# Patient Record
Sex: Male | Born: 1945 | Race: White | Hispanic: No | Marital: Married | Smoking: Former smoker
Health system: Southern US, Community
[De-identification: ages and names within clinical notes are randomized; demographics above are authoritative.]

## PROBLEM LIST (undated history)

## (undated) DIAGNOSIS — E785 Hyperlipidemia, unspecified: Secondary | ICD-10-CM

## (undated) DIAGNOSIS — N200 Calculus of kidney: Secondary | ICD-10-CM

## (undated) DIAGNOSIS — Z87442 Personal history of urinary calculi: Secondary | ICD-10-CM

## (undated) DIAGNOSIS — F32A Depression, unspecified: Secondary | ICD-10-CM

## (undated) DIAGNOSIS — F419 Anxiety disorder, unspecified: Secondary | ICD-10-CM

## (undated) HISTORY — PX: HEMORRHOID SURGERY: SHX153

## (undated) HISTORY — PX: VARICOSE VEIN SURGERY: SHX832

---

## 2007-10-27 DIAGNOSIS — R7301 Impaired fasting glucose: Secondary | ICD-10-CM | POA: Insufficient documentation

## 2008-05-13 DIAGNOSIS — M5030 Other cervical disc degeneration, unspecified cervical region: Secondary | ICD-10-CM

## 2008-05-13 HISTORY — DX: Other cervical disc degeneration, unspecified cervical region: M50.30

## 2009-11-20 DIAGNOSIS — E782 Mixed hyperlipidemia: Secondary | ICD-10-CM | POA: Insufficient documentation

## 2009-11-20 DIAGNOSIS — E785 Hyperlipidemia, unspecified: Secondary | ICD-10-CM

## 2009-11-20 HISTORY — DX: Hyperlipidemia, unspecified: E78.5

## 2011-05-22 DIAGNOSIS — N200 Calculus of kidney: Secondary | ICD-10-CM

## 2011-05-22 HISTORY — DX: Calculus of kidney: N20.0

## 2012-06-09 DIAGNOSIS — I4510 Unspecified right bundle-branch block: Secondary | ICD-10-CM

## 2012-06-09 HISTORY — DX: Unspecified right bundle-branch block: I45.10

## 2016-02-01 ENCOUNTER — Encounter: Payer: Self-pay | Admitting: *Deleted

## 2016-02-01 ENCOUNTER — Emergency Department
Admission: EM | Admit: 2016-02-01 | Discharge: 2016-02-01 | Disposition: A | Discharge status: Home / Self Care | Payer: Medicare Other | Source: Home / Self Care | Attending: Family Medicine | Admitting: Family Medicine

## 2016-02-01 DIAGNOSIS — W5501XA Bitten by cat, initial encounter: Secondary | ICD-10-CM | POA: Diagnosis not present

## 2016-02-01 DIAGNOSIS — Z23 Encounter for immunization: Secondary | ICD-10-CM

## 2016-02-01 DIAGNOSIS — S0195XA Open bite of unspecified part of head, initial encounter: Secondary | ICD-10-CM | POA: Diagnosis not present

## 2016-02-01 DIAGNOSIS — S0185XA Open bite of other part of head, initial encounter: Secondary | ICD-10-CM

## 2016-02-01 HISTORY — DX: Calculus of kidney: N20.0

## 2016-02-01 HISTORY — DX: Hyperlipidemia, unspecified: E78.5

## 2016-02-01 MED ORDER — TETANUS-DIPHTH-ACELL PERTUSSIS 5-2.5-18.5 LF-MCG/0.5 IM SUSP
0.5000 mL | Freq: Once | INTRAMUSCULAR | Status: AC
  Administered 2016-02-01: 0.5 mL via INTRAMUSCULAR

## 2016-02-01 MED ORDER — AMOXICILLIN-POT CLAVULANATE 875-125 MG PO TABS: 1.0000 | ORAL_TABLET | Freq: Two times a day (BID) | ORAL | Status: DC

## 2016-02-01 NOTE — ED Notes (Addendum)
Pt reports his own cat bit his left cheek this morning while he was petting her. He cleaned with alcohol and applied neosporin. Denies pain. Rabies vaccine proof provided. TDaP 05/24/2009

## 2016-02-01 NOTE — Discharge Instructions (Signed)
Please keep wound clean with warm water and soap.  You may apply over the counter antibiotic ointment.  Be sure to take your antibiotics as prescribed to help prevent infection.  Please follow up with your primary care provider or return to urgent care if signs of infection including increased pain, redness, swelling, draining pus, or fever develops.  See below for further instructions on animal bites.    Animal Bite Animal bites can range from mild to serious. An animal bite can result in a scratch on the skin, a deep open cut, a puncture of the skin, a crush injury, or tearing away of the skin or a body part. A small bite from a house pet will usually not cause serious problems. However, some animal bites can become infected or injure a bone or other tissue.  Bites from certain animals can be more dangerous because of the risk of spreading rabies, which is a serious viral infection. This risk is higher with bites from stray animals or wild animals, such as raccoons, foxes, skunks, and bats. Dogs are responsible for most animal bites. Children are bitten more often than adults. SYMPTOMS  Common symptoms of an animal bite include:   Pain.   Bleeding.   Swelling.   Bruising.  DIAGNOSIS  This condition may be diagnosed based on a physical exam and medical history. Your health care provider will examine the wound and ask for details about the animal and how the bite happened. You may also have tests, such as:   Blood tests to check for infection or to determine if surgery is needed.  X-rays to check for damage to bones or joints.  Culture test. This uses a sample of fluid from the wound to check for infection. TREATMENT  Treatment varies depending on the location and type of animal bite and your medical history. Treatment may include:   Wound care. This often includes cleaning the wound, flushing the wound with saline solution, and applying a bandage (dressing). Sometimes, the wound is  left open to heal because of the high risk of infection. However, in some cases, the wound may be closed with stitches (sutures), staples, skin glue, or adhesive strips.   Antibiotic medicine.   Tetanus shot.   Rabies treatment if the animal could have rabies.  In some cases, bites that have become infected may require IV antibiotics and surgical treatment in the hospital.  [place]  Follow instructions from your health care provider about how to take care of your wound. Make sure you:  Wash your hands with soap and water before you change your dressing. If soap and water are not available, use hand sanitizer.  Change your dressing as told by your health care provider.  Leave sutures, skin glue, or adhesive strips in place. These skin closures may need to be in place for 2 weeks or longer. If adhesive strip edges start to loosen and curl up, you may trim the loose edges. Do not remove adhesive strips completely unless your health care provider tells you to do that.  Check your wound every day for signs of infection. Watch for:   Increasing redness, swelling, or pain.   Fluid, blood, or pus.  General Instructions  Take or apply over-the-counter and prescription medicines only as told by your health care provider.   If you were prescribed an antibiotic, take or apply it as told by your health care provider. Do not stop using the antibiotic even if your  condition improves.   Keep the injured area raised (elevated) above the level of your heart while you are sitting or lying down, if this is possible.   If directed, apply ice to the injured area.   Put ice in a plastic bag.   Place a towel between your skin and the bag.   Leave the ice on for 20 minutes, 2-3 times per day.   Keep all follow-up visits as told by your health care provider. This is important.  SEEK MEDICAL CARE IF:  You have increasing redness, swelling, or pain at  the site of your wound.   You have a general feeling of sickness (malaise).   You feel nauseous or you vomit.   You have pain that does not get better.  SEEK IMMEDIATE MEDICAL CARE IF:  You have a red streak extending away from your wound.   You have fluid, blood, or pus coming from your wound.   You have a fever or chills.   You have trouble moving your injured area.   You have numbness or tingling extending beyond the wound.   This information is not intended to replace advice given to you by your health care provider. Make sure you discuss any questions you have with your health care provider.   Document Released: 07/09/2011 Document Revised: 07/12/2015 Document Reviewed: 03/08/2015 Elsevier Interactive Patient Education [employer].

## 2016-02-01 NOTE — ED Provider Notes (Signed)
CSN: [id number]     Arrival date & time 02/01/16  [id number] History   First MD Initiated Contact with Patient 02/01/16 [phone]     Chief Complaint  Patient presents with  . Animal Bite   (Consider location/radiation/quality/duration/timing/severity/associated sxs/prior Treatment) HPI The pt is a 70yo male presenting to [facility] with c/o cat bite to the Left side of his face that occurred this morning just PTA.  Pt states he was petting his cat when it bit him.  Cat is UTD on rabies vaccine.  Pt's last tetanus was in 2010.  Pt cleaned the wound with peroxide and applied antibiotic ointment PTA. Bleeding controlled PTA. Reports minimal pain. No other injuries. He is not on blood thinners.   Past Medical History  Diagnosis Date  . Hyperlipidemia   . Kidney stone    Past Surgical History  Procedure Laterality Date  . Hemorrhoid surgery     History reviewed. No pertinent family history. Social History  Substance Use Topics  . Smoking status: Former Research scientist (life sciences)  . Smokeless tobacco: Never Used  . Alcohol Use: No    Review of Systems  Musculoskeletal: Negative for myalgias and arthralgias.  Skin: Positive for wound. Negative for color change.  Neurological: Negative for weakness and numbness.    Allergies  Review of patient's allergies indicates no known allergies.  Home Medications   Prior to Admission medications   Medication Sig Start Date End Date Taking? Authorizing Provider  FLUoxetine (PROZAC) 10 MG tablet Take 10 mg by mouth daily.   Yes Historical Provider, MD  simvastatin (ZOCOR) 40 MG tablet Take 40 mg by mouth daily.   Yes Historical Provider, MD  Tamsulosin HCl (FLOMAX PO) Take by mouth. PRN KIDNEY STONES   Yes Historical Provider, MD  amoxicillin-clavulanate (AUGMENTIN) 875-125 MG tablet Take 1 tablet by mouth 2 (two) times daily. For 5 days 02/01/16   Noland Fordyce, PA-C   Meds Ordered and Administered this Visit   Medications  Tdap (BOOSTRIX) injection 0.5 mL (0.5 mLs  Intramuscular Given 02/01/16 0904)    BP 121/73 mmHg  Pulse 57  Temp(Src) 97.7 F (36.5 C) (Oral)  Ht 5\' 10"  (1.778 m)  Wt 215 lb (97.523 kg)  BMI 30.85 kg/m2  SpO2 96% No data found.   Physical Exam  Constitutional: He is oriented to person, place, and time. He appears well-developed and well-nourished.  HENT:  Head: Normocephalic. Head is with laceration.    2cm superficial laceration to Left side of face. Bleeding controlled. Antibiotic ointment overtop wound. No foreign bodies seen or palpated.   Eyes: EOM are normal.  Neck: Normal range of motion.  Cardiovascular: Normal rate.   Pulmonary/Chest: Effort normal.  Musculoskeletal: Normal range of motion.  Neurological: He is alert and oriented to person, place, and time.  Skin: Skin is warm and dry.  Psychiatric: He has a normal mood and affect. His behavior is normal.  Nursing note and vitals reviewed.   ED Course  Procedures (including critical care time)  Labs Review Labs Reviewed - No data to display  Imaging Review No results found.  Wound appears to be well cleaned and pt had already applied antibiotic ointment. No indication for wound closure as bite mark is superficial.   MDM   1. Cat bite of face, initial encounter     Cat bite to Left side of face. Tdap updated in UC.   Due to nature of laceration, will prescribe Augmentin for 5 days. Wound care instructions provided. F/u with  PCP or [facility] if signs of infection or wound not improving in 4-5 days. Patient verbalized understanding and agreement with treatment plan.     Noland Fordyce, PA-C 02/01/16 [phone]

## 2016-02-04 ENCOUNTER — Telehealth: Payer: Self-pay | Admitting: Emergency Medicine

## 2017-05-14 LAB — HM COLONOSCOPY

## 2018-03-02 ENCOUNTER — Emergency Department
Admission: EM | Admit: 2018-03-02 | Discharge: 2018-03-02 | Disposition: A | Discharge status: Home / Self Care | Payer: Medicare HMO | Source: Home / Self Care

## 2018-03-02 ENCOUNTER — Emergency Department (INDEPENDENT_AMBULATORY_CARE_PROVIDER_SITE_OTHER): Payer: Medicare HMO

## 2018-03-02 ENCOUNTER — Other Ambulatory Visit: Payer: Self-pay

## 2018-03-02 ENCOUNTER — Encounter: Payer: Self-pay | Admitting: *Deleted

## 2018-03-02 DIAGNOSIS — M25511 Pain in right shoulder: Secondary | ICD-10-CM | POA: Diagnosis not present

## 2018-03-02 DIAGNOSIS — S46001A Unspecified injury of muscle(s) and tendon(s) of the rotator cuff of right shoulder, initial encounter: Secondary | ICD-10-CM | POA: Diagnosis not present

## 2018-03-02 MED ORDER — HYDROCODONE-ACETAMINOPHEN 5-325 MG PO TABS: 1.0000 | ORAL_TABLET | ORAL | 0 refills | Status: DC | PRN

## 2018-03-02 NOTE — ED Provider Notes (Signed)
[facility]    CSN: [id number] Arrival date & time: 03/02/18  0737     History   Chief Complaint Chief Complaint  Patient presents with  . Arm Injury    HPI Joshua [name] is a 72 y.o. male.   The history is provided by the patient. No language interpreter was used.  Arm Injury  Location:  Shoulder and arm Shoulder location:  R shoulder Arm location:  R arm Injury: yes   Time since incident:  2 days Mechanism of injury comment:  Pt was pulling off his shirt and heard a pop and a tearing feeling Pain details:    Quality:  Aching   Radiates to:  Does not radiate   Severity:  Moderate   Onset quality:  Gradual   Duration:  2 days   Timing:  Constant   Progression:  Worsening Dislocation: no   Foreign body present:  No foreign bodies Prior injury to area:  No Relieved by:  Nothing Worsened by:  Nothing Ineffective treatments:  None tried Risk factors: no recent illness     Past Medical History:  Diagnosis Date  . Hyperlipidemia   . Kidney stone     There are no active problems to display for this patient.   Past Surgical History:  Procedure Laterality Date  . HEMORRHOID SURGERY         Home Medications    Prior to Admission medications   Medication Sig Start Date End Date Taking? Authorizing Provider  FLUoxetine (PROZAC) 10 MG tablet Take 10 mg by mouth daily.    [provider]  simvastatin (ZOCOR) 40 MG tablet Take 40 mg by mouth daily.    [provider]  Tamsulosin HCl (FLOMAX PO) Take by mouth. PRN KIDNEY STONES    [provider]    Family History History reviewed. No pertinent family history.  Social History Social History   Tobacco Use  . Smoking status: Former Research scientist (life sciences)  . Smokeless tobacco: Never Used  Substance Use Topics  . Alcohol use: No  . Drug use: No     Allergies   Patient has no known allergies.   Review of Systems Review of Systems  All other systems reviewed and are  negative.    Physical Exam Triage Vital Signs ED Triage Vitals  Enc Vitals Group     BP 03/02/18 0932 (!) 143/86     Pulse Rate 03/02/18 0932 (!) 57     Resp 03/02/18 0932 14     Temp --      Temp src --      SpO2 03/02/18 0932 97 %     Weight 03/02/18 0933 230 lb (104.3 kg)     Height --      Head Circumference --      Peak Flow --      Pain Score 03/02/18 0933 9     Pain Loc --      Pain Edu? --      Excl. in [place]? --    No data found.  Updated Vital Signs BP (!) 143/86 (BP Location: Right Arm)   Pulse (!) 57   Resp 14   Wt 230 lb (104.3 kg)   SpO2 97%   BMI 33.00 kg/m   Visual Acuity Right Eye Distance:   Left Eye Distance:   Bilateral Distance:    Right Eye Near:   Left Eye Near:    Bilateral Near:     Physical Exam  Constitutional: He appears well-developed and well-nourished.  HENT:  Head: Normocephalic.  Cardiovascular: Normal rate.  Pulmonary/Chest: Effort normal.  Musculoskeletal: He exhibits tenderness.  Pain with moving arm upward, pain with abduction,   Tender triceps area   Neurological: He is alert.  Skin: Skin is warm.  Psychiatric: He has a normal mood and affect.  Nursing note and vitals reviewed.    UC Treatments / Results  Labs (all labs ordered are listed, but only abnormal results are displayed) Labs Reviewed - No data to display  EKG None Radiology Dg Shoulder Right  Result Date: 03/02/2018 CLINICAL DATA:  Injury, pain.  Felt a pop. EXAM: RIGHT SHOULDER - 2+ VIEW COMPARISON:  Right humerus series performed today. 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 Small subacromial spur. Glenohumeral joint is maintained. No fracture, subluxation or dislocation. 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 No acute bony abnormality. Electronically Signed   By: Rolm Baptise M.D.   On: 03/02/2018 10:33   Dg Humerus Right  Result Date: 03/02/2018 CLINICAL DATA:  Right shoulder injury, pain.  Heard pop.  EXAM: RIGHT HUMERUS - 2+ VIEW COMPARISON:  Right shoulder series performed today. FINDINGS: There is no evidence of fracture or other focal bone lesions. Soft tissues are unremarkable. IMPRESSION: Negative. Electronically Signed   By: Rolm Baptise M.D.   On: 03/02/2018 10:34    Procedures Procedures (including critical care time)  Medications Ordered in UC Medications - No data to display   Initial Impression / Assessment and Plan / UC Course  I have reviewed the triage vital signs and the nursing notes.  Pertinent labs & imaging results that were available during my care of the patient were reviewed by me and considered in my medical decision making (see chart for details).     MDM  I suspect muscle tear.  Pt placed in a  Sling. Pt scheduled to see Sports medicine MD for evaltuation  ICe, and ibuprofen  Final Clinical Impressions(s) / UC Diagnoses   Final diagnoses:  Injury of right rotator cuff, initial encounter    ED Discharge Orders    None    Sling Ibuprofen   /avs   Controlled Substance Prescriptions [place] Controlled Substance Registry consulted? Not Applicable   Fransico Meadow, [place] 03/02/18 1054

## 2018-03-02 NOTE — ED Triage Notes (Signed)
Patient c/o pulling his shirt off over his head 2 days ago and felt/hears a pop. Pain ever since. Taken tylenol, IBF and used ice without much relief.

## 2018-03-03 ENCOUNTER — Ambulatory Visit (INDEPENDENT_AMBULATORY_CARE_PROVIDER_SITE_OTHER): Payer: Medicare HMO | Admitting: Family Medicine

## 2018-03-03 ENCOUNTER — Encounter: Payer: Self-pay | Admitting: Family Medicine

## 2018-03-03 VITALS — BP 133/74 | HR 67 | Ht 71.0 in | Wt 232.0 lb

## 2018-03-03 DIAGNOSIS — M7581 Other shoulder lesions, right shoulder: Secondary | ICD-10-CM | POA: Diagnosis not present

## 2018-03-03 DIAGNOSIS — Z860101 Personal history of adenomatous and serrated colon polyps: Secondary | ICD-10-CM

## 2018-03-03 DIAGNOSIS — Z86010 Personal history of colonic polyps: Secondary | ICD-10-CM

## 2018-03-03 HISTORY — DX: Personal history of adenomatous and serrated colon polyps: Z86.0101

## 2018-03-03 HISTORY — DX: Personal history of colonic polyps: Z86.010

## 2018-03-03 NOTE — Patient Instructions (Addendum)
Thank you for coming in today. Call or go to the ER if you develop a large red swollen joint with extreme pain or oozing puss.   Attend PT.  Recheck in 6 weeks.  Return sooner if needed.    Shoulder Impingement Syndrome Rehab Ask your health care provider which exercises are safe for you. Do exercises exactly as told by your health care provider and adjust them as directed. It is normal to feel mild stretching, pulling, tightness, or discomfort as you do these exercises, but you should stop right away if you feel sudden pain or your pain gets worse.Do not begin these exercises until told by your health care provider. Stretching and range of motion exercise This exercise warms up your muscles and joints and improves the movement and flexibility of your shoulder. This exercise also helps to relieve pain and stiffness. Exercise A: Passive horizontal adduction  1. Sit or stand and pull your left / right elbow across your chest, toward your other shoulder. Stop when you feel a gentle stretch in the back of your shoulder and upper arm. ? Keep your arm at shoulder height. ? Keep your arm as close to your body as you comfortably can. 2. Hold for __________ seconds. 3. Slowly return to the starting position. Repeat __________ times. Complete this exercise __________ times a day. Strengthening exercises These exercises build strength and endurance in your shoulder. Endurance is the ability to use your muscles for a long time, even after they get tired. Exercise B: External rotation, isometric 1. Stand or sit in a doorway, facing the door frame. 2. Bend your left / right elbow and place the back of your wrist against the door frame. Only your wrist should be touching the frame. Keep your upper arm at your side. 3. Gently press your wrist against the door frame, as if you are trying to push your arm away from your abdomen. ? Avoid shrugging your shoulder while you press your hand against the door  frame. Keep your shoulder blade tucked down toward the middle of your back. 4. Hold for __________ seconds. 5. Slowly release the tension, and relax your muscles completely before you do the exercise again. Repeat __________ times. Complete this exercise __________ times a day. Exercise C: Internal rotation, isometric  1. Stand or sit in a doorway, facing the door frame. 2. Bend your left / right elbow and place the inside of your wrist against the door frame. Only your wrist should be touching the frame. Keep your upper arm at your side. 3. Gently press your wrist against the door frame, as if you are trying to push your arm toward your abdomen. ? Avoid shrugging your shoulder while you press your hand against the door frame. Keep your shoulder blade tucked down toward the middle of your back. 4. Hold for __________ seconds. 5. Slowly release the tension, and relax your muscles completely before you do the exercise again. Repeat __________ times. Complete this exercise __________ times a day. Exercise D: Scapular protraction, supine  1. Lie on your back on a firm surface. Hold a __________ weight in your left / right hand. 2. Raise your left / right arm straight into the air so your hand is directly above your shoulder joint. 3. Push the weight into the air so your shoulder lifts off of the surface that you are lying on. Do not move your head, neck, or back. 4. Hold for __________ seconds. 5. Slowly return to the starting position.  Let your muscles relax completely before you repeat this exercise. Repeat __________ times. Complete this exercise __________ times a day. Exercise E: Scapular retraction  1. Sit in a stable chair without armrests, or stand. 2. Secure an exercise band to a stable object in front of you so the band is at shoulder height. 3. Hold one end of the exercise band in each hand. Your palms should face down. 4. Squeeze your shoulder blades together and move your elbows  slightly behind you. Do not shrug your shoulders while you do this. 5. Hold for __________ seconds. 6. Slowly return to the starting position. Repeat __________ times. Complete this exercise __________ times a day. Exercise F: Shoulder extension  1. Sit in a stable chair without armrests, or stand. 2. Secure an exercise band to a stable object in front of you where the band is above shoulder height. 3. Hold one end of the exercise band in each hand. 4. Straighten your elbows and lift your hands up to shoulder height. 5. Squeeze your shoulder blades together and pull your hands down to the sides of your thighs. Stop when your hands are straight down by your sides. Do not let your hands go behind your body. 6. Hold for __________ seconds. 7. Slowly return to the starting position. Repeat __________ times. Complete this exercise __________ times a day. This information is not intended to replace advice given to you by your health care provider. Make sure you discuss any questions you have with your health care provider. Document Released: 10/21/2005 Document Revised: 06/27/2016 Document Reviewed: 09/23/2015 Elsevier Interactive Patient Education  [employer].

## 2018-03-03 NOTE — Progress Notes (Signed)
Subjective:    I'm seeing this patient as a consultation for:  Alyse Low PA-C  CC: Right shoulder pain  HPI: Joshua [name] notes right shoulder pain.  The pain is present in the right upper arm for the last several days.  The pain occurred after he had to start a pull string gasoline engine multiple times over the weekend.  He noted some pain but denies any particular injury.  That evening when he went to take a shower and pull his shirt over his head he felt a pop in his right shoulder.  He notes pain in the right lateral upper arm.  The pain is worse with overhead motion and reaching back and at bedtime.  He denies significant pain weakness or numbness radiating to the right hand.  He was seen in urgent care yesterday where x-rays were largely unremarkable.  He was prescribed Norco which helped a little.  He receives his care for a primary care provider at the [place].  Past medical history, Surgical history, Family history not pertinant except as noted below, Social history, Allergies, and medications have been entered into the medical record, reviewed, and no changes needed.   Review of Systems: No headache, visual changes, nausea, vomiting, diarrhea, constipation, dizziness, abdominal pain, skin rash, fevers, chills, night sweats, weight loss, swollen lymph nodes, body aches, joint swelling, muscle aches, chest pain, shortness of breath, mood changes, visual or auditory hallucinations.   Objective:    Vitals:   03/03/18 0907  BP: 133/74  Pulse: 67   General: Well Developed, well nourished, and in no acute distress.  Neuro/Psych: Alert and oriented x3, extra-ocular muscles intact, able to move all 4 extremities, sensation grossly intact. Skin: Warm and dry, no rashes noted.  Respiratory: Not using accessory muscles, speaking in full sentences, trachea midline.  Cardiovascular: Pulses palpable, no extremity edema. Abdomen: Does not appear distended. MSK:  C-spine: Nontender to midline normal  neck motion. Right shoulder normal-appearing mildly tender at the [facility] joint. Range of motion is intact forward flexion and external rotation. Patient has limited active abduction beyond 100 degrees limited by pain.  Passive range of motion is full. Internal rotation limited to the lumbar spine compared to the thoracic spine contralateral left shoulder. Strength is diminished in abduction and external rotation 4/5 compared to 5/5 left.  Normal internal rotational strength. Positive Hawkins and Neer's test. Positive empty can test. Negative Yergason's and speeds test. Pulses capillary refill and sensation are intact bilateral upper extremities.  Left shoulder normal-appearing nontender full motion normal strength negative impingement testing.  Limited musculoskeletal ultrasound of the left shoulder AC joint mild effusion mild degeneration present. Biceps tendon intact and groove. Subscapularis tendon intact appearing Supraspinatus tendon intact without full-thickness tear present.  Mild increased bursa thickening present. Infraspinatus tendon intact Normal bony structures. Impression subacromial bursitis without full-thickness tear present.  .Procedure: Real-time Ultrasound Guided Injection of left shoulder  Device: GE Logiq E   Images permanently stored and available for review in the ultrasound unit. Verbal informed consent obtained.  Discussed risks and benefits of procedure. Warned about infection bleeding damage to structures skin hypopigmentation and fat atrophy among others. Patient expresses understanding and agreement Time-out conducted.   Noted no overlying erythema, induration, or other signs of local infection.   Skin prepped in a sterile fashion.   Local anesthesia: Topical Ethyl chloride.   With sterile technique and under real time ultrasound guidance:  40mg  kenalog and 59ml marcaine injected easily.   Completed without difficulty  Pain partially  resolved suggesting  accurate placement of the medication.   Advised to call if fevers/chills, erythema, induration, drainage, or persistent bleeding.   Images permanently stored and available for review in the ultrasound unit.  Impression: Technically successful ultrasound guided injection.      No results found for this or any previous visit (from the past 24 hour(s)). Dg Shoulder Right  Result Date: 03/02/2018 CLINICAL DATA:  Injury, pain.  Felt a pop. EXAM: RIGHT SHOULDER - 2+ VIEW COMPARISON:  Right humerus series performed today. 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 Small subacromial spur. Glenohumeral joint is maintained. No fracture, subluxation or dislocation. 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 No acute bony abnormality. Electronically Signed   By: Rolm Baptise M.D.   On: 03/02/2018 10:33   Dg Humerus Right  Result Date: 03/02/2018 CLINICAL DATA:  Right shoulder injury, pain.  Heard pop. EXAM: RIGHT HUMERUS - 2+ VIEW COMPARISON:  Right shoulder series performed today. FINDINGS: There is no evidence of fracture or other focal bone lesions. Soft tissues are unremarkable. IMPRESSION: Negative. Electronically Signed   By: Rolm Baptise M.D.   On: 03/02/2018 10:34    Impression and Recommendations:    Assessment and Plan: 72 y.o. male with right shoulder pain concerning for subacromial bursitis versus partial-thickness rotator cuff tear.  No obvious tear seen on today's limited ultrasound.  Patient had partial response to ultrasound-guided subacromial injection.  Plan for physical therapy and injection as noted above.  Recheck in 6 weeks.  If not better next step would be MRI for surgical planning..   Orders Placed This Encounter  Procedures  . Ambulatory referral to Physical Therapy    Referral Priority:   Routine    Referral Type:   Physical Medicine    Referral Reason:   Specialty Services Required    Requested Specialty:   Physical Therapy   No  orders of the defined types were placed in this encounter.   Discussed warning signs or symptoms. Please see discharge instructions. Patient expresses understanding.   CC:  Dr Nuala Alpha at [facility]

## 2018-03-09 ENCOUNTER — Ambulatory Visit: Payer: Medicare HMO | Admitting: Rehabilitative and Restorative Service Providers"

## 2018-03-09 DIAGNOSIS — M25511 Pain in right shoulder: Secondary | ICD-10-CM

## 2018-03-09 DIAGNOSIS — R293 Abnormal posture: Secondary | ICD-10-CM | POA: Diagnosis not present

## 2018-03-09 DIAGNOSIS — R29898 Other symptoms and signs involving the musculoskeletal system: Secondary | ICD-10-CM

## 2018-03-09 NOTE — Patient Instructions (Signed)
Axial Extension (Chin Tuck)    Pull chin in and lengthen back of neck. Hold __5__ seconds while counting out loud. Repeat __10__ times. Do __several__ sessions per day.  Shoulder Blade Squeeze   Can use swim noodle  Rotate shoulders back, then squeeze shoulder blades down and back. Hold 10 sec Repeat __10__ times. Do __several __ sessions per day.  Upper Back Strength: Lower Trapezius / Rotator Cuff " L's "     Arms in waitress pose, palms up. Press hands back and slide shoulder blades down. Hold for __5__ seconds. Repeat _10___ times. 1-2 times per day.    Scapular Retraction: Elbow Flexion (Standing)  "W's"     With elbows bent to 90, pinch shoulder blades together and rotate arms out, keeping elbows bent. Repeat __10__ times per set. Do __1-2__ sets per session. Do _several ___ sessions per day.  ELBOW: Biceps - Standing    Standing in doorway, place one hand on wall, elbow straight. Lean forward. Hold __20-30_ seconds. _3__ reps per set, _2-3__ sets per day  Scapula Adduction With Pectoralis Stretch: Low - Standing   Shoulders at 45 hands even with shoulders, keeping weight through legs, shift weight forward until you feel pull or stretch through the front of your chest. Hold _30__ seconds. Do _3__ times, _2-4__ times per day.   Scapula Adduction With Pectoralis Stretch: Mid-Range - Standing   Shoulders at 90 elbows even with shoulders, keeping weight through legs, shift weight forward until you feel pull or strength through the front of your chest. Hold __30_ seconds. Do _3__ times, __2-4_ times per day.   Scapula Adduction With Pectoralis Stretch: High - Standing   Shoulders at 120 hands up high on the doorway, keeping weight on feet, shift weight forward until you feel pull or stretch through the front of your chest. Hold _30__ seconds. Do _3__ times, _2-3__ times per day.  TENS UNIT: This is helpful for muscle pain and spasm.   Search and  Purchase a TENS 7000 2nd edition at www.tenspros.com. It should be less than $30.     TENS unit instructions: Do not shower or bathe with the unit on Turn the unit off before removing electrodes or batteries If the electrodes lose stickiness add a drop of water to the electrodes after they are disconnected from the unit and place on plastic sheet. If you continued to have difficulty, call the TENS unit company to purchase more electrodes. Do not apply lotion on the skin area prior to use. Make sure the skin is clean and dry as this will help prolong the life of the electrodes. After use, always check skin for unusual red areas, rash or other skin difficulties. If there are any skin problems, does not apply electrodes to the same area. Never remove the electrodes from the unit by pulling the wires. Do not use the TENS unit or electrodes other than as directed. Do not change electrode placement without consultating your therapist or physician. Keep 2 fingers with between each electrode.   Self massage using a 4 inch plastic ball   [facility] at [place], [place] [address]  [phone] (office) [phone] (fax)

## 2018-03-09 NOTE — Therapy (Signed)
[place], [place], [address] Phone: [phone]   Fax:  [phone]  Physical Therapy Evaluation  Patient Details  Name: Joshua [name] MRN: [id number] Date of Birth: [birth year] Referring Provider: Dr [name]    Encounter Date: 03/09/2018  PT End of Session - 03/09/18 1419    Visit Number  1    Number of Visits  12    Date for PT Re-Evaluation  04/20/18    PT Start Time  1104    PT Stop Time  1210    PT Time Calculation (min)  66 min    Activity Tolerance  Patient tolerated treatment well       Past Medical History:  Diagnosis Date  . Hyperlipidemia   . Kidney stone     Past Surgical History:  Procedure Laterality Date  . HEMORRHOID SURGERY      There were no vitals filed for this visit.   Subjective Assessment - 03/09/18 1113    Subjective  Patient reports that he was starting an engine for a pressure washer - pulling to crank the machine. He noticed pain in the Rt shoulder after he used the pressure washer then rested then he was pulling his shirt over his head when he felt a pop.He has had pain in the Rt shoudler since that time. He was seen by MD and received injection with minimal help. He continues to have pain in the Rt shoulder.     Diagnostic tests  xrays (-); [place] tendonitis     Patient Stated Goals  get rid of pain     Currently in Pain?  Yes    Pain Score  7     Pain Location  Shoulder    Pain Orientation  Right    Pain Descriptors / Indicators  Sharp;Nagging    Pain Radiating Towards  in mid deltoid area     Pain Onset  1 to 4 weeks ago    Pain Frequency  Constant    Aggravating Factors   driving; reaching; lifting arm; sitting in recliner prolonged periods of time     Pain Relieving Factors  nothing          [facility] PT Assessment - 03/09/18 0001      Assessment   Medical Diagnosis  Rt Rotator cuff tendonitis     Referring Provider  Dr [name]     Onset  Date/Surgical Date  03/01/18    Hand Dominance  Right    Next MD Visit  04/09/18    Prior Therapy  none      Precautions   Precautions  None      Balance Screen   Has the patient fallen in the past 6 months  No    Has the patient had a decrease in activity level because of a fear of falling?   No    Is the patient reluctant to leave their home because of a fear of falling?   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     Leisure  yard work; reading; TV; fixing things        Observation/Other Assessments   Focus on Therapeutic Outcomes (FOTO)   45% limitation       Sensation   Additional Comments  WNL's per  pt report       Posture/Postural Control   Posture Comments  head forward; shoudlers rounded and elevated head of the humerus anterior in orientation; scapula abducted and rotated along the thoracic spine       AROM   Right/Left Shoulder  -- paiin with all Rt shd motioints > w/ abd/IR/ER     Right Shoulder Extension  53 Degrees    Right Shoulder Flexion  140 Degrees    Right Shoulder ABduction  143 Degrees    Right Shoulder Internal Rotation  27 Degrees    Right Shoulder External Rotation  90 Degrees    Left Shoulder Extension  50 Degrees    Left Shoulder Flexion  139 Degrees    Left Shoulder ABduction  140 Degrees    Left Shoulder Internal Rotation  43 Degrees    Left Shoulder External Rotation  93 Degrees      Strength   Overall Strength Comments  5/5 Lt UE; Rt except shd flex, abd. ER 4+/5 with pain       Palpation   Spinal mobility  hypomobile lower cervical and thoracic spine with CPA mobs    Palpation comment  muscular tightness Rt > Lt pecs; upper trap; leveator; biceps; deltoid; leveator; teres                 Objective measurements completed on examination: See above findings.      [place] Adult PT Treatment/Exercise - 03/09/18  0001      Self-Care   Self-Care  -- education re sitting postures/modifications       Therapeutic Activites    Therapeutic Activities  -- myofacial ball release work standing       Neuro Re-ed    Neuro Re-ed Details   postural correction with focus on chest lift/scapular retraction      Shoulder Exercises: Standing   Other Standing Exercises  axial extension 10 sec x 5; scap squeeze 10 sec x 5; L's x10; W's x 10       Shoulder Exercises: Stretch   Other Shoulder Stretches  3 way doorway stretch 30 sec x 2 each position     Other Shoulder Stretches  biceps stretch at table 30 sec x 2 Rt       Moist Heat Therapy   Number Minutes Moist Heat  20 Minutes    Moist Heat Location  Shoulder Rt      Electrical Stimulation   Electrical Stimulation Location  Rt shoulder girdle to deltoid    Electrical Stimulation Action  IFC    Electrical Stimulation Parameters  to tolerance     Electrical Stimulation Goals  Pain;Tone             PT Education - 03/09/18 1154    Education provided  Yes    Education Details  HEP myofacail ball release work TENS     Person(s) Educated  Patient    Methods  Explanation;Demonstration;Tactile cues;Verbal cues;Handout    Comprehension  Verbalized understanding;Returned demonstration;Verbal cues required          PT Long Term Goals - 03/09/18 1424      PT LONG TERM GOAL #1   Title  Improve postue and alignment with patient to demonstrate good upright posture with posterior shoulder girdle engaged. 04/20/18    Time  6    Period  Weeks    Status  New      PT LONG TERM GOAL #2   Title  Full pain  free ROM Rt shoulder to enable patient to return to normal functional activities 04/20/18    Time  6    Period  Weeks    Status  New      PT LONG TERM GOAL #3   Title  Decrease frequency, intensity and duration of Rt shoudler pain by 50-75% allowing patient to return to normal functional activities 04/20/18    Time  6    Period  Weeks    Status  New       PT LONG TERM GOAL #4   Title  Independent in HEP 04/20/18    Time  6    Period  Weeks    Status  New      PT LONG TERM GOAL #5   Title  Improve FOTO to </= 30% limitation 04/20/18    Time  6    Period  Weeks    Status  New             Plan - 03/09/18 1419    Clinical Impression Statement  Patient presents with injury to Rt shoulder 03/01/18 when pulling a starter for a power washer. He has had continued pain since the afternoon of the injury with no response to meds; injection or rest. Patient has poor posture and alignment; limited shoulder ROM bilat; pain with resistive testing Rt shoulder; muscular tightness to palpation Rt shoulder girdle; pain at rest; limited functional activitie level. patient will benefit from PT to address problems identified.     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; sedentary posture with poor positions for rest     Clinical Decision Making  Low    Rehab Potential  Good    PT Frequency  2x / week    PT Duration  6 weeks    PT Treatment/Interventions  Patient/family education;ADLs/Self Care Home Management;Cryotherapy;Electrical Stimulation;Iontophoresis 4mg /ml Dexamethasone;Moist Heat;Ultrasound;Dry needling;Manual techniques;Neuromuscular re-education;Therapeutic activities;Therapeutic exercise    PT Next Visit Plan  review HEP; add manual release work through Rt shoulder girdle; snow angel; scapulae down and back; modalities as indicated     Consulted and Agree with Plan of Care  Patient       Patient will benefit from skilled therapeutic intervention in order to improve the following deficits and impairments:  Postural dysfunction, Improper body mechanics, Pain, Increased fascial restricitons, Increased muscle spasms, Decreased strength, Decreased range of motion, Decreased activity tolerance  Visit Diagnosis: Acute pain of right shoulder - Plan: PT plan of  care cert/re-cert  Other symptoms and signs involving the musculoskeletal system - Plan: PT plan of care cert/re-cert  Abnormal posture - Plan: PT plan of care cert/re-cert     Problem List Patient Active Problem List   Diagnosis Date Noted  . History of adenomatous polyp of colon 03/03/2018  . RBBB 06/09/2012  . Nephrolithiasis 05/22/2011  . Other and unspecified hyperlipidemia 11/20/2009  . DDD (degenerative disc disease), cervical 05/13/2008  . Impaired fasting glucose 10/27/2007    Celyn Nilda Simmer PT, MPH  03/09/2018, 2:29 PM  [facility] [place], [place], [address] Phone: [phone]   Fax:  [phone]  Name: Joshua [name] MRN: [id number] Date of Birth: Jul 15, 1946

## 2018-03-13 ENCOUNTER — Ambulatory Visit (INDEPENDENT_AMBULATORY_CARE_PROVIDER_SITE_OTHER): Payer: Medicare HMO | Admitting: Physical Therapy

## 2018-03-13 DIAGNOSIS — M25511 Pain in right shoulder: Secondary | ICD-10-CM

## 2018-03-13 DIAGNOSIS — R29898 Other symptoms and signs involving the musculoskeletal system: Secondary | ICD-10-CM

## 2018-03-13 DIAGNOSIS — R293 Abnormal posture: Secondary | ICD-10-CM | POA: Diagnosis not present

## 2018-03-13 NOTE — Patient Instructions (Addendum)
Thoracic: Stretch - Lean Back (Chair)    Get ON TARGET. Sit on a low firm-backed chair, hands behind head. Elbows leading motion, lean back, arching upper body. Avoid undue pressure or quick stretching. Hold __5_ seconds. Repeat _3__ times. Do _2__ sessions per day.  [place]: Single Arm    Arms near sides, palms up. Press one arm lightly into floor, slide arm out to side and up alongside head. Keep contact with floor throughout motion. At maximal position, lengthen arm. Hold __3_ seconds. Relax. Repeat _5__ times. Slide arm back to start. Repeat with other arm.  Trunk: Rotation    Lie on back on firm, flat surface with knees bent. Keep head and shoulders flat, slowly lower knees to floor. May also do with legs straight. Lift one at a time up and across to touch floor. Hold __10__ seconds. Repeat __3__ times, alternating sides. Do __1__ sessions per day. CAUTION: Movement should be gentle, steady and slow.  Chest / Bicep Stretch    Lace fingers behind back and squeeze shoulder blades together. Slowly raise and straighten arms. Hold __3-5__ seconds. Repeat __3__ times per set.   * Remember to use Lumbar support in cars when driving

## 2018-03-13 NOTE — Therapy (Signed)
[place], [place], [address] Phone: [phone]   Fax:  [phone]  Physical Therapy Treatment  Patient Details  Name: Joshua [name] MRN: [id number] Date of Birth: [birth year] Referring Provider: Dr. [name]    Encounter Date: 03/13/2018  PT End of Session - 03/13/18 1402    Visit Number  2    Number of Visits  12    Date for PT Re-Evaluation  04/20/18    PT Start Time  1402    PT Stop Time  1500    PT Time Calculation (min)  58 min    Activity Tolerance  Patient tolerated treatment well    Behavior During Therapy  [facility] for tasks assessed/performed       Past Medical History:  Diagnosis Date  . Hyperlipidemia   . Kidney stone     Past Surgical History:  Procedure Laterality Date  . HEMORRHOID SURGERY      There were no vitals filed for this visit.  Subjective Assessment - 03/13/18 1405    Subjective  Pt reports he is feeling much better than last visit. He has performing HEP 2x/day and has noticed a difference.   He does chin tucks throughout day. His pain is less often and not as intense. He has been more aware of posture with driving; he is trying not to recline seat in car anymore.     Patient Stated Goals  get rid of pain     Currently in Pain?  No/denies    Pain Score  0-No pain    Pain Orientation  Right         [facility] PT Assessment - 03/13/18 0001      Assessment   Medical Diagnosis  Rt Rotator cuff tendonitis     Referring Provider  Dr. [name]     Onset Date/Surgical Date  03/01/18    Hand Dominance  Right    Next MD Visit  04/09/18        [facility] Adult PT Treatment/Exercise - 03/13/18 0001      Self-Care   Self-Care  Other Self-Care Comments    Other Self-Care Comments   pt educated on safety, application and parameters of TENS.  Pt verbalized understanding.       Shoulder Exercises: Supine   Other Supine Exercises  prolonged snow angel and BUE dynamic snow  angel x 8 reps; RUE single snow angel with arm off edge of table x 3 reps    Other Supine Exercises  Lower trunk rotation with arms in T, to tolerance, x 5 reps each direction      Shoulder Exercises: Seated   Other Seated Exercises  thoracic ext over back of chair, with hands supporting head, x 5 sec x 3 reps      Shoulder Exercises: Standing   Other Standing Exercises  axial extension 10 sec x 5; scap squeeze 10 sec x 5; L's and W's with 3 sec hold x 10       Shoulder Exercises: Stretch   Other Shoulder Stretches  3 way doorway stretch 30 sec x 2 each position - trial of unilateral high position  minor cues for form    Other Shoulder Stretches  biceps stretch at door 30 sec x each arm; shoulder ext with hands laced x 5 sec x 3 reps      Moist Heat Therapy   Number Minutes Moist Heat  15 Minutes    Moist  Heat Location  Shoulder Rt      Electrical Stimulation   Electrical Stimulation Location  Rt shoulder girdle to deltoid    Electrical Stimulation Action  TENS    Electrical Stimulation Parameters  to tolerance     Electrical Stimulation Goals  Tone;Pain      Pt educated on supine to/from sit via log roll. Pt verbalized understanding and returned demo.        PT Education - 03/13/18 1453    Education provided  Yes    Education Details  HEP    Person(s) Educated  Patient    Methods  Explanation;Handout    Comprehension  Verbalized understanding          PT Long Term Goals - 03/13/18 1426      PT LONG TERM GOAL #1   Title  Improve posture and alignment with patient to demonstrate good upright posture with posterior shoulder girdle engaged. 04/20/18    Time  6    Period  Weeks    Status  On-going      PT LONG TERM GOAL #2   Title  Full pain free ROM Rt shoulder to enable patient to return to normal functional activities 04/20/18      PT LONG TERM GOAL #3   Title  Decrease frequency, intensity and duration of Rt shoudler pain by 50-75% allowing patient to return to  normal functional activities 04/20/18    Time  6    Period  Weeks    Status  On-going      PT LONG TERM GOAL #4   Title  Independent in HEP 04/20/18    Time  6    Period  Weeks    Status  On-going      PT LONG TERM GOAL #5   Title  Improve FOTO to </= 30% limitation 04/20/18    Time  6    Period  Weeks    Status  On-going            Plan - 03/13/18 1431    Clinical Impression Statement  Pt has had positive response to HEP and postural correction.  Pt reporting 0/10 pain today.  He tolerated all exercises well without production of pain; required minor cues for form.  Progressing well towards goals.     Rehab Potential  Good    PT Frequency  2x / week    PT Duration  6 weeks    PT Treatment/Interventions  Patient/family education;ADLs/Self Care Home Management;Cryotherapy;Electrical Stimulation;Iontophoresis 4mg /ml Dexamethasone;Moist Heat;Ultrasound;Dry needling;Manual techniques;Neuromuscular re-education;Therapeutic activities;Therapeutic exercise    PT Next Visit Plan  continue posture strengthening, back care, possible introduction to scapular strengthening with bands.     Consulted and Agree with Plan of Care  Patient       Patient will benefit from skilled therapeutic intervention in order to improve the following deficits and impairments:  Postural dysfunction, Improper body mechanics, Pain, Increased fascial restricitons, Increased muscle spasms, Decreased strength, Decreased range of motion, Decreased activity tolerance  Visit Diagnosis: Acute pain of right shoulder  Other symptoms and signs involving the musculoskeletal system  Abnormal posture     Problem List Patient Active Problem List   Diagnosis Date Noted  . History of adenomatous polyp of colon 03/03/2018  . RBBB 06/09/2012  . Nephrolithiasis 05/22/2011  . Other and unspecified hyperlipidemia 11/20/2009  . DDD (degenerative disc disease), cervical 05/13/2008  . Impaired fasting glucose 10/27/2007    Kerin Perna, PTA 03/13/18 3:03 PM  [place], [place], [address] Phone: [phone]   Fax:  [phone]  Name: Josephmichael Lisenbee MRN: [id number] Date of Birth: 10-30-1946

## 2018-03-16 ENCOUNTER — Encounter: Payer: Self-pay | Admitting: Physical Therapy

## 2018-03-16 ENCOUNTER — Ambulatory Visit (INDEPENDENT_AMBULATORY_CARE_PROVIDER_SITE_OTHER): Payer: Medicare HMO | Admitting: Physical Therapy

## 2018-03-16 DIAGNOSIS — M25511 Pain in right shoulder: Secondary | ICD-10-CM

## 2018-03-16 DIAGNOSIS — R29898 Other symptoms and signs involving the musculoskeletal system: Secondary | ICD-10-CM

## 2018-03-16 DIAGNOSIS — R293 Abnormal posture: Secondary | ICD-10-CM | POA: Diagnosis not present

## 2018-03-16 NOTE — Therapy (Signed)
[place], [place], [address] Phone: [phone]   Fax:  [phone]  Physical Therapy Treatment  Patient Details  Name: Joshua [name] MRN: [id number] Date of Birth: 15-Mar-1946 Referring Provider: Dr. [name]   Encounter Date: 03/16/2018  PT End of Session - 03/16/18 1023    Visit Number  3    Number of Visits  12    Date for PT Re-Evaluation  04/20/18    PT Start Time  1018    PT Stop Time  1108    PT Time Calculation (min)  50 min    Activity Tolerance  Patient limited by pain    Behavior During Therapy  [facility] for tasks assessed/performed       Past Medical History:  Diagnosis Date  . Hyperlipidemia   . Kidney stone     Past Surgical History:  Procedure Laterality Date  . HEMORRHOID SURGERY      There were no vitals filed for this visit.  Subjective Assessment - 03/16/18 1023    Subjective  Pt reports he has been using lumbar support in car when driving.  He has had some on-off catching in his Rt shoulder over last couple days.  Overall, things are improving.      Patient Stated Goals  get rid of pain     Currently in Pain?  Yes    Pain Score  5     Pain Location  Shoulder    Pain Orientation  Right;Lateral    Pain Descriptors / Indicators  Sharp    Aggravating Factors   reaching    Pain Relieving Factors  TENS         [facility] PT Assessment - 03/16/18 0001      Assessment   Medical Diagnosis  Rt Rotator cuff tendonitis     Referring Provider  Dr. [name]    Onset Date/Surgical Date  03/01/18    Hand Dominance  Right    Next MD Visit  04/09/18       [facility] Adult PT Treatment/Exercise - 03/16/18 0001      Shoulder Exercises: Supine   Flexion  Right;AROM;5 reps;Limitations    Flexion Limitations  painful arc/ stopped    Other Supine Exercises  prolonged RUE snow angel dynamic snow angel with RUE with forearm off edge of table x 10 reps;  scap retraction x 5 sec x 10 reps;     D2 flexion AROM with RUE x 5 reps, some pain at end ranges, stopped     Other Supine Exercises  Lower trunk rotation with arms in T, to tolerance, x 5 reps each direction      Shoulder Exercises: Sidelying   Other Sidelying Exercises  RUE: empty can, small range, no wt x 8 reps, repeated with 1#;  full can, small range, 1# x 10 reps reported clicking in Rt shoulder with full can      Shoulder Exercises: ROM/Strengthening   UBE (Upper Arm Bike)  L1: 45 sec each direction, standing      Moist Heat Therapy   Number Minutes Moist Heat  15 Minutes    Moist Heat Location  Shoulder Rt      Electrical Stimulation   Electrical Stimulation Location  Rt shoulder girdle to deltoid    Electrical Stimulation Action  TENS    Electrical Stimulation Parameters  to tolerance    Electrical Stimulation Goals  Tone;Pain      Manual Therapy  Manual Therapy  Myofascial release;Soft tissue mobilization    Soft tissue mobilization  STM to Rt pec maj and minor; along with periscapular muscles.  TPR to Rt subscap, teres major.     Myofascial Release  Rt pec release             PT Education - 03/16/18 1320    Education provided  Yes    Education Details  Change of bent elbows during doorway stretch to straight elbows (trial)     Person(s) Educated  Patient    Methods  Explanation    Comprehension  Verbalized understanding          PT Long Term Goals - 03/13/18 1426      PT LONG TERM GOAL #1   Title  Improve posture and alignment with patient to demonstrate good upright posture with posterior shoulder girdle engaged. 04/20/18    Time  6    Period  Weeks    Status  On-going      PT LONG TERM GOAL #2   Title  Full pain free ROM Rt shoulder to enable patient to return to normal functional activities 04/20/18      PT LONG TERM GOAL #3   Title  Decrease frequency, intensity and duration of Rt shoudler pain by 50-75% allowing patient to return to normal functional activities 04/20/18    Time  6     Period  Weeks    Status  On-going      PT LONG TERM GOAL #4   Title  Independent in HEP 04/20/18    Time  6    Period  Weeks    Status  On-going      PT LONG TERM GOAL #5   Title  Improve FOTO to </= 30% limitation 04/20/18    Time  6    Period  Weeks    Status  On-going            Plan - 03/16/18 1320    Clinical Impression Statement  Pt demonstrated painful arc with RUE; had difficulty tolerating doorway stretch due to increased "irritation" in Rt shoulder.  Pt noted decreased shoulder pain with flexion/scaption in supine with scap retraction.  Pt reported reduced pain at end of session with use of MHP and estim.      Rehab Potential  Good    PT Frequency  2x / week    PT Duration  6 weeks    PT Treatment/Interventions  Patient/family education;ADLs/Self Care Home Management;Cryotherapy;Electrical Stimulation;Iontophoresis 4mg /ml Dexamethasone;Moist Heat;Ultrasound;Dry needling;Manual techniques;Neuromuscular re-education;Therapeutic activities;Therapeutic exercise    PT Next Visit Plan  continue posture strengthening, back care, possible introduction to scapular strengthening with bands.     Consulted and Agree with Plan of Care  Patient       Patient will benefit from skilled therapeutic intervention in order to improve the following deficits and impairments:  Postural dysfunction, Improper body mechanics, Pain, Increased fascial restricitons, Increased muscle spasms, Decreased strength, Decreased range of motion, Decreased activity tolerance  Visit Diagnosis: Acute pain of right shoulder  Other symptoms and signs involving the musculoskeletal system  Abnormal posture     Problem List Patient Active Problem List   Diagnosis Date Noted  . History of adenomatous polyp of colon 03/03/2018  . RBBB 06/09/2012  . Nephrolithiasis 05/22/2011  . Other and unspecified hyperlipidemia 11/20/2009  . DDD (degenerative disc disease), cervical 05/13/2008  . Impaired fasting  glucose 10/27/2007   Kerin Perna, PTA 03/16/18 1:28 PM  [place], [place], [address] Phone: [phone]   Fax:  [phone]  Name: Trygg Mantz MRN: [id number] Date of Birth: 1945-12-12

## 2018-03-20 ENCOUNTER — Ambulatory Visit (INDEPENDENT_AMBULATORY_CARE_PROVIDER_SITE_OTHER): Payer: Medicare HMO | Admitting: Physical Therapy

## 2018-03-20 DIAGNOSIS — R293 Abnormal posture: Secondary | ICD-10-CM | POA: Diagnosis not present

## 2018-03-20 DIAGNOSIS — M25511 Pain in right shoulder: Secondary | ICD-10-CM | POA: Diagnosis not present

## 2018-03-20 DIAGNOSIS — R29898 Other symptoms and signs involving the musculoskeletal system: Secondary | ICD-10-CM | POA: Diagnosis not present

## 2018-03-20 NOTE — Patient Instructions (Signed)
Resisted External Rotation: in Neutral - Bilateral  PALMS UP!!!  - L's with a band.  Sit or stand, tubing in both hands, elbows at sides, bent to 90, forearms forward. Pinch shoulder blades together and rotate forearms out. Keep elbows at sides. Repeat __10__ times per set. Do __2-3__ sets per session. Do __3-4__ sessions per week.  Resistive Band Rowing   With resistive band anchored in door, grasp both ends. Keeping elbows bent, pull back, squeezing shoulder blades together. Hold _3-5___ seconds. Repeat _10___ times. Do __3 sets,  3 x./week_    Strengthening: Resisted Extension   Hold tubing with both hands, arms forward. Pull arms back, elbow straight. Repeat _10___ times per set. Do _3___ sets per session. Do 3 sessions per week.    [facility] [place], [place] [address]  [phone] (office) [phone] (fax)

## 2018-03-20 NOTE — Therapy (Signed)
[place], [place], [address] Phone: [phone]   Fax:  [phone]  Physical Therapy Treatment  Patient Details  Name: Joshua [name] MRN: [id number] Date of Birth: 01-Sep-1946 Referring Provider: Dr. [name]   Encounter Date: 03/20/2018  PT End of Session - 03/20/18 1029    Visit Number  4    Number of Visits  12    Date for PT Re-Evaluation  04/20/18    PT Start Time  1017    PT Stop Time  1112    PT Time Calculation (min)  55 min    Activity Tolerance  Patient tolerated treatment well;No increased pain    Behavior During Therapy  [facility] for tasks assessed/performed       Past Medical History:  Diagnosis Date  . Hyperlipidemia   . Kidney stone     Past Surgical History:  Procedure Laterality Date  . HEMORRHOID SURGERY      There were no vitals filed for this visit.  Subjective Assessment - 03/20/18 1024    Subjective  Pt reports he received his TENS unit in mail; he would like to know where to place the electrodes.  He weeding yesterday without difficulty.  He is pleased with progress of shoulder so far.     Patient Stated Goals  get rid of pain     Currently in Pain?  No/denies    Pain Score  0-No pain up to 2/10, occasionally.    Pain Location  Shoulder    Pain Orientation  Right;Lateral    Pain Descriptors / Indicators  Sharp sporadic        [facility] Adult PT Treatment/Exercise - 03/20/18 0001      Shoulder Exercises: Standing   External Rotation  Strengthening;Both;10 reps;Theraband 2 sets    Theraband Level (Shoulder External Rotation)  Level 1 (Yellow)    Extension  Right;Left;10 reps;Theraband    Theraband Level (Shoulder Extension)  Level 2 (Red)    Row  Strengthening;Both;10 reps;Theraband 3 sec pause in retraction, 2 sets    Theraband Level (Shoulder Row)  Level 2 (Red);Level 3 (Green)      Shoulder Exercises: ROM/Strengthening   UBE (Upper Arm Bike)  L2: 45 sec each  direction, standing      Shoulder Exercises: Stretch   Other Shoulder Stretches  low doorway position x 30 sec x 2; RUE mid and high level doorway stretch x 30 sec x 2 each (some pain upon lowering arm to neutral)    Other Shoulder Stretches  biceps stretch at door 30 sec x 2      Moist Heat Therapy   Number Minutes Moist Heat  15 Minutes    Moist Heat Location  Shoulder Rt      Electrical Stimulation   Electrical Stimulation Location  Rt shoulder girdle to deltoid    Electrical Stimulation Action  IFC    Electrical Stimulation Parameters  to tolerance    Electrical Stimulation Goals  Pain      Manual Therapy   Soft tissue mobilization  STM to Rt pec maj and minor; along with periscapular muscles.  TPR to Rt subscap, teres major.     Myofascial Release  Rt pec release             PT Education - 03/20/18 1103    Education provided  Yes    Education Details  HEP, issued red and yellow bands    Person(s) Educated  Patient  Methods  Explanation;Handout;Demonstration;Tactile cues;Verbal cues    Comprehension  Verbalized understanding;Returned demonstration          PT Long Term Goals - 03/13/18 1426      PT LONG TERM GOAL #1   Title  Improve posture and alignment with patient to demonstrate good upright posture with posterior shoulder girdle engaged. 04/20/18    Time  6    Period  Weeks    Status  On-going      PT LONG TERM GOAL #2   Title  Full pain free ROM Rt shoulder to enable patient to return to normal functional activities 04/20/18      PT LONG TERM GOAL #3   Title  Decrease frequency, intensity and duration of Rt shoudler pain by 50-75% allowing patient to return to normal functional activities 04/20/18    Time  6    Period  Weeks    Status  On-going      PT LONG TERM GOAL #4   Title  Independent in HEP 04/20/18    Time  6    Period  Weeks    Status  On-going      PT LONG TERM GOAL #5   Title  Improve FOTO to </= 30% limitation 04/20/18    Time  6     Period  Weeks    Status  On-going            Plan - 03/20/18 1326    Clinical Impression Statement  Pt reporting less pain in Rt shoulder today; less episodes of pain, and lower pain rating during day.  He had a couple episodes of painful arc in Rt arm when lowering arm from doorway stretch.  continued education regarding posture and it's role in shoulder dysfunction.  Pt tolerated new exercises well, without increase in pain.  Progressing well towards goals.     Rehab Potential  Good    PT Frequency  2x / week    PT Duration  6 weeks    PT Treatment/Interventions  Patient/family education;ADLs/Self Care Home Management;Cryotherapy;Electrical Stimulation;Iontophoresis 4mg /ml Dexamethasone;Moist Heat;Ultrasound;Dry needling;Manual techniques;Neuromuscular re-education;Therapeutic activities;Therapeutic exercise    PT Next Visit Plan  continue posture strengthening, assess response to new HEP exercises.     Consulted and Agree with Plan of Care  Patient       Patient will benefit from skilled therapeutic intervention in order to improve the following deficits and impairments:  Postural dysfunction, Improper body mechanics, Pain, Increased fascial restricitons, Increased muscle spasms, Decreased strength, Decreased range of motion, Decreased activity tolerance  Visit Diagnosis: Acute pain of right shoulder  Other symptoms and signs involving the musculoskeletal system  Abnormal posture     Problem List Patient Active Problem List   Diagnosis Date Noted  . History of adenomatous polyp of colon 03/03/2018  . RBBB 06/09/2012  . Nephrolithiasis 05/22/2011  . Other and unspecified hyperlipidemia 11/20/2009  . DDD (degenerative disc disease), cervical 05/13/2008  . Impaired fasting glucose 10/27/2007   Kerin Perna, PTA 03/20/18 1:29 PM  [place], [place], [address] Phone: [phone]    Fax:  [phone]  Name: Joshua [name] MRN: [id number] Date of Birth: April 20, 1946

## 2018-03-23 ENCOUNTER — Ambulatory Visit: Payer: Medicare HMO | Admitting: Physical Therapy

## 2018-03-23 DIAGNOSIS — M25511 Pain in right shoulder: Secondary | ICD-10-CM

## 2018-03-23 DIAGNOSIS — R293 Abnormal posture: Secondary | ICD-10-CM | POA: Diagnosis not present

## 2018-03-23 DIAGNOSIS — R29898 Other symptoms and signs involving the musculoskeletal system: Secondary | ICD-10-CM | POA: Diagnosis not present

## 2018-03-23 NOTE — Therapy (Signed)
[place], [place], [address] Phone: [phone]   Fax:  [phone]  Physical Therapy Treatment  Patient Details  Name: Joshua [name] MRN: [id number] Date of Birth: 04/27/1946 Referring Provider: Dr. [name]   Encounter Date: 03/23/2018  PT End of Session - 03/23/18 0807    Visit Number  5    Number of Visits  12    Date for PT Re-Evaluation  04/20/18    PT Start Time  0802    PT Stop Time  0856    PT Time Calculation (min)  54 min    Activity Tolerance  Patient tolerated treatment well;No increased pain    Behavior During Therapy  WFL for tasks assessed/performed       Past Medical History:  Diagnosis Date  . Hyperlipidemia   . Kidney stone     Past Surgical History:  Procedure Laterality Date  . HEMORRHOID SURGERY      There were no vitals filed for this visit.  Subjective Assessment - 03/23/18 0807    Subjective  Pt reports he has not had any pain in shoulder since last week.  He was able to cut grass, and move some plants.  He plans to pressure wash his deck this afternoon.      Patient Stated Goals  get rid of pain     Currently in Pain?  No/denies    Pain Score  0-No pain         [facility] PT Assessment - 03/23/18 0001      Assessment   Medical Diagnosis  Rt Rotator cuff tendonitis     Referring Provider  Dr. [name]    Onset Date/Surgical Date  03/01/18    Hand Dominance  Right    Next MD Visit  04/09/18      AROM   Right Shoulder Extension  58 Degrees    Right Shoulder Flexion  152 Degrees    Right Shoulder ABduction  157 Degrees    Right Shoulder Internal Rotation  -- thumb to T10    Right Shoulder External Rotation  87 Degrees supine, shoulder Abdct 90 deg    Left Shoulder Extension  55 Degrees    Left Shoulder Flexion  145 Degrees    Left Shoulder ABduction  150 Degrees    Left Shoulder Internal Rotation  -- thumb to T8    Left Shoulder External Rotation  75  Degrees supine, shoulder Abdct 90 deg       [facility] Adult PT Treatment/Exercise - 03/23/18 0001      Shoulder Exercises: Supine   Horizontal ABduction  Strengthening;10 reps;Theraband    Theraband Level (Shoulder Horizontal ABduction)  Level 2 (Red)    External Rotation  Strengthening;Both;10 reps;Theraband    Theraband Level (Shoulder External Rotation)  Level 2 (Red)    Flexion  Strengthening;Both;10 reps;Theraband    Theraband Level (Shoulder Flexion)  Level 2 (Red)    Other Supine Exercises  sash RUE x 15 with red band    Other Supine Exercises  lower trunk rotation with arms in T x 10 sec x 2 reps;  Lt/Rt fig 4 stretch x 15 sec each leg.  elbow press x 10 sec x 5 reps      Shoulder Exercises: Standing   Other Standing Exercises  upright row (standing on band, to simulate starting power washer) red band x 10 Lt/ Rt, green band x5 Lt/Rt  - VC and demo for improved  form.       Shoulder Exercises: ROM/Strengthening   UBE (Upper Arm Bike)  L2: 2 min forward, 1 min backward.  standing      Shoulder Exercises: Stretch   Other Shoulder Stretches  3 position doorway stretch x 30 sec bilat,  overhead stretch (full flexion bilat) x 15 sec x 3 reps     Other Shoulder Stretches  biceps stretch at door 30 sec x 2      Moist Heat Therapy   Number Minutes Moist Heat  15 Minutes    Moist Heat Location  Shoulder Rt      Electrical Stimulation   Electrical Stimulation Location  Rt shoulder girdle to deltoid    Electrical Stimulation Action  IFC    Electrical Stimulation Parameters   to tolerance    Electrical Stimulation Goals  Pain                  PT Long Term Goals - 03/23/18 0810      PT LONG TERM GOAL #1   Title  Improve posture and alignment with patient to demonstrate good upright posture with posterior shoulder girdle engaged. 04/20/18    Time  6    Period  Weeks    Status  On-going      PT LONG TERM GOAL #2   Title  Full pain free ROM Rt shoulder to enable patient to  return to normal functional activities 04/20/18    Time  6    Period  Weeks    Status  On-going      PT LONG TERM GOAL #3   Title  Decrease frequency, intensity and duration of Rt shoudler pain by 50-75% allowing patient to return to normal functional activities 04/20/18    Time  6    Period  Weeks    Status  On-going      PT LONG TERM GOAL #4   Title  Independent in HEP 04/20/18    Time  6    Period  Weeks    Status  On-going      PT LONG TERM GOAL #5   Title  Improve FOTO to </= 30% limitation 04/20/18    Time  6    Period  Weeks    Status  On-going            Plan - 03/23/18 8677    Clinical Impression Statement  Pt demonstrated improved Rt shoulder ROM; has met LTG #2.  He was able to tolerate exercises well with minimal increase in pain/discomfort.      Rehab Potential  Good    PT Frequency  2x / week    PT Duration  6 weeks    PT Treatment/Interventions  Patient/family education;ADLs/Self Care Home Management;Cryotherapy;Electrical Stimulation;Iontophoresis 55m/ml Dexamethasone;Moist Heat;Ultrasound;Dry needling;Manual techniques;Neuromuscular re-education;Therapeutic activities;Therapeutic exercise    PT Next Visit Plan  continue posture strengthening, Rt shoulder strengthening.      Consulted and Agree with Plan of Care  Patient       Patient will benefit from skilled therapeutic intervention in order to improve the following deficits and impairments:  Postural dysfunction, Improper body mechanics, Pain, Increased fascial restricitons, Increased muscle spasms, Decreased strength, Decreased range of motion, Decreased activity tolerance  Visit Diagnosis: Acute pain of right shoulder  Other symptoms and signs involving the musculoskeletal system  Abnormal posture     Problem List Patient Active Problem List   Diagnosis Date Noted  . History of adenomatous polyp of  colon 03/03/2018  . RBBB 06/09/2012  . Nephrolithiasis 05/22/2011  . Other and unspecified  hyperlipidemia 11/20/2009  . DDD (degenerative disc disease), cervical 05/13/2008  . Impaired fasting glucose 10/27/2007   Kerin Perna, PTA 03/23/18 2:16 PM  [place] [employer], [place], [address] Phone: [phone]   Fax:  [phone]  Name: Joshua [name] MRN: [id number] Date of Birth: November 24, 1945

## 2018-03-27 ENCOUNTER — Encounter: Payer: Self-pay | Admitting: Physical Therapy

## 2018-03-27 ENCOUNTER — Ambulatory Visit: Payer: Medicare HMO | Admitting: Physical Therapy

## 2018-03-27 DIAGNOSIS — R293 Abnormal posture: Secondary | ICD-10-CM

## 2018-03-27 DIAGNOSIS — R29898 Other symptoms and signs involving the musculoskeletal system: Secondary | ICD-10-CM

## 2018-03-27 DIAGNOSIS — M25511 Pain in right shoulder: Secondary | ICD-10-CM | POA: Diagnosis not present

## 2018-03-27 NOTE — Patient Instructions (Addendum)
When you return to the band exercise place a towel or noodle under elbow to turn deltoid off with arm rotation exercise.    Strengthening: Isometric Internal Rotation    Keeping right elbow pressed in at side, use other hand at wrist to apply light resistance to inward motion. Hold __5__ seconds. Repeat __10__ times per set. Do _1___ sets per session. Do _1___ sessions per day.   Strengthening: Isometric External Rotation    Keeping right elbow pressed in at side, use other hand at wrist to apply light resistance to outward motion. Hold __5__ seconds. Repeat __10__ times per set. Do __1__ sets per session. Do __1__ sessions per day. No pain,    Trigger Point Dry Needling  . What is Trigger Point Dry Needling (DN)? o DN is a physical therapy technique used to treat muscle pain and dysfunction. Specifically, DN helps deactivate muscle trigger points (muscle knots).  o A thin filiform needle is used to penetrate the skin and stimulate the underlying trigger point. The goal is for a local twitch response (LTR) to occur and for the trigger point to relax. No medication of any kind is injected during the procedure.   . What Does Trigger Point Dry Needling Feel Like?  o The procedure feels different for each individual patient. Some patients report that they do not actually feel the needle enter the skin and overall the process is not painful. Very mild bleeding may occur. However, many patients feel a deep cramping in the muscle in which the needle was inserted. This is the local twitch response.   Marland Kitchen How Will I feel after the treatment? o Soreness is normal, and the onset of soreness may not occur for a few hours. Typically this soreness does not last longer than two days.  o Bruising is uncommon, however; ice can be used to decrease any possible bruising.  o In rare cases feeling tired or nauseous after the treatment is normal. In addition, your symptoms may get worse before they get better,  this period will typically not last longer than 24 hours.   . What Can I do After My Treatment? o Increase your hydration by drinking more water for the next 24 hours. o You may place ice or heat on the areas treated that have become sore, however, do not use heat on inflamed or bruised areas. Heat often brings more relief post needling. o You can continue your regular activities, but vigorous activity is not recommended initially after the treatment for 24 hours. o DN is best combined with other physical therapy such as strengthening, stretching, and other therapies.

## 2018-03-27 NOTE — Therapy (Signed)
[place], [place], [address] Phone: [phone]   Fax:  [phone]  Physical Therapy Treatment  Patient Details  Name: Joshua [name] MRN: [id number] Date of Birth: 06-13-1946 Referring Provider: Dr. [name]   Encounter Date: 03/27/2018  PT End of Session - 03/27/18 0845    Visit Number  6    Number of Visits  12    Date for PT Re-Evaluation  04/20/18    PT Start Time  0845    PT Stop Time  0939    PT Time Calculation (min)  54 min    Activity Tolerance  Patient tolerated treatment well       Past Medical History:  Diagnosis Date  . Hyperlipidemia   . Kidney stone     Past Surgical History:  Procedure Laterality Date  . HEMORRHOID SURGERY      There were no vitals filed for this visit.  Subjective Assessment - 03/27/18 0846    Subjective  Pt reports he has had a set back this week.  He pulled to start a motor and was having trouble and felt a pop and the pain has returned in the Rt deltoid    Patient Stated Goals  get rid of pain     Currently in Pain?  Yes    Pain Score  5     Pain Location  Shoulder    Pain Orientation  Right    Pain Descriptors / Indicators  Sharp;Aching    Pain Type  Acute pain    Pain Onset  In the past 7 days    Pain Frequency  Constant    Aggravating Factors   has been more constant since the pop this past week, picking up items    Pain Relieving Factors  some relief with OTC meds.                        [facility] Adult PT Treatment/Exercise - 03/27/18 0001      Self-Care   Other Self-Care Comments   modiifed HEP since he has had flare up of pain, stopped all band ex for now, return to initial stetching and isometrics.       Shoulder Exercises: Pulleys   Flexion  2 minutes    Scaption  2 minutes      Shoulder Exercises: ROM/Strengthening   UBE (Upper Arm Bike)  -- held today  Performed 10x5sec isometrics Rt shoulder ER /IR with elbow  braced to side to shut off deltoid     Modalities   Modalities  Electrical Stimulation;Moist Heat;Cryotherapy      Moist Heat Therapy   Number Minutes Moist Heat  15 Minutes    Moist Heat Location  -- Rt deltoid      Cryotherapy   Number Minutes Cryotherapy  15 Minutes    Cryotherapy Location  Shoulder Rt top    Type of Cryotherapy  Ice pack      Electrical Stimulation   Electrical Stimulation Location  Rt shoulder and deltoid    Electrical Stimulation Action  modulation - TENs    Electrical Stimulation Parameters  to tolerance    Electrical Stimulation Goals  Pain;Tone      Manual Therapy   Manual Therapy  Soft tissue mobilization    Soft tissue mobilization  STM to Rt deltoid to release tightness       Trigger Point Dry Needling - 03/27/18 0920  Consent Given?  Yes    Education Handout Provided  Yes    Muscles Treated Upper Body  -- deltoid Rt good twitch respone           PT Education - 03/27/18 0903    Education provided  Yes    Education Details  HEP and DN and shoulder mechanics    Person(s) Educated  Patient    Methods  Explanation;Handout    Comprehension  Verbalized understanding          PT Long Term Goals - 03/23/18 0810      PT LONG TERM GOAL #1   Title  Improve posture and alignment with patient to demonstrate good upright posture with posterior shoulder girdle engaged. 04/20/18    Time  6    Period  Weeks    Status  On-going      PT LONG TERM GOAL #2   Title  Full pain free ROM Rt shoulder to enable patient to return to normal functional activities 04/20/18    Time  6    Period  Weeks    Status  On-going      PT LONG TERM GOAL #3   Title  Decrease frequency, intensity and duration of Rt shoudler pain by 50-75% allowing patient to return to normal functional activities 04/20/18    Time  6    Period  Weeks    Status  On-going      PT LONG TERM GOAL #4   Title  Independent in HEP 04/20/18    Time  6    Period  Weeks    Status  On-going       PT LONG TERM GOAL #5   Title  Improve FOTO to </= 30% limitation 04/20/18    Time  6    Period  Weeks    Status  On-going            Plan - 03/27/18 0926    Clinical Impression Statement  Pt came in today with flare up of the Rt shoulder.  His deltoid was very tight and pulling the humeral head up into the joint compressing tissues.  He had good releases with DN and manual work.  Recommend he hold back on some of his exercise and limit overhead motion for the weekend to allow his shoulder to settle back down.  If doing well he will add bands back in next week.     Rehab Potential  Good    PT Frequency  2x / week    PT Duration  6 weeks    PT Treatment/Interventions  Patient/family education;ADLs/Self Care Home Management;Cryotherapy;Electrical Stimulation;Iontophoresis 4mg /ml Dexamethasone;Moist Heat;Ultrasound;Dry needling;Manual techniques;Neuromuscular re-education;Therapeutic activities;Therapeutic exercise    PT Next Visit Plan  assess deltoid tightness, upper back and RTC strengthening    Consulted and Agree with Plan of Care  Patient       Patient will benefit from skilled therapeutic intervention in order to improve the following deficits and impairments:  Postural dysfunction, Improper body mechanics, Pain, Increased fascial restricitons, Increased muscle spasms, Decreased strength, Decreased range of motion, Decreased activity tolerance  Visit Diagnosis: Acute pain of right shoulder  Other symptoms and signs involving the musculoskeletal system  Abnormal posture     Problem List Patient Active Problem List   Diagnosis Date Noted  . History of adenomatous polyp of colon 03/03/2018  . RBBB 06/09/2012  . Nephrolithiasis 05/22/2011  . Other and unspecified hyperlipidemia 11/20/2009  . DDD (degenerative disc disease),  cervical 05/13/2008  . Impaired fasting glucose 10/27/2007    Boneta Lucks rPT  03/27/2018, 9:28 AM  [facility] [place], [place], [address] Phone: [phone]   Fax:  [phone]  Name: Joshua [name] MRN: [id number] Date of Birth: 1946/10/08

## 2018-03-31 ENCOUNTER — Ambulatory Visit: Payer: Medicare HMO | Admitting: Physical Therapy

## 2018-03-31 DIAGNOSIS — R293 Abnormal posture: Secondary | ICD-10-CM

## 2018-03-31 DIAGNOSIS — R29898 Other symptoms and signs involving the musculoskeletal system: Secondary | ICD-10-CM | POA: Diagnosis not present

## 2018-03-31 DIAGNOSIS — M25511 Pain in right shoulder: Secondary | ICD-10-CM

## 2018-03-31 NOTE — Therapy (Signed)
[place], [place], [address] Phone: [phone]   Fax:  [phone]  Physical Therapy Treatment  Patient Details  Name: Joshua [name] MRN: [id number] Date of Birth: Aug 25, 1946 Referring Provider: Dr [name]   Encounter Date: 03/31/2018  PT End of Session - 03/31/18 0845    Visit Number  7    Number of Visits  12    Date for PT Re-Evaluation  04/20/18    PT Start Time  0845    PT Stop Time  0940    PT Time Calculation (min)  55 min    Activity Tolerance  Patient tolerated treatment well       Past Medical History:  Diagnosis Date  . Hyperlipidemia   . Kidney stone     Past Surgical History:  Procedure Laterality Date  . HEMORRHOID SURGERY      There were no vitals filed for this visit.  Subjective Assessment - 03/31/18 0845    Subjective  Joshua [name] reports that the DN helped a lot, still feels like he has some tightness,  Had trouble cutting a water melon this weekend had to use the tens after.     Patient Stated Goals  get rid of pain     Currently in Pain?  Yes    Pain Score  2     Pain Location  Shoulder    Pain Orientation  Right deltoid.    Pain Descriptors / Indicators  Tightness    Pain Type  Acute pain    Pain Onset  1 to 4 weeks ago    Pain Frequency  Intermittent    Aggravating Factors   lifting and using the arm.     Pain Relieving Factors  OTC meds         [facility] PT Assessment - 03/31/18 0001      Assessment   Medical Diagnosis  Rt Rotator cuff tendonitis     Referring Provider  Dr [name]      AROM   Right/Left Shoulder  Right [facility] pain      Palpation   Palpation comment  trigger points in Rt deltoid - more proximal compared to last week.                    [facility] Adult PT Treatment/Exercise - 03/31/18 0001      Shoulder Exercises: Sidelying   External Rotation  Strengthening;Right;20 reps;Weights    External Rotation Weight (lbs)  3#    Other  Sidelying Exercises  2x10 empty can in small ROM 2#.       Shoulder Exercises: Pulleys   Flexion  2 minutes      Shoulder Exercises: Stretch   Other Shoulder Stretches  doorway stretches with strap to open chest and shoulders.     Other Shoulder Stretches  supine stretches over black bolster arms overhead and in T positions      Modalities   Modalities  Electrical Stimulation;Moist Heat;Cryotherapy      Moist Heat Therapy   Number Minutes Moist Heat  15 Minutes    Moist Heat Location  Shoulder      Electrical Stimulation   Electrical Stimulation Location  Rt shoulder and deltoid    Electrical Stimulation Action  TENs modulation     Electrical Stimulation Parameters  to tolerance    Electrical Stimulation Goals  Pain;Tone      Manual Therapy   Manual Therapy  Soft tissue mobilization  Soft tissue mobilization  STM to deltoid       Trigger Point Dry Needling - 03/31/18 0900    Consent Given?  Yes    Education Handout Provided  No    Muscles Treated Upper Body  -- deltoid Rt with stim - strong contraction                PT Long Term Goals - 03/31/18 0847      PT LONG TERM GOAL #1   Title  Improve posture and alignment with patient to demonstrate good upright posture with posterior shoulder girdle engaged. 04/20/18    Status  On-going      PT LONG TERM GOAL #2   Title  Full pain free ROM Rt shoulder to enable patient to return to normal functional activities 04/20/18    Status  Partially Met painfree ROM however not able to perforn functional activities      PT LONG TERM GOAL #3   Title  Decrease frequency, intensity and duration of Rt shoudler pain by 50-75% allowing patient to return to normal functional activities 04/20/18    Status  On-going      PT LONG TERM GOAL #4   Title  Independent in HEP 04/20/18    Status  On-going      PT LONG TERM GOAL #5   Title  Improve FOTO to </= 30% limitation 04/20/18    Status  On-going            Plan - 03/31/18  0913    Clinical Impression Statement  Joshua [name] reports good improvement from pain the day after treatment however the pain returned after cutting a watermelon. Did have some tightness in the deltoid and all through the front of the body.  RTC fatigued with strengthening. No goals met today however making progress to them.     Rehab Potential  Good    PT Frequency  2x / week    PT Duration  6 weeks    PT Treatment/Interventions  Patient/family education;ADLs/Self Care Home Management;Cryotherapy;Electrical Stimulation;Iontophoresis 105m/ml Dexamethasone;Moist Heat;Ultrasound;Dry needling;Manual techniques;Neuromuscular re-education;Therapeutic activities;Therapeutic exercise    PT Next Visit Plan  assess deltoid tightness, upper back and RTC strengthening       Patient will benefit from skilled therapeutic intervention in order to improve the following deficits and impairments:  Postural dysfunction, Improper body mechanics, Pain, Increased fascial restricitons, Increased muscle spasms, Decreased strength, Decreased range of motion, Decreased activity tolerance  Visit Diagnosis: Acute pain of right shoulder  Other symptoms and signs involving the musculoskeletal system  Abnormal posture     Problem List Patient Active Problem List   Diagnosis Date Noted  . History of adenomatous polyp of colon 03/03/2018  . RBBB 06/09/2012  . Nephrolithiasis 05/22/2011  . Other and unspecified hyperlipidemia 11/20/2009  . DDD (degenerative disc disease), cervical 05/13/2008  . Impaired fasting glucose 10/27/2007    SJeral PinchPT  03/31/2018, 9:28 AM  [facility] [address]Phone: [phone]  Fax:  [phone] Name: Joshua SalahuddinMRN: [id number] of Birth: 104-05-1946

## 2018-04-07 ENCOUNTER — Ambulatory Visit (INDEPENDENT_AMBULATORY_CARE_PROVIDER_SITE_OTHER): Payer: Medicare HMO | Admitting: Physical Therapy

## 2018-04-07 DIAGNOSIS — M25511 Pain in right shoulder: Secondary | ICD-10-CM | POA: Diagnosis not present

## 2018-04-07 DIAGNOSIS — R29898 Other symptoms and signs involving the musculoskeletal system: Secondary | ICD-10-CM | POA: Diagnosis not present

## 2018-04-07 DIAGNOSIS — R293 Abnormal posture: Secondary | ICD-10-CM | POA: Diagnosis not present

## 2018-04-07 NOTE — Therapy (Addendum)
[place], [place], [address] Phone: [phone]   Fax:  [phone]  Physical Therapy Treatment  Patient Details  Name: Joshua [name] MRN: [id number] Date of Birth: July 12, 1946 Referring Provider: Dr [name]   Encounter Date: 04/07/2018  PT End of Session - 04/07/18 0804    Visit Number  8    Number of Visits  12    Date for PT Re-Evaluation  04/20/18    PT Start Time  0804    PT Stop Time  0843    PT Time Calculation (min)  39 min       Past Medical History:  Diagnosis Date  . Hyperlipidemia   . Kidney stone     Past Surgical History:  Procedure Laterality Date  . HEMORRHOID SURGERY      There were no vitals filed for this visit.  Subjective Assessment - 04/07/18 0804    Subjective  Shoulder is doing pretty good, was able to drive a long distance without any pain    Currently in Pain?  No/denies         [facility] PT Assessment - 04/07/18 0001      Observation/Other Assessments   Focus on Therapeutic Outcomes (FOTO)   35% limited      AROM   Right Shoulder Flexion  165 Degrees    Right Shoulder ABduction  173 Degrees    Right Shoulder Internal Rotation  67 Degrees pain anterior shoulder , behind back ~ T9    Right Shoulder External Rotation  83 Degrees      Strength   Overall Strength Comments  -- Rt shoulder ER 5-/5, no pain                    [facility] Adult PT Treatment/Exercise - 04/07/18 0001      Self-Care   Other Self-Care Comments   pt to add in supine shoulder exercise with yellow band from previous session, if tolerates well will increase to red band      Shoulder Exercises: Seated   Other Seated Exercises  full can, 2#, 10 reps each, bilat , single and alternating      Shoulder Exercises: Sidelying   External Rotation  Strengthening;Right;Weights 3x10 reps    Other Sidelying Exercises  3x10 empty can in small ROM 2#.       Shoulder Exercises:  ROM/Strengthening   UBE (Upper Arm Bike)  L2x3' alt FWD/BWD stopped going FWD early due to beginning to feel some pain.       Shoulder Exercises: Stretch   Other Shoulder Stretches  doorway stretch 3 way ,30 sec each.     Other Shoulder Stretches  supine stretches over black bolster arms overhead and in T positions, then over yoga blocks      Modalities   Modalities  Ultrasound      Ultrasound   Ultrasound Location  Rt deltoid    Ultrasound Parameters  combo Us/stim 100%, 1.5w/cm2, 1.74mz    Ultrasound Goals  Pain;Other (Comment) tone                  PT Long Term Goals - 04/07/18 08032     PT LONG TERM GOAL #1   Title  Improve posture and alignment with patient to demonstrate good upright posture with posterior shoulder girdle engaged. 04/20/18    Status  On-going at rest still had rounded shoulders - able to correct with cues.  PT LONG TERM GOAL #2   Title  Full pain free ROM Rt shoulder to enable patient to return to normal functional activities 04/20/18    Status  Achieved      PT LONG TERM GOAL #3   Title  Decrease frequency, intensity and duration of Rt shoudler pain by 50-75% allowing patient to return to normal functional activities 04/20/18    Status  Partially Met 100% pain improvement  however has not returned to all his activities      PT LONG TERM GOAL #4   Title  Independent in HEP 04/20/18    Status  Achieved      PT LONG TERM GOAL #5   Title  Improve FOTO to </= 30% limitation 04/20/18    Status  On-going 36% limited            Plan - 04/07/18 0841    Clinical Impression Statement  Avyukth has made great progress with pain reduction.  He has not returned to all his previous activities yet, was encouraged to start this.  Partially met his goals and is pleased.  He requested to be placed on hold for two weeks    Rehab Potential  Good    PT Frequency  2x / week    PT Duration  6 weeks    PT Next Visit Plan  on hold until 6/19,if no call will  discharge     Consulted and Agree with Plan of Care  Patient       Patient will benefit from skilled therapeutic intervention in order to improve the following deficits and impairments:  Postural dysfunction, Improper body mechanics, Pain, Increased fascial restricitons, Increased muscle spasms, Decreased strength, Decreased range of motion, Decreased activity tolerance  Visit Diagnosis: Acute pain of right shoulder  Other symptoms and signs involving the musculoskeletal system  Abnormal posture     Problem List Patient Active Problem List   Diagnosis Date Noted  . History of adenomatous polyp of colon 03/03/2018  . RBBB 06/09/2012  . Nephrolithiasis 05/22/2011  . Other and unspecified hyperlipidemia 11/20/2009  . DDD (degenerative disc disease), cervical 05/13/2008  . Impaired fasting glucose 10/27/2007    Joshua [name] PT 04/07/2018, 9:49 AM  [facility] [place], [place], [address] Phone: [phone]   Fax:  [phone]  Name: [name] MRN: [id number] Date of Birth: 02/20/1946   PHYSICAL THERAPY DISCHARGE SUMMARY  Visits from Start of Care: 8  Current functional level related to goals / functional outcomes: Patient had been doing well at this last visit.  He requested to be placed on hold.  If he had concerns he would call.     Remaining deficits: Unknown currently if he had deficits   Education / Equipment: HEP Plan: Patient agrees to discharge.  Patient goals were partially met. Patient is being discharged due to being pleased with the current functional level. as of his last visit with [place].  ?????    Joshua [name], PT 04/23/18 11:12 AM

## 2018-04-14 ENCOUNTER — Ambulatory Visit: Payer: Medicare HMO | Admitting: Family Medicine

## 2018-07-07 ENCOUNTER — Other Ambulatory Visit: Payer: Self-pay | Admitting: Internal Medicine

## 2018-07-07 DIAGNOSIS — E041 Nontoxic single thyroid nodule: Secondary | ICD-10-CM

## 2018-07-10 ENCOUNTER — Ambulatory Visit (INDEPENDENT_AMBULATORY_CARE_PROVIDER_SITE_OTHER): Payer: Medicare HMO

## 2018-07-10 DIAGNOSIS — E041 Nontoxic single thyroid nodule: Secondary | ICD-10-CM | POA: Diagnosis not present

## 2018-10-05 ENCOUNTER — Ambulatory Visit (INDEPENDENT_AMBULATORY_CARE_PROVIDER_SITE_OTHER): Payer: Medicare HMO | Admitting: Family Medicine

## 2018-10-05 ENCOUNTER — Encounter: Payer: Self-pay | Admitting: Family Medicine

## 2018-10-05 VITALS — BP 145/87 | HR 57 | Ht 71.0 in | Wt 232.0 lb

## 2018-10-05 DIAGNOSIS — Z23 Encounter for immunization: Secondary | ICD-10-CM | POA: Diagnosis not present

## 2018-10-05 DIAGNOSIS — M7581 Other shoulder lesions, right shoulder: Secondary | ICD-10-CM | POA: Diagnosis not present

## 2018-10-05 MED ORDER — DICLOFENAC SODIUM 1 % TD GEL: 2.0000 g | Freq: Four times a day (QID) | TRANSDERMAL | 11 refills | Status: DC

## 2018-10-05 NOTE — Patient Instructions (Addendum)
Thank you for coming in today. We will order a MRI and get it done soon.  You should hear about MRI scheduling soon.  Let me know if you do not hear anything in 1 week.  Return a few days after the MRI.  We will discuss results then.    Rotator Cuff Injury Rotator cuff injury is any type of injury to the set of muscles and tendons that make up the stabilizing unit of your shoulder. This unit holds the ball of your upper arm bone (humerus) in the socket of your shoulder blade (scapula). What are the causes? Injuries to your rotator cuff most commonly come from sports or activities that cause your arm to be moved repeatedly over your head. Examples of this include throwing, weight lifting, swimming, or racquet sports. Long lasting (chronic) irritation of your rotator cuff can cause soreness and swelling (inflammation), bursitis, and eventual damage to your tendons, such as a tear (rupture). What are the signs or symptoms? Acute rotator cuff tear:  Sudden tearing sensation followed by severe pain shooting from your upper shoulder down your arm toward your elbow.  Decreased range of motion of your shoulder because of pain and muscle spasm.  Severe pain.  Inability to raise your arm out to the side because of pain and loss of muscle power (large tears).  Chronic rotator cuff tear:  Pain that usually is worse at night and may interfere with sleep.  Gradual weakness and decreased shoulder motion as the pain worsens.  Decreased range of motion.  Rotator cuff tendinitis:  Deep ache in your shoulder and the outside upper arm over your shoulder.  Pain that comes on gradually and becomes worse when lifting your arm to the side or turning it inward.  How is this diagnosed? Rotator cuff injury is diagnosed through a medical history, physical exam, and imaging exam. The medical history helps determine the type of rotator cuff injury. Your health care provider will look at your injured  shoulder, feel the injured area, and ask you to move your shoulder in different positions. X-ray exams typically are done to rule out other causes of shoulder pain, such as fractures. MRI is the exam of choice for the most severe shoulder injuries because the images show muscles and tendons. How is this treated? Chronic tear:  Medicine for pain, such as acetaminophen or ibuprofen.  Physical therapy and range-of-motion exercises may be helpful in maintaining shoulder function and strength.  Steroid injections into your shoulder joint.  Surgical repair of the rotator cuff if the injury does not heal with noninvasive treatment.  Acute tear:  Anti-inflammatory medicines such as ibuprofen and naproxen to help reduce pain and swelling.  A sling to help support your arm and rest your rotator cuff muscles. Long-term use of a sling is not advised. It may cause significant stiffening of the shoulder joint.  Surgery may be considered within a few weeks, especially in younger, active people, to return the shoulder to full function.  Indications for surgical treatment include the following: ? Age younger than 78 years. ? Rotator cuff tears that are complete. ? Physical therapy, rest, and anti-inflammatory medicines have been used for 6-8 weeks, with no improvement. ? Employment or sporting activity that requires constant shoulder use.  Tendinitis:  Anti-inflammatory medicines such as ibuprofen and naproxen to help reduce pain and swelling.  A sling to help support your arm and rest your rotator cuff muscles. Long-term use of a sling is not advised. It  may cause significant stiffening of the shoulder joint.  Severe tendinitis may require: ? Steroid injections into your shoulder joint. ? Physical therapy. ? Surgery.  Follow these instructions at home:  Apply ice to your injury: ? Put ice in a plastic bag. ? Place a towel between your skin and the bag. ? Leave the ice on for 20 minutes, 2-3  times a day.  If you have a shoulder immobilizer (sling and straps), wear it until told otherwise by your health care provider.  You may want to sleep on several pillows or in a recliner at night to lessen swelling and pain.  Only take over-the-counter or prescription medicines for pain, discomfort, or fever as directed by your health care provider.  Do simple hand squeezing exercises with a soft rubber ball to decrease hand swelling. Contact a health care provider if:  Your shoulder pain increases, or new pain or numbness develops in your arm, hand, or fingers.  Your hand or fingers are colder than your other hand. Get help right away if:  Your arm, hand, or fingers are numb or tingling.  Your arm, hand, or fingers are increasingly swollen and painful, or they turn white or blue. This information is not intended to replace advice given to you by your health care provider. Make sure you discuss any questions you have with your health care provider. Document Released: 10/18/2000 Document Revised: 03/28/2016 Document Reviewed: 06/02/2013 Elsevier Interactive Patient Education  2018 [employer].

## 2018-10-05 NOTE — Progress Notes (Addendum)
Joshua [name] is a 72 y.o. male who presents to [facility] today for follow-up on right shoulder pain. Joshua [name] first presented with right shoulder pain in April 2019 after he felt something in his shoulder pop while doing yard work. He has attended PT and finished in June. Since then, he has been doing his exercises at home, using a TENS unit, and applying lidocaine patches. He reports that his shoulder has not gotten any better and that it is hard to sleep at night. He also notes having severe shoulder pain during a recent head MRI at the [place]. He has had greater than 6 weeks of physician directed therapy with little improvement.   ROS:  As above  Exam:  BP (!) 145/87   Pulse (!) 57   Ht 5\' 11"  (1.803 m)   Wt 232 lb (105.2 kg)   BMI 32.36 kg/m  General: Well Developed, well nourished, and in no acute distress.  Neuro/Psych: Alert and oriented x3, extra-ocular muscles intact, able to move all 4 extremities, sensation grossly intact. Skin: Warm and dry, no rashes noted.  Respiratory: Not using accessory muscles, speaking in full sentences, trachea midline.  Cardiovascular: Pulses palpable, no extremity edema. Abdomen: Does not appear distended. MSK:  Right shoulder: No erythema on inspection. No tenderness to palpation. Limited ROM due to pain.  Limited abduction and internal rotation due to pain.  Positive empty can test.  Positive Hawkins test. Pulses and capillary refill normal.   Left shoulder: normal-appearing, nontender, with full range of motion. Strength intact. Pulses and capillary refill normal.     Lab and Radiology Results Right shoulder x-ray 04/29: EXAM: RIGHT SHOULDER - 2+ VIEW  COMPARISON:  Right humerus series performed today.  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 Small subacromial spur. Glenohumeral joint is maintained. No fracture, subluxation  or dislocation.  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 No acute bony abnormality.   Electronically Signed   By: Rolm Baptise M.D.   On: 03/02/2018 10:33   Right humerus x-ray 04/29: EXAM: RIGHT HUMERUS - 2+ VIEW  COMPARISON:  Right shoulder series performed today.  FINDINGS: There is no evidence of fracture or other focal bone lesions. Soft tissues are unremarkable.  IMPRESSION: Negative.   Electronically Signed   By: Rolm Baptise M.D.   On: 03/02/2018 10:34 I personally (independently) visualized and performed the interpretation of the images attached in this note.    Assessment and Plan: 72 y.o. male with  Right rotator cuff tendonitis: Joshua [name] has not improved after having PT and doing at home exercises and TENS unit. Prescribed Voltaren gel for pain. Will order MRI and plan for follow-up appointment a few days later to discuss results. Will consider injection at follow-up appointment after MRI.    MRI as patient would consider surgery and for surgical problem.  Patient is failed greater than 6 weeks of physician directed conservative management.  Additionally patient due for pneumococcal 13 vaccine.  Prevnar 13 vaccine given.  Will update PCP.  CC:  Gerome Sam, MD Phone: [phone]; Fax: [phone]      Orders Placed This Encounter  Procedures  . MR Shoulder Right Wo Contrast    Standing Status:   Future    Standing Expiration Date:   12/07/2019    Order Specific Question:   What is the patient's sedation requirement?    Answer:   No Sedation    Order  Specific Question:   Does the patient have a pacemaker or implanted devices?    Answer:   No    Order Specific Question:   Preferred imaging location?    Answer:   Product/process development scientist (table limit-350lbs)    Order Specific Question:   Radiology Contrast Protocol - do NOT remove file path    Answer:   [email or web address]  .  Pneumococcal conjugate vaccine 13-valent   Meds ordered this encounter  Medications  . diclofenac sodium (VOLTAREN) 1 % GEL    Sig: Apply 2 g topically 4 (four) times daily. To affected joint.    Dispense:  100 g    Refill:  11    Historical information moved to improve visibility of documentation.  Past Medical History:  Diagnosis Date  . Hyperlipidemia   . Kidney stone    Past Surgical History:  Procedure Laterality Date  . HEMORRHOID SURGERY     Social History   Tobacco Use  . Smoking status: Former Research scientist (life sciences)  . Smokeless tobacco: Never Used  Substance Use Topics  . Alcohol use: No   family history is not on file.  Medications: Current Outpatient Medications  Medication Sig Dispense Refill  . aspirin 81 MG chewable tablet Chew 81 mg by mouth daily.    Marland Kitchen FLUoxetine (PROZAC) 10 MG tablet Take 10 mg by mouth daily.    Marland Kitchen HYDROcodone-acetaminophen (NORCO/VICODIN) 5-325 MG tablet Take 1 tablet by mouth every 4 (four) hours as needed. 10 tablet 0  . simvastatin (ZOCOR) 40 MG tablet Take 40 mg by mouth daily.    . Tamsulosin HCl (FLOMAX PO) Take by mouth. PRN KIDNEY STONES    . diclofenac sodium (VOLTAREN) 1 % GEL Apply 2 g topically 4 (four) times daily. To affected joint. 100 g 11   No current facility-administered medications for this visit.    No Known Allergies    Discussed warning signs or symptoms. Please see discharge instructions. Patient expresses understanding.  I personally was present and performed or re-performed the history, physical exam and medical decision-making activities of this service and have verified that the service and findings are accurately documented in the student's note. ___________________________________________ [name] M.D., ABFM., CAQSM. Primary Care and Sports Medicine Adjunct Instructor of [employer]

## 2018-10-12 ENCOUNTER — Ambulatory Visit (INDEPENDENT_AMBULATORY_CARE_PROVIDER_SITE_OTHER): Payer: Medicare HMO

## 2018-10-12 DIAGNOSIS — M7581 Other shoulder lesions, right shoulder: Secondary | ICD-10-CM | POA: Diagnosis not present

## 2018-10-14 ENCOUNTER — Encounter: Payer: Self-pay | Admitting: Family Medicine

## 2018-10-14 ENCOUNTER — Ambulatory Visit (INDEPENDENT_AMBULATORY_CARE_PROVIDER_SITE_OTHER): Payer: No Typology Code available for payment source | Admitting: Family Medicine

## 2018-10-14 VITALS — BP 150/83 | HR 50 | Ht 71.0 in | Wt 236.0 lb

## 2018-10-14 DIAGNOSIS — M75111 Incomplete rotator cuff tear or rupture of right shoulder, not specified as traumatic: Secondary | ICD-10-CM

## 2018-10-14 HISTORY — DX: Incomplete rotator cuff tear or rupture of right shoulder, not specified as traumatic: M75.111

## 2018-10-14 NOTE — Patient Instructions (Signed)
Thank you for coming in today. You should hear from Dr Rich Fuchs office soon.  He is at [facility].  Let me know if you cannot get an appointment soon or you never hear anything.    Surgery for Rotator Cuff Tear, Care After Refer to this sheet in the next few weeks. These instructions provide you with information about caring for yourself after your procedure. Your health care provider may also give you more specific instructions. Your treatment has been planned according to current medical practices, but problems sometimes occur. Call your health care provider if you have any problems or questions after your procedure. What can I expect after the procedure? After the procedure, it is common to have:  Swelling.  Pain.  Stiffness.  Tenderness.  Follow these instructions at home: If you have a sling:  Wear the sling as told by your health care provider. Remove it only as told by your health care provider.  Loosen the sling if your fingers tingle, become numb, or turn cold and blue.  Do not let your sling get wet if it is not waterproof.  Keep the sling clean. Bathing  Do not take baths, swim, or use a hot tub until your health care provider approves. Ask your health care provider if you can take showers. You may only be allowed to take sponge baths for bathing.  Keep your bandage (dressing) dry until your health care provider says it can be removed. Incision care  Follow instructions from your health care provider about how to take care of your incision. Make sure you: ? Wash your hands with soap and water before you change your dressing. If soap and water are not available, use hand sanitizer. ? Change your dressing as told by your health care provider. ? Leave stitches (sutures), skin glue, or adhesive strips in place. These skin closures may need to stay in place for 2 weeks or longer. If adhesive strip edges start to loosen and curl up, you may trim the loose  edges. Do not remove adhesive strips completely unless your health care provider tells you to do that.  Check your incision area every day for signs of infection. Check for: ? More redness, swelling, or pain. ? More fluid or blood. ? Warmth. ? Pus or a bad smell. Managing pain, stiffness, and swelling   If directed, put ice on your shoulder area. ? Put ice in a plastic bag. ? Place a towel between your skin and the bag. ? Leave the ice on for 20 minutes, 2-3 times a day.  Move your fingers often to avoid stiffness and to lessen swelling.  Raise (elevate) your upper body on pillows when you lie down and when you sleep. ? Do not sleep on the front of your body (abdomen). ? Do not sleep on the side that your surgery was performed on. Driving  Do not drive for 24 hours if you received a medicine to help you relax (sedative) during your procedure.  Do not drive or operate heavy machinery while taking prescription pain medicine.  Ask your health care provider when it is safe for you to drive. Activity  Do not use your arm to support your body weight until your health care provider approves.  Do not lift or hold anything with your arm until your health care provider approves.  Return to your normal activities as told by your health care provider. Ask your health care provider what activities are safe for you.  Do exercises as told by your health care provider. General instructions   Do not use any tobacco products, such as cigarettes, chewing tobacco, or e-cigarettes. Tobacco can delay healing. If you need help quitting, ask your health care provider.  Take over-the-counter and prescription medicines only as told by your health care provider.  If you were prescribed an antibiotic medicine, take it as told by your health care provider. Do not stop taking the antibiotic even if you start to feel better.  Keep all follow-up visits as told by your health care provider. This is  important. Contact a health care provider if:  You have a fever.  You have more redness, swelling, or pain around your incision.  You have more fluid or blood coming from your incision.  Your incision feels warm to the touch.  You have pus or a bad smell coming from your incision.  You have pain that gets worse or does not get better with medicine. Get help right away if:  You have severe pain.  You lose feeling in your arm or hand.  Your hand or fingers turn very pale or blue. This information is not intended to replace advice given to you by your health care provider. Make sure you discuss any questions you have with your health care provider. Document Released: 10/21/2005 Document Revised: 06/26/2016 Document Reviewed: 11/04/2015 Elsevier Interactive Patient Education  [employer].

## 2018-10-14 NOTE — Progress Notes (Signed)
Joshua [name] is a 72 y.o. male who presents to [facility] today for follow-up right shoulder pain.  Joshua [name] has been seen several times this year for right shoulder pain.  He was seen earlier this year April on injury of his right shoulder and did very well with physical therapy.  He was somewhat lost to follow-up after completing physical therapy in June and reestablished were rechecked on December 2.  At that point he had continued pain and weakness in his right shoulder especially with abduction.  I was suspicious for rotator cuff tear and ordered MRI which unfortunately showed significant supraspinatus rotator cuff tear.  He had full-thickness tear but fortunately not the full breath of the tendon and he did not have severe retraction or muscle atrophy.  He is here today for follow-up.    ROS:  As above  Exam:  BP (!) 150/83   Pulse (!) 50   Ht 5\' 11"  (1.803 m)   Wt 236 lb (107 kg)   BMI 32.92 kg/m  General: Well Developed, well nourished, and in no acute distress.  Neuro/Psych: Alert and oriented x3, extra-ocular muscles intact, able to move all 4 extremities, sensation grossly intact. Skin: Warm and dry, no rashes noted.  Respiratory: Not using accessory muscles, speaking in full sentences, trachea midline.  Cardiovascular: Pulses palpable, no extremity edema. Abdomen: Does not appear distended. MSK: Right shoulder normal-appearing nontender pain with abduction.  Limited abduction range of motion.    Lab and Radiology Results No results found for this or any previous visit (from the past 72 hour(s)). Mr Shoulder Right Wo Contrast  Result Date: 10/12/2018 CLINICAL DATA:  72 year old with right shoulder pain for 5-6 months radiating into the arm. Limited range of motion. No acute injury or prior relevant surgery. EXAM: MRI OF THE RIGHT SHOULDER WITHOUT CONTRAST TECHNIQUE: Multiplanar, multisequence MR imaging of the shoulder  was performed. No intravenous contrast was administered. COMPARISON:  Radiographs 03/02/2018 FINDINGS: Rotator cuff: There is a full-thickness, nearly complete insertional tear of the supraspinatus tendon. There is approximately 2.7 cm of tendon retraction. The cuff defect measures 2.1 cm AP on sagittal image 15/7. There is mild infraspinatus and subscapularis tendinosis. The teres minor tendon appears normal. Muscles: No focal muscular atrophy. There is some edema tracking along the articular surface of the infraspinatus muscle. Biceps long head:  Intact and normally positioned. Acromioclavicular Joint: The acromion is type 2. There are mild-to-moderate acromioclavicular degenerative changes. A moderate amount of fluid is present in the subacromial-subdeltoid bursa. Glenohumeral Joint: Mild glenohumeral degenerative changes. Labrum:  No evidence of labral tear. Bones: No acute or significant extra-articular osseous findings. Other: No significant soft tissue findings. IMPRESSION: 1. Full-thickness, nearly complete insertional tear of the supraspinatus tendon with mild tendon retraction and fluid in the subacromial-subdeltoid bursa. No significant muscular atrophy. 2. Mild infraspinatus and subscapularis tendinosis without evidence of tear. 3. Mild-to-moderate acromioclavicular degenerative changes. Electronically Signed   By: Richardean Sale M.D.   On: 10/12/2018 13:56   I personally (independently) visualized and performed the interpretation of the images attached in this note.     Assessment and Plan: 72 y.o. male with right shoulder pain due to supraspinatus full-thickness tear.  I believe patient to be a good surgical candidate for rotator cuff repair.  He does not have severe glenohumeral DJD and does not have a severe retracted atrophied rotator cuff tendon.  Lengthy discussion today about options including watchful waiting continue trials of  conservative management and surgical options.  But the  patient and I believe he is a good candidate to evaluate for surgery.  Plan to refer to orthopedics.  Recheck as needed.  I spent 25 minutes with this patient, greater than 50% was face-to-face time counseling regarding MRI findings, diagnosis, treatment plan and options..     Orders Placed This Encounter  Procedures  . Ambulatory referral to Orthopedic Surgery    Referral Priority:   Routine    Referral Type:   Surgical    Referral Reason:   Specialty Services Required    Referred to Provider:   Hiram Gash, MD    Requested Specialty:   Orthopedic Surgery    Number of Visits Requested:   1   No orders of the defined types were placed in this encounter.   Historical information moved to improve visibility of documentation.  Past Medical History:  Diagnosis Date  . Hyperlipidemia   . Kidney stone    Past Surgical History:  Procedure Laterality Date  . HEMORRHOID SURGERY     Social History   Tobacco Use  . Smoking status: Former Research scientist (life sciences)  . Smokeless tobacco: Never Used  Substance Use Topics  . Alcohol use: No   family history is not on file.  Medications: Current Outpatient Medications  Medication Sig Dispense Refill  . aspirin 81 MG chewable tablet Chew 81 mg by mouth daily.    . diclofenac sodium (VOLTAREN) 1 % GEL Apply 2 g topically 4 (four) times daily. To affected joint. 100 g 11  . FLUoxetine (PROZAC) 10 MG tablet Take 10 mg by mouth daily.    Marland Kitchen HYDROcodone-acetaminophen (NORCO/VICODIN) 5-325 MG tablet Take 1 tablet by mouth every 4 (four) hours as needed. 10 tablet 0  . simvastatin (ZOCOR) 40 MG tablet Take 40 mg by mouth daily.    . Tamsulosin HCl (FLOMAX PO) Take by mouth. PRN KIDNEY STONES     No current facility-administered medications for this visit.    No Known Allergies    Discussed warning signs or symptoms. Please see discharge instructions. Patient expresses understanding.

## 2018-10-19 ENCOUNTER — Other Ambulatory Visit: Payer: No Typology Code available for payment source

## 2019-01-04 ENCOUNTER — Other Ambulatory Visit: Payer: Self-pay

## 2019-01-04 ENCOUNTER — Ambulatory Visit (INDEPENDENT_AMBULATORY_CARE_PROVIDER_SITE_OTHER): Payer: Medicare HMO | Admitting: Physical Therapy

## 2019-01-04 ENCOUNTER — Encounter: Payer: Self-pay | Admitting: Physical Therapy

## 2019-01-04 DIAGNOSIS — M25511 Pain in right shoulder: Secondary | ICD-10-CM | POA: Diagnosis not present

## 2019-01-04 DIAGNOSIS — R293 Abnormal posture: Secondary | ICD-10-CM | POA: Diagnosis not present

## 2019-01-04 DIAGNOSIS — M6281 Muscle weakness (generalized): Secondary | ICD-10-CM

## 2019-01-04 DIAGNOSIS — M25611 Stiffness of right shoulder, not elsewhere classified: Secondary | ICD-10-CM | POA: Diagnosis not present

## 2019-01-04 NOTE — Patient Instructions (Signed)
Access Code: [email or web address]  URL: [email or web address]  Date: 01/04/2019  Prepared by: Faustino Congress   Exercises  Supine Shoulder Flexion AAROM with Hands Clasped - 10 reps - 1 sets - 2x daily - 7x weekly  Supine Shoulder Flexion with Dowel - 10 reps - 1 sets - 3-5 sec hold - 2x daily - 7x weekly  Supine Shoulder External Rotation with Dowel - 10 reps - 1 sets - 1-2 sec hold - 2x daily - 7x weekly  Seated Shoulder Flexion Towel Slide at Table Top - 10 reps - 1 sets - 2x daily - 7x weekly  Standing Shoulder Internal Rotation AAROM with Dowel - 10 reps - 1 sets - 2x daily - 7x weekly  Shoulder Scaption AAROM with Dowel - 15 reps - 1 sets - 2x daily - 7x weekly  Standing Backward Shoulder Rolls - 10 reps - 1 sets - 2x daily - 7x weekly  Seated Scapular Retraction - 10 reps - 1 sets - 5 sec hold - 2x daily - 7x weekly

## 2019-01-04 NOTE — Therapy (Signed)
[facility] [place], [place], [address] Phone: [phone]   Fax:  [phone]  Physical Therapy Evaluation  Patient Details  Name: Joshua [name] MRN: [id number] Date of Birth: 19-Mar-1946 Referring Provider (PT): Ophelia Charter, MD   Encounter Date: 01/04/2019  PT End of Session - 01/04/19 1033    Visit Number  1    Number of Visits  20    Date for PT Re-Evaluation  03/01/19    Authorization Type  [employer]    PT Start Time  0800    PT Stop Time  0852    PT Time Calculation (min)  52 min    Activity Tolerance  Patient tolerated treatment well    Behavior During Therapy  [facility] for tasks assessed/performed       Past Medical History:  Diagnosis Date  . Hyperlipidemia   . Kidney stone     Past Surgical History:  Procedure Laterality Date  . HEMORRHOID SURGERY      There were no vitals filed for this visit.   Subjective Assessment - 01/04/19 0802    Subjective  Pt is a 73 y/o male who presents to OPPT s/p Rt RTC repair with extensive debridement, biceps tenodesis and SAD on 11/09/18.  Pt presents today for PT following surgery.    Limitations  Lifting    Patient Stated Goals  regain mobility, decrease pain    Currently in Pain?  Yes    Pain Score  2    up to 4/10   Pain Location  Shoulder    Pain Orientation  Right    Pain Descriptors / Indicators  Discomfort;Aching;Dull;Sharp    Pain Type  Surgical pain    Pain Onset  1 to 4 weeks ago    Pain Frequency  Constant    Aggravating Factors   overuse of arm    Pain Relieving Factors  rest, ice          [facility] PT Assessment - 01/04/19 0806      Assessment   Medical Diagnosis  Rt RTC repair     Referring Provider (PT)  Ophelia Charter, MD    Onset Date/Surgical Date  11/09/18    Hand Dominance  Right    Next MD Visit  April 2020    Prior Therapy  at this clinic prior to surgery      Precautions   Precautions  Shoulder    Type of Shoulder  Precautions  follow protocol      Restrictions   Weight Bearing Restrictions  No      Balance Screen   Has the patient fallen in the past 6 months  No    Has the patient had a decrease in activity level because of a fear of falling?   No    Is the patient reluctant to leave their home because of a fear of falling?   No      Home Film/video editor residence    Living Arrangements  Spouse/significant other    Additional Comments  mod I with ADLs at this time      Prior Function   Level of Independence  Independent    Vocation  Retired    Biomedical scientist  retired from [employer]  reading, walking,      Cognition   Overall Cognitive Status  Within Functional Limits for tasks assessed      Observation/Other  Assessments   Focus on Therapeutic Outcomes (FOTO)   47 (53% limited; predicted 29% limited)      Posture/Postural Control   Posture/Postural Control  Postural limitations    Postural Limitations  Rounded Shoulders;Forward head      ROM / Strength   AROM / PROM / Strength  AROM;PROM;Strength      AROM   AROM Assessment Site  Shoulder    Right/Left Shoulder  Right;Left    Right Shoulder Flexion  130 Degrees    Right Shoulder ABduction  90 Degrees    Right Shoulder Internal Rotation  78 Degrees    Right Shoulder External Rotation  35 Degrees    Left Shoulder Flexion  155 Degrees    Left Shoulder ABduction  180 Degrees    Left Shoulder Internal Rotation  90 Degrees    Left Shoulder External Rotation  77 Degrees      PROM   PROM Assessment Site  Shoulder    Right/Left Shoulder  Right    Right Shoulder Flexion  140 Degrees    Right Shoulder ABduction  108 Degrees    Right Shoulder Internal Rotation  86 Degrees    Right Shoulder External Rotation  46 Degrees      Strength   Overall Strength  Unable to assess;Due to precautions                Objective measurements completed on examination: See above findings.       [facility] Adult PT Treatment/Exercise - 01/04/19 0806      Exercises   Exercises  Shoulder      Shoulder Exercises: Supine   External Rotation  AAROM;Right;10 reps    Flexion  AAROM;10 reps;Right      Shoulder Exercises: Seated   Retraction  Both;10 reps    Flexion  AAROM;Right;5 reps   table slide   Other Seated Exercises  shoulder rolls backwards x 10 reps      Shoulder Exercises: Standing   Internal Rotation  AAROM;Right;5 reps    ABduction  Right;AAROM;10 reps    ABduction Limitations  scaption      Modalities   Modalities  Vasopneumatic      Vasopneumatic   Number Minutes Vasopneumatic   10 minutes    Vasopnuematic Location   Shoulder    Vasopneumatic Pressure  Low    Vasopneumatic Temperature   34             PT Education - 01/04/19 1033    Education Details  HEP    Person(s) Educated  Patient    Methods  Explanation;Demonstration;Handout    Comprehension  Verbalized understanding;Returned demonstration;Need further instruction       PT Short Term Goals - 01/04/19 1037      PT SHORT TERM GOAL #1   Title  independent with initial HEP    Status  New    Target Date  02/01/19      PT SHORT TERM GOAL #2   Title  improve AROM flexion, abduction and ER by at least 10 degrees for improved function     Status  New    Target Date  02/01/19      PT SHORT TERM GOAL #3   Title  report pain < 3/10 with ADLs for improved function    Status  New    Target Date  02/01/19        PT Long Term Goals - 01/04/19 1038      PT LONG  TERM GOAL #1   Title  independent with advanced HEP    Status  New    Target Date  03/01/19      PT LONG TERM GOAL #2   Title  FOTO score improved to </= 29% limited for improved function    Status  New    Target Date  03/01/19      PT LONG TERM GOAL #3   Title  Rt shoulder AROM WFLs for improved function    Status  New    Target Date  03/01/19      PT LONG TERM GOAL #4   Title  demonstrate at least 4/5 Rt shoulder  strength for improved ADLs     Status  New    Target Date  03/01/19      PT LONG TERM GOAL #5   Title  report pain </= 1/10 with activity for improved function    Status  New    Target Date  03/01/19             Plan - 01/04/19 1034    Clinical Impression Statement  Pt is a 73 y/o male who presents to OPPT s/p Rt RTC repair with SAD and biceps tenodesis on 11/09/18.  Pt is now 8 weeks post op and presents today with decreased ROM and strength as well as postural abnormalities and pain affecting functional mobility.  Pt will benefit from PT to address deficits listed.    Examination-Activity Limitations  Bathing;Hygiene/Grooming;Lift;Reach Overhead;Carry;Dressing    Examination-Participation Restrictions  Cleaning;Yard Work;Community Activity    Stability/Clinical Decision Making  Stable/Uncomplicated    Clinical Decision Making  Low    Rehab Potential  Excellent    PT Frequency  3x / week   3x/wk x 4 wks; then 2x/wk x 4 wks   PT Duration  8 weeks    PT Treatment/Interventions  ADLs/Self Care Home Management;Cryotherapy;Ultrasound;Moist Heat;Iontophoresis 4mg /ml Dexamethasone;Electrical Stimulation;Functional mobility training;Neuromuscular re-education;Therapeutic exercise;Therapeutic activities;Patient/family education;Manual techniques;Passive range of motion;Taping;Vasopneumatic Device    PT Next Visit Plan  review AAROM HEP, isometrics, pulleys, manual/modalities PRN, continue per protocol    PT Home Exercise Plan  Access Code: [email or web address]     Consulted and Agree with Plan of Care  Patient       Patient will benefit from skilled therapeutic intervention in order to improve the following deficits and impairments:  Postural dysfunction, Pain, Decreased range of motion, Decreased strength  Visit Diagnosis: Acute pain of right shoulder - Plan: PT plan of care cert/re-cert  Abnormal posture - Plan: PT plan of care cert/re-cert  Stiffness of right shoulder, not elsewhere classified  - Plan: PT plan of care cert/re-cert  Muscle weakness (generalized) - Plan: PT plan of care cert/re-cert     Problem List Patient Active Problem List   Diagnosis Date Noted  . Nontraumatic incomplete tear of right rotator cuff 10/14/2018  . History of adenomatous polyp of colon 03/03/2018  . RBBB 06/09/2012  . Nephrolithiasis 05/22/2011  . Other and unspecified hyperlipidemia 11/20/2009  . DDD (degenerative disc disease), cervical 05/13/2008  . Impaired fasting glucose 10/27/2007      [name], PT, DPT 01/04/19 10:41 AM     [facility] [place], [place], [address] Phone: [phone]   Fax:  [phone]  Name: Joshua [name] MRN: [id number] Date of Birth: 21-Oct-1946

## 2019-01-06 ENCOUNTER — Ambulatory Visit (INDEPENDENT_AMBULATORY_CARE_PROVIDER_SITE_OTHER): Payer: Medicare HMO | Admitting: Physical Therapy

## 2019-01-06 DIAGNOSIS — M6281 Muscle weakness (generalized): Secondary | ICD-10-CM

## 2019-01-06 DIAGNOSIS — M25611 Stiffness of right shoulder, not elsewhere classified: Secondary | ICD-10-CM

## 2019-01-06 DIAGNOSIS — R293 Abnormal posture: Secondary | ICD-10-CM | POA: Diagnosis not present

## 2019-01-06 DIAGNOSIS — M25511 Pain in right shoulder: Secondary | ICD-10-CM | POA: Diagnosis not present

## 2019-01-06 NOTE — Therapy (Signed)
[place], [place], [address] Phone: [phone]   Fax:  [phone]  Physical Therapy Treatment  Patient Details  Name: Joshua [name] [facility] MRN: [id number] Date of Birth: December 01, 1945 Referring Provider (PT): Ophelia Charter, MD   Encounter Date: 01/06/2019  PT End of Session - 01/06/19 1029    Visit Number  2    Number of Visits  20    Date for PT Re-Evaluation  03/01/19    Authorization Type  [employer]    PT Start Time  1019    PT Stop Time  1108    PT Time Calculation (min)  49 min    Activity Tolerance  Patient tolerated treatment well    Behavior During Therapy  [facility] for tasks assessed/performed       Past Medical History:  Diagnosis Date  . Hyperlipidemia   . Kidney stone     Past Surgical History:  Procedure Laterality Date  . HEMORRHOID SURGERY      There were no vitals filed for this visit.  Subjective Assessment - 01/06/19 1028    Subjective  Pt reported he had some pain (up to 3-4/10) after he completed HEP, so he used ice and his pain resolved.  "I could tell I had been doing some exercises."    Patient Stated Goals  regain mobility, decrease pain    Currently in Pain?  No/denies         [facility] PT Assessment - 01/06/19 0001      Assessment   Medical Diagnosis  Rt RTC repair     Referring Provider (PT)  Ophelia Charter, MD    Onset Date/Surgical Date  11/09/18    Hand Dominance  Right    Next MD Visit  April 2020    Prior Therapy  at this clinic prior to surgery       [facility] Adult PT Treatment/Exercise - 01/06/19 0001      Shoulder Exercises: Supine   Flexion  AAROM;10 reps;Right   cane     Shoulder Exercises: Standing   External Rotation  AAROM;Right;10 reps   cane   Internal Rotation  AAROM;Right;10 reps   cane behind back   ABduction  Right;AAROM;10 reps   cane   ABduction Limitations  scaption    Extension  AAROM;Both;10 reps   cane behind back    Retraction   AROM;Both;10 reps   shoulder blade squeeze   Other Standing Exercises  wall ladder x 3 reps ( 2 with flexion to #28, 1 in scaption to #28      Shoulder Exercises: Isometric Strengthening   Flexion  --   5" x 10    Extension  --   5" x 10   External Rotation  --   5" x 10   Internal Rotation  --   5" x 10     Modalities   Modalities  Vasopneumatic      Vasopneumatic   Number Minutes Vasopneumatic   10 minutes    Vasopnuematic Location   Shoulder    Vasopneumatic Pressure  Low    Vasopneumatic Temperature   34 deg              PT Education - 01/06/19 1100    Education Details  updated HEP     Person(s) Educated  Patient    Methods  Explanation;Demonstration;Verbal cues;Handout    Comprehension  Verbalized understanding;Returned demonstration       PT Short  Term Goals - 01/04/19 1037      PT SHORT TERM GOAL #1   Title  independent with initial HEP    Status  New    Target Date  02/01/19      PT SHORT TERM GOAL #2   Title  improve AROM flexion, abduction and ER by at least 10 degrees for improved function     Status  New    Target Date  02/01/19      PT SHORT TERM GOAL #3   Title  report pain < 3/10 with ADLs for improved function    Status  New    Target Date  02/01/19        PT Long Term Goals - 01/04/19 1038      PT LONG TERM GOAL #1   Title  independent with advanced HEP    Status  New    Target Date  03/01/19      PT LONG TERM GOAL #2   Title  FOTO score improved to </= 29% limited for improved function    Status  New    Target Date  03/01/19      PT LONG TERM GOAL #3   Title  Rt shoulder AROM WFLs for improved function    Status  New    Target Date  03/01/19      PT LONG TERM GOAL #4   Title  demonstrate at least 4/5 Rt shoulder strength for improved ADLs     Status  New    Target Date  03/01/19      PT LONG TERM GOAL #5   Title  report pain </= 1/10 with activity for improved function    Status  New    Target Date  03/01/19             Plan - 01/06/19 1112    Clinical Impression Statement  Pt tolerated all exercises within rehab protocol without any increase in symptoms.  Minor cues for form required.   Goals are ongoing at this time.     Rehab Potential  Excellent    PT Frequency  3x / week   see eval   PT Duration  8 weeks    PT Treatment/Interventions  ADLs/Self Care Home Management;Cryotherapy;Ultrasound;Moist Heat;Iontophoresis 4mg /ml Dexamethasone;Electrical Stimulation;Functional mobility training;Neuromuscular re-education;Therapeutic exercise;Therapeutic activities;Patient/family education;Manual techniques;Passive range of motion;Taping;Vasopneumatic Device    PT Next Visit Plan  review AAROM HEP, isometrics, pulleys, manual/modalities PRN, continue per protocol    PT Home Exercise Plan  Access Code: [email or web address]     Consulted and Agree with Plan of Care  Patient       Patient will benefit from skilled therapeutic intervention in order to improve the following deficits and impairments:  Postural dysfunction, Pain, Decreased range of motion, Decreased strength  Visit Diagnosis: Acute pain of right shoulder  Abnormal posture  Stiffness of right shoulder, not elsewhere classified  Muscle weakness (generalized)     Problem List Patient Active Problem List   Diagnosis Date Noted  . Nontraumatic incomplete tear of right rotator cuff 10/14/2018  . History of adenomatous polyp of colon 03/03/2018  . RBBB 06/09/2012  . Nephrolithiasis 05/22/2011  . Other and unspecified hyperlipidemia 11/20/2009  . DDD (degenerative disc disease), cervical 05/13/2008  . Impaired fasting glucose 10/27/2007   Kerin Perna, PTA 01/06/19 11:21 AM  [place], [place], [address] Phone: [phone]   Fax:  [phone]  Name: Morey Andonian  Kohn III MRN: [id number] Date of Birth: [birth year]

## 2019-01-08 ENCOUNTER — Ambulatory Visit (INDEPENDENT_AMBULATORY_CARE_PROVIDER_SITE_OTHER): Payer: Medicare HMO | Admitting: Physical Therapy

## 2019-01-08 ENCOUNTER — Encounter: Payer: Self-pay | Admitting: Physical Therapy

## 2019-01-08 DIAGNOSIS — M6281 Muscle weakness (generalized): Secondary | ICD-10-CM

## 2019-01-08 DIAGNOSIS — R293 Abnormal posture: Secondary | ICD-10-CM

## 2019-01-08 DIAGNOSIS — M25511 Pain in right shoulder: Secondary | ICD-10-CM

## 2019-01-08 DIAGNOSIS — M25611 Stiffness of right shoulder, not elsewhere classified: Secondary | ICD-10-CM

## 2019-01-08 DIAGNOSIS — R29898 Other symptoms and signs involving the musculoskeletal system: Secondary | ICD-10-CM

## 2019-01-08 NOTE — Therapy (Signed)
[place], [place], [address] Phone: [phone]   Fax:  [phone]  Physical Therapy Treatment  Patient Details  Name: Joshua [name] [facility] III MRN: [id number] Date of Birth: Jun 30, 1946 Referring Provider (PT): Ophelia Charter, MD   Encounter Date: 01/08/2019  PT End of Session - 01/08/19 1108    Visit Number  3    Number of Visits  20    Date for PT Re-Evaluation  03/01/19    Authorization Type  [employer]    PT Start Time  1025    PT Stop Time  1116    PT Time Calculation (min)  51 min    Activity Tolerance  Patient tolerated treatment well    Behavior During Therapy  [facility] for tasks assessed/performed       Past Medical History:  Diagnosis Date  . Hyperlipidemia   . Kidney stone     Past Surgical History:  Procedure Laterality Date  . HEMORRHOID SURGERY      There were no vitals filed for this visit.  Subjective Assessment - 01/08/19 1028    Subjective  shoulder is sore from doing more exercises.  overall feeling great though    Patient Stated Goals  regain mobility, decrease pain    Currently in Pain?  No/denies   "just soreness"                      [facility] Adult PT Treatment/Exercise - 01/08/19 1029      Shoulder Exercises: Standing   External Rotation  AAROM;Right;10 reps   cane   Internal Rotation  AAROM;Right;10 reps   cane behind back   Flexion  AAROM;10 reps   with cane   ABduction  Right;AAROM;10 reps   cane   ABduction Limitations  scaption    Extension  AAROM;Both;10 reps   cane behind back    Retraction  AROM;Both;10 reps    Theraband Level (Shoulder Retraction)  Level 2 (Red)    Retraction Limitations  5 sec hold      Shoulder Exercises: Pulleys   Flexion  3 minutes    Scaption  3 minutes      Shoulder Exercises: Isometric Strengthening   Flexion  --   5" x 10    Extension  --   5" x 10   External Rotation  --   5" x 10   Internal Rotation  --    5" x 10     Modalities   Modalities  Vasopneumatic      Vasopneumatic   Number Minutes Vasopneumatic   10 minutes    Vasopnuematic Location   Shoulder    Vasopneumatic Pressure  Low    Vasopneumatic Temperature   34 deg       Manual Therapy   Manual Therapy  Soft tissue mobilization;Passive ROM    Soft tissue mobilization  STM to pecs and upper trap on Rt    Passive ROM  Rt shoulder all motions to tolerance with end range holds               PT Short Term Goals - 01/04/19 1037      PT SHORT TERM GOAL #1   Title  independent with initial HEP    Status  New    Target Date  02/01/19      PT SHORT TERM GOAL #2   Title  improve AROM flexion, abduction and ER by at least 10  degrees for improved function     Status  New    Target Date  02/01/19      PT SHORT TERM GOAL #3   Title  report pain < 3/10 with ADLs for improved function    Status  New    Target Date  02/01/19        PT Long Term Goals - 01/04/19 1038      PT LONG TERM GOAL #1   Title  independent with advanced HEP    Status  New    Target Date  03/01/19      PT LONG TERM GOAL #2   Title  FOTO score improved to </= 29% limited for improved function    Status  New    Target Date  03/01/19      PT LONG TERM GOAL #3   Title  Rt shoulder AROM WFLs for improved function    Status  New    Target Date  03/01/19      PT LONG TERM GOAL #4   Title  demonstrate at least 4/5 Rt shoulder strength for improved ADLs     Status  New    Target Date  03/01/19      PT LONG TERM GOAL #5   Title  report pain </= 1/10 with activity for improved function    Status  New    Target Date  03/01/19            Plan - 01/08/19 1108    Clinical Impression Statement  Pt reports some soreness after adding isometrics to HEP, which is expected with rehab progress.  Progressing well and compliant with exercises at this time.    Rehab Potential  Excellent    PT Frequency  3x / week   see eval   PT Duration  8 weeks     PT Treatment/Interventions  ADLs/Self Care Home Management;Cryotherapy;Ultrasound;Moist Heat;Iontophoresis 4mg /ml Dexamethasone;Electrical Stimulation;Functional mobility training;Neuromuscular re-education;Therapeutic exercise;Therapeutic activities;Patient/family education;Manual techniques;Passive range of motion;Taping;Vasopneumatic Device    PT Next Visit Plan  review AAROM HEP, isometrics, pulleys, manual/modalities PRN, continue per protocol    PT Home Exercise Plan  Access Code: [email or web address]     Consulted and Agree with Plan of Care  Patient       Patient will benefit from skilled therapeutic intervention in order to improve the following deficits and impairments:  Postural dysfunction, Pain, Decreased range of motion, Decreased strength  Visit Diagnosis: Acute pain of right shoulder  Abnormal posture  Stiffness of right shoulder, not elsewhere classified  Muscle weakness (generalized)  Other symptoms and signs involving the musculoskeletal system     Problem List Patient Active Problem List   Diagnosis Date Noted  . Nontraumatic incomplete tear of right rotator cuff 10/14/2018  . History of adenomatous polyp of colon 03/03/2018  . RBBB 06/09/2012  . Nephrolithiasis 05/22/2011  . Other and unspecified hyperlipidemia 11/20/2009  . DDD (degenerative disc disease), cervical 05/13/2008  . Impaired fasting glucose 10/27/2007      [name], PT, DPT 01/08/19 11:10 AM    [facility] [place], [place], [address] Phone: [phone]   Fax:  [phone]  Name: Joshua [name] [facility] III MRN: [id number] Date of Birth: 02-01-1946

## 2019-01-11 ENCOUNTER — Encounter: Payer: Self-pay | Admitting: Physical Therapy

## 2019-01-11 ENCOUNTER — Ambulatory Visit (INDEPENDENT_AMBULATORY_CARE_PROVIDER_SITE_OTHER): Payer: Medicare HMO | Admitting: Physical Therapy

## 2019-01-11 DIAGNOSIS — R293 Abnormal posture: Secondary | ICD-10-CM

## 2019-01-11 DIAGNOSIS — M6281 Muscle weakness (generalized): Secondary | ICD-10-CM

## 2019-01-11 DIAGNOSIS — M25611 Stiffness of right shoulder, not elsewhere classified: Secondary | ICD-10-CM

## 2019-01-11 DIAGNOSIS — R29898 Other symptoms and signs involving the musculoskeletal system: Secondary | ICD-10-CM

## 2019-01-11 DIAGNOSIS — M25511 Pain in right shoulder: Secondary | ICD-10-CM

## 2019-01-11 NOTE — Therapy (Signed)
[place], [place], [address] Phone: [phone]   Fax:  [phone]  Physical Therapy Treatment  Patient Details  Name: Joshua [name] [facility] III MRN: [id number] Date of Birth: [birth year] Referring Provider (PT): Ophelia Charter, MD   Encounter Date: 01/11/2019  PT End of Session - 01/11/19 1035    Visit Number  4    Number of Visits  20    Date for PT Re-Evaluation  03/01/19    Authorization Type  [employer]    PT Start Time  0930    PT Stop Time  1020    PT Time Calculation (min)  50 min    Activity Tolerance  Patient tolerated treatment well    Behavior During Therapy  [facility] for tasks assessed/performed       Past Medical History:  Diagnosis Date  . Hyperlipidemia   . Kidney stone     Past Surgical History:  Procedure Laterality Date  . HEMORRHOID SURGERY      There were no vitals filed for this visit.  Subjective Assessment - 01/11/19 0931    Subjective  no pain, doing well.  overall occasional episodes of soreness.  tried to pick up cat with Rt arm and had some shoulder pain so stopped.    Patient Stated Goals  regain mobility, decrease pain    Currently in Pain?  No/denies         [facility] PT Assessment - 01/11/19 0933      Assessment   Medical Diagnosis  Rt RTC repair     Referring Provider (PT)  Ophelia Charter, MD    Onset Date/Surgical Date  11/09/18    Hand Dominance  Right    Next MD Visit  April 2020    Prior Therapy  at this clinic prior to surgery      AROM   Right Shoulder Flexion  145 Degrees    Right Shoulder ABduction  140 Degrees    Right Shoulder Internal Rotation  86 Degrees    Right Shoulder External Rotation  55 Degrees                   [facility] Adult PT Treatment/Exercise - 01/11/19 0933      Shoulder Exercises: Seated   Other Seated Exercises  scap depression into green physioball 10x5 sec; right      Shoulder Exercises: Prone   Retraction  Right;10 reps     Retraction Limitations  to neutral with 5 sec hold    Extension  Right;10 reps    Extension Limitations  to neutral with 5 sec hold    Horizontal ABduction 1  Right;10 reps    Horizontal ABduction 1 Limitations  to neutral with 5 sec hold      Shoulder Exercises: Standing   External Rotation  AAROM;Right;10 reps   cane   Internal Rotation  AAROM;Right;10 reps   cane behind back   Flexion  AAROM;10 reps   with cane   Flexion Limitations  with mirror to decrease shoulder shrug    ABduction  Right;AAROM;10 reps   cane   ABduction Limitations  scaption; with mirror for feedback    Extension  AAROM;Both;10 reps   cane behind back      Shoulder Exercises: Pulleys   Flexion  3 minutes      Shoulder Exercises: Therapy Ball   Flexion  Both;10 reps    Flexion Limitations  ball on wall; 5 sec hold  Vasopneumatic   Number Minutes Vasopneumatic   10 minutes    Vasopnuematic Location   Shoulder    Vasopneumatic Pressure  Low    Vasopneumatic Temperature   34 deg       Manual Therapy   Manual Therapy  Soft tissue mobilization;Passive ROM    Soft tissue mobilization  STM to pecs and upper trap on Rt    Passive ROM  Rt shoulder all motions to tolerance with end range holds               PT Short Term Goals - 01/11/19 1035      PT SHORT TERM GOAL #1   Title  independent with initial HEP    Status  On-going    Target Date  02/01/19      PT SHORT TERM GOAL #2   Title  improve AROM flexion, abduction and ER by at least 10 degrees for improved function     Status  Achieved    Target Date  02/01/19      PT SHORT TERM GOAL #3   Title  report pain < 3/10 with ADLs for improved function    Status  On-going    Target Date  02/01/19        PT Long Term Goals - 01/04/19 1038      PT LONG TERM GOAL #1   Title  independent with advanced HEP    Status  New    Target Date  03/01/19      PT LONG TERM GOAL #2   Title  FOTO score improved to </= 29% limited for improved  function    Status  New    Target Date  03/01/19      PT LONG TERM GOAL #3   Title  Rt shoulder AROM WFLs for improved function    Status  New    Target Date  03/01/19      PT LONG TERM GOAL #4   Title  demonstrate at least 4/5 Rt shoulder strength for improved ADLs     Status  New    Target Date  03/01/19      PT LONG TERM GOAL #5   Title  report pain </= 1/10 with activity for improved function    Status  New    Target Date  03/01/19            Plan - 01/11/19 1035    Clinical Impression Statement  Pt has met ST ROM goal in first week of PT.  Overall progressing very well with PT within limits of protocol.  Pain minimal at this point.  Progressing well with PT.    Rehab Potential  Excellent    PT Frequency  3x / week   see eval   PT Duration  8 weeks    PT Treatment/Interventions  ADLs/Self Care Home Management;Cryotherapy;Ultrasound;Moist Heat;Iontophoresis 67m/ml Dexamethasone;Electrical Stimulation;Functional mobility training;Neuromuscular re-education;Therapeutic exercise;Therapeutic activities;Patient/family education;Manual techniques;Passive range of motion;Taping;Vasopneumatic Device    PT Next Visit Plan  review AAROM HEP, isometrics, pulleys, manual/modalities PRN, continue per protocol    PT Home Exercise Plan  Access Code: [email or web address]     Consulted and Agree with Plan of Care  Patient       Patient will benefit from skilled therapeutic intervention in order to improve the following deficits and impairments:  Postural dysfunction, Pain, Decreased range of motion, Decreased strength  Visit Diagnosis: Acute pain of right shoulder  Abnormal posture  Stiffness of right shoulder,  not elsewhere classified  Muscle weakness (generalized)  Other symptoms and signs involving the musculoskeletal system     Problem List Patient Active Problem List   Diagnosis Date Noted  . Nontraumatic incomplete tear of right rotator cuff 10/14/2018  . History of adenomatous  polyp of colon 03/03/2018  . RBBB 06/09/2012  . Nephrolithiasis 05/22/2011  . Other and unspecified hyperlipidemia 11/20/2009  . DDD (degenerative disc disease), cervical 05/13/2008  . Impaired fasting glucose 10/27/2007      Joshua [name], PT, DPT 01/11/19 10:37 AM    [facility] [place], [place], [address] Phone: [phone]   Fax:  [phone]  Name: Tarus Briski [facility] III MRN: [id number] Date of Birth: [birth year]

## 2019-01-13 ENCOUNTER — Encounter: Payer: Self-pay | Admitting: Physical Therapy

## 2019-01-13 ENCOUNTER — Other Ambulatory Visit: Payer: Self-pay

## 2019-01-13 ENCOUNTER — Ambulatory Visit (INDEPENDENT_AMBULATORY_CARE_PROVIDER_SITE_OTHER): Payer: Medicare HMO | Admitting: Physical Therapy

## 2019-01-13 DIAGNOSIS — R293 Abnormal posture: Secondary | ICD-10-CM

## 2019-01-13 DIAGNOSIS — M6281 Muscle weakness (generalized): Secondary | ICD-10-CM | POA: Diagnosis not present

## 2019-01-13 DIAGNOSIS — R29898 Other symptoms and signs involving the musculoskeletal system: Secondary | ICD-10-CM

## 2019-01-13 DIAGNOSIS — M25511 Pain in right shoulder: Secondary | ICD-10-CM

## 2019-01-13 DIAGNOSIS — M25611 Stiffness of right shoulder, not elsewhere classified: Secondary | ICD-10-CM

## 2019-01-13 NOTE — Therapy (Signed)
Joshua [name], [place], [address] Phone: [phone]   Fax:  [phone]  Physical Therapy Treatment  Patient Details  Name: Joshua [name] [facility] III MRN: [id number] Date of Birth: [birth year] Referring Provider (PT): Ophelia Charter, MD   Encounter Date: 01/13/2019  PT End of Session - 01/13/19 1149    Visit Number  5    Number of Visits  20    Date for PT Re-Evaluation  03/01/19    Authorization Type  [employer]    PT Start Time  0928    PT Stop Time  1025    PT Time Calculation (min)  57 min    Activity Tolerance  Patient tolerated treatment well    Behavior During Therapy  [facility] for tasks assessed/performed       Past Medical History:  Diagnosis Date  . Hyperlipidemia   . Kidney stone     Past Surgical History:  Procedure Laterality Date  . HEMORRHOID SURGERY      There were no vitals filed for this visit.  Subjective Assessment - 01/13/19 0929    Subjective  doing well; shoulder is a little sore    Patient Stated Goals  regain mobility, decrease pain    Pain Score  2     Pain Location  Shoulder    Pain Orientation  Right    Pain Descriptors / Indicators  Discomfort;Sore;Aching    Pain Type  Surgical pain    Pain Onset  More than a month ago    Pain Frequency  Intermittent    Aggravating Factors   overuse of arm    Pain Relieving Factors  rest, ice         [facility] PT Assessment - 01/13/19 0930      Assessment   Medical Diagnosis  Rt RTC repair     Referring Provider (PT)  Ophelia Charter, MD    Onset Date/Surgical Date  11/09/18    Hand Dominance  Right    Next MD Visit  02/04/2019                   [facility] Adult PT Treatment/Exercise - 01/13/19 0930      Shoulder Exercises: Prone   Retraction  Right;10 reps    Retraction Limitations  to neutral with 5 sec hold    Extension  Right;10 reps    Extension Limitations  to neutral with 5 sec hold    Horizontal ABduction 1  Right;10  reps    Horizontal ABduction 1 Limitations  to neutral with 5 sec hold      Shoulder Exercises: Sidelying   External Rotation  Right;10 reps    ABduction  Right;15 reps    ABduction Limitations  to 90 deg      Shoulder Exercises: Standing   Flexion  AROM;Both;10 reps    ABduction  Right;AROM;10 reps    ABduction Limitations  scaption; with mirror for feedback    Other Standing Exercises  wall ladder flexion and scaption x10 each; no assist to lower      Shoulder Exercises: Pulleys   Flexion  3 minutes    Scaption  3 minutes      Vasopneumatic   Number Minutes Vasopneumatic   15 minutes    Vasopnuematic Location   Shoulder    Vasopneumatic Pressure  Low    Vasopneumatic Temperature   34 deg       Manual Therapy   Manual Therapy  Soft tissue mobilization;Passive ROM    Soft tissue mobilization  STM to pecs and upper trap on Rt    Passive ROM  Rt shoulder all motions to tolerance with end range holds               PT Short Term Goals - 01/11/19 1035      PT SHORT TERM GOAL #1   Title  independent with initial HEP    Status  On-going    Target Date  02/01/19      PT SHORT TERM GOAL #2   Title  improve AROM flexion, abduction and ER by at least 10 degrees for improved function     Status  Achieved    Target Date  02/01/19      PT SHORT TERM GOAL #3   Title  report pain < 3/10 with ADLs for improved function    Status  On-going    Target Date  02/01/19        PT Long Term Goals - 01/04/19 1038      PT LONG TERM GOAL #1   Title  independent with advanced HEP    Status  New    Target Date  03/01/19      PT LONG TERM GOAL #2   Title  FOTO score improved to </= 29% limited for improved function    Status  New    Target Date  03/01/19      PT LONG TERM GOAL #3   Title  Rt shoulder AROM WFLs for improved function    Status  New    Target Date  03/01/19      PT LONG TERM GOAL #4   Title  demonstrate at least 4/5 Rt shoulder strength for improved ADLs      Status  New    Target Date  03/01/19      PT LONG TERM GOAL #5   Title  report pain </= 1/10 with activity for improved function    Status  New    Target Date  03/01/19            Plan - 01/13/19 1151    Clinical Impression Statement  Pt progressing well with PT demonstrating improved function and AROM today.  Will continue to benefit from PT to maximize function.    Rehab Potential  Excellent    PT Frequency  3x / week   see eval   PT Duration  8 weeks    PT Treatment/Interventions  ADLs/Self Care Home Management;Cryotherapy;Ultrasound;Moist Heat;Iontophoresis 4mg /ml Dexamethasone;Electrical Stimulation;Functional mobility training;Neuromuscular re-education;Therapeutic exercise;Therapeutic activities;Patient/family education;Manual techniques;Passive range of motion;Taping;Vasopneumatic Device    PT Next Visit Plan  review AAROM HEP, isometrics, pulleys, manual/modalities PRN, continue per protocol    PT Home Exercise Plan  Access Code: [email or web address]     Consulted and Agree with Plan of Care  Patient       Patient will benefit from skilled therapeutic intervention in order to improve the following deficits and impairments:  Postural dysfunction, Pain, Decreased range of motion, Decreased strength  Visit Diagnosis: Acute pain of right shoulder  Abnormal posture  Stiffness of right shoulder, not elsewhere classified  Muscle weakness (generalized)  Other symptoms and signs involving the musculoskeletal system     Problem List Patient Active Problem List   Diagnosis Date Noted  . Nontraumatic incomplete tear of right rotator cuff 10/14/2018  . History of adenomatous polyp of colon 03/03/2018  . RBBB 06/09/2012  . Nephrolithiasis 05/22/2011  .  Other and unspecified hyperlipidemia 11/20/2009  . DDD (degenerative disc disease), cervical 05/13/2008  . Impaired fasting glucose 10/27/2007      [name], PT, DPT 01/13/19 11:53 AM     [facility] [place], [place], [address] Phone: [phone]   Fax:  [phone]  Name: Joshua [name] [facility] III MRN: [id number] Date of Birth: 1946-10-14

## 2019-01-15 ENCOUNTER — Other Ambulatory Visit: Payer: Self-pay

## 2019-01-15 ENCOUNTER — Encounter: Payer: Self-pay | Admitting: Physical Therapy

## 2019-01-15 ENCOUNTER — Ambulatory Visit (INDEPENDENT_AMBULATORY_CARE_PROVIDER_SITE_OTHER): Payer: Medicare HMO | Admitting: Physical Therapy

## 2019-01-15 DIAGNOSIS — M25511 Pain in right shoulder: Secondary | ICD-10-CM | POA: Diagnosis not present

## 2019-01-15 DIAGNOSIS — R293 Abnormal posture: Secondary | ICD-10-CM

## 2019-01-15 DIAGNOSIS — M25611 Stiffness of right shoulder, not elsewhere classified: Secondary | ICD-10-CM

## 2019-01-15 DIAGNOSIS — R29898 Other symptoms and signs involving the musculoskeletal system: Secondary | ICD-10-CM

## 2019-01-15 DIAGNOSIS — M6281 Muscle weakness (generalized): Secondary | ICD-10-CM | POA: Diagnosis not present

## 2019-01-15 NOTE — Therapy (Signed)
[place], [place], [address] Phone: [phone]   Fax:  [phone]  Physical Therapy Treatment  Patient Details  Name: Joshua [name] [facility] III MRN: [id number] Date of Birth: 1946/10/15 Referring Provider (PT): Ophelia Charter, MD   Encounter Date: 01/15/2019  PT End of Session - 01/15/19 0926    Visit Number  6    Number of Visits  20    Date for PT Re-Evaluation  03/01/19    Authorization Type  [employer]    PT Start Time  [phone]    PT Stop Time  0940    PT Time Calculation (min)  56 min    Activity Tolerance  Patient tolerated treatment well    Behavior During Therapy  [facility] for tasks assessed/performed       Past Medical History:  Diagnosis Date  . Hyperlipidemia   . Kidney stone     Past Surgical History:  Procedure Laterality Date  . HEMORRHOID SURGERY      There were no vitals filed for this visit.  Subjective Assessment - 01/15/19 0848    Subjective  had some pain that started yesterday afternoon; c/o soreness with sharp pains.    Patient Stated Goals  regain mobility, decrease pain    Currently in Pain?  Yes    Pain Score  4    no pain at rest   Pain Location  Shoulder    Pain Orientation  Right    Pain Descriptors / Indicators  Sharp;Aching;Discomfort    Pain Type  Surgical pain    Pain Onset  More than a month ago    Pain Frequency  Intermittent    Aggravating Factors   overuse, reaching    Pain Relieving Factors  rest, ice                       [facility] Adult PT Treatment/Exercise - 01/15/19 0850      Shoulder Exercises: Standing   External Rotation  AAROM;Right;10 reps    External Rotation Limitations  cane    Internal Rotation  AAROM;Right;10 reps    Internal Rotation Limitations  cane behind back    Flexion  Both;10 reps;AAROM    Flexion Limitations  cane    ABduction  AAROM;Right;10 reps    ABduction Limitations  cane; scaption    Extension  AAROM;Both;10  reps   cane behind back     Shoulder Exercises: Pulleys   Flexion  3 minutes    Scaption  3 minutes      Vasopneumatic   Number Minutes Vasopneumatic   15 minutes    Vasopnuematic Location   Shoulder    Vasopneumatic Pressure  Low    Vasopneumatic Temperature   34 deg       Manual Therapy   Manual Therapy  Soft tissue mobilization;Passive ROM    Soft tissue mobilization  STM to pecs and upper trap on Rt    Passive ROM  Rt shoulder all motions to tolerance with end range holds               PT Short Term Goals - 01/11/19 1035      PT SHORT TERM GOAL #1   Title  independent with initial HEP    Status  On-going    Target Date  02/01/19      PT SHORT TERM GOAL #2   Title  improve AROM flexion, abduction and ER by at  least 10 degrees for improved function     Status  Achieved    Target Date  02/01/19      PT SHORT TERM GOAL #3   Title  report pain < 3/10 with ADLs for improved function    Status  On-going    Target Date  02/01/19        PT Long Term Goals - 01/04/19 1038      PT LONG TERM GOAL #1   Title  independent with advanced HEP    Status  New    Target Date  03/01/19      PT LONG TERM GOAL #2   Title  FOTO score improved to </= 29% limited for improved function    Status  New    Target Date  03/01/19      PT LONG TERM GOAL #3   Title  Rt shoulder AROM WFLs for improved function    Status  New    Target Date  03/01/19      PT LONG TERM GOAL #4   Title  demonstrate at least 4/5 Rt shoulder strength for improved ADLs     Status  New    Target Date  03/01/19      PT LONG TERM GOAL #5   Title  report pain </= 1/10 with activity for improved function    Status  New    Target Date  03/01/19            Plan - 01/15/19 0926    Clinical Impression Statement  Pt reports mild increase in pain with increased AROM exercises (delayed response so unsure if pain related to PT or increased activity at home).  No increase in pain today with AAROM  exercises.  Progressing well with PT.    Rehab Potential  Excellent    PT Frequency  3x / week   see eval   PT Duration  8 weeks    PT Treatment/Interventions  ADLs/Self Care Home Management;Cryotherapy;Ultrasound;Moist Heat;Iontophoresis 4mg /ml Dexamethasone;Electrical Stimulation;Functional mobility training;Neuromuscular re-education;Therapeutic exercise;Therapeutic activities;Patient/family education;Manual techniques;Passive range of motion;Taping;Vasopneumatic Device    PT Next Visit Plan  review AAROM HEP, isometrics, pulleys, manual/modalities PRN, continue per protocol    PT Home Exercise Plan  Access Code: [email or web address]     Consulted and Agree with Plan of Care  Patient       Patient will benefit from skilled therapeutic intervention in order to improve the following deficits and impairments:  Postural dysfunction, Pain, Decreased range of motion, Decreased strength  Visit Diagnosis: Acute pain of right shoulder  Abnormal posture  Stiffness of right shoulder, not elsewhere classified  Muscle weakness (generalized)  Other symptoms and signs involving the musculoskeletal system     Problem List Patient Active Problem List   Diagnosis Date Noted  . Nontraumatic incomplete tear of right rotator cuff 10/14/2018  . History of adenomatous polyp of colon 03/03/2018  . RBBB 06/09/2012  . Nephrolithiasis 05/22/2011  . Other and unspecified hyperlipidemia 11/20/2009  . DDD (degenerative disc disease), cervical 05/13/2008  . Impaired fasting glucose 10/27/2007       [name], PT, DPT 01/15/19 9:29 AM     [facility] [place], [place], [address] Phone: [phone]   Fax:  [phone]  Name: Joshua [name] [facility] III MRN: [id number] Date of Birth: Sep 11, 1946

## 2019-01-18 ENCOUNTER — Ambulatory Visit (INDEPENDENT_AMBULATORY_CARE_PROVIDER_SITE_OTHER): Payer: Medicare HMO | Admitting: Physical Therapy

## 2019-01-18 ENCOUNTER — Other Ambulatory Visit: Payer: Self-pay

## 2019-01-18 ENCOUNTER — Encounter: Payer: Self-pay | Admitting: Physical Therapy

## 2019-01-18 DIAGNOSIS — R293 Abnormal posture: Secondary | ICD-10-CM

## 2019-01-18 DIAGNOSIS — M6281 Muscle weakness (generalized): Secondary | ICD-10-CM

## 2019-01-18 DIAGNOSIS — M25611 Stiffness of right shoulder, not elsewhere classified: Secondary | ICD-10-CM | POA: Diagnosis not present

## 2019-01-18 DIAGNOSIS — M25511 Pain in right shoulder: Secondary | ICD-10-CM

## 2019-01-18 NOTE — Patient Instructions (Signed)
Head Tilt    Bring Left ear as close as possible to same shoulder. Hold __15__ seconds while counting out loud. Return to center.  Repeat __3__ times. Do _2-3__ sessions per day.  Flexibility: Neck Stretch    Grasp left arm above wrist and pull down across body while gently tilting head same direction (nose towards Left armpit). Hold _15___ seconds. Relax. Repeat __2-3_ times per set. Do __2-3__ sessions per day.  [facility] [place], [place] [address]  [phone] (office) [phone] (fax)

## 2019-01-18 NOTE — Therapy (Signed)
[place], [place], [address] Phone: [phone]   Fax:  [phone]  Physical Therapy Treatment  Patient Details  Name: Joshua [name] MRN: [id number] Date of Birth: [birth year] Referring Provider (PT): Ophelia Charter, MD   Encounter Date: 01/18/2019  PT End of Session - 01/18/19 0931    Visit Number  7    Number of Visits  20    Date for PT Re-Evaluation  03/01/19    Authorization Type  [employer]    PT Start Time  [phone]    PT Stop Time  1022    PT Time Calculation (min)  51 min    Activity Tolerance  Patient tolerated treatment well    Behavior During Therapy  [facility] for tasks assessed/performed       Past Medical History:  Diagnosis Date  . Hyperlipidemia   . Kidney stone     Past Surgical History:  Procedure Laterality Date  . HEMORRHOID SURGERY      There were no vitals filed for this visit.  Subjective Assessment - 01/18/19 0940    Subjective  Pt reported his Rt hand has been going numb when he sitting completing puzzles; he can resolve with repositioning.  His shoulder is not sore during isometrics at home, but afterwards.      Patient Stated Goals  regain mobility, decrease pain    Currently in Pain?  Yes    Pain Score  3     Pain Location  Shoulder    Pain Orientation  Right    Pain Descriptors / Indicators  Aching    Aggravating Factors   reaching     Pain Relieving Factors  rest, ice          [facility] PT Assessment - 01/18/19 0001      Assessment   Medical Diagnosis  Rt RTC repair     Referring Provider (PT)  Ophelia Charter, MD    Onset Date/Surgical Date  11/09/18    Hand Dominance  Right    Next MD Visit  02/04/2019      [facility] Adult PT Treatment/Exercise - 01/18/19 0001      Shoulder Exercises: Seated   Other Seated Exercises  shoulder rolls x 10 reps       Shoulder Exercises: Standing   Internal Rotation  AAROM;Right;10 reps   cane behind back, 2 sets   Flexion   AROM;Right;10 reps   to 90 deg with cues for posture. eccentric lowering   ABduction  Right;AROM;10 reps   to 90 deg, scaption, eccentric lowering   Extension  AAROM;Both;10 reps   cane behind back     Shoulder Exercises: Pulleys   Flexion  --   5 seconds x 10 reps    Scaption  --   5 sec hold, 10 reps     Shoulder Exercises: Stretch   Other Shoulder Stretches  Rt tricep stretch with wall assist x 15 sec x 2 reps    Other Shoulder Stretches  elbow press in supine with hands behind head x 15 sec x 4 reps      Vasopneumatic   Number Minutes Vasopneumatic   10 minutes    Vasopnuematic Location   Shoulder    Vasopneumatic Pressure  Low    Vasopneumatic Temperature   34 deg       Manual Therapy   Manual Therapy  Myofascial release;Soft tissue mobilization;Passive ROM    Soft tissue mobilization  STM  to Rt pec, Rt upper thoracic paraspinals, and upper trap     Myofascial Release  to Rt pec and upper trap.     Passive ROM  Rt shoulder all motions to tolerance with end range holds      Neck Exercises: Stretches   Upper Trapezius Stretch  Right;Left;3 reps;20 seconds    Levator Stretch  Left;3 reps;20 seconds             PT Education - 01/18/19 1145    Education Details  HEP - added neck stretches.     Person(s) Educated  Patient    Methods  Explanation;Handout;Demonstration    Comprehension  Verbalized understanding;Returned demonstration       PT Short Term Goals - 01/11/19 1035      PT SHORT TERM GOAL #1   Title  independent with initial HEP    Status  On-going    Target Date  02/01/19      PT SHORT TERM GOAL #2   Title  improve AROM flexion, abduction and ER by at least 10 degrees for improved function     Status  Achieved    Target Date  02/01/19      PT SHORT TERM GOAL #3   Title  report pain < 3/10 with ADLs for improved function    Status  On-going    Target Date  02/01/19        PT Long Term Goals - 01/04/19 1038      PT LONG TERM GOAL #1    Title  independent with advanced HEP    Status  New    Target Date  03/01/19      PT LONG TERM GOAL #2   Title  FOTO score improved to </= 29% limited for improved function    Status  New    Target Date  03/01/19      PT LONG TERM GOAL #3   Title  Rt shoulder AROM WFLs for improved function    Status  New    Target Date  03/01/19      PT LONG TERM GOAL #4   Title  demonstrate at least 4/5 Rt shoulder strength for improved ADLs     Status  New    Target Date  03/01/19      PT LONG TERM GOAL #5   Title  report pain </= 1/10 with activity for improved function    Status  New    Target Date  03/01/19            Plan - 01/18/19 1126    Clinical Impression Statement  Pt performed all exercises in session (AAROM/ AROM) to tolerance and reported no delayed increase in Rt shoulder pain. Pt progressing well towards goals; working within rehab protocol.      Rehab Potential  Excellent    PT Frequency  3x / week   see eval   PT Duration  8 weeks    PT Treatment/Interventions  ADLs/Self Care Home Management;Cryotherapy;Ultrasound;Moist Heat;Iontophoresis 4mg /ml Dexamethasone;Electrical Stimulation;Functional mobility training;Neuromuscular re-education;Therapeutic exercise;Therapeutic activities;Patient/family education;Manual techniques;Passive range of motion;Taping;Vasopneumatic Device    PT Next Visit Plan  AAROM, isometrics, pulleys, manual/modalities PRN, continue per protocol (no resistance until after 12 wks)    PT Home Exercise Plan  Access Code: [email or web address]     Consulted and Agree with Plan of Care  Patient       Patient will benefit from skilled therapeutic intervention in order to improve the following deficits and  impairments:  Postural dysfunction, Pain, Decreased range of motion, Decreased strength  Visit Diagnosis: Acute pain of right shoulder  Abnormal posture  Stiffness of right shoulder, not elsewhere classified  Muscle weakness (generalized)     Problem  List Patient Active Problem List   Diagnosis Date Noted  . Nontraumatic incomplete tear of right rotator cuff 10/14/2018  . History of adenomatous polyp of colon 03/03/2018  . RBBB 06/09/2012  . Nephrolithiasis 05/22/2011  . Other and unspecified hyperlipidemia 11/20/2009  . DDD (degenerative disc disease), cervical 05/13/2008  . Impaired fasting glucose 10/27/2007   Kerin Perna, PTA 01/18/19 11:52 AM  [facility] [place], [place], [address] Phone: [phone]   Fax:  [phone]  Name: Joshua [name] MRN: [id number] Date of Birth: 02/15/1946

## 2019-01-20 ENCOUNTER — Ambulatory Visit (INDEPENDENT_AMBULATORY_CARE_PROVIDER_SITE_OTHER): Payer: Medicare HMO | Admitting: Physical Therapy

## 2019-01-20 ENCOUNTER — Encounter: Payer: Self-pay | Admitting: Physical Therapy

## 2019-01-20 ENCOUNTER — Other Ambulatory Visit: Payer: Self-pay

## 2019-01-20 DIAGNOSIS — M25511 Pain in right shoulder: Secondary | ICD-10-CM

## 2019-01-20 DIAGNOSIS — M6281 Muscle weakness (generalized): Secondary | ICD-10-CM | POA: Diagnosis not present

## 2019-01-20 DIAGNOSIS — R293 Abnormal posture: Secondary | ICD-10-CM

## 2019-01-20 DIAGNOSIS — M25611 Stiffness of right shoulder, not elsewhere classified: Secondary | ICD-10-CM

## 2019-01-20 NOTE — Therapy (Signed)
[place], [place], [address] Phone: [phone]   Fax:  [phone]  Physical Therapy Treatment  Patient Details  Name: Joshua [name] MRN: [id number] Date of Birth: [birth year] Referring Provider (PT): Ophelia Charter, MD   Encounter Date: 01/20/2019  PT End of Session - 01/20/19 1031    Visit Number  8    Number of Visits  20    Date for PT Re-Evaluation  03/01/19    Authorization Type  [employer]    PT Start Time  0930    PT Stop Time  1020    PT Time Calculation (min)  50 min    Activity Tolerance  Patient tolerated treatment well    Behavior During Therapy  [facility] for tasks assessed/performed       Past Medical History:  Diagnosis Date  . Hyperlipidemia   . Kidney stone     Past Surgical History:  Procedure Laterality Date  . HEMORRHOID SURGERY      There were no vitals filed for this visit.  Subjective Assessment - 01/20/19 1030    Subjective  Pt states he is doing well with HEP. Mild soreness in shoulder, but decreasing.     Currently in Pain?  Yes    Pain Score  2     Pain Location  Shoulder    Pain Orientation  Right    Pain Descriptors / Indicators  Aching    Pain Type  Surgical pain    Pain Onset  More than a month ago    Pain Frequency  Intermittent         [facility] PT Assessment - 01/20/19 0001      PROM   Right Shoulder Flexion  150 Degrees    Right Shoulder ABduction  155 Degrees    Right Shoulder Internal Rotation  65 Degrees    Right Shoulder External Rotation  65 Degrees                   [facility] Adult PT Treatment/Exercise - 01/20/19 0933      Shoulder Exercises: Supine   Protraction  10 reps    Protraction Limitations  SA punch with cane    External Rotation  AROM;20 reps    Flexion  AAROM;20 reps    Flexion Limitations  cane      Shoulder Exercises: Sidelying   External Rotation  15 reps      Shoulder Exercises: Standing   External Rotation   AAROM;Right;10 reps    Internal Rotation  AAROM;Right;10 reps   cane behind back, 2 sets   Flexion  AROM;Right;10 reps   to 90 deg with cues for posture. eccentric lowering   ABduction  Right;AROM;10 reps   to 90 deg, scaption, eccentric lowering   Extension  AAROM;Both;10 reps   cane behind back     Shoulder Exercises: Pulleys   Flexion  --   5 seconds x 10 reps    Scaption  --   5 sec hold, 10 reps     Shoulder Exercises: Stretch   Other Shoulder Stretches  elbow press in supine with hands behind head x 15 sec x 4 reps      Vasopneumatic   Number Minutes Vasopneumatic   10 minutes    Vasopnuematic Location   Shoulder    Vasopneumatic Pressure  Low    Vasopneumatic Temperature   34 deg       Manual Therapy  Manual Therapy  Myofascial release;Soft tissue mobilization;Passive ROM    Passive ROM  Rt shoulder all motions       Neck Exercises: Stretches   Upper Trapezius Stretch  --    Levator Stretch  --               PT Short Term Goals - 01/11/19 1035      PT SHORT TERM GOAL #1   Title  independent with initial HEP    Status  On-going    Target Date  02/01/19      PT SHORT TERM GOAL #2   Title  improve AROM flexion, abduction and ER by at least 10 degrees for improved function     Status  Achieved    Target Date  02/01/19      PT SHORT TERM GOAL #3   Title  report pain < 3/10 with ADLs for improved function    Status  On-going    Target Date  02/01/19        PT Long Term Goals - 01/04/19 1038      PT LONG TERM GOAL #1   Title  independent with advanced HEP    Status  New    Target Date  03/01/19      PT LONG TERM GOAL #2   Title  FOTO score improved to </= 29% limited for improved function    Status  New    Target Date  03/01/19      PT LONG TERM GOAL #3   Title  Rt shoulder AROM WFLs for improved function    Status  New    Target Date  03/01/19      PT LONG TERM GOAL #4   Title  demonstrate at least 4/5 Rt shoulder strength for  improved ADLs     Status  New    Target Date  03/01/19      PT LONG TERM GOAL #5   Title  report pain </= 1/10 with activity for improved function    Status  New    Target Date  03/01/19            Plan - 01/20/19 1033    Clinical Impression Statement  Pt with improving PROM, and improving ability for AAROM. Pt to benefit from continued progression of AROM, and progression to strengthening when able, per protocol will be another 2 weeks. Reviewed HEP. Pt with mild soreness at end ranges of flexion and ER today with ther ex.     Rehab Potential  Excellent    PT Frequency  3x / week   see eval   PT Duration  8 weeks    PT Treatment/Interventions  ADLs/Self Care Home Management;Cryotherapy;Ultrasound;Moist Heat;Iontophoresis 4mg /ml Dexamethasone;Electrical Stimulation;Functional mobility training;Neuromuscular re-education;Therapeutic exercise;Therapeutic activities;Patient/family education;Manual techniques;Passive range of motion;Taping;Vasopneumatic Device    PT Next Visit Plan  AAROM, isometrics, pulleys, manual/modalities PRN, continue per protocol (no resistance until after 12 wks)    PT Home Exercise Plan  Access Code: [email or web address]     Consulted and Agree with Plan of Care  Patient       Patient will benefit from skilled therapeutic intervention in order to improve the following deficits and impairments:  Postural dysfunction, Pain, Decreased range of motion, Decreased strength  Visit Diagnosis: Acute pain of right shoulder  Abnormal posture  Stiffness of right shoulder, not elsewhere classified  Muscle weakness (generalized)     Problem List Patient Active Problem List   Diagnosis Date Noted  .  Nontraumatic incomplete tear of right rotator cuff 10/14/2018  . History of adenomatous polyp of colon 03/03/2018  . RBBB 06/09/2012  . Nephrolithiasis 05/22/2011  . Other and unspecified hyperlipidemia 11/20/2009  . DDD (degenerative disc disease), cervical 05/13/2008  .  Impaired fasting glucose 10/27/2007    Lyndee Hensen, PT, DPT 10:34 AM  01/20/19    [facility] [place] [address] [place], [place], [address] Phone: [phone]   Fax:  [phone]  Name: Joshua [name] MRN: [id number] Date of Birth: 10/27/1946

## 2019-01-22 ENCOUNTER — Encounter: Payer: No Typology Code available for payment source | Admitting: Physical Therapy

## 2019-01-25 ENCOUNTER — Encounter: Payer: No Typology Code available for payment source | Admitting: Physical Therapy

## 2019-01-26 ENCOUNTER — Telehealth: Payer: Self-pay | Admitting: Physical Therapy

## 2019-01-26 NOTE — Telephone Encounter (Signed)
Called patient but there was no answer and non-descript voicemail message.   Requested patient return call to [facility] for more information (regarding  office closure and to reschedule).  Left number for [facility] re[facility].    Kerin Perna, PTA 01/26/19 10:50 AM

## 2019-01-26 NOTE — Telephone Encounter (Signed)
Pt returned call to follow up due to clinic closing.  Pt doing well and currently at this time unable to progress protocol further until after MD appt on 02/04/2019.  Advised to continue with HEP and will check in next week to follow up.  Most recent progress note sent to MD for appt follow up.  Pt interested in e-visits if available  [name], PT, DPT 01/26/19 12:26 PM

## 2019-01-27 ENCOUNTER — Encounter: Payer: No Typology Code available for payment source | Admitting: Physical Therapy

## 2019-01-29 ENCOUNTER — Encounter: Payer: No Typology Code available for payment source | Admitting: Physical Therapy

## 2019-02-09 ENCOUNTER — Encounter: Payer: Self-pay | Admitting: Physical Therapy

## 2019-02-09 ENCOUNTER — Other Ambulatory Visit: Payer: Self-pay

## 2019-02-09 ENCOUNTER — Ambulatory Visit (INDEPENDENT_AMBULATORY_CARE_PROVIDER_SITE_OTHER): Payer: Medicare HMO | Admitting: Physical Therapy

## 2019-02-09 DIAGNOSIS — R293 Abnormal posture: Secondary | ICD-10-CM | POA: Diagnosis not present

## 2019-02-09 DIAGNOSIS — M6281 Muscle weakness (generalized): Secondary | ICD-10-CM | POA: Diagnosis not present

## 2019-02-09 DIAGNOSIS — M25511 Pain in right shoulder: Secondary | ICD-10-CM

## 2019-02-09 DIAGNOSIS — M25611 Stiffness of right shoulder, not elsewhere classified: Secondary | ICD-10-CM

## 2019-02-09 DIAGNOSIS — R29898 Other symptoms and signs involving the musculoskeletal system: Secondary | ICD-10-CM

## 2019-02-09 NOTE — Therapy (Signed)
[place], [place], [address] Phone: [phone]   Fax:  [phone]  Physical Therapy Treatment  Patient Details  Name: Joshua [name] [facility] MRN: [id number] Date of Birth: 12/11/1945 Referring Provider (PT): Ophelia Charter, MD   Encounter Date: 02/09/2019  PT End of Session - 02/09/19 1206    Visit Number  9    Number of Visits  20    Date for PT Re-Evaluation  03/01/19    Authorization Type  [employer]    PT Start Time  1056    PT Stop Time  1140    PT Time Calculation (min)  44 min    Activity Tolerance  Patient tolerated treatment well    Behavior During Therapy  [facility] for tasks assessed/performed       Past Medical History:  Diagnosis Date  . Hyperlipidemia   . Kidney stone     Past Surgical History:  Procedure Laterality Date  . HEMORRHOID SURGERY      There were no vitals filed for this visit.  Subjective Assessment - 02/09/19 1059    Subjective  MD is pleased with progress and has released him to begin strengthening    Patient Stated Goals  regain mobility, decrease pain    Currently in Pain?  No/denies         [facility] PT Assessment - 02/09/19 1105      Assessment   Medical Diagnosis  Rt RTC repair     Referring Provider (PT)  Ophelia Charter, MD    Onset Date/Surgical Date  11/09/18    Hand Dominance  Right    Next MD Visit  PRN      AROM   Right Shoulder Flexion  140 Degrees   seated   Right Shoulder ABduction  165 Degrees   seated   Right Shoulder Internal Rotation  --   [facility]   Right Shoulder External Rotation  55 Degrees   seated                  [facility] Adult PT Treatment/Exercise - 02/09/19 1101      Shoulder Exercises: Standing   External Rotation  Strengthening;Right;20 reps;Theraband    Theraband Level (Shoulder External Rotation)  Level 3 (Green)    Internal Rotation  Both;20 reps;Weights   behind back   Internal Rotation Weight (lbs)  2   each hand    Internal Rotation Limitations  2x10 with red theraband    Flexion  Right;20 reps   2#: 10 reps; 4#: 10 reps   ABduction  Both;20 reps;Weights   2#   Shoulder ABduction Weight (lbs)  2    Row  Both;20 reps;Theraband    Theraband Level (Shoulder Row)  Level 3 (Green)    Row Limitations  5 sec hold      Shoulder Exercises: Pulleys   Flexion  3 minutes    Scaption  3 minutes      Shoulder Exercises: ROM/Strengthening   Wall Pushups  20 reps   from counter height   Rhythmic Stabilization, Supine  5#; A-Z x 2 reps             PT Education - 02/09/19 1206    Education Details  HEP    Person(s) Educated  Patient    Methods  Explanation;Demonstration;Handout    Comprehension  Verbalized understanding;Returned demonstration       PT Short Term Goals - 02/09/19 1239  PT SHORT TERM GOAL #1   Title  independent with initial HEP    Status  Achieved    Target Date  02/01/19      PT SHORT TERM GOAL #2   Title  improve AROM flexion, abduction and ER by at least 10 degrees for improved function     Status  Achieved    Target Date  02/01/19      PT SHORT TERM GOAL #3   Title  report pain < 3/10 with ADLs for improved function    Status  Achieved    Target Date  02/01/19        PT Long Term Goals - 01/04/19 1038      PT LONG TERM GOAL #1   Title  independent with advanced HEP    Status  New    Target Date  03/01/19      PT LONG TERM GOAL #2   Title  FOTO score improved to </= 29% limited for improved function    Status  New    Target Date  03/01/19      PT LONG TERM GOAL #3   Title  Rt shoulder AROM WFLs for improved function    Status  New    Target Date  03/01/19      PT LONG TERM GOAL #4   Title  demonstrate at least 4/5 Rt shoulder strength for improved ADLs     Status  New    Target Date  03/01/19      PT LONG TERM GOAL #5   Title  report pain </= 1/10 with activity for improved function    Status  New    Target Date  03/01/19             Plan - 02/09/19 1240    Clinical Impression Statement  Pt has met all STGs and is now able to progress into strengthening phase of protocol.  Pt provided with extensive HEP as clinic operating on limited hours due to COVID-19; and offered telehealth visit or in person visit.  Pt requested to follow up in 1-2 weeks and would be interested in additional visits if needed.  Pt overall progressing well with PT.    Rehab Potential  Excellent    PT Frequency  3x / week   see eval   PT Duration  8 weeks    PT Treatment/Interventions  ADLs/Self Care Home Management;Cryotherapy;Ultrasound;Moist Heat;Iontophoresis 34m/ml Dexamethasone;Electrical Stimulation;Functional mobility training;Neuromuscular re-education;Therapeutic exercise;Therapeutic activities;Patient/family education;Manual techniques;Passive range of motion;Taping;Vasopneumatic Device    PT Next Visit Plan  continue strengthening, review and update HEP PRN    PT Home Exercise Plan  Access Code: [email or web address]     Consulted and Agree with Plan of Care  Patient       Patient will benefit from skilled therapeutic intervention in order to improve the following deficits and impairments:  Postural dysfunction, Pain, Decreased range of motion, Decreased strength  Visit Diagnosis: Acute pain of right shoulder  Abnormal posture  Stiffness of right shoulder, not elsewhere classified  Muscle weakness (generalized)  Other symptoms and signs involving the musculoskeletal system     Problem List Patient Active Problem List   Diagnosis Date Noted  . Nontraumatic incomplete tear of right rotator cuff 10/14/2018  . History of adenomatous polyp of colon 03/03/2018  . RBBB 06/09/2012  . Nephrolithiasis 05/22/2011  . Other and unspecified hyperlipidemia 11/20/2009  . DDD (degenerative disc disease), cervical 05/13/2008  . Impaired fasting glucose 10/27/2007  [name], PT, DPT 02/09/19 12:43 PM    [facility] [place], [place], [address] Phone: [phone]   Fax:  [phone]  Name: Joshua [name] [facility] MRN: [id number] Date of Birth: 03-29-1946

## 2019-02-09 NOTE — Patient Instructions (Signed)
Access Code: [email or web address]  URL: [email or web address]  Date: 02/09/2019  Prepared by: Faustino Congress   Program Notes     Exercises  Single Arm Shoulder Flexion with Dumbbell - 10 reps - 2 sets - 1x daily - 7x weekly  Standing Single Arm Shoulder Abduction with Dumbbell - Thumb Up - 10 reps - 2 sets - 1x daily - 7x weekly  Standing Bilateral Shoulder Internal Rotation AAROM with Dowel - 10 reps - 2 sets - 1x daily - 7x weekly  Scapular Retraction with Resistance - 10 reps - 2 sets - 5 sec hold - 1x daily - 7x weekly  Shoulder External Rotation with Anchored Resistance - 10 reps - 2 sets - 1x daily - 7x weekly  Shoulder Internal Rotation with Resistance - 10 reps - 2 sets - 1x daily - 7x weekly  Push-Up on Counter - 10 reps - 2 sets - 1x daily - 7x weekly  Supine Shoulder Alphabet - 1-2 reps - 1 sets - 1x daily - 7x weekly

## 2019-03-10 ENCOUNTER — Ambulatory Visit (INDEPENDENT_AMBULATORY_CARE_PROVIDER_SITE_OTHER): Payer: Medicare HMO | Admitting: Physical Therapy

## 2019-03-10 ENCOUNTER — Other Ambulatory Visit: Payer: Self-pay

## 2019-03-10 ENCOUNTER — Encounter: Payer: Self-pay | Admitting: Physical Therapy

## 2019-03-10 DIAGNOSIS — R293 Abnormal posture: Secondary | ICD-10-CM | POA: Diagnosis not present

## 2019-03-10 DIAGNOSIS — M25511 Pain in right shoulder: Secondary | ICD-10-CM | POA: Diagnosis not present

## 2019-03-10 DIAGNOSIS — M25611 Stiffness of right shoulder, not elsewhere classified: Secondary | ICD-10-CM

## 2019-03-10 DIAGNOSIS — M6281 Muscle weakness (generalized): Secondary | ICD-10-CM

## 2019-03-10 NOTE — Patient Instructions (Signed)
Access Code: [email or web address]  URL: [email or web address]  Date: 03/10/2019  Prepared by: Kerin Perna   Program Notes     Exercises  Single Arm Shoulder Flexion with Dumbbell - 10 reps - 2 sets - 1x daily - 7x weekly  Standing Single Arm Shoulder Abduction with Dumbbell - Thumb Up - 10 reps - 2 sets - 1x daily - 7x weekly  Standing Bilateral Shoulder Internal Rotation AAROM with Dowel - 10 reps - 2 sets - 1x daily - 7x weekly  Standing Row with Anchored Resistance - 10 reps - 2 sets - 5 seconds hold - 1x daily - 7x weekly  Shoulder External Rotation with Anchored Resistance - 10 reps - 2 sets - 1x daily - 7x weekly  Shoulder Internal Rotation with Resistance - 10 reps - 2 sets - 1x daily - 7x weekly  Push-Up on Counter - 10 reps - 2 sets - 1x daily - 7x weekly  Standing Wall Ball Circles in Scaption with Mini Swiss Ball - 2 sets - 1 set ABC's - 1x daily - 7x weekly  Standing Wall [employer] with [employer] - 3 sets - 1 set ABCs - 1x daily - 7x weekly  Doorway Pec Stretch at 90 Degrees Abduction - 2 reps - 15-30 seconds hold - 2x daily - 7x weekly  Single Arm Doorway Pec Stretch at 120 Degrees Abduction - 2 reps - 15-30 seconds hold - 1x daily - 7x weekly  Standing Shoulder Extension ROM with Dowel - 10 reps - 1 sets - 1x daily - 7x weekly

## 2019-03-10 NOTE — Addendum Note (Signed)
Addended by: [name] on: 03/10/2019 08:21 PM   Modules accepted: Orders

## 2019-03-10 NOTE — Therapy (Addendum)
[place], [place], [address] Phone: [phone]   Fax:  [phone]  Physical Therapy Treatment/Recertification/Progress Note  Patient Details  Name: Joshua [name] MRN: [id number] Date of Birth: 09-16-1946 Referring Provider (PT): Ophelia Charter, MD   Encounter Date: 03/10/2019   Progress Note Reporting Period 01/04/2019 to 03/10/2019  See note below for Objective Data and Assessment of Progress/Goals.       PT End of Session - 03/10/19 0807    Visit Number  10    Number of Visits  20    Date for PT Re-Evaluation  04/21/19    Authorization Type  [employer]    PT Start Time  0802    PT Stop Time  5465    PT Time Calculation (min)  55 min    Activity Tolerance  Patient tolerated treatment well;No increased pain    Behavior During Therapy  WFL for tasks assessed/performed       Past Medical History:  Diagnosis Date  . Hyperlipidemia   . Kidney stone     Past Surgical History:  Procedure Laterality Date  . HEMORRHOID SURGERY      There were no vitals filed for this visit.  Subjective Assessment - 03/10/19 0807    Subjective  Pt complains of occasional pain (3/10) in Rt shoulder at night, usually the nights following the exercises. He states he completes HEP 5 days a wk.  He was told by MD last visit, no chopping wood or heavy hammering, at least for 1 yr.     Patient Stated Goals  regain mobility, decrease pain    Currently in Pain?  No/denies    Pain Score  0-No pain         [facility] PT Assessment - 03/10/19 0001      Assessment   Medical Diagnosis  Rt RTC repair     Referring Provider (PT)  Ophelia Charter, MD    Onset Date/Surgical Date  11/09/18    Hand Dominance  Right    Next MD Visit  PRN      Observation/Other Assessments   Focus on Therapeutic Outcomes (FOTO)   75 (25% limitation)      Strength   Strength Assessment Site  Shoulder    Right/Left Shoulder  Right    Right  Shoulder Flexion  4+/5    Right Shoulder Extension  5/5    Right Shoulder ABduction  3+/5    Right Shoulder Internal Rotation  4+/5    Right Shoulder External Rotation  4/5    Right Shoulder Horizontal ABduction  4+/5    Right Shoulder Horizontal ADduction  4+/5      [facility] Adult PT Treatment/Exercise - 03/10/19 0001      Shoulder Exercises: Standing   External Rotation  Strengthening;Right;10 reps    Theraband Level (Shoulder External Rotation)  Level 3 (Green)    External Rotation Limitations  2nd exercise with arm at side, elbow 90 deg, side stepping  x 5 reps     Internal Rotation  Strengthening;Right;10 reps    Theraband Level (Shoulder Internal Rotation)  Level 3 (Green)    Internal Rotation Limitations  AAROM with cane behind back, mirror and VC for feedback on posture and form    Flexion  Right;5 reps;10 reps;Strengthening   5 reps with 4#, 10 reps with 3#   Flexion Limitations  tactile cues for posture and arm position   5 reps with 4#, 10 reps  with 3#   ABduction  Right;5 reps;10 reps;Strengthening    Row  Strengthening;Right;10 reps;Theraband    Theraband Level (Shoulder Row)  Level 3 (Green)      Shoulder Exercises: Therapy Ball   Flexion  Right ABC's x 1   ABduction  Right (scaption) ABC's x 1     Shoulder Exercises: ROM/Strengthening   UBE (Upper Arm Bike)  L2: 1 min forward, 1 min backward, x 2     Wall Pushups  --   reviewed verbally     Shoulder Exercises: Stretch   Other Shoulder Stretches  bicep stretch (bilat) with hands on door and straight elbows x 15 sec x 3 reps; mid and high level doorway stretch x 20 sec x 2 reps (high level unilateral, each arm)              PT Education - 03/10/19 1018    Education Details  updated HEP     Person(s) Educated  Patient    Methods  Explanation;Handout;Verbal cues;Demonstration    Comprehension  Verbalized understanding;Verbal cues required       PT Short Term Goals - 02/09/19 1239      PT SHORT TERM GOAL  #1   Title  independent with initial HEP    Status  Achieved    Target Date  02/01/19      PT SHORT TERM GOAL #2   Title  improve AROM flexion, abduction and ER by at least 10 degrees for improved function     Status  Achieved    Target Date  02/01/19      PT SHORT TERM GOAL #3   Title  report pain < 3/10 with ADLs for improved function    Status  Achieved    Target Date  02/01/19        PT Long Term Goals - 03/10/19 1010      PT LONG TERM GOAL #1   Title  independent with advanced HEP    Time  6    Period  Weeks    Status  On-going    Target Date  04/21/19      PT LONG TERM GOAL #2   Title  FOTO score improved to </= 29% limited for improved function    Time  6    Period  Weeks    Status  Achieved      PT LONG TERM GOAL #3   Title  Rt shoulder AROM WFLs for improved function    Time  6    Period  Weeks    Status  On-going      PT LONG TERM GOAL #4   Title  demonstrate at least 4/5 Rt shoulder strength for improved ADLs     Baseline  5/6: will continue with goal to obtain goal in all muscle groups    Time  6    Period  Weeks    Status  Partially Met    Target Date  04/21/19      PT LONG TERM GOAL #5   Title  report pain </= 1/10 with activity for improved function    Time  6    Period  Weeks    Status  On-going    Target Date  04/21/19            Plan - 03/10/19 1010    Clinical Impression Statement  Pt tolerated all exercises well with mild temporary increase in discomfort in Rt shoulder.  He required  minor cues for form and posture throughout.  Strength in Rt shoulder has improved; continued weakness in Rt shoulder abduction.  His FOTO score has improved to 25% limitation.  Pt has partially met his goals and will benefit from continued PT intervention to maximize functional mobility.   Addendum: Overall pt progressing well through strengthening phase of protocol.  Will continue PT to address strength and function to decrease risk of reinjury.  SFM     PT Frequency  1x / week   plan to see biweekly with option for 1x/wk depending on progress   PT Duration  6 weeks    PT Treatment/Interventions  ADLs/Self Care Home Management;Cryotherapy;Ultrasound;Moist Heat;Iontophoresis 77m/ml Dexamethasone;Electrical Stimulation;Functional mobility training;Neuromuscular re-education;Therapeutic exercise;Therapeutic activities;Patient/family education;Manual techniques;Passive range of motion;Taping;Vasopneumatic Device    PT Next Visit Plan  spoke to supervising PT; will request additional visits to progress HEP.     PT Home Exercise Plan  Access Code: [email or web address]     Consulted and Agree with Plan of Care  Patient       Patient will benefit from skilled therapeutic intervention in order to improve the following deficits and impairments:  Postural dysfunction, Pain, Decreased range of motion, Decreased strength  Visit Diagnosis: Acute pain of right shoulder - Plan: PT plan of care cert/re-cert  Abnormal posture - Plan: PT plan of care cert/re-cert  Stiffness of right shoulder, not elsewhere classified - Plan: PT plan of care cert/re-cert  Muscle weakness (generalized) - Plan: PT plan of care cert/re-cert     Problem List Patient Active Problem List   Diagnosis Date Noted  . Nontraumatic incomplete tear of right rotator cuff 10/14/2018  . History of adenomatous polyp of colon 03/03/2018  . RBBB 06/09/2012  . Nephrolithiasis 05/22/2011  . Other and unspecified hyperlipidemia 11/20/2009  . DDD (degenerative disc disease), cervical 05/13/2008  . Impaired fasting glucose 10/27/2007   JKerin Perna PTA 03/10/19 8:20 PM   SLaureen Abrahams PT, DPT 03/10/19 8:20 PM    [place] [address]Phone: [phone]  Fax:  [phone] Name: Joshua [name] MRN: [id number] of Birth: 1July 22, 1947

## 2019-03-24 ENCOUNTER — Ambulatory Visit (INDEPENDENT_AMBULATORY_CARE_PROVIDER_SITE_OTHER): Payer: Medicare HMO | Admitting: Physical Therapy

## 2019-03-24 ENCOUNTER — Encounter: Payer: Self-pay | Admitting: Physical Therapy

## 2019-03-24 ENCOUNTER — Other Ambulatory Visit: Payer: Self-pay

## 2019-03-24 DIAGNOSIS — M6281 Muscle weakness (generalized): Secondary | ICD-10-CM

## 2019-03-24 DIAGNOSIS — M25611 Stiffness of right shoulder, not elsewhere classified: Secondary | ICD-10-CM

## 2019-03-24 DIAGNOSIS — M25511 Pain in right shoulder: Secondary | ICD-10-CM

## 2019-03-24 DIAGNOSIS — R293 Abnormal posture: Secondary | ICD-10-CM

## 2019-03-24 NOTE — Patient Instructions (Signed)
[place]: Single Arm    Arms near sides, palms up.  slide arm out to side and up alongside head. . At maximal position, lengthen arm. Hold __10_ seconds. Relax. Repeat __5-10_ times. Slide arm back to start. Repeat with other arm. You should feel this in your pec.   External Rotation (Eccentric), Active-Assist - Supine (Cane)    Lie on back, affected arm out from side, elbow at 90, forearm forward. Use cane to assist in lifting forearm of affected arm to neutral. Hold 10 seconds. Repeat 5 x (Passive).     Triceps Stretch (Wall / Doorframe)    Press upper arm against wall or doorframe. Keep abdominals engaged and step back to deepen stretch. Do not compensate with lower back. Hold for __a few__ breaths. Repeat __2__ times each arm.  Posterior Capsule Sleeper Stretch, Side-Lying    Lie on side, pillow under head, neck in neutral, underside arm in 90-90 of shoulder and elbow flexion with scapula fixed to table. Use other hand to press back of underside arm forward and downward. Keep elbow angle. Hold __10-15_ seconds.  Repeat _3-5__ times per session.   Closed Chain: Shoulder Extensor Stretch - Counter Level    Hands forward on counter, step backward. Stepping backward causes shoulder flexion and stretches shoulder extensors. Hold _10-15__ seconds. Step backward __3_ times, each foot, _2__ times per day.

## 2019-03-24 NOTE — Therapy (Signed)
[place], [place], [address] Phone: [phone]   Fax:  [phone]  Physical Therapy Treatment  Patient Details  Name: Joshua [name] [facility] MRN: [id number] Date of Birth: [birth year] Referring Provider (PT): Ophelia Charter, MD   Encounter Date: 03/24/2019  PT End of Session - 03/24/19 0912    Visit Number  11    Number of Visits  20    Date for PT Re-Evaluation  04/21/19    Authorization Type  [employer]    PT Start Time  0902    PT Stop Time  0950    PT Time Calculation (min)  48 min       Past Medical History:  Diagnosis Date  . Hyperlipidemia   . Kidney stone     Past Surgical History:  Procedure Laterality Date  . HEMORRHOID SURGERY      There were no vitals filed for this visit.  Subjective Assessment - 03/24/19 0913    Subjective  Pt reports his biggest complaint is that he is unable to sleep on his Rt arm, and when he does it wakes him with pain up to 4/10.  He has completed his HEP almost daily.      Patient Stated Goals  regain mobility, decrease pain    Currently in Pain?  No/denies    Pain Score  0-No pain    Pain Location  Shoulder    Pain Orientation  Right         [facility] PT Assessment - 03/24/19 0001      Assessment   Medical Diagnosis  Rt RTC repair     Referring Provider (PT)  Ophelia Charter, MD    Onset Date/Surgical Date  11/09/18    Hand Dominance  Right    Next MD Visit  PRN      AROM   Right Shoulder Flexion  150 Degrees    Right Shoulder External Rotation  72 Degrees      [facility] Adult PT Treatment/Exercise - 03/24/19 0001      Self-Care   Self-Care  Other Self-Care Comments    Other Self-Care Comments   reviewed self massage with ball to Rt shoulder musculature; pt verbalized understanding      Shoulder Exercises: Supine   External Rotation  AAROM;Right;10 reps   cane    Flexion  AAROM;Both;5 reps   10 sec hold; within tolerance, cane   ABduction   AAROM;Both;5 reps   cane   ABduction Limitations  difficulty tolerating this exercise in supine      Shoulder Exercises: ROM/Strengthening   UBE (Upper Arm Bike)  L2: 1 min forward, 1 min backward, x 2       Shoulder Exercises: Stretch       Other Shoulder Stretches  doorway stretch - low, middle, and high position (difficulty tolerating) x 20 sec x 2 reps each.  bilat shoulder ext stretch with hands on door frame and elbows straight x 20 sec x 3 reps. lat stretch with hands on railing x 15 sec x 3 reps; Rt tricep stretch     Other Shoulder Stretches  sleeper stretch into IR on RUE x 5-10 sec x 5 reps;  supine snow angel with RUE, range to tolerance, held for 10 sec x 5 reps; sustained horz abdct with RUE off of table to 90 deg abdct x 1 min, 30 sec 2nd rep      Modalities   Modalities  --  pt declined; will use TENS at home.               PT Short Term Goals - 02/09/19 1239      PT SHORT TERM GOAL #1   Title  independent with initial HEP    Status  Achieved    Target Date  02/01/19      PT SHORT TERM GOAL #2   Title  improve AROM flexion, abduction and ER by at least 10 degrees for improved function     Status  Achieved    Target Date  02/01/19      PT SHORT TERM GOAL #3   Title  report pain < 3/10 with ADLs for improved function    Status  Achieved    Target Date  02/01/19        PT Long Term Goals - 03/10/19 1010      PT LONG TERM GOAL #1   Title  independent with advanced HEP    Time  6    Period  Weeks    Status  On-going    Target Date  04/21/19      PT LONG TERM GOAL #2   Title  FOTO score improved to </= 29% limited for improved function    Time  6    Period  Weeks    Status  Achieved      PT LONG TERM GOAL #3   Title  Rt shoulder AROM WFLs for improved function    Time  6    Period  Weeks    Status  On-going      PT LONG TERM GOAL #4   Title  demonstrate at least 4/5 Rt shoulder strength for improved ADLs     Baseline  5/6: will continue  with goal to obtain goal in all muscle groups    Time  6    Period  Weeks    Status  Partially Met    Target Date  04/21/19      PT LONG TERM GOAL #5   Title  report pain </= 1/10 with activity for improved function    Time  6    Period  Weeks    Status  On-going    Target Date  04/21/19            Plan - 03/24/19 0954    Clinical Impression Statement  Pt demonstrated improved Rt shoulder AROM, however it is still limited. Modified stretches to address tight musculature.  Pt tolerated most stretches well, with the exception of high doorway stretch and supine scaption with cane. Pt is progressing towards goals.     Rehab Potential  Excellent    PT Frequency  1x / week    PT Duration  6 weeks    PT Treatment/Interventions  ADLs/Self Care Home Management;Cryotherapy;Ultrasound;Moist Heat;Iontophoresis 66m/ml Dexamethasone;Electrical Stimulation;Functional mobility training;Neuromuscular re-education;Therapeutic exercise;Therapeutic activities;Patient/family education;Manual techniques;Passive range of motion;Taping;Vasopneumatic Device    PT Next Visit Plan  continue progressive Rt shoulder ROM and strengthening.     PT Home Exercise Plan  Access Code: [email or web address].     Consulted and Agree with Plan of Care  Patient       Patient will benefit from skilled therapeutic intervention in order to improve the following deficits and impairments:  Postural dysfunction, Pain, Decreased range of motion, Decreased strength  Visit Diagnosis: Acute pain of right shoulder  Stiffness of right shoulder, not elsewhere classified  Abnormal posture  Muscle  weakness (generalized)     Problem List Patient Active Problem List   Diagnosis Date Noted  . Nontraumatic incomplete tear of right rotator cuff 10/14/2018  . History of adenomatous polyp of colon 03/03/2018  . RBBB 06/09/2012  . Nephrolithiasis 05/22/2011  . Other and unspecified hyperlipidemia 11/20/2009  . DDD  (degenerative disc disease), cervical 05/13/2008  . Impaired fasting glucose 10/27/2007   Kerin Perna, PTA 03/24/19 12:14 PM  [place], [place], [address] Phone: [phone]   Fax:  [phone]  Name: Jerrit Horen [facility] MRN: [id number] Date of Birth: 06-15-1946

## 2019-03-31 ENCOUNTER — Encounter: Payer: Self-pay | Admitting: Physical Therapy

## 2019-03-31 ENCOUNTER — Ambulatory Visit (INDEPENDENT_AMBULATORY_CARE_PROVIDER_SITE_OTHER): Payer: Medicare HMO | Admitting: Physical Therapy

## 2019-03-31 ENCOUNTER — Other Ambulatory Visit: Payer: Self-pay

## 2019-03-31 DIAGNOSIS — M25511 Pain in right shoulder: Secondary | ICD-10-CM | POA: Diagnosis not present

## 2019-03-31 DIAGNOSIS — M25611 Stiffness of right shoulder, not elsewhere classified: Secondary | ICD-10-CM | POA: Diagnosis not present

## 2019-03-31 NOTE — Therapy (Signed)
[place], [place], [address] Phone: [phone]   Fax:  [phone]  Physical Therapy Treatment  Patient Details  Name: Joshua [name] [facility] MRN: [id number] Date of Birth: 05/09/1946 Referring Provider (PT): Ophelia Charter, MD   Encounter Date: 03/31/2019  PT End of Session - 03/31/19 1420    Visit Number  12    Number of Visits  20    Date for PT Re-Evaluation  04/21/19    Authorization Type  [employer]    PT Start Time  1330    PT Stop Time  1420    PT Time Calculation (min)  50 min    Activity Tolerance  Patient tolerated treatment well;No increased pain    Behavior During Therapy  WFL for tasks assessed/performed       Past Medical History:  Diagnosis Date  . Hyperlipidemia   . Kidney stone     Past Surgical History:  Procedure Laterality Date  . HEMORRHOID SURGERY      There were no vitals filed for this visit.  Subjective Assessment - 03/31/19 1338    Subjective  Shadeed reports he has stopped doing as much strengthening exercises and has focused on stretches.  He noticed he was able to sleep through the night without waking with pain in Rt shoulder for first time 2 nights ago.     Patient Stated Goals  regain mobility, decrease pain    Currently in Pain?  No/denies    Pain Score  0-No pain         [facility] PT Assessment - 03/31/19 0001      Assessment   Medical Diagnosis  Rt RTC repair     Referring Provider (PT)  Ophelia Charter, MD    Onset Date/Surgical Date  11/09/18    Hand Dominance  Right    Next MD Visit  PRN      AROM   Right Shoulder External Rotation  80 Degrees   65 deg scaption, elbow supported on towel. supine AAROM     Strength   Right/Left Shoulder  Right    Right Shoulder Flexion  4+/5   with discomfort   Right Shoulder Extension  5/5    Right Shoulder ABduction  4/5   with discomfort   Right Shoulder Internal Rotation  --   5-/5   Right Shoulder External  Rotation  4+/5       [facility] Adult PT Treatment/Exercise - 03/31/19 0001      Shoulder Exercises: Supine   External Rotation  AAROM;Right;15 reps   cane, 10 sec holds, cues for form    External Rotation Limitations  initially with towel under arm, then without. improved ROM noted during exercise (initially 55 deg, then improved to 80 deg)      Shoulder Exercises: Standing   External Rotation  Both;10 reps    Theraband Level (Shoulder External Rotation)  Level 3 (Green)    External Rotation Limitations  2nd exercise with arm at side, elbow 90 deg, side stepping  x 5 reps       Shoulder Exercises: ROM/Strengthening   UBE (Upper Arm Bike)  L3: 1.5 min forward, 1.5 min backward    Wall Pushups  10 reps   hands on back of truckbed.      Shoulder Exercises: Stretch   Internal Rotation Stretch  4 reps   10-20 seconds, strap assist.    Other Shoulder Stretches  midlevel bilateral doorway stretch x  15 sec x 2 reps.  bilat shoulder ext stretch with hands on door frame x 15 sec x 3 reps. Rt tricep stretch x 15 sec.  bilat shoulder flex with hands at top of door frame, stepping into door x 10 seconds.      Other Shoulder Stretches  sleeper stretch into IR on RUE x 5-10 sec x 5 reps;  supine snow angel with RUE, range to tolerance, held for 20 sec x 3 reps, then 5 slow snow angels (range to tolerance)       Manual Therapy   Soft tissue mobilization  IASTM to Rt pec, deltoid, and teres min/major, infraspinatus - to decrease fascial restrictions and improve ROM. (pt sitting during treatment)               PT Short Term Goals - 02/09/19 1239      PT SHORT TERM GOAL #1   Title  independent with initial HEP    Status  Achieved    Target Date  02/01/19      PT SHORT TERM GOAL #2   Title  improve AROM flexion, abduction and ER by at least 10 degrees for improved function     Status  Achieved    Target Date  02/01/19      PT SHORT TERM GOAL #3   Title  report pain < 3/10 with ADLs for  improved function    Status  Achieved    Target Date  02/01/19        PT Long Term Goals - 03/10/19 1010      PT LONG TERM GOAL #1   Title  independent with advanced HEP    Time  6    Period  Weeks    Status  On-going    Target Date  04/21/19      PT LONG TERM GOAL #2   Title  FOTO score improved to </= 29% limited for improved function    Time  6    Period  Weeks    Status  Achieved      PT LONG TERM GOAL #3   Title  Rt shoulder AROM WFLs for improved function    Time  6    Period  Weeks    Status  On-going      PT LONG TERM GOAL #4   Title  demonstrate at least 4/5 Rt shoulder strength for improved ADLs     Baseline  5/6: will continue with goal to obtain goal in all muscle groups    Time  6    Period  Weeks    Status  Partially Met    Target Date  04/21/19      PT LONG TERM GOAL #5   Title  report pain </= 1/10 with activity for improved function    Time  6    Period  Weeks    Status  On-going    Target Date  04/21/19            Plan - 03/31/19 1647    Clinical Impression Statement  Pt's strength and ROM in Rt shoulder are gradually improving.  Pt reported decrease in occasions of waking in night due to pain in Rt shoulder.  Pt tolerated most exercises well, without increase in Rt shoulder symptoms.  Pt symptom free at end of session.  Progressing well towards remaining goals.     PT Treatment/Interventions  ADLs/Self Care Home Management;Cryotherapy;Ultrasound;Moist Heat;Iontophoresis 57m/ml Dexamethasone;Electrical Stimulation;Functional mobility training;Neuromuscular  re-education;Therapeutic exercise;Therapeutic activities;Patient/family education;Manual techniques;Passive range of motion;Taping;Vasopneumatic Device    PT Next Visit Plan  continue progressive Rt shoulder ROM and strengthening.     PT Home Exercise Plan  [email or web address]    Consulted and Agree with Plan of Care  Patient       Patient will benefit from skilled therapeutic intervention in order  to improve the following deficits and impairments:     Visit Diagnosis: Acute pain of right shoulder  Stiffness of right shoulder, not elsewhere classified     Problem List Patient Active Problem List   Diagnosis Date Noted  . Nontraumatic incomplete tear of right rotator cuff 10/14/2018  . History of adenomatous polyp of colon 03/03/2018  . RBBB 06/09/2012  . Nephrolithiasis 05/22/2011  . Other and unspecified hyperlipidemia 11/20/2009  . DDD (degenerative disc disease), cervical 05/13/2008  . Impaired fasting glucose 10/27/2007   Kerin Perna, PTA 03/31/19 4:57 PM  [place], [place], [address] Phone: [phone]   Fax:  [phone]  Name: Kenderick Kobler [facility] III MRN: [id number] Date of Birth: 06-Jul-1946

## 2019-04-07 ENCOUNTER — Ambulatory Visit (INDEPENDENT_AMBULATORY_CARE_PROVIDER_SITE_OTHER): Payer: Medicare HMO | Admitting: Physical Therapy

## 2019-04-07 ENCOUNTER — Other Ambulatory Visit: Payer: Self-pay

## 2019-04-07 DIAGNOSIS — M25511 Pain in right shoulder: Secondary | ICD-10-CM | POA: Diagnosis not present

## 2019-04-07 NOTE — Therapy (Signed)
[place], [place], [address] Phone: [phone]   Fax:  [phone]  Physical Therapy Treatment  Patient Details  Name: Joshua [name] [facility] MRN: [id number] Date of Birth: [birth year] Referring Provider (PT): Ophelia Charter, MD   Encounter Date: 04/07/2019  PT End of Session - 04/07/19 0908    Visit Number  13    Number of Visits  20    Date for PT Re-Evaluation  04/21/19    Authorization Type  [employer]    PT Start Time  0903    PT Stop Time  0953    PT Time Calculation (min)  50 min       Past Medical History:  Diagnosis Date  . Hyperlipidemia   . Kidney stone     Past Surgical History:  Procedure Laterality Date  . HEMORRHOID SURGERY      There were no vitals filed for this visit.  Subjective Assessment - 04/07/19 0908    Subjective  Bhavin reports he has been doing more stretches throughout day and this has helped loosen his shoulder up.  He has slept fully though night 2x while laying on Rt shoulder, without any pain.     Patient Stated Goals  regain mobility, decrease pain    Currently in Pain?  No/denies    Pain Score  0-No pain         [facility] PT Assessment - 04/07/19 0001      Assessment   Medical Diagnosis  Rt RTC repair     Referring Provider (PT)  Ophelia Charter, MD    Onset Date/Surgical Date  11/09/18    Hand Dominance  Right    Next MD Visit  PRN      AROM   Right Shoulder Flexion  150 Degrees    Right Shoulder ABduction  150 Degrees    Right Shoulder Internal Rotation  --   thumb to ~T9   Right Shoulder External Rotation  80 Degrees   65 deg scaption, elbow supported on towel. supine AAROM     Strength   Right/Left Shoulder  Right    Right Shoulder Flexion  --   5-/5   Right Shoulder Extension  5/5    Right Shoulder ABduction  4/5   with discomfort   Right Shoulder Internal Rotation  5/5    Right Shoulder External Rotation  4+/5       [facility] Adult PT  Treatment/Exercise - 04/07/19 0001      Shoulder Exercises: Supine   External Rotation  AAROM;Right;15 reps   cane, 10 sec holds, cues for form    Flexion  AAROM;Both;5 reps   1 rep with cane     Shoulder Exercises: Standing   External Rotation  --   verbally reviewed   External Rotation Limitations  Rt arm at side, elbow 90 deg, side stepping  x 10 reps with green band      Flexion  --   verbally reviewed, with 1 performed.    ABduction  --   verbally reviewed parameters and form, with 1 performed.    Row  Strengthening;10 reps;Theraband    Theraband Level (Shoulder Row)  Level 3 (Green)   3 sec hold in retraction   Other Standing Exercises  verbally reviewed lat stretch with 1 short rep performed to ck form.       Shoulder Exercises: ROM/Strengthening   UBE (Upper Arm Bike)  L3: 1.5 min forward, 1.5 min  backward    Wall Pushups  --   verbally reviewed      Shoulder Exercises: Stretch   Internal Rotation Stretch  4 reps   10-20 seconds, strap assist.    Other Shoulder Stretches  doorway stretch in low, mid, high, overhead position with bilat shoulders, x 15 sec each, low position repeated.  improved tolerance    Other Shoulder Stretches  sleeper stretch into IR on RUE x 5-10 sec x 5 reps;  supine snow angel with RUE, range to tolerance, held for 20 sec x 3 reps, then 5 slow snow angels (range to tolerance)       Manual Therapy   Soft tissue mobilization  IASTM to Rt pec, deltoid, and teres min/major, infraspinatus - to decrease fascial restrictions and improve ROM. (pt sitting during treatment)        PT Short Term Goals - 02/09/19 1239      PT SHORT TERM GOAL #1   Title  independent with initial HEP    Status  Achieved    Target Date  02/01/19      PT SHORT TERM GOAL #2   Title  improve AROM flexion, abduction and ER by at least 10 degrees for improved function     Status  Achieved    Target Date  02/01/19      PT SHORT TERM GOAL #3   Title  report pain < 3/10  with ADLs for improved function    Status  Achieved    Target Date  02/01/19        PT Long Term Goals - 04/07/19 1043      PT LONG TERM GOAL #1   Title  independent with advanced HEP    Time  6    Period  Weeks    Status  On-going      PT LONG TERM GOAL #2   Title  FOTO score improved to </= 29% limited for improved function    Time  6    Period  Weeks    Status  Achieved      PT LONG TERM GOAL #3   Title  Rt shoulder AROM WFLs for improved function    Time  6    Period  Weeks    Status  Partially Met      PT LONG TERM GOAL #4   Title  demonstrate at least 4/5 Rt shoulder strength for improved ADLs     Time  6    Status  Achieved      PT LONG TERM GOAL #5   Title  report pain </= 1/10 with activity for improved function    Time  6    Period  Weeks    Status  Partially Met            Plan - 04/07/19 1033    Clinical Impression Statement  Good improvement in reported sleep and functional mobility since adding stretches back into HEP.  Pt's ROM and strength in Rt shoulder gradually improving.  Pt is near meeting remaining goals.     Rehab Potential  Excellent    PT Frequency  1x / week    PT Duration  6 weeks    PT Treatment/Interventions  ADLs/Self Care Home Management;Cryotherapy;Ultrasound;Moist Heat;Iontophoresis 48m/ml Dexamethasone;Electrical Stimulation;Functional mobility training;Neuromuscular re-education;Therapeutic exercise;Therapeutic activities;Patient/family education;Manual techniques;Passive range of motion;Taping;Vasopneumatic Device    PT Next Visit Plan  assess goals and readiness to d/c. FOTO.     PT Home Exercise  Plan  [id number]       Patient will benefit from skilled therapeutic intervention in order to improve the following deficits and impairments:  Postural dysfunction, Pain, Decreased range of motion, Decreased strength  Visit Diagnosis: Acute pain of right shoulder     Problem List Patient Active Problem List   Diagnosis Date  Noted  . Nontraumatic incomplete tear of right rotator cuff 10/14/2018  . History of adenomatous polyp of colon 03/03/2018  . RBBB 06/09/2012  . Nephrolithiasis 05/22/2011  . Other and unspecified hyperlipidemia 11/20/2009  . DDD (degenerative disc disease), cervical 05/13/2008  . Impaired fasting glucose 10/27/2007   Kerin Perna, PTA 04/07/19 10:44 AM  [facility] [place], [place], [address] Phone: [phone]   Fax:  [phone]  Name: Lem Peary [facility] MRN: [id number] Date of Birth: October 25, 1946

## 2019-04-07 NOTE — Patient Instructions (Signed)
Resisted External Rotation: in Neutral - Bilateral  PALMS UP!!! Sit or stand, tubing in both hands, elbows at sides, bent to 90, forearms forward. Pinch shoulder blades together and rotate forearms out. Keep elbows at sides. Repeat __10__ times per set. Do __2-3__ sets per session. Do __3-4__ sessions per week.  Resistive Band Rowing   With resistive band anchored in door, grasp both ends. Keeping elbows bent, pull back, squeezing shoulder blades together. Hold the shoulder blade squeeze_3-5___ seconds. Repeat _10-30___ times. Do _3x / wk.

## 2019-04-21 ENCOUNTER — Other Ambulatory Visit: Payer: Self-pay

## 2019-04-21 ENCOUNTER — Encounter: Payer: Self-pay | Admitting: Physical Therapy

## 2019-04-21 ENCOUNTER — Ambulatory Visit (INDEPENDENT_AMBULATORY_CARE_PROVIDER_SITE_OTHER): Payer: Medicare HMO | Admitting: Physical Therapy

## 2019-04-21 DIAGNOSIS — M25511 Pain in right shoulder: Secondary | ICD-10-CM

## 2019-04-21 DIAGNOSIS — M25611 Stiffness of right shoulder, not elsewhere classified: Secondary | ICD-10-CM

## 2019-04-21 DIAGNOSIS — R293 Abnormal posture: Secondary | ICD-10-CM

## 2019-04-21 DIAGNOSIS — M6281 Muscle weakness (generalized): Secondary | ICD-10-CM | POA: Diagnosis not present

## 2019-04-21 NOTE — Patient Instructions (Signed)
Access Code: [email or web address]  URL: [email or web address]  Date: 04/21/2019  Prepared by: Kerin Perna   Exercises  Wall Push Up - 10 reps - 1-2 sets - 5 hold - 1x daily - 3x weekly  Standing Single Arm Elbow Flexion with Resistance - 10 reps - 1-2 sets - 5 hold - 1x daily - 3x weekly

## 2019-04-21 NOTE — Therapy (Addendum)
[place], [place], [address] Phone: [phone]   Fax:  [phone]  Physical Therapy Treatment/Discharge  Patient Details  Name: Joshua [name] [facility] MRN: [id number] Date of Birth: [birth year] Referring Provider (PT): Ophelia Charter, MD   Encounter Date: 04/21/2019  PT End of Session - 04/21/19 0903    Visit Number  14    Number of Visits  20    Date for PT Re-Evaluation  04/21/19    Authorization Type  [employer]    PT Start Time  0903    PT Stop Time  0945    PT Time Calculation (min)  42 min    Activity Tolerance  Patient tolerated treatment well;No increased pain    Behavior During Therapy  WFL for tasks assessed/performed       Past Medical History:  Diagnosis Date  . Hyperlipidemia   . Kidney stone     Past Surgical History:  Procedure Laterality Date  . HEMORRHOID SURGERY      There were no vitals filed for this visit.  Subjective Assessment - 04/21/19 0904    Subjective  Pt reports he is doing very well.  He has been able to sleep through night, even on his right shoulder.  The only stretch he continues to have difficulty with.    Patient Stated Goals  regain mobility, decrease pain    Currently in Pain?  No/denies    Pain Score  0-No pain         [facility] PT Assessment - 04/21/19 0001      Assessment   Medical Diagnosis  Rt RTC repair     Referring Provider (PT)  Ophelia Charter, MD    Onset Date/Surgical Date  11/09/18    Hand Dominance  Right    Next MD Visit  PRN      AROM   Right Shoulder Flexion  155 Degrees    Right Shoulder ABduction  155 Degrees    Right Shoulder Internal Rotation  --   thumb to ~T9   Right Shoulder External Rotation  84 Degrees   standing, shoulder abd 90 deg           [facility] Adult PT Treatment/Exercise - 04/21/19 0001      Elbow Exercises   Elbow Flexion  Strengthening;Right;Left;10 reps   blue band, 5 sec hold in flexion; seated     Shoulder  Exercises: ROM/Strengthening   UBE (Upper Arm Bike)  L3: 1.5 min forward, 1.5 min backward    Wall Pushups  10 reps   with isometric hold at various angles     Shoulder Exercises: Stretch   Internal Rotation Stretch  4 reps   10-20 seconds, strap assist.    Other Shoulder Stretches  doorway stretch in low, mid, high, overhead position with bilat shoulders, x 15 sec each, low position repeated    Other Shoulder Stretches  tricep door stretch x 15 sec x 2 reps each; bicep stretch x 4 reps of 15-30 sec; lat stretch x 2 reps of 20 seconds      Manual Therapy   Soft tissue mobilization  IASTM to Rt pec, deltoid, and teres min/major, infraspinatus - to decrease fascial restrictions and improve ROM. (pt sitting during treatment)             PT Education - 04/21/19 0951    Education Details  HEP, issued blue band for elbow flexion    Person(s) Educated  Patient  Methods  Explanation;Demonstration;Verbal cues;Handout    Comprehension  Verbalized understanding;Returned demonstration       PT Short Term Goals - 02/09/19 1239      PT SHORT TERM GOAL #1   Title  independent with initial HEP    Status  Achieved    Target Date  02/01/19      PT SHORT TERM GOAL #2   Title  improve AROM flexion, abduction and ER by at least 10 degrees for improved function     Status  Achieved    Target Date  02/01/19      PT SHORT TERM GOAL #3   Title  report pain < 3/10 with ADLs for improved function    Status  Achieved    Target Date  02/01/19        PT Long Term Goals - 04/21/19 0933      PT LONG TERM GOAL #1   Title  independent with advanced HEP    Time  6    Period  Weeks    Status  On-going      PT LONG TERM GOAL #2   Title  FOTO score improved to </= 29% limited for improved function    Time  6    Period  Weeks    Status  Achieved      PT LONG TERM GOAL #3   Title  Rt shoulder AROM WFLs for improved function    Time  6    Period  Weeks    Status  Achieved      PT LONG  TERM GOAL #4   Title  demonstrate at least 4/5 Rt shoulder strength for improved ADLs     Time  6    Status  Achieved      PT LONG TERM GOAL #5   Title  report pain </= 1/10 with activity for improved function    Time  6    Period  Weeks    Status  Achieved            Plan - 04/21/19 0933    Clinical Impression Statement  Pt has done very well with exisiting HEP; now pain free.  Rt shoulder ROM improved by 4-5 deg. Pt requests to hold therapy while he works on his HEP. He has met all goals except #1.    Rehab Potential  Excellent    PT Frequency  1x / week    PT Duration  6 weeks    PT Treatment/Interventions  ADLs/Self Care Home Management;Cryotherapy;Ultrasound;Moist Heat;Iontophoresis 39m/ml Dexamethasone;Electrical Stimulation;Functional mobility training;Neuromuscular re-education;Therapeutic exercise;Therapeutic activities;Patient/family education;Manual techniques;Passive range of motion;Taping;Vasopneumatic Device    PT Next Visit Plan  hold therapy per pt request.    PT Home Exercise Plan  [id number]      Patient will benefit from skilled therapeutic intervention in order to improve the following deficits and impairments:  Postural dysfunction, Pain, Decreased range of motion, Decreased strength  Visit Diagnosis: 1. Acute pain of right shoulder   2. Stiffness of right shoulder, not elsewhere classified   3. Abnormal posture   4. Muscle weakness (generalized)        Problem List Patient Active Problem List   Diagnosis Date Noted  . Nontraumatic incomplete tear of right rotator cuff 10/14/2018  . History of adenomatous polyp of colon 03/03/2018  . RBBB 06/09/2012  . Nephrolithiasis 05/22/2011  . Other and unspecified hyperlipidemia 11/20/2009  . DDD (degenerative disc disease), cervical 05/13/2008  . Impaired fasting  glucose 10/27/2007   Kerin Perna, PTA 04/21/19 9:52 AM  [facility] [place], [place], [address] Phone: [phone]   Fax:  [phone]  Name: Miliano Cotten [facility] III MRN: [id number] Date of Birth: 09/17/1946     PHYSICAL THERAPY DISCHARGE SUMMARY  Visits from Start of Care: 14  Current functional level related to goals / functional outcomes: See above   Remaining deficits: n/a   Education / Equipment: HEP  Plan: Patient agrees to discharge.  Patient goals were met. Patient is being discharged due to meeting the stated rehab goals.  ?????     [name], PT, DPT 06/01/19 1:49 PM  [facility] [place], [place] [address]  [phone] (office) [phone] (fax)

## 2019-08-09 ENCOUNTER — Other Ambulatory Visit: Payer: Self-pay

## 2019-08-09 ENCOUNTER — Ambulatory Visit: Payer: Medicare HMO | Admitting: Pulmonary Disease

## 2019-08-09 ENCOUNTER — Encounter: Payer: Self-pay | Admitting: Pulmonary Disease

## 2019-08-09 VITALS — BP 128/80 | HR 62 | Temp 97.6°F | Ht 71.0 in | Wt 248.0 lb

## 2019-08-09 DIAGNOSIS — R0602 Shortness of breath: Secondary | ICD-10-CM

## 2019-08-09 DIAGNOSIS — R0600 Dyspnea, unspecified: Secondary | ICD-10-CM

## 2019-08-09 DIAGNOSIS — Z720 Tobacco use: Secondary | ICD-10-CM

## 2019-08-09 DIAGNOSIS — R0609 Other forms of dyspnea: Secondary | ICD-10-CM

## 2019-08-09 MED ORDER — ANORO ELLIPTA 62.5-25 MCG/INH IN AEPB: 1.0000 | INHALATION_SPRAY | Freq: Every day | RESPIRATORY_TRACT | 0 refills | Status: DC

## 2019-08-09 NOTE — Progress Notes (Signed)
Synopsis: Referred in October 2020, self-referral for shortness of breath by Gerome Sam*  Subjective:   PATIENT ID: Joshua [name] GENDER: male DOB: [birth year], MRN: [id number]  Chief Complaint  Patient presents with   Consult    Consult for SOB, quit smoking 10 years ago. He reports he is SOB all the time. Currently he is using albuterol prn.     73 year old gentleman past medical history of hyperlipidemia, nephrolithiasis patient is a longtime smoker, quit smoking at age 52 yo, smoked 45 years, 2 ppd, 90+ pack year history. c/o SOB and DOE. This has been going on several months. It has slowly becoming worse. He tries to walk at a local park. He has been using his inhaler prior to walking or doing yardwork. He gets winded easily. About 20 years ago he had a situation where he was doing work in the yard and had an episode of hemoptysis. He was taken to hospital in [place]. He was in the hospital for about 10 days. And they never found would why he had hemoptysis. Possibly dx with copd in the past. He had office spirometry and was told he may have copd. No FMHx of lung cancer. Grandfather, heavy smoker and had throat cancer.    Past Medical History:  Diagnosis Date   Hyperlipidemia    Kidney stone      Family History  Problem Relation Age of Onset   Varicose Veins Mother    Stroke Father      Past Surgical History:  Procedure Laterality Date   HEMORRHOID SURGERY      Social History   Socioeconomic History   Marital status: Married    Spouse name: Not on file   Number of children: Not on file   Years of education: Not on file   Highest education level: Not on file  Occupational History   Not on file  Social Needs   Financial resource strain: Not on file   Food insecurity    Worry: Not on file    Inability: Not on file   Transportation needs    Medical: Not on file    Non-medical: Not on file  Tobacco Use   Smoking status: Former  Smoker    Quit date: 2010    Years since quitting: 10.7   Smokeless tobacco: Never Used  Substance and Sexual Activity   Alcohol use: No   Drug use: No   Sexual activity: Not on file  Lifestyle   Physical activity    Days per week: Not on file    Minutes per session: Not on file   Stress: Not on file  Relationships   Social connections    Talks on phone: Not on file    Gets together: Not on file    Attends religious service: Not on file    Active member of club or organization: Not on file    Attends meetings of clubs or organizations: Not on file    Relationship status: Not on file   Intimate partner violence    Fear of current or ex partner: Not on file    Emotionally abused: Not on file    Physically abused: Not on file    Forced sexual activity: Not on file  Other Topics Concern   Not on file  Social History Narrative   Not on file     No Known Allergies   Outpatient Medications Prior to Visit  Medication Sig Dispense Refill   aspirin  81 MG chewable tablet Chew 81 mg by mouth daily.     divalproex (DEPAKOTE) 250 MG DR tablet Take 250 mg by mouth daily.     divalproex (DEPAKOTE) 500 MG DR tablet Take 500 mg by mouth 3 (three) times daily.     doxycycline (VIBRAMYCIN) 100 MG capsule Take 100 mg by mouth as needed. For skin infections     FLUoxetine (PROZAC) 10 MG tablet Take 10 mg by mouth daily.     hydrOXYzine (ATARAX/VISTARIL) 10 MG tablet Take 10 mg by mouth daily as needed.     simvastatin (ZOCOR) 40 MG tablet Take 40 mg by mouth daily.     Tamsulosin HCl (FLOMAX PO) Take by mouth. PRN KIDNEY STONES     diclofenac sodium (VOLTAREN) 1 % GEL Apply 2 g topically 4 (four) times daily. To affected joint. (Patient not taking: Reported on 08/09/2019) 100 g 11   HYDROcodone-acetaminophen (NORCO/VICODIN) 5-325 MG tablet Take 1 tablet by mouth every 4 (four) hours as needed. (Patient not taking: Reported on 01/04/2019) 10 tablet 0   No  facility-administered medications prior to visit.     Review of Systems  Constitutional: Negative for chills, fever, malaise/fatigue and weight loss.  HENT: Negative for hearing loss, sore throat and tinnitus.   Eyes: Negative for blurred vision and double vision.  Respiratory: Positive for sputum production and shortness of breath. Negative for cough, hemoptysis, wheezing and stridor.   Cardiovascular: Negative for chest pain, palpitations, orthopnea, leg swelling and PND.  Gastrointestinal: Negative for abdominal pain, constipation, diarrhea, heartburn, nausea and vomiting.  Genitourinary: Negative for dysuria, hematuria and urgency.  Musculoskeletal: Negative for joint pain and myalgias.  Skin: Negative for itching and rash.  Neurological: Negative for dizziness, tingling, weakness and headaches.  Endo/Heme/Allergies: Negative for environmental allergies. Does not bruise/bleed easily.  Psychiatric/Behavioral: Negative for depression. The patient is not nervous/anxious and does not have insomnia.   All other systems reviewed and are negative.    Objective:  Physical Exam Vitals signs reviewed.  Constitutional:      General: He is not in acute distress.    Appearance: He is well-developed. He is obese.  HENT:     Head: Normocephalic and atraumatic.  Eyes:     General: No scleral icterus.    Conjunctiva/sclera: Conjunctivae normal.     Pupils: Pupils are equal, round, and reactive to light.  Neck:     Musculoskeletal: Neck supple.     Vascular: No JVD.     Trachea: No tracheal deviation.  Cardiovascular:     Rate and Rhythm: Normal rate and regular rhythm.     Heart sounds: Normal heart sounds. No murmur.  Pulmonary:     Effort: Pulmonary effort is normal. No tachypnea, accessory muscle usage or respiratory distress.     Breath sounds: Normal breath sounds. No stridor. No wheezing, rhonchi or rales.  Abdominal:     General: Bowel sounds are normal. There is no distension.       Palpations: Abdomen is soft.     Tenderness: There is no abdominal tenderness.  Musculoskeletal:        General: No tenderness.  Lymphadenopathy:     Cervical: No cervical adenopathy.  Skin:    General: Skin is warm and dry.     Capillary Refill: Capillary refill takes less than 2 seconds.     Findings: No rash.  Neurological:     Mental Status: He is alert and oriented to person, place, and time.  Psychiatric:        Behavior: Behavior normal.      Vitals:   08/09/19 1349  BP: 128/80  Pulse: 62  Temp: 97.6 F (36.4 C)  TempSrc: Temporal  SpO2: 96%  Weight: 248 lb (112.5 kg)  Height: 5\' 11"  (1.803 m)   96% on RA BMI Readings from Last 3 Encounters:  08/09/19 34.59 kg/m  10/14/18 32.92 kg/m  10/05/18 32.36 kg/m   Wt Readings from Last 3 Encounters:  08/09/19 248 lb (112.5 kg)  10/14/18 236 lb (107 kg)  10/05/18 232 lb (105.2 kg)     CBC No results found for: WBC, RBC, HGB, HCT, PLT, MCV, MCH, MCHC, RDW, LYMPHSABS, MONOABS, EOSABS, BASOSABS   Chest Imaging: No previous chest imaging  Pulmonary Functions Testing Results: No flowsheet data found.  FeNO: None  Pathology: None   Echocardiogram: None   Heart Catheterization: None     Assessment & Plan:     ICD-10-CM   1. SOB (shortness of breath)  R06.02 Pulmonary Function Test    Ambulatory Referral for Lung Cancer Scre  2. DOE (dyspnea on exertion)  R06.00 Pulmonary Function Test    Ambulatory Referral for Lung Cancer Scre  3. Tobacco use  Z72.0 Ambulatory Referral for Lung Cancer Scre    Discussion:  73 year old gentleman longstanding history of smoking, 90-pack-year history, 45 years 2 packs/day.  Patient presents with dyspnea on exertion and shortness of breath.  He gets winded with minimal exercise.  Cleaning up in the yard walking around the house feels dyspneic with exertion.  He feels like his albuterol short acting inhaler barely makes any difference.  Plan: Patient could very well  have obstructive lung disease due to his smoking history. We should start his dyspnea evaluation with full pulmonary function test. This can be completed within the next 3 to 4 weeks. Patient to return to clinic after PFTs have been complete. We will also refer to lung cancer screening program due to his significant pack-year history and age. We will also start him on Anoro Ellipta samples. He can continue use of his albuterol for the time being as needed for shortness of breath and wheezing.  Patient to return to clinic after PFTs are complete.  Greater than 50% of this patient's 45-minute of visit was spent face-to-face discussing the above recommendations and treatment plan.   Current Outpatient Medications:    aspirin 81 MG chewable tablet, Chew 81 mg by mouth daily., Disp: , Rfl:    divalproex (DEPAKOTE) 250 MG DR tablet, Take 250 mg by mouth daily., Disp: , Rfl:    divalproex (DEPAKOTE) 500 MG DR tablet, Take 500 mg by mouth 3 (three) times daily., Disp: , Rfl:    doxycycline (VIBRAMYCIN) 100 MG capsule, Take 100 mg by mouth as needed. For skin infections, Disp: , Rfl:    FLUoxetine (PROZAC) 10 MG tablet, Take 10 mg by mouth daily., Disp: , Rfl:    hydrOXYzine (ATARAX/VISTARIL) 10 MG tablet, Take 10 mg by mouth daily as needed., Disp: , Rfl:    simvastatin (ZOCOR) 40 MG tablet, Take 40 mg by mouth daily., Disp: , Rfl:    Tamsulosin HCl (FLOMAX PO), Take by mouth. PRN KIDNEY STONES, Disp: , Rfl:    umeclidinium-vilanterol (ANORO ELLIPTA) 62.5-25 MCG/INH AEPB, Inhale 1 puff into the lungs daily., Disp: 2 each, Rfl: 0   Garner Nash, DO  Pulmonary Critical Care 08/09/2019 2:22 PM

## 2019-08-09 NOTE — Patient Instructions (Addendum)
Thank you for visiting Dr. Valeta Harms at [facility]. Today we recommend the following: Orders Placed This Encounter  Procedures  . Ambulatory Referral for Lung Cancer Scre  . Pulmonary Function Test   Start Anoro samples, use one puff daily  Continue albuterol as needed   Return in about 4 weeks (around 09/06/2019) for with APP or Dr. Valeta Harms. to review PFTs.     Please do your part to reduce the spread of COVID-19.

## 2019-08-19 ENCOUNTER — Other Ambulatory Visit: Payer: Self-pay | Admitting: *Deleted

## 2019-08-19 DIAGNOSIS — Z87891 Personal history of nicotine dependence: Secondary | ICD-10-CM

## 2019-08-19 DIAGNOSIS — Z122 Encounter for screening for malignant neoplasm of respiratory organs: Secondary | ICD-10-CM

## 2019-08-23 NOTE — Telephone Encounter (Signed)
Dr. Valeta Harms please advise on patient email: ----------- Completed 14 days using Anoro each morning as directed Haven't seen any improvement in breathing situation  Where do we go from here? Thanks  [employer]

## 2019-08-23 NOTE — Telephone Encounter (Signed)
Dr. Valeta Harms I see where you want PFTs, next available in our office is not until the 3rd week of December at this time. Do you want [place] to try to schedule patient for the hospital for PFTs?

## 2019-08-24 MED ORDER — BREZTRI AEROSPHERE 160-9-4.8 MCG/ACT IN AERO: 2.0000 | INHALATION_SPRAY | Freq: Two times a day (BID) | RESPIRATORY_TRACT | 0 refills | Status: DC

## 2019-08-24 NOTE — Telephone Encounter (Signed)
Called and spoke to patient. Patient stated he would come pick up the samples that Dr. Valeta Harms wants him to try. Patient shared that he will be in [place] so setting samples for a month up front for pick up.  Patient has PFT and OV with NP scheduled for 09/21/2019.  Nothing further needed at this time.

## 2019-09-20 ENCOUNTER — Other Ambulatory Visit (HOSPITAL_COMMUNITY)
Admission: RE | Admit: 2019-09-20 | Discharge: 2019-09-20 | Disposition: A | Discharge status: Home / Self Care | Payer: Medicare HMO | Source: Ambulatory Visit | Attending: Pulmonary Disease | Admitting: Pulmonary Disease

## 2019-09-20 DIAGNOSIS — Z01812 Encounter for preprocedural laboratory examination: Secondary | ICD-10-CM | POA: Diagnosis present

## 2019-09-20 DIAGNOSIS — Z20828 Contact with and (suspected) exposure to other viral communicable diseases: Secondary | ICD-10-CM | POA: Insufficient documentation

## 2019-09-20 LAB — SARS CORONAVIRUS 2 (TAT 6-24 HRS): SARS Coronavirus 2: NEGATIVE

## 2019-09-20 NOTE — Progress Notes (Signed)
SARS-CoV-2 test is negative.  This is good news.  Proceed forward with work-up as planned.  Brian Mack, FNP 

## 2019-09-21 ENCOUNTER — Ambulatory Visit: Payer: No Typology Code available for payment source | Admitting: Pulmonary Disease

## 2019-09-22 ENCOUNTER — Ambulatory Visit (INDEPENDENT_AMBULATORY_CARE_PROVIDER_SITE_OTHER)
Admission: RE | Admit: 2019-09-22 | Discharge: 2019-09-22 | Disposition: A | Discharge status: Home / Self Care | Payer: Medicare HMO | Source: Ambulatory Visit | Attending: Acute Care | Admitting: Acute Care

## 2019-09-22 ENCOUNTER — Ambulatory Visit (INDEPENDENT_AMBULATORY_CARE_PROVIDER_SITE_OTHER): Payer: Medicare HMO | Admitting: Acute Care

## 2019-09-22 ENCOUNTER — Other Ambulatory Visit: Payer: Self-pay

## 2019-09-22 ENCOUNTER — Telehealth: Payer: Self-pay | Admitting: *Deleted

## 2019-09-22 ENCOUNTER — Encounter: Payer: Self-pay | Admitting: Acute Care

## 2019-09-22 VITALS — BP 124/68 | HR 88 | Temp 97.7°F | Ht 71.0 in | Wt 251.0 lb

## 2019-09-22 DIAGNOSIS — Z87891 Personal history of nicotine dependence: Secondary | ICD-10-CM | POA: Diagnosis not present

## 2019-09-22 DIAGNOSIS — Z122 Encounter for screening for malignant neoplasm of respiratory organs: Secondary | ICD-10-CM

## 2019-09-22 NOTE — Progress Notes (Signed)
@Patient  ID: Joshua [name], male    DOB: July 23, 1946, 73 y.o.   MRN: [id number]  Chief Complaint  Patient presents with   Follow-up    F/U after PFT. States his breathing has gotten worse. Increased SOB, especially with bending over and walking long distances. Denied any increased coughing.     Referring provider: Gerome Sam*  HPI:   73 year old male former smoker initially consulted with our practice on 08/09/2019 for dyspnea.  PMH: hyperlipidemia, nephrolithiasis Smoker/ Smoking History: Former smoker.  Quit 2010.  90-pack-year smoking history Maintenance:  Breztri  Pt of: Dr. Valeta Harms  09/23/2019  - Visit   73 year old male former smoker presenting to our office today to complete a pulmonary function test.  Patient presented to our office yesterday on 09/22/2019 to complete a shared decision-making visit.  Lung cancer screening CT completed on 09/22/2019 shows lung RADS 1 and does show centrilobular and paraseptal emphysema.  Patient completing pulmonary function test today.  Pulmonary function testing results listed below:  09/23/2019-FVC 3.74 (81% predicted), postbronchodilator ratio 80, postbronchodilator FEV1 2.88 (86% predicted), no bronchodilator response, DLCO 19.44 (73% predicted) >>> Flow volume loops may indicate vocal cord dysfunction or upper airway cough syndrome  Patient reporting that he has felt increased shortness of breath lately especially with physical activity.  While on vacation earlier this month he had to use his rescue inhaler multiple times.  Patient will receive a walk today to further evaluate.  Patient was started on breztri he did not feel that this inhaler helped him.  He occasionally has bouts of shortness of breath refill he cannot catch his breath.  Denies ever being diagnosed with A. fib, denies palpitations.  Denies history of OSA.  09/23/2019 - STOP BANG questionnaire  Snoring? Yes  Tiredness? Yes  Observed apneas? No   Elevated blood pressure? No  BMI greater than 35? No  Age greater than 51? Yes  Neck circumference greater than 40 cm? Male gender? Yes   Scoring:  One-point is signed for each.   0-2 equals low risk, 3-4 equals intermediate risk, those with 5 or greater high risk for having obstructive sleep apnea  Score: 4   Patient denies morning headaches.  He denies waking up fatigued.    Questionaires / Pulmonary Flowsheets:   MMRC: mMRC Dyspnea Scale mMRC Score  09/23/2019 3   Tests:   09/22/2019-lung cancer screening-centrilobular and paraseptal emphysema, calcified granulomata noted bilaterally, no suspicious nodules, lung RADS 1, repeat in 12 months  09/23/2019-FVC 3.74 (81% predicted), postbronchodilator ratio 80, postbronchodilator FEV1 2.88 (86% predicted), no bronchodilator response, DLCO 19.44 (73% predicted) >>> Flow volume loops may indicate vocal cord dysfunction or upper airway cough syndrome  09/23/2019-EKG-A. fib, right bundle branch block, rate 95  FENO:  No results found for: NITRICOXIDE  PFT: PFT Results Latest Ref Rng & Units 09/23/2019  FVC-Pre L 3.74  FVC-Predicted Pre % 81  FVC-Post L 3.58  FVC-Predicted Post % 78  Pre FEV1/FVC % % 76  Post FEV1/FCV % % 80  FEV1-Pre L 2.84  FEV1-Predicted Pre % 84  FEV1-Post L 2.88  DLCO UNC% % 73  DLCO COR %Predicted % 83  TLC L 5.96  TLC % Predicted % 82  RV % Predicted % 82    WALK:  SIX MIN WALK 09/23/2019  Supplimental Oxygen during Test? (L/min) No  Tech Comments: Patient was able to complete 2 laps walking at a steady pace. He did have some muscle aches at the end of  the walk but he stated that this was not new. No CP or SOB. No O2 needed during walk.    Imaging: Ct Chest Lung Ca Screen Low Dose W/o Cm  Result Date: 09/22/2019 CLINICAL DATA:  73 year old male with 15 pack-year history of smoking. Lung cancer screening. EXAM: CT CHEST WITHOUT CONTRAST LOW-DOSE FOR LUNG CANCER SCREENING TECHNIQUE:  Multidetector CT imaging of the chest was performed following the standard protocol without IV contrast. COMPARISON:  None. FINDINGS: Cardiovascular: The heart size is normal. No substantial pericardial effusion. Coronary artery calcification is evident. Atherosclerotic calcification is noted in the wall of the thoracic aorta. Mediastinum/Nodes: No mediastinal lymphadenopathy. No evidence for gross hilar lymphadenopathy although assessment is limited by the lack of intravenous contrast on today's study. Calcified nodal tissue seen right hilum. The esophagus has normal imaging features. There is no axillary lymphadenopathy. Lungs/Pleura: Biapical pleuroparenchymal scarring. Centrilobular and paraseptal emphysema evident. Calcified granulomata noted bilaterally. No suspicious pulmonary nodule or mass. No focal airspace consolidation. No pleural effusion. Subpleural reticulation in the lungs bilaterally suggests underlying fibrotic lung disease. Upper Abdomen: Unremarkable. Musculoskeletal: No worrisome lytic or sclerotic osseous abnormality. Mild anterior wedge compression deformity noted at T7. IMPRESSION: 1. Lung-RADS 1, negative. Continue annual screening with low-dose chest CT without contrast in 12 months. 2.  Emphysema. (ICD10-J43.9) 3.  Aortic Atherosclerois (ICD10-170.0) Electronically Signed   By: Misty Stanley M.D.   On: 09/22/2019 11:09    Lab Results:  CBC No results found for: WBC, RBC, HGB, HCT, PLT, MCV, MCH, MCHC, RDW, LYMPHSABS, MONOABS, EOSABS, BASOSABS  BMET No results found for: NA, K, CL, CO2, GLUCOSE, BUN, CREATININE, CALCIUM, GFRNONAA, GFRAA  BNP No results found for: BNP  ProBNP No results found for: PROBNP  Specialty Problems      Pulmonary Problems   Centrilobular emphysema ([place])    09/22/2019-lung cancer screening-centrilobular and paraseptal emphysema, calcified granulomata noted bilaterally, no suspicious nodules, lung RADS 1, repeat in 12 months  09/23/2019-FVC 3.74  (81% predicted), postbronchodilator ratio 80, postbronchodilator FEV1 2.88 (86% predicted), no bronchodilator response, DLCO 19.44 (73% predicted)      Shortness of breath      No Known Allergies  Immunization History  Administered Date(s) Administered   Influenza Split 08/18/2014   Influenza,trivalent, recombinat, inj, PF 08/16/2013   Influenza-Unspecified 08/28/2018   Pneumococcal Conjugate-13 10/05/2018   Pneumococcal Polysaccharide-23 08/16/2013   Tdap 05/24/2009, 02/01/2016   Patient reports he is already received his flu vaccine this season Due for Pneumovax 23 after 10/06/2019  Past Medical History:  Diagnosis Date   Hyperlipidemia    Kidney stone     Tobacco History: Social History   Tobacco Use  Smoking Status Former Smoker   Packs/day: 2.00   Years: 46.00   Pack years: 92.00   Quit date: 2010   Years since quitting: 10.8  Smokeless Tobacco Never Used   Counseling given: Yes   Continue to not smoke  Outpatient Encounter Medications as of 09/23/2019  Medication Sig   aspirin 81 MG chewable tablet Chew 81 mg by mouth daily.   divalproex (DEPAKOTE) 250 MG DR tablet Take 250 mg by mouth daily.   divalproex (DEPAKOTE) 500 MG DR tablet Take 500 mg by mouth 3 (three) times daily.   doxycycline (VIBRAMYCIN) 100 MG capsule Take 100 mg by mouth as needed. For skin infections   FLUoxetine (PROZAC) 10 MG tablet Take 10 mg by mouth daily.   hydrOXYzine (ATARAX/VISTARIL) 10 MG tablet Take 10 mg by mouth daily as needed.  simvastatin (ZOCOR) 40 MG tablet Take 40 mg by mouth daily.   Tamsulosin HCl (FLOMAX PO) Take by mouth. PRN KIDNEY STONES   [DISCONTINUED] Budeson-Glycopyrrol-Formoterol (BREZTRI AEROSPHERE) 160-9-4.8 MCG/ACT AERO Inhale 2 puffs into the lungs 2 (two) times daily.   [DISCONTINUED] umeclidinium-vilanterol (ANORO ELLIPTA) 62.5-25 MCG/INH AEPB Inhale 1 puff into the lungs daily.   Tiotropium Bromide-Olodaterol (STIOLTO RESPIMAT)  2.5-2.5 MCG/ACT AERS Inhale 2 puffs into the lungs daily.   No facility-administered encounter medications on file as of 09/23/2019.      Review of Systems  Review of Systems  Constitutional: Positive for fatigue. Negative for activity change, chills, fever and unexpected weight change.  HENT: Negative for postnasal drip, rhinorrhea, sinus pressure, sinus pain and sore throat.   Eyes: Negative.   Respiratory: Positive for shortness of breath and wheezing. Negative for cough.   Cardiovascular: Negative for chest pain, palpitations and leg swelling.  Gastrointestinal: Negative for constipation, diarrhea, nausea and vomiting.  Endocrine: Negative.   Genitourinary: Negative.   Musculoskeletal: Negative.   Skin: Negative.   Allergic/Immunologic: Negative for environmental allergies.  Neurological: Negative for dizziness and headaches.  Psychiatric/Behavioral: Positive for sleep disturbance. Negative for dysphoric mood. The patient is not nervous/anxious.   All other systems reviewed and are negative.    Physical Exam  BP 112/78 (BP Location: Left Arm, Patient Position: Sitting, Cuff Size: Large)    Pulse 92    Temp 97.8 F (36.6 C) (Temporal)    Ht 5\' 11"  (1.803 m)    Wt 249 lb (112.9 kg)    SpO2 95% Comment: On RA   BMI 34.73 kg/m   Wt Readings from Last 5 Encounters:  09/23/19 249 lb (112.9 kg)  09/22/19 251 lb (113.9 kg)  08/09/19 248 lb (112.5 kg)  10/14/18 236 lb (107 kg)  10/05/18 232 lb (105.2 kg)    BMI Readings from Last 5 Encounters:  09/23/19 34.73 kg/m  09/22/19 35.01 kg/m  08/09/19 34.59 kg/m  10/14/18 32.92 kg/m  10/05/18 32.36 kg/m     Physical Exam Vitals signs and nursing note reviewed.  Constitutional:      General: He is not in acute distress.    Appearance: Normal appearance. He is obese.  HENT:     Head: Normocephalic and atraumatic.     Right Ear: Hearing, tympanic membrane, ear canal and external ear normal. There is no impacted cerumen.       Left Ear: Hearing, tympanic membrane, ear canal and external ear normal. There is no impacted cerumen.     Ears:     Comments: Hearing aids bilaterally    Nose: Nose normal. No mucosal edema or rhinorrhea.     Right Turbinates: Not enlarged.     Left Turbinates: Not enlarged.     Mouth/Throat:     Mouth: Mucous membranes are dry.     Pharynx: Oropharynx is clear. No oropharyngeal exudate.     Comments: Mallampati 3 Eyes:     Pupils: Pupils are equal, round, and reactive to light.  Neck:     Musculoskeletal: Normal range of motion.  Cardiovascular:     Rate and Rhythm: Normal rate. Rhythm regularly irregular. Occasional extrasystoles are present.    Pulses: Normal pulses.          Radial pulses are 2+ on the left side.     Heart sounds: Normal heart sounds. No murmur.  Pulmonary:     Effort: Pulmonary effort is normal. No respiratory distress.     Breath  sounds: Normal breath sounds. No decreased breath sounds, wheezing or rales.  Abdominal:     General: Bowel sounds are normal. There is no distension.     Palpations: Abdomen is soft.     Tenderness: There is no abdominal tenderness.  Musculoskeletal:     Right lower leg: 1+ Edema present.     Left lower leg: 1+ Edema present.  Lymphadenopathy:     Cervical: No cervical adenopathy.  Skin:    General: Skin is warm and dry.     Capillary Refill: Capillary refill takes less than 2 seconds.     Findings: No erythema or rash.  Neurological:     General: No focal deficit present.     Mental Status: He is alert and oriented to person, place, and time.     Motor: No weakness.     Coordination: Coordination normal.     Gait: Gait is intact. Gait normal.  Psychiatric:        Mood and Affect: Mood normal.        Behavior: Behavior normal. Behavior is cooperative.        Thought Content: Thought content normal.        Judgment: Judgment normal.       Assessment & Plan:   Abnormal findings on diagnostic imaging of  lung Plan: Please follow-up with primary care regarding coronary artery disease and plaque that they are seen on CT imaging Can also discuss CT imaging with cardiology as you are being referred to them today  Atrial fibrillation ([place]) EKG today showing A. fib  Plan: We will refer you to cardiology Echocardiogram ordered Lab work today   RBBB Plan: Referral to cardiology  Centrilobular emphysema ([place]) Plan: Stop Breztri Trial of Stiolto Respimat Tolerated walk in office today with no oxygen desaturations Continue lung cancer screening program in November/2021   At risk for sleep apnea Stop bang score today is 4 Unable to measure patient's neck Patient reports snoring New onset A. fib in office today  Plan: We will order home sleep study  Former smoker Plan: Continue to not smoke Continue with lung cancer screening program in November/2021  Shortness of breath Shortness of breath is likely multifactorial.  Pulmonary function testing today is okay.  Slightly reduced DLCO.  This is likely due to emphysema seen on CT imaging from patient's 92-pack-year smoking history.  Patient does have new onset A. fib which is a controlled rate today.  Plan: Lab work today Echocardiogram today Referral to cardiology Trial of Stiolto Respimat, do not believe patient needs ICS    Return in about 6 weeks (around 11/04/2019), or if symptoms worsen or fail to improve.   Lauraine Rinne, NP 09/23/2019   This appointment was 52 minutes long with over 50% of the time in direct face-to-face patient care, assessment, plan of care, and follow-up.

## 2019-09-22 NOTE — Telephone Encounter (Signed)
Patient stated that he Covid 19 for procedure, no symptoms but he has not been given results.

## 2019-09-22 NOTE — Progress Notes (Signed)
Shared Decision Making Visit Lung Cancer Screening Program [phone])   Eligibility:  Age 73 y.o.  Pack Years Smoking History Calculation 92 pack year smoking history (# packs/per year x # years smoked)  Recent History of coughing up blood  no  Unexplained weight loss? no ( >Than 15 pounds within the last 6 months )  Prior History Lung / other cancer no (Diagnosis within the last 5 years already requiring surveillance chest CT Scans).  Smoking Status Former Smoker  Former Smokers: Years since quit: 10 years  Quit Date: 2010  Visit Components:  Discussion included one or more decision making aids. yes  Discussion included risk/benefits of screening. yes  Discussion included potential follow up diagnostic testing for abnormal scans. yes  Discussion included meaning and risk of over diagnosis. yes  Discussion included meaning and risk of False Positives. yes  Discussion included meaning of total radiation exposure. yes  Counseling Included:  Importance of adherence to annual lung cancer LDCT screening. yes  Impact of comorbidities on ability to participate in the program. yes  Ability and willingness to under diagnostic treatment. yes  Smoking Cessation Counseling:  Current Smokers:   Discussed importance of smoking cessation. yes  Information about tobacco cessation classes and interventions provided to patient. yes  Patient provided with "ticket" for LDCT Scan. yes  Symptomatic Patient. no  Counseling  Diagnosis Code: Tobacco Use Z72.0  Asymptomatic Patient yes  Counseling (Intermediate counseling: > three minutes counseling) [id number]  Former Smokers:   Discussed the importance of maintaining cigarette abstinence. yes  Diagnosis Code: Personal History of Nicotine Dependence. [id number]  Information about tobacco cessation classes and interventions provided to patient. Yes  Patient provided with "ticket" for LDCT Scan. yes  Written Order for Lung Cancer  Screening with LDCT placed in Epic. Yes (CT Chest Lung Cancer Screening Low Dose W/O CM) [id number] Z12.2-Screening of respiratory organs Z87.891-Personal history of nicotine dependence  BP 124/68 (BP Location: Right Arm)   Pulse 88   Temp 97.7 F (36.5 C)   Ht 5\' 11"  (1.803 m)   Wt 251 lb (113.9 kg)   SpO2 95%   BMI 35.01 kg/m    I spent 25 minutes of face to face time with Joshua [name] discussing the risks and benefits of lung cancer screening. We viewed a power point together that explained in detail the above noted topics. We took the time to pause the power point at intervals to allow for questions to be asked and answered to ensure understanding. We discussed that he had taken the single most powerful action possible to decrease his risk of developing lung cancer when he quit smoking. I counseled him to remain smoke free, and to contact me if he ever had the desire to smoke again so that I can provide resources and tools to help support the effort to remain smoke free. We discussed the time and location of the scan, and that either  Doroteo Glassman RN or I will call with the results within  24-48 hours of receiving them. He has my card and contact information in the event he needs to speak with me, in addition to a copy of the power point we reviewed as a resource. He verbalized understanding of all of the above and had no further questions upon leaving the office.     I explained to the patient that there has been a high incidence of coronary artery disease noted on these exams. I explained that this is a non-gated exam  therefore degree or severity cannot be determined. This patient is not currently on statin therapy. I have asked the patient to follow-up with their PCP regarding any incidental finding of coronary artery disease and management with diet or medication as they feel is clinically indicated. The patient verbalized understanding of the above and had no further questions.     Joshua Spatz, NP 09/22/2019 11:43 AM

## 2019-09-22 NOTE — Patient Instructions (Signed)
Thank you for participating in the [facility]. It was our pleasure to meet you today. We will call you with the results of your scan within the next few days. Your scan will be assigned a Lung RADS category score by the physicians reading the scans.  This Lung RADS score determines follow up scanning.  See below for description of categories, and follow up screening recommendations. We will be in touch to schedule your follow up screening annually or based on recommendations of our providers. We will fax a copy of your scan results to your Primary Care Physician, or the physician who referred you to the program, to ensure they have the results. Please call the office if you have any questions or concerns regarding your scanning experience or results.  Our office number is [phone]. Please speak with Denise Phelps, RN. She is our Lung Cancer Screening RN. If she is unavailable when you call, please have the office staff send her a message. She will return your call at her earliest convenience. Remember, if your scan is normal, we will scan you annually as long as you continue to meet the criteria for the program. (Age 55-77, Current smoker or smoker who has quit within the last 15 years). If you are a smoker, remember, quitting is the single most powerful action that you can take to decrease your risk of lung cancer and other pulmonary, breathing related problems. We know quitting is hard, and we are here to help.  Please let us know if there is anything we can do to help you meet your goal of quitting. If you are a former smoker, congratulations. We are proud of you! Remain smoke free! Remember you can refer friends or family members through the number above.  We will screen them to make sure they meet criteria for the program. Thank you for helping us take better care of you by participating in Lung Screening.  Lung RADS Categories:  Lung RADS 1: no nodules  or definitely non-concerning nodules.  Recommendation is for a repeat annual scan in 12 months.  Lung RADS 2:  nodules that are non-concerning in appearance and behavior with a very low likelihood of becoming an active cancer. Recommendation is for a repeat annual scan in 12 months.  Lung RADS 3: nodules that are probably non-concerning , includes nodules with a low likelihood of becoming an active cancer.  Recommendation is for a 6-month repeat screening scan. Often noted after an upper respiratory illness. We will be in touch to make sure you have no questions, and to schedule your 6-month scan.  Lung RADS 4 A: nodules with concerning findings, recommendation is most often for a follow up scan in 3 months or additional testing based on our provider's assessment of the scan. We will be in touch to make sure you have no questions and to schedule the recommended 3 month follow up scan.  Lung RADS 4 B:  indicates findings that are concerning. We will be in touch with you to schedule additional diagnostic testing based on our provider's  assessment of the scan.   

## 2019-09-23 ENCOUNTER — Other Ambulatory Visit: Payer: Self-pay

## 2019-09-23 ENCOUNTER — Emergency Department (HOSPITAL_COMMUNITY): Payer: No Typology Code available for payment source

## 2019-09-23 ENCOUNTER — Telehealth: Payer: Self-pay | Admitting: Pulmonary Disease

## 2019-09-23 ENCOUNTER — Emergency Department (HOSPITAL_COMMUNITY)
Admission: EM | Admit: 2019-09-23 | Discharge: 2019-09-23 | Disposition: A | Discharge status: Home / Self Care | Payer: No Typology Code available for payment source | Attending: Emergency Medicine | Admitting: Emergency Medicine

## 2019-09-23 ENCOUNTER — Ambulatory Visit (INDEPENDENT_AMBULATORY_CARE_PROVIDER_SITE_OTHER): Payer: Medicare HMO | Admitting: Pulmonary Disease

## 2019-09-23 ENCOUNTER — Encounter: Payer: Self-pay | Admitting: Pulmonary Disease

## 2019-09-23 ENCOUNTER — Other Ambulatory Visit: Payer: Self-pay | Admitting: *Deleted

## 2019-09-23 VITALS — BP 112/78 | HR 92 | Temp 97.8°F | Ht 71.0 in | Wt 249.0 lb

## 2019-09-23 DIAGNOSIS — R0602 Shortness of breath: Secondary | ICD-10-CM | POA: Insufficient documentation

## 2019-09-23 DIAGNOSIS — R0609 Other forms of dyspnea: Secondary | ICD-10-CM

## 2019-09-23 DIAGNOSIS — R918 Other nonspecific abnormal finding of lung field: Secondary | ICD-10-CM

## 2019-09-23 DIAGNOSIS — Z79899 Other long term (current) drug therapy: Secondary | ICD-10-CM | POA: Insufficient documentation

## 2019-09-23 DIAGNOSIS — Z87891 Personal history of nicotine dependence: Secondary | ICD-10-CM | POA: Insufficient documentation

## 2019-09-23 DIAGNOSIS — I4891 Unspecified atrial fibrillation: Secondary | ICD-10-CM

## 2019-09-23 DIAGNOSIS — Z7982 Long term (current) use of aspirin: Secondary | ICD-10-CM | POA: Insufficient documentation

## 2019-09-23 DIAGNOSIS — J432 Centrilobular emphysema: Secondary | ICD-10-CM

## 2019-09-23 DIAGNOSIS — R0600 Dyspnea, unspecified: Secondary | ICD-10-CM

## 2019-09-23 DIAGNOSIS — R7989 Other specified abnormal findings of blood chemistry: Secondary | ICD-10-CM | POA: Insufficient documentation

## 2019-09-23 DIAGNOSIS — Z122 Encounter for screening for malignant neoplasm of respiratory organs: Secondary | ICD-10-CM

## 2019-09-23 DIAGNOSIS — I4510 Unspecified right bundle-branch block: Secondary | ICD-10-CM

## 2019-09-23 DIAGNOSIS — Z9189 Other specified personal risk factors, not elsewhere classified: Secondary | ICD-10-CM | POA: Insufficient documentation

## 2019-09-23 HISTORY — DX: Centrilobular emphysema: J43.2

## 2019-09-23 HISTORY — DX: Unspecified atrial fibrillation: I48.91

## 2019-09-23 HISTORY — DX: Shortness of breath: R06.02

## 2019-09-23 HISTORY — DX: Other nonspecific abnormal finding of lung field: R91.8

## 2019-09-23 LAB — CBC WITH DIFFERENTIAL/PLATELET
Basophils Absolute: 0 10*3/uL (ref 0.0–0.1)
Basophils Relative: 0.8 % (ref 0.0–3.0)
Eosinophils Absolute: 0.1 10*3/uL (ref 0.0–0.7)
Eosinophils Relative: 1.4 % (ref 0.0–5.0)
HCT: 42.4 % (ref 39.0–52.0)
Hemoglobin: 14.2 g/dL (ref 13.0–17.0)
Lymphocytes Relative: 23.3 % (ref 12.0–46.0)
Lymphs Abs: 1.2 10*3/uL (ref 0.7–4.0)
MCHC: 33.4 g/dL (ref 30.0–36.0)
MCV: 101.8 fl — ABNORMAL HIGH (ref 78.0–100.0)
Monocytes Absolute: 0.9 10*3/uL (ref 0.1–1.0)
Monocytes Relative: 17.2 % — ABNORMAL HIGH (ref 3.0–12.0)
Neutro Abs: 2.9 10*3/uL (ref 1.4–7.7)
Neutrophils Relative %: 57.3 % (ref 43.0–77.0)
Platelets: 194 10*3/uL (ref 150.0–400.0)
RBC: 4.17 Mil/uL — ABNORMAL LOW (ref 4.22–5.81)
RDW: 14.2 % (ref 11.5–15.5)
WBC: 5.1 10*3/uL (ref 4.0–10.5)

## 2019-09-23 LAB — PULMONARY FUNCTION TEST
DL/VA % pred: 83 %
DL/VA: 3.34 ml/min/mmHg/L
DLCO unc % pred: 73 %
DLCO unc: 19.44 ml/min/mmHg
FEF 25-75 Post: 2.46 L/sec
FEF 25-75 Pre: 2.3 L/sec
FEF2575-%Change-Post: 6 %
FEF2575-%Pred-Post: 97 %
FEF2575-%Pred-Pre: 91 %
FEV1-%Change-Post: 1 %
FEV1-%Pred-Post: 86 %
FEV1-%Pred-Pre: 84 %
FEV1-Post: 2.88 L
FEV1-Pre: 2.84 L
FEV1FVC-%Change-Post: 5 %
FEV1FVC-%Pred-Pre: 103 %
FEV6-%Change-Post: -3 %
FEV6-%Pred-Post: 83 %
FEV6-%Pred-Pre: 86 %
FEV6-Post: 3.58 L
FEV6-Pre: 3.71 L
FEV6FVC-%Change-Post: 0 %
FEV6FVC-%Pred-Post: 105 %
FEV6FVC-%Pred-Pre: 105 %
FVC-%Change-Post: -4 %
FVC-%Pred-Post: 78 %
FVC-%Pred-Pre: 81 %
FVC-Post: 3.58 L
FVC-Pre: 3.74 L
Post FEV1/FVC ratio: 80 %
Post FEV6/FVC ratio: 100 %
Pre FEV1/FVC ratio: 76 %
Pre FEV6/FVC Ratio: 99 %
RV % pred: 82 %
RV: 2.08 L
TLC % pred: 82 %
TLC: 5.96 L

## 2019-09-23 LAB — COMPREHENSIVE METABOLIC PANEL
ALT: 30 U/L (ref 0–53)
AST: 46 U/L — ABNORMAL HIGH (ref 0–37)
Albumin: 4.1 g/dL (ref 3.5–5.2)
Alkaline Phosphatase: 60 U/L (ref 39–117)
BUN: 26 mg/dL — ABNORMAL HIGH (ref 6–23)
CO2: 28 mEq/L (ref 19–32)
Calcium: 9.2 mg/dL (ref 8.4–10.5)
Chloride: 106 mEq/L (ref 96–112)
Creatinine, Ser: 1.4 mg/dL (ref 0.40–1.50)
GFR: 49.94 mL/min — ABNORMAL LOW (ref >60.00)
Glucose, Bld: 89 mg/dL (ref 70–99)
Potassium: 4.7 mEq/L (ref 3.5–5.1)
Sodium: 143 mEq/L (ref 135–145)
Total Bilirubin: 0.7 mg/dL (ref 0.2–1.2)
Total Protein: 6.9 g/dL (ref 6.0–8.3)

## 2019-09-23 LAB — TSH: TSH: 1.84 u[IU]/mL (ref 0.35–4.50)

## 2019-09-23 LAB — D-DIMER, QUANTITATIVE (NOT AT ARMC): D-Dimer, Quant: 6.55 mcg/mL FEU — ABNORMAL HIGH (ref <0.50)

## 2019-09-23 MED ORDER — APIXABAN (ELIQUIS) EDUCATION KIT FOR DVT/PE PATIENTS
PACK | Freq: Once | Status: DC
  Filled 2019-09-23: qty 1

## 2019-09-23 MED ORDER — STIOLTO RESPIMAT 2.5-2.5 MCG/ACT IN AERS: 2.0000 | INHALATION_SPRAY | Freq: Every day | RESPIRATORY_TRACT | 0 refills | Status: DC

## 2019-09-23 MED ORDER — IOHEXOL 350 MG/ML SOLN
100.0000 mL | Freq: Once | INTRAVENOUS | Status: AC | PRN
  Administered 2019-09-23: 100 mL via INTRAVENOUS

## 2019-09-23 MED ORDER — APIXABAN 5 MG PO TABS
5.0000 mg | ORAL_TABLET | Freq: Two times a day (BID) | ORAL | Status: DC
  Administered 2019-09-23: 5 mg via ORAL
  Filled 2019-09-23 (×2): qty 1

## 2019-09-23 MED ORDER — APIXABAN 5 MG PO TABS: 5.0000 mg | ORAL_TABLET | Freq: Two times a day (BID) | ORAL | 0 refills | Status: DC

## 2019-09-23 NOTE — ED Triage Notes (Signed)
Pt here for shob with exertion x several months. Saw NP Warner Mccreedy today who did blood work and his d dimer was high. Pt in afib but denies hx of such. Denies pain. Pt's blood work from earlier today in chart.

## 2019-09-23 NOTE — Assessment & Plan Note (Addendum)
EKG today showing A. fib  Plan: We will refer you to cardiology Echocardiogram ordered Lab work today

## 2019-09-23 NOTE — Patient Instructions (Addendum)
You were seen today by Lauraine Rinne, NP  for:   1. Shortness of breath  - EKG 12-Lead - CBC with Differential/Platelet; Future - Comp Met (CMET); Future - Ambulatory referral to Cardiology - D-Dimer, Quantitative; Future - ECHOCARDIOGRAM COMPLETE; Future - TSH; Future  EKG showing atrial fibrillation.  With your history of right bundle branch block as well as with the A. fib on EKG today as well as persistent bouts of shortness of breath I will order an echocardiogram to further evaluate your shortness of breath.  2. Centrilobular emphysema (HCC)  - Tiotropium Bromide-Olodaterol (STIOLTO RESPIMAT) 2.5-2.5 MCG/ACT AERS; Inhale 2 puffs into the lungs daily.  Dispense: 4 g; Refill: 0  STOP BREZTRI Inhaler   Stiolto Respimat inhaler >>>2 puffs daily >>>Take this no matter what >>>This is not a rescue inhaler   3. Former smoker  Continue to not smoke  4. Abnormal findings on diagnostic imaging of lung  Can discuss with cardiology the fact that your CT imaging does show coronary artery disease.  You are already on a medication help with cholesterol  5. Atrial fibrillation, unspecified type ([place])  - EKG 12-Lead - CBC with Differential/Platelet; Future - Comp Met (CMET); Future - Ambulatory referral to Cardiology - D-Dimer, Quantitative; Future - ECHOCARDIOGRAM COMPLETE; Future - TSH; Future  I will refer you to cardiology  I do believe you are also at risk for obstructive sleep apnea.  Your stop bang score today was elevated and you are reporting that you persistently snore.  We will order a home sleep study to further evaluate  We recommend today:  Orders Placed This Encounter  Procedures  . CBC with Differential/Platelet    Standing Status:   Future    Standing Expiration Date:   09/22/2020  . Comp Met (CMET)    Standing Status:   Future    Standing Expiration Date:   09/22/2020  . D-Dimer, Quantitative    Standing Status:   Future    Standing Expiration Date:    09/22/2020  . TSH    Standing Status:   Future    Standing Expiration Date:   09/22/2020  . Ambulatory referral to Cardiology    Referral Priority:   Urgent    Referral Type:   Consultation    Referral Reason:   Specialty Services Required    Requested Specialty:   Cardiology    Number of Visits Requested:   1  . EKG 12-Lead  . ECHOCARDIOGRAM COMPLETE    Standing Status:   Future    Standing Expiration Date:   12/23/2020    Order Specific Question:   Where should this test be performed    Answer:   [facility])    Order Specific Question:   Does the patient weigh less than or greater than 250 lbs?    Answer:   Patient weighs less than 250 lbs    Order Specific Question:   Perflutren DEFINITY (image enhancing agent) should be administered unless hypersensitivity or allergy exist    Answer:   Administer Perflutren    Order Specific Question:   Reason for exam-Echo    Answer:   Atrial Fibrillation  427.31 / I48.91  . Home sleep test    Standing Status:   Future    Standing Expiration Date:   09/22/2020    Order Specific Question:   Where should this test be performed:    Answer:   [facility] - Pulmonary   Orders Placed  This Encounter  Procedures  . CBC with Differential/Platelet  . Comp Met (CMET)  . D-Dimer, Quantitative  . TSH  . Ambulatory referral to Cardiology  . EKG 12-Lead  . ECHOCARDIOGRAM COMPLETE  . Home sleep test   Meds ordered this encounter  Medications  . Tiotropium Bromide-Olodaterol (STIOLTO RESPIMAT) 2.5-2.5 MCG/ACT AERS    Sig: Inhale 2 puffs into the lungs daily.    Dispense:  4 g    Refill:  0    Order Specific Question:   Lot Number?    Answer:   [id number]    Order Specific Question:   Expiration Date?    Answer:   11/03/2021    Follow Up:    Return in about 6 weeks (around 11/04/2019), or if symptoms worsen or fail to improve.   Please do your part to reduce the spread of COVID-19:      Reduce your risk of any infection  and  COVID19 by using the similar precautions used for avoiding the common cold or flu:  Marland Kitchen Wash your hands often with soap and warm water for at least 20 seconds.  If soap and water are not readily available, use an alcohol-based hand sanitizer with at least 60% alcohol.  . If coughing or sneezing, cover your mouth and nose by coughing or sneezing into the elbow areas of your shirt or coat, into a tissue or into your sleeve (not your hands). Langley Gauss A MASK when in public  . Avoid shaking hands with others and consider head nods or verbal greetings only. . Avoid touching your eyes, nose, or mouth with unwashed hands.  . Avoid close contact with people who are sick. . Avoid places or events with large numbers of people in one location, like concerts or sporting events. . If you have some symptoms but not all symptoms, continue to monitor at home and seek medical attention if your symptoms worsen. . If you are having a medical emergency, call 911.   [place] / e-Visit: [email or web address]         [facility]: [facility]: [phone]                   [facility]: [phone]     It is flu season:   >>> Best ways to protect herself from the flu: Receive the yearly flu vaccine, practice good hand hygiene washing with soap and also using hand sanitizer when available, eat a nutritious meals, get adequate rest, hydrate appropriately   Please contact the office if your symptoms worsen or you have concerns that you are not improving.   Thank you for choosing [facility] for your healthcare, and for allowing [place] to partner with you on your healthcare journey. I am thankful to be able to provide care to you today.   Wyn Quaker FNP-C

## 2019-09-23 NOTE — Assessment & Plan Note (Signed)
Stop bang score today is 4 Unable to measure patient's neck Patient reports snoring New onset A. fib in office today  Plan: We will order home sleep study

## 2019-09-23 NOTE — Progress Notes (Signed)
See telephone encounter on 09/23/2019.  I contacted the patient personally.  Patient sent to emergency room for further evaluation.  Notified spouse.  Wyn Quaker, FNP

## 2019-09-23 NOTE — Assessment & Plan Note (Signed)
Plan: Referral to cardiology 

## 2019-09-23 NOTE — Telephone Encounter (Signed)
Reviewed patient's chart. Patient checked into the ED at 4pm this afternoon.

## 2019-09-23 NOTE — ED Provider Notes (Signed)
[facility] EMERGENCY DEPARTMENT Provider Note   CSN: [id number] Arrival date & time: 09/23/19  1613     History   Chief Complaint Chief Complaint  Patient presents with   Shortness of Breath    HPI Jakylan Rhodd Schwenke III is a 73 y.o. male with a past medical history of A. Fib, emphysema, former smoker, right bundle branch block, who presents today for evaluation of an elevated D-dimer.  He was seen today by Wyn Quaker, NP with pulmonology for shortness of breath.  Labs were obtained which are available in epic significant for a D-dimer of 6.55.  He was also noted to be in A. fib today.  He has remote notes from 2013 stating that he was in A. Fib however it appears that it only lasted about 6 hours and he was not placed on anticoagulation at that time.  It does not appear that he has been seen by cardiology since and does not have a EKG in our system.       HPI  Past Medical History:  Diagnosis Date   Hyperlipidemia    Kidney stone     Patient Active Problem List   Diagnosis Date Noted   Centrilobular emphysema ([place]) 09/23/2019   Former smoker 09/23/2019   Abnormal findings on diagnostic imaging of lung 09/23/2019   Shortness of breath 09/23/2019   Atrial fibrillation ([place]) 09/23/2019   At risk for sleep apnea 09/23/2019   Nontraumatic incomplete tear of right rotator cuff 10/14/2018   History of adenomatous polyp of colon 03/03/2018   RBBB 06/09/2012   Nephrolithiasis 05/22/2011   Other and unspecified hyperlipidemia 11/20/2009   DDD (degenerative disc disease), cervical 05/13/2008   Impaired fasting glucose 10/27/2007    Past Surgical History:  Procedure Laterality Date   HEMORRHOID SURGERY          Home Medications    Prior to Admission medications   Medication Sig Start Date End Date Taking? Authorizing Provider  apixaban (ELIQUIS) 5 MG TABS tablet Take 1 tablet (5 mg total) by mouth 2 (two) times daily. 09/23/19  10/23/19  Lorin Glass, PA-C  aspirin 81 MG chewable tablet Chew 81 mg by mouth daily.    [provider]  divalproex (DEPAKOTE) 250 MG DR tablet Take 250 mg by mouth daily.    [provider]  divalproex (DEPAKOTE) 500 MG DR tablet Take 500 mg by mouth 3 (three) times daily.    [provider]  doxycycline (VIBRAMYCIN) 100 MG capsule Take 100 mg by mouth as needed. For skin infections    [provider]  FLUoxetine (PROZAC) 10 MG tablet Take 10 mg by mouth daily.    [provider]  hydrOXYzine (ATARAX/VISTARIL) 10 MG tablet Take 10 mg by mouth daily as needed.    [provider]  simvastatin (ZOCOR) 40 MG tablet Take 40 mg by mouth daily.    [provider]  Tamsulosin HCl (FLOMAX PO) Take by mouth. PRN KIDNEY STONES    [provider]  Tiotropium Bromide-Olodaterol (STIOLTO RESPIMAT) 2.5-2.5 MCG/ACT AERS Inhale 2 puffs into the lungs daily. 09/23/19   Lauraine Rinne, NP    Family History Family History  Problem Relation Age of Onset   Varicose Veins Mother    Stroke Father     Social History Social History   Tobacco Use   Smoking status: Former Smoker    Packs/day: 2.00    Years: 46.00    Pack years: 92.00  Quit date: 2010    Years since quitting: 10.8   Smokeless tobacco: Never Used  Substance Use Topics   Alcohol use: No   Drug use: No     Allergies   Patient has no known allergies.   Review of Systems Review of Systems  Constitutional: Negative for chills and fever.  HENT: Negative for congestion.   Respiratory: Positive for chest tightness (Over many months) and shortness of breath. Negative for cough and wheezing.   Cardiovascular: Positive for leg swelling. Negative for chest pain and palpitations.  Gastrointestinal: Negative for abdominal pain, diarrhea, nausea and vomiting.  All other systems reviewed and are negative.    Physical Exam Updated Vital Signs BP (!)  137/92    Pulse 92    Temp 97.6 F (36.4 C) (Oral)    Resp 17    SpO2 97%   Physical Exam Vitals signs and nursing note reviewed.  Constitutional:      General: He is not in acute distress.    Appearance: He is well-developed.  HENT:     Head: Normocephalic and atraumatic.  Eyes:     Conjunctiva/sclera: Conjunctivae normal.  Neck:     Musculoskeletal: Neck supple.  Cardiovascular:     Rate and Rhythm: Normal rate. Rhythm irregularly irregular.     Heart sounds: No murmur.  Pulmonary:     Effort: Pulmonary effort is normal. No accessory muscle usage or respiratory distress.     Breath sounds: Normal breath sounds. No decreased breath sounds.  Chest:     Chest wall: No tenderness.  Abdominal:     Palpations: Abdomen is soft.     Tenderness: There is no abdominal tenderness.  Musculoskeletal:     Right lower leg: He exhibits no tenderness. No edema.     Left lower leg: He exhibits no tenderness. No edema.  Skin:    General: Skin is warm and dry.  Neurological:     General: No focal deficit present.     Mental Status: He is alert.  Psychiatric:        Mood and Affect: Mood normal.        Behavior: Behavior normal.      ED Treatments / Results  Labs (all labs ordered are listed, but only abnormal results are displayed) Labs Reviewed - No data to display  EKG EKG Interpretation  Date/Time:  Thursday September 23 2019 16:20:26 EST Ventricular Rate:  98 PR Interval:    QRS Duration: 152 QT Interval:  380 QTC Calculation: 485 R Axis:   73 Text Interpretation: Atrial fibrillation Right bundle branch block No previous tracing Confirmed by Blanchie Dessert [phone]) on 09/23/2019 8:46:58 PM   Radiology Dg Chest 2 View  Result Date: 09/23/2019 CLINICAL DATA:  73 year old male with shortness of breath. EXAM: CHEST - 2 VIEW COMPARISON:  Chest CT dated 09/22/2019. FINDINGS: Background of emphysema. No focal consolidation, pleural effusion, or pneumothorax. The cardiac  silhouette is within normal limits. Mild atherosclerotic calcification of the aortic arch. No acute osseous pathology. IMPRESSION: 1. No active cardiopulmonary disease. 2. Emphysema. Electronically Signed   By: Anner Crete M.D.   On: 09/23/2019 16:55   Ct Angio Chest Pe W And/or Wo Contrast  Result Date: 09/23/2019 CLINICAL DATA:  PE suspected, intermediate prob, positive D-dimer Shortness of breath with exertion for months. EXAM: CT ANGIOGRAPHY CHEST WITH CONTRAST TECHNIQUE: Multidetector CT imaging of the chest was performed using the standard protocol during bolus administration of intravenous contrast. Multiplanar CT image  reconstructions and MIPs were obtained to evaluate the vascular anatomy. CONTRAST:  116mL OMNIPAQUE IOHEXOL 350 MG/ML SOLN COMPARISON:  Chest radiograph earlier this day. Chest CT lung cancer screening yesterday. FINDINGS: Cardiovascular: There are no filling defects within the pulmonary arteries to suggest pulmonary embolus. Upper normal heart size. No pericardial effusion. There are coronary artery calcifications. Mild aortic atherosclerosis. No aortic aneurysm. Cannot assess for dissection given phase of contrast tailored for pulmonary artery evaluation. Mediastinum/Nodes: Small mediastinal nodes not enlarged by size criteria. No hilar adenopathy. Small calcified nodes at the right hilum again seen consistent with prior granulomatous disease. No esophageal wall thickening. Lungs/Pleura: Mild hypoventilatory changes in the dependent lungs, new from CT yesterday. No focal airspace disease or pleural effusion. Subpleural reticulation, not as well assessed given mild motion artifact currently. Scattered calcified granuloma again seen. Mild emphysema. Trachea and mainstem bronchi are patent. No pulmonary edema. Biapical pleuroparenchymal scarring. Upper Abdomen: Calcified splenic granuloma. No acute findings. Nonobstructing stone in the upper left kidney. Musculoskeletal: There are no  acute or suspicious osseous abnormalities. Mild anterior wedging of midthoracic vertebra, unchanged. Review of the MIP images confirms the above findings. IMPRESSION: 1. No pulmonary embolus. 2. Mild hypoventilatory atelectasis, otherwise no change from lung cancer screening CT yesterday. No acute findings. 3. Mild emphysema. Subpleural reticulation prior exam is currently partially obscured by breathing motion. 4. Coronary arteries.  Mild aortic atherosclerosis. Aortic Atherosclerosis (ICD10-I70.0) and Emphysema (ICD10-J43.9). Electronically Signed   By: Keith Rake M.D.   On: 09/23/2019 22:07    Procedures Procedures (including critical care time)  Medications Ordered in ED Medications  apixaban (ELIQUIS) tablet 5 mg (has no administration in time range)  iohexol (OMNIPAQUE) 350 MG/ML injection 100 mL (100 mLs Intravenous Contrast Given 09/23/19 2141)     Initial Impression / Assessment and Plan / ED Course  I have reviewed the triage vital signs and the nursing notes.  Pertinent labs & imaging results that were available during my care of the patient were reviewed by me and considered in my medical decision making (see chart for details).  Clinical Course as of Sep 22 2252  Thu Sep 23, 2019  2224 Spoke with cardiology Dr. Winfield Cunas.  Given his age, and CT scan with arterial sclerosis his CHA2DS2-VASc score is 2.  We will start him on 5 mg eliquis bid.    [EH]    Clinical Course User Index [EH] Ollen Gross      Chadsvasc score 2   Patient presents today for evaluation of shortness of breath and elevated D-dimer.  He had labs obtained earlier today as an outpatient.  He is currently being evaluated by pulmonology for his shortness of breath which has been present over many months.  Chart review shows his D-dimer was elevated over 6.  CTA PE study was obtained without evidence of PE.  He does appear to be in A. fib today which is a new diagnosis for him and he  does not currently follow with cardiology.  He has a outpatient follow-up appointment set up.  Chads vas score is 2.  I spoke with on-call cardiology who recommended starting on Eliquis with outpatient follow-up.  Pharmacy consult for Eliquis education was placed.  Patient is given a dose of Eliquis in the emergency room.  He has not had any recent surgeries or procedures, denies significant history of GI bleeding, ICH or other contraindications.  I discussed bleeding risk with patient along with returning for DVT US in AM  given elevated d-dimer.  Order for outpatient DVT study was placed.  Patient is aware that if this is positive he may need a change in his Eliquis dosing.    This patient was seen as a shared visit with Dr. Maryan Rued.    Return precautions were discussed with patient who states their understanding.  At the time of discharge patient denied any unaddressed complaints or concerns.  Patient is agreeable for discharge home.   Final Clinical Impressions(s) / ED Diagnoses   Final diagnoses:  Shortness of breath  Elevated d-dimer  Atrial fibrillation, unspecified type [facility])    ED Discharge Orders         Ordered    LE VENOUS     09/23/19 2244    apixaban (ELIQUIS) 5 MG TABS tablet  2 times daily     09/23/19 2249           Lorin Glass, Hershal Coria 09/23/19 2318    Blanchie Dessert, MD 09/23/19 2359

## 2019-09-23 NOTE — Progress Notes (Signed)
Patient seen in the office today and instructed on use of Stiolto.  Patient expressed understanding and demonstrated technique.  Benetta Spar [facility] 09/23/2019

## 2019-09-23 NOTE — Telephone Encounter (Signed)
09/23/2019 1437  Spoke with patient spouse [place].  Updated her regarding patient's plan of care.  She is currently at work but plans on leaving to meet the patient at [facility] emergency room.  Wyn Quaker, FNP

## 2019-09-23 NOTE — Telephone Encounter (Signed)
09/23/2019 1414  Attempted to reach the patient.  Unfortunately was unable to leave voicemail.  Attempted to reach the patients spouse on her mobile number. Work number for spouse is invalid per answering individual.  Attempted to reach the patient again and was able to reach him on his mobile telephone number.  Patient's lab work from today's office visit is concerning.  09/23/2019 D-dimer is elevated 6.55.  Although patient's walk in office today as well as vital signs were stable.  I am concerned regarding his new onset atrial fibrillation.  I am also concerned he may potentially have a clot given the elevations in his D-dimer, new afib, and shortness of breath.    I believe he needs to present to an emergency room for further evaluation.  He also likely will need CT imaging and lower extremity Dopplers.  I would recommend that he present to [facility] emergency room for further evaluation and assessment.  Patient agrees to present to [facility] emergency room for further evaluation.  He is aware that he may need CT imaging.  He is aware that he could be admitted to the hospital based off of the work-up in the emergency room.  Patient will need follow-up in our office within a week of discharge from the emergency room or hospital.  Patient still needs to establish with cardiology.  I have updated Dr. Valeta Harms regarding the patient's plan of care.  Wyn Quaker, FNP

## 2019-09-23 NOTE — Assessment & Plan Note (Signed)
Plan: Continue to not smoke Continue with lung cancer screening program in November/2021

## 2019-09-23 NOTE — Assessment & Plan Note (Signed)
Shortness of breath is likely multifactorial.  Pulmonary function testing today is okay.  Slightly reduced DLCO.  This is likely due to emphysema seen on CT imaging from patient's 92-pack-year smoking history.  Patient does have new onset A. fib which is a controlled rate today.  Plan: Lab work today Echocardiogram today Referral to cardiology Trial of Stiolto Respimat, do not believe patient needs ICS

## 2019-09-23 NOTE — Assessment & Plan Note (Addendum)
Plan: Stop Breztri Trial of Stiolto Respimat Tolerated walk in office today with no oxygen desaturations Continue lung cancer screening program in November/2021

## 2019-09-23 NOTE — Progress Notes (Signed)
PFT done today. 

## 2019-09-23 NOTE — Assessment & Plan Note (Addendum)
Plan: Please follow-up with primary care regarding coronary artery disease and plaque that they are seen on CT imaging Can also discuss CT imaging with cardiology as you are being referred to them today

## 2019-09-23 NOTE — Discharge Instructions (Signed)
If your symptoms worsen, or you have any concerns please seek additional medical care and evaluation.

## 2019-09-23 NOTE — Progress Notes (Signed)
Discussed results with patient in office.  Referred to cardiology. Lab work ordered.   Wyn Quaker FNP

## 2019-09-24 ENCOUNTER — Ambulatory Visit (HOSPITAL_COMMUNITY)
Admission: RE | Admit: 2019-09-24 | Discharge: 2019-09-24 | Disposition: A | Discharge status: Home / Self Care | Payer: Medicare HMO | Source: Ambulatory Visit | Attending: Physician Assistant | Admitting: Physician Assistant

## 2019-09-24 ENCOUNTER — Telehealth: Payer: Self-pay | Admitting: Pulmonary Disease

## 2019-09-24 DIAGNOSIS — R7989 Other specified abnormal findings of blood chemistry: Secondary | ICD-10-CM

## 2019-09-24 NOTE — Telephone Encounter (Signed)
lmtcb for wife Joshua [name] to [employer]

## 2019-09-24 NOTE — Telephone Encounter (Signed)
Spoke with pt's wife Izora Gala, aware of Brian's recs.  Scheduled for televisit with Dr. Valeta Harms on 11/25 at 9:45.   While on the phone, wife had 2 questions;  Since doppler was negative but D-dimer was so high, what are the next steps of care?  Is there any concern with a blood clot somewhere else that wasn't picked up on doppler? How concerned are we that the D-dimer is this elevated?  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 (I looked in chart but was unable to find documentation of his staging).   Aaron Edelman please advise.  Thanks!

## 2019-09-24 NOTE — Telephone Encounter (Signed)
Spoke with wife Izora Gala, aware of recs.  Nothing further needed at this time- will close encounter.

## 2019-09-24 NOTE — Telephone Encounter (Signed)
Pt wife returning call and would like a call back. Does has more questions about husband's leg clots and emphysema

## 2019-09-24 NOTE — Telephone Encounter (Signed)
Left message for Joshua [name] to call us back.

## 2019-09-24 NOTE — Telephone Encounter (Signed)
09/24/2019 1126  Per the preliminary results from the vascular lab looks like the patient does not have deep vein thrombosis.  This is good news.  See those results listed below:  Summary: Right: There is no evidence of deep vein thrombosis in the lower extremity. No cystic structure found in the popliteal fossa. Left: There is no evidence of deep vein thrombosis in the lower extremity. No cystic structure found in the popliteal fossa.   I believe it is okay for the patient to go home.  Next steps would be to maintain Eliquis, establish with cardiology as well as have follow-up with our office.  As outlined in the original note listed below.  Wyn Quaker, FNP

## 2019-09-24 NOTE — Telephone Encounter (Signed)
09/24/2019 1113  Can we please contact the patient to get him scheduled for a 1 to 2-week telephonic visit or an office visit.  Patient recently was discharged from the emergency room.  Patient continues to be in A. fib.  Patient was started on Eliquis.  Patient has upcoming appointment for lower extremity Dopplers.  Thankfully CTA chest was negative.  Patient should proceed forward with cardiology appointment as outlined in our last office visit.  Wyn Quaker, FNP

## 2019-09-24 NOTE — Telephone Encounter (Signed)
Patient wife called states that they are still at the Vascular lab and just visit doppler for DVT - they want to know if they should go home or what are the next steps - [phone]

## 2019-09-24 NOTE — Progress Notes (Signed)
Lower extremity venous has been completed.   Preliminary results in CV Proc.   Abram Sander 09/24/2019 11:08 AM

## 2019-09-24 NOTE — Telephone Encounter (Signed)
09/24/2019 1505  Since Doppler negative as well as CTA chest is negative this is good news.  Continue forward with Eliquis.  This will help protect the patient from future DVTs or PEs or thrombolytic events.  Since patient has been in A. fib this is why he has a pending referral to cardiology and also puts him at a higher risk of having these events is why he needs to continue with Eliquis.  D-dimer is a sensitive but nonspecific test.  This means that sometimes it can be elevated not indicate anything.  Unfortunately though in the setting of A. fib, worsening shortness of breath as well as an elevated D-dimer that is why he was recommended to present to an emergency room out of the concern that he could have had a clot.  Now that those items have been ruled out as well as started on blood thinners we will just continue to monitor him clinically.  And he need to establish with cardiology  09/23/2019 pulmonary function testing this did not show formal obstruction or COPD.  It did show a slightly reduced diffusion capacity and this is due to the patient's emphysema that was seen on his recent CT imaging.  Thus meaning patient can be started on medication to help with his emphysema but he does not technically have the obstruction on his pulmonary function testing to receive the diagnosis of COPD.  Wyn Quaker FNP

## 2019-09-27 ENCOUNTER — Telehealth: Payer: Self-pay | Admitting: Pulmonary Disease

## 2019-09-27 NOTE — Telephone Encounter (Signed)
Spoke with Avaletta. She stated that she was calling because the patient was seen in the ED for afib and a referral was placed for the afib clinic. Advised her that the referral to the afib clinic would be ok. She verbalized understanding. Nothing further needed at time of call.

## 2019-09-27 NOTE — Telephone Encounter (Signed)
Left detailed message for Avaletta to call back. Spoke with Aaron Edelman, patient will still need to be seen by cardiologist for new onset Afib.

## 2019-09-28 ENCOUNTER — Other Ambulatory Visit (HOSPITAL_COMMUNITY): Payer: Self-pay | Admitting: *Deleted

## 2019-09-28 ENCOUNTER — Ambulatory Visit (HOSPITAL_COMMUNITY)
Admission: RE | Admit: 2019-09-28 | Discharge: 2019-09-28 | Disposition: A | Discharge status: Home / Self Care | Payer: No Typology Code available for payment source | Source: Ambulatory Visit | Attending: Physician Assistant | Admitting: Physician Assistant

## 2019-09-28 ENCOUNTER — Encounter (HOSPITAL_COMMUNITY): Payer: Self-pay | Admitting: Physician Assistant

## 2019-09-28 ENCOUNTER — Other Ambulatory Visit: Payer: Self-pay

## 2019-09-28 VITALS — BP 112/78 | HR 96 | Ht 71.0 in | Wt 249.4 lb

## 2019-09-28 DIAGNOSIS — I700 Atherosclerosis of aorta: Secondary | ICD-10-CM | POA: Insufficient documentation

## 2019-09-28 DIAGNOSIS — Z6834 Body mass index (BMI) 34.0-34.9, adult: Secondary | ICD-10-CM | POA: Insufficient documentation

## 2019-09-28 DIAGNOSIS — R9431 Abnormal electrocardiogram [ECG] [EKG]: Secondary | ICD-10-CM | POA: Diagnosis not present

## 2019-09-28 DIAGNOSIS — I4510 Unspecified right bundle-branch block: Secondary | ICD-10-CM | POA: Insufficient documentation

## 2019-09-28 DIAGNOSIS — Z87891 Personal history of nicotine dependence: Secondary | ICD-10-CM | POA: Diagnosis not present

## 2019-09-28 DIAGNOSIS — J439 Emphysema, unspecified: Secondary | ICD-10-CM | POA: Insufficient documentation

## 2019-09-28 DIAGNOSIS — I4819 Other persistent atrial fibrillation: Secondary | ICD-10-CM

## 2019-09-28 DIAGNOSIS — Z823 Family history of stroke: Secondary | ICD-10-CM | POA: Diagnosis not present

## 2019-09-28 DIAGNOSIS — I2510 Atherosclerotic heart disease of native coronary artery without angina pectoris: Secondary | ICD-10-CM | POA: Diagnosis not present

## 2019-09-28 DIAGNOSIS — E669 Obesity, unspecified: Secondary | ICD-10-CM | POA: Diagnosis not present

## 2019-09-28 DIAGNOSIS — Z7901 Long term (current) use of anticoagulants: Secondary | ICD-10-CM | POA: Diagnosis not present

## 2019-09-28 DIAGNOSIS — R0683 Snoring: Secondary | ICD-10-CM | POA: Insufficient documentation

## 2019-09-28 DIAGNOSIS — D6869 Other thrombophilia: Secondary | ICD-10-CM

## 2019-09-28 DIAGNOSIS — E785 Hyperlipidemia, unspecified: Secondary | ICD-10-CM | POA: Insufficient documentation

## 2019-09-28 DIAGNOSIS — Z888 Allergy status to other drugs, medicaments and biological substances status: Secondary | ICD-10-CM | POA: Diagnosis not present

## 2019-09-28 DIAGNOSIS — Z79899 Other long term (current) drug therapy: Secondary | ICD-10-CM | POA: Insufficient documentation

## 2019-09-28 DIAGNOSIS — Z8249 Family history of ischemic heart disease and other diseases of the circulatory system: Secondary | ICD-10-CM | POA: Insufficient documentation

## 2019-09-28 DIAGNOSIS — Z882 Allergy status to sulfonamides status: Secondary | ICD-10-CM | POA: Insufficient documentation

## 2019-09-28 HISTORY — DX: Other thrombophilia: D68.69

## 2019-09-28 MED ORDER — METOPROLOL SUCCINATE ER 25 MG PO TB24: 25.0000 mg | ORAL_TABLET | Freq: Every day | ORAL | 3 refills | Status: DC

## 2019-09-28 NOTE — Progress Notes (Signed)
Primary Care Physician: Gerome Sam, MD Primary Cardiologist: none Primary Electrophysiologist: one Referring Physician: [facility]   Joshua [name] is a 73 y.o. male with a history of paroxysmal atrial fibrillation, emphysema, RBBB, aortic atherosclerosis who presents for consultation in the [facility].  The patient was initially diagnosed with atrial fibrillation in 2013 and saw cardiology at [facility]. More recently, he was seen by pulmonology for increased SOB and was found to be in rate controlled afib. His D-dimer was also noted to be elevated and so he was sent to the ER for evaluation. CTA and ultrasounds negative for PE or DVT. Patient was started on Eliquis for a CHADS2VASC score of 2. Patient reports that in hindsight, his SOB has been steadily getting worse for 2-3 months. He does note that his heart rate will jump up to 170s-180 when he walks on the treadmill. He does have occasional palpitations. He admits to snoring and daytime somnolence and has a sleep study pending. He denies significant alcohol use.  Today, he denies symptoms of chest pain, orthopnea, PND, lower extremity edema, dizziness, presyncope, syncope, snoring, daytime somnolence, bleeding, or neurologic sequela. The patient is tolerating medications without difficulties and is otherwise without complaint today.    Atrial Fibrillation Risk Factors:  he does have symptoms or diagnosis of sleep apnea. Sleep study is pending per pulmonology. he does not have a history of rheumatic fever. he does not have a history of alcohol use. The patient does not have a history of early familial atrial fibrillation or other arrhythmias.  he has a BMI of Body mass index is 34.78 kg/m.Marland Kitchen Filed Weights   09/28/19 0845  Weight: 113.1 kg    Family History  Problem Relation Age of Onset  . Varicose Veins Mother   . Stroke Father      Atrial Fibrillation Management history:   Previous antiarrhythmic drugs: none Previous cardioversions: none Previous ablations: none CHADS2VASC score: 2 Anticoagulation history: Eliquis   Past Medical History:  Diagnosis Date  . Hyperlipidemia   . Kidney stone    Past Surgical History:  Procedure Laterality Date  . HEMORRHOID SURGERY      Current Outpatient Medications  Medication Sig Dispense Refill  . apixaban (ELIQUIS) 5 MG TABS tablet Take 1 tablet (5 mg total) by mouth 2 (two) times daily. 60 tablet 0  . divalproex (DEPAKOTE) 250 MG DR tablet Take 750 mg by mouth at bedtime.     Marland Kitchen doxycycline (VIBRAMYCIN) 100 MG capsule Take 100 mg by mouth as needed. For skin infections    . FLUoxetine (PROZAC) 20 MG capsule     . hydrOXYzine (ATARAX/VISTARIL) 10 MG tablet Take 10 mg by mouth daily as needed.    Marland Kitchen ketoconazole (NIZORAL) 2 % shampoo     . simvastatin (ZOCOR) 40 MG tablet Take 40 mg by mouth daily.    . Tamsulosin HCl (FLOMAX PO) Take by mouth. PRN KIDNEY STONES    . Tiotropium Bromide-Olodaterol (STIOLTO RESPIMAT) 2.5-2.5 MCG/ACT AERS Inhale 2 puffs into the lungs daily. 4 g 0  . metoprolol succinate (TOPROL XL) 25 MG 24 hr tablet Take 1 tablet (25 mg total) by mouth daily. 30 tablet 3   No current facility-administered medications for this encounter.     Allergies  Allergen Reactions  . Venlafaxine Palpitations  . Bupropion   . Zoloft  [Sertraline Hcl]     Social History   Socioeconomic History  . Marital status: Married  Spouse name: Not on file  . Number of children: Not on file  . Years of education: Not on file  . Highest education level: Not on file  Occupational History  . Not on file  Social Needs  . Financial resource strain: Not on file  . Food insecurity    Worry: Not on file    Inability: Not on file  . Transportation needs    Medical: Not on file    Non-medical: Not on file  Tobacco Use  . Smoking status: Former Smoker    Packs/day: 2.00    Years: 46.00    Pack years: 92.00     Quit date: 2010    Years since quitting: 10.9  . Smokeless tobacco: Never Used  Substance and Sexual Activity  . Alcohol use: Yes    Alcohol/week: 1.0 - 2.0 standard drinks    Types: 1 - 2 Cans of beer per week    Comment: social  . Drug use: No  . Sexual activity: Not on file  Lifestyle  . Physical activity    Days per week: Not on file    Minutes per session: Not on file  . Stress: Not on file  Relationships  . Social Herbalist on phone: Not on file    Gets together: Not on file    Attends religious service: Not on file    Active member of club or organization: Not on file    Attends meetings of clubs or organizations: Not on file    Relationship status: Not on file  . Intimate partner violence    Fear of current or ex partner: Not on file    Emotionally abused: Not on file    Physically abused: Not on file    Forced sexual activity: Not on file  Other Topics Concern  . Not on file  Social History Narrative  . Not on file     ROS- All systems are reviewed and negative except as per the HPI above.  Physical Exam: Vitals:   09/28/19 0845  BP: 112/78  Pulse: 96  Weight: 113.1 kg  Height: 5\' 11"  (1.803 m)    GEN- The patient is well appearing obese male, alert and oriented x 3 today.   Head- normocephalic, atraumatic Eyes-  Sclera clear, conjunctiva pink Ears- hearing intact Oropharynx- clear Neck- supple  Lungs- Clear to ausculation bilaterally, normal work of breathing Heart- irregular rate and rhythm, no murmurs, rubs or gallops  GI- soft, NT, ND, + BS Extremities- no clubbing, cyanosis, or edema MS- no significant deformity or atrophy Skin- no rash or lesion Psych- euthymic mood, full affect Neuro- strength and sensation are intact  Wt Readings from Last 3 Encounters:  09/28/19 113.1 kg  09/23/19 112.9 kg  09/22/19 113.9 kg    EKG today demonstrates afib HR 96, RBBB, QRS 150, QTc 467  Epic records are reviewed at length today   Assessment and Plan:  1. Persistent atrial fibrillation Patient has a history of paroxysmal afib which now appears persistent.  General education about afib provided and questions answered. We also discussed his stroke risk and the risks and benefits of anticoagulation.  Echocardiogram pending. Recent labs reviewed. We discussed therapeutic options today. Will plan for DCCV after 3 weeks of uninterrupted anticoagulation (after 12/10).  Will start Toprol 25 mg daily for rate control. Lifestyle changes as below. Will stop ASA given he is on Eliquis.  This patients CHA2DS2-VASc Score and unadjusted Ischemic Stroke  Rate (% per year) is equal to 2.2 % stroke rate/year from a score of 2  Above score calculated as 1 point each if present [CHF, HTN, DM, Vascular=MI/PAD/Aortic Plaque, Age if 65-74, or Male] Above score calculated as 2 points each if present [Age > 75, or Stroke/TIA/TE]   2. Obesity Body mass index is 34.78 kg/m. Lifestyle modification was discussed at length including regular exercise and weight reduction.  3. Snoring The importance of adequate treatment of sleep apnea was discussed today in order to improve our ability to maintain sinus rhythm long term.  Home sleep study per pulmonology.   4. CAD Coronary artery calcification seen on chest CT. Patient denies chest pain. He is on simvastatin.  ASA stopped 2/2 need for anticoagulation. Will plan to refer to general cardiology at follow up.   Follow up in the AF clinic one week post DCCV.   [facility] [address] [place], [place] [address] [phone] 09/28/2019 9:26 AM

## 2019-09-28 NOTE — Patient Instructions (Addendum)
Start Metoprolol 25mg  daily, stop Aspirin and monitor blood pressure readings daily.  Cardioversion scheduled for December 11th Come to Afib clinic at 9:30am for labs.  - Arrive at the [facility] and go to admitting at 10:00am  -Do not eat or drink anything after midnight the night prior to your procedure.  - Take all your medication with a sip of water prior to arrival.  - You will not be able to drive home after your procedure.

## 2019-09-29 ENCOUNTER — Ambulatory Visit (INDEPENDENT_AMBULATORY_CARE_PROVIDER_SITE_OTHER): Payer: Medicare HMO | Admitting: Pulmonary Disease

## 2019-09-29 ENCOUNTER — Encounter: Payer: Self-pay | Admitting: Pulmonary Disease

## 2019-09-29 DIAGNOSIS — J432 Centrilobular emphysema: Secondary | ICD-10-CM | POA: Diagnosis not present

## 2019-09-29 DIAGNOSIS — I4891 Unspecified atrial fibrillation: Secondary | ICD-10-CM | POA: Diagnosis not present

## 2019-09-29 NOTE — Progress Notes (Signed)
Virtual Visit via Telephone Note  I connected with Joshua [name] on 09/29/19 at  9:45 AM EST by telephone and verified that I am speaking with the correct person using two identifiers.  Location: Patient: Joshua [name]  Provider: Garner Nash, DO    I discussed the limitations, risks, security and privacy concerns of performing an evaluation and management service by telephone and the availability of in person appointments. I also discussed with the patient that there may be a patient responsible charge related to this service. The patient expressed understanding and agreed to proceed.   History of Present Illness:  73 year old gentleman here for follow-up.  Was recently sent to the emergency room after having an elevated D-dimer.  CTA of the chest negative for PE.  Lower extremity Dopplers negative for DVT.  Does have a 90-pack-year history of smoking.  Pulmonary function test which were relatively normal, normal FVC/FEV1, have a mildly reduced DLCO 73%.  Currently on Spiriva.  He was also found to have new onset atrial fibrillation.  Has been seen in the atrial fibrillation clinic which has recommended 3 weeks of anticoagulation with Eliquis with plans for DC cardioversion.  At this point breathing is stable.  She has no complaints today.   Observations/Objective: Nonlabored breathing  Assessment and Plan:  Emphysema-continue Spiriva Atrial fibrillation-continue anticoagulation with plans for DC cardioversion per cardiology  Follow Up Instructions: Follow-up in 6 months or as needed onset based on symptoms.   I discussed the assessment and treatment plan with the patient. The patient was provided an opportunity to ask questions and all were answered. The patient agreed with the plan and demonstrated an understanding of the instructions.   The patient was advised to call back or seek an in-person evaluation if the symptoms worsen or if the condition fails to improve as  anticipated.  I provided 15 minutes of non-face-to-face time during this encounter.   Garner Nash, DO

## 2019-10-01 ENCOUNTER — Telehealth: Payer: Self-pay | Admitting: Pulmonary Disease

## 2019-10-01 NOTE — Telephone Encounter (Signed)
10/01/2019 1151  PCC's,  Can we get the patient scheduled for home sleep study?  He has now been discharged from the hospital and is at home.  Wyn Quaker, FNP

## 2019-10-01 NOTE — Telephone Encounter (Signed)
Printed & placed in folder to be scheduled.  

## 2019-10-03 NOTE — Progress Notes (Signed)
PCCM: thanks for seeing him  Garner Nash, DO [facility] 10/03/2019 6:41 PM

## 2019-10-05 ENCOUNTER — Other Ambulatory Visit: Payer: Self-pay

## 2019-10-05 ENCOUNTER — Ambulatory Visit (HOSPITAL_COMMUNITY): Payer: Medicare HMO | Attending: Cardiovascular Disease

## 2019-10-05 DIAGNOSIS — R0602 Shortness of breath: Secondary | ICD-10-CM

## 2019-10-05 DIAGNOSIS — I4891 Unspecified atrial fibrillation: Secondary | ICD-10-CM | POA: Diagnosis not present

## 2019-10-06 NOTE — Progress Notes (Signed)
Stable ejection fraction.  Ejection fraction 65 to 70%.  This is good news.  It was difficult to fully assess your heart as you were still in A. fib.  Continue follow-up with A. fib clinic.    No further recommendations or changes based off of this echocardiogram.  This can be further reviewed at office visits with A. fib clinic as well as at next office visit with our office visit.  Wyn Quaker FNP

## 2019-10-06 NOTE — H&P (View-Only) (Signed)
Stable ejection fraction.  Ejection fraction 65 to 70%.  This is good news.  It was difficult to fully assess your heart as you were still in A. fib.  Continue follow-up with A. fib clinic.    No further recommendations or changes based off of this echocardiogram.  This can be further reviewed at office visits with A. fib clinic as well as at next office visit with our office visit.  Wyn Quaker FNP

## 2019-10-07 ENCOUNTER — Telehealth: Payer: Self-pay | Admitting: Pulmonary Disease

## 2019-10-07 NOTE — Telephone Encounter (Signed)
Patient is returning phone call.  Patient phone number is [phone].

## 2019-10-07 NOTE — Telephone Encounter (Signed)
Stable ejection fraction. Ejection fraction 65 to 70%. This is good news. It was difficult to fully assess your heart as you were still in A. fib. Continue follow-up with A. fib clinic.    No further recommendations or changes based off of this echocardiogram. This can be further reviewed at office visits with A. fib clinic as well as at next office visit with our office visit.   Attempted to call pt to let him know of the above echo results but unable to reach. Left message for pt to return call.

## 2019-10-07 NOTE — Telephone Encounter (Signed)
Spoke with pt and notified of results per Brian. Pt verbalized understanding and denied any questions. °

## 2019-10-08 NOTE — Progress Notes (Signed)
Pt notified of results on 10/07/2019- see phone note

## 2019-10-12 ENCOUNTER — Other Ambulatory Visit (HOSPITAL_COMMUNITY)
Admission: RE | Admit: 2019-10-12 | Discharge: 2019-10-12 | Disposition: A | Discharge status: Home / Self Care | Payer: Medicare HMO | Source: Ambulatory Visit | Attending: Cardiology | Admitting: Cardiology

## 2019-10-12 DIAGNOSIS — Z01812 Encounter for preprocedural laboratory examination: Secondary | ICD-10-CM | POA: Insufficient documentation

## 2019-10-12 DIAGNOSIS — Z20828 Contact with and (suspected) exposure to other viral communicable diseases: Secondary | ICD-10-CM | POA: Insufficient documentation

## 2019-10-13 LAB — NOVEL CORONAVIRUS, NAA (HOSP ORDER, SEND-OUT TO REF LAB; TAT 18-24 HRS): SARS-CoV-2, NAA: NOT DETECTED

## 2019-10-15 ENCOUNTER — Ambulatory Visit (HOSPITAL_COMMUNITY): Payer: Medicare HMO | Admitting: Anesthesiology

## 2019-10-15 ENCOUNTER — Other Ambulatory Visit: Payer: Self-pay

## 2019-10-15 ENCOUNTER — Ambulatory Visit (HOSPITAL_COMMUNITY)
Admission: RE | Admit: 2019-10-15 | Discharge: 2019-10-15 | Disposition: A | Discharge status: Home / Self Care | Payer: Medicare HMO | Source: Ambulatory Visit | Attending: Physician Assistant | Admitting: Physician Assistant

## 2019-10-15 ENCOUNTER — Encounter (HOSPITAL_COMMUNITY): Payer: Self-pay | Admitting: Cardiology

## 2019-10-15 ENCOUNTER — Ambulatory Visit (HOSPITAL_COMMUNITY)
Admission: RE | Admit: 2019-10-15 | Discharge: 2019-10-15 | Disposition: A | Discharge status: Home / Self Care | Payer: Medicare HMO | Attending: Cardiology | Admitting: Cardiology

## 2019-10-15 ENCOUNTER — Encounter (HOSPITAL_COMMUNITY)
Admission: RE | Disposition: A | Discharge status: Home / Self Care | Payer: Self-pay | Source: Home / Self Care | Attending: Cardiology

## 2019-10-15 DIAGNOSIS — Z87891 Personal history of nicotine dependence: Secondary | ICD-10-CM | POA: Diagnosis not present

## 2019-10-15 DIAGNOSIS — J439 Emphysema, unspecified: Secondary | ICD-10-CM | POA: Diagnosis not present

## 2019-10-15 DIAGNOSIS — J449 Chronic obstructive pulmonary disease, unspecified: Secondary | ICD-10-CM | POA: Diagnosis not present

## 2019-10-15 DIAGNOSIS — Z7901 Long term (current) use of anticoagulants: Secondary | ICD-10-CM | POA: Insufficient documentation

## 2019-10-15 DIAGNOSIS — I4891 Unspecified atrial fibrillation: Secondary | ICD-10-CM | POA: Diagnosis not present

## 2019-10-15 DIAGNOSIS — Z79899 Other long term (current) drug therapy: Secondary | ICD-10-CM | POA: Insufficient documentation

## 2019-10-15 DIAGNOSIS — I4510 Unspecified right bundle-branch block: Secondary | ICD-10-CM | POA: Insufficient documentation

## 2019-10-15 DIAGNOSIS — I2510 Atherosclerotic heart disease of native coronary artery without angina pectoris: Secondary | ICD-10-CM | POA: Diagnosis not present

## 2019-10-15 DIAGNOSIS — I480 Paroxysmal atrial fibrillation: Secondary | ICD-10-CM | POA: Diagnosis not present

## 2019-10-15 DIAGNOSIS — R0683 Snoring: Secondary | ICD-10-CM | POA: Diagnosis not present

## 2019-10-15 DIAGNOSIS — E7849 Other hyperlipidemia: Secondary | ICD-10-CM | POA: Diagnosis not present

## 2019-10-15 DIAGNOSIS — E669 Obesity, unspecified: Secondary | ICD-10-CM | POA: Diagnosis not present

## 2019-10-15 DIAGNOSIS — Z6834 Body mass index (BMI) 34.0-34.9, adult: Secondary | ICD-10-CM | POA: Insufficient documentation

## 2019-10-15 DIAGNOSIS — I700 Atherosclerosis of aorta: Secondary | ICD-10-CM | POA: Diagnosis not present

## 2019-10-15 DIAGNOSIS — E785 Hyperlipidemia, unspecified: Secondary | ICD-10-CM | POA: Insufficient documentation

## 2019-10-15 HISTORY — PX: CARDIOVERSION: SHX1299

## 2019-10-15 LAB — CBC
HCT: 45.4 % (ref 39.0–52.0)
Hemoglobin: 15.3 g/dL (ref 13.0–17.0)
MCH: 34.2 pg — ABNORMAL HIGH (ref 26.0–34.0)
MCHC: 33.7 g/dL (ref 30.0–36.0)
MCV: 101.3 fL — ABNORMAL HIGH (ref 80.0–100.0)
Platelets: 190 10*3/uL (ref 150–400)
RBC: 4.48 MIL/uL (ref 4.22–5.81)
RDW: 12.7 % (ref 11.5–15.5)
WBC: 3.6 10*3/uL — ABNORMAL LOW (ref 4.0–10.5)
nRBC: 0 % (ref 0.0–0.2)

## 2019-10-15 LAB — BASIC METABOLIC PANEL
Anion gap: 11 (ref 5–15)
BUN: 20 mg/dL (ref 8–23)
CO2: 23 mmol/L (ref 22–32)
Calcium: 8.6 mg/dL — ABNORMAL LOW (ref 8.9–10.3)
Chloride: 110 mmol/L (ref 98–111)
Creatinine, Ser: 1.36 mg/dL — ABNORMAL HIGH (ref 0.61–1.24)
GFR calc Af Amer: >60 mL/min (ref >60)
GFR calc non Af Amer: 52 mL/min — ABNORMAL LOW (ref >60)
Glucose, Bld: 104 mg/dL — ABNORMAL HIGH (ref 70–99)
Potassium: 4.1 mmol/L (ref 3.5–5.1)
Sodium: 144 mmol/L (ref 135–145)

## 2019-10-15 SURGERY — CARDIOVERSION
Anesthesia: General

## 2019-10-15 MED ORDER — SODIUM CHLORIDE 0.9 % IV SOLN
INTRAVENOUS | Status: AC | PRN
  Administered 2019-10-15: 500 mL via INTRAMUSCULAR

## 2019-10-15 MED ORDER — SODIUM CHLORIDE 0.9 % IV SOLN: INTRAVENOUS | Status: DC

## 2019-10-15 MED ORDER — PROPOFOL 10 MG/ML IV BOLUS
INTRAVENOUS | Status: DC | PRN
  Administered 2019-10-15: 60 mg via INTRAVENOUS

## 2019-10-15 MED ORDER — LIDOCAINE HCL (CARDIAC) PF 100 MG/5ML IV SOSY
PREFILLED_SYRINGE | INTRAVENOUS | Status: DC | PRN
  Administered 2019-10-15: 100 mg via INTRATRACHEAL

## 2019-10-15 NOTE — Transfer of Care (Signed)
Immediate Anesthesia Transfer of Care Note  Patient: Joshua [name]  Procedure(s) Performed: CARDIOVERSION (N/A )  Patient Location: Endoscopy Unit  Anesthesia Type:MAC  Level of Consciousness: awake, alert  and oriented  Airway & Oxygen Therapy: Patient connected to nasal cannula oxygen  Post-op Assessment: Post -op Vital signs reviewed and stable  Post vital signs: stable  Last Vitals:  Vitals Value Taken Time  BP    Temp    Pulse    Resp    SpO2      Last Pain:  Vitals:   10/15/19 1028  TempSrc: Oral  PainSc: 0-No pain         Complications: No apparent anesthesia complications

## 2019-10-15 NOTE — CV Procedure (Signed)
    Electrical Cardioversion Procedure Note Dray Lerew Medel III [id number] [birth year]  Procedure: Electrical Cardioversion Indications:  Atrial Fibrillation  Time Out: Verified patient identification, verified procedure,medications/allergies/relevent history reviewed, required imaging and test results available.  Performed  Procedure Details  The patient was NPO after midnight. Anesthesia was administered at the beside  by Dr.Witman with propofol.  Cardioversion was performed with synchronized biphasic defibrillation via AP pads with 120, 200, 200 joules.  3 attempt(s) were performed.  The patient did NOT convert to normal sinus rhythm. The patient tolerated the procedure well   IMPRESSION:  Unsuccessful cardioversion of atrial fibrillation   He maintains well rate controlled AFIB.   Joshua [name] 10/15/2019, 11:12 AM

## 2019-10-15 NOTE — Anesthesia Procedure Notes (Signed)
Procedure Name: MAC Date/Time: 10/15/2019 10:40 AM Performed by: Lavell Luster, CRNA Pre-anesthesia Checklist: Patient identified, Emergency Drugs available, Suction available, Patient being monitored and Timeout performed Patient Re-evaluated:Patient Re-evaluated prior to induction Oxygen Delivery Method: Ambu bag Preoxygenation: Pre-oxygenation with 100% oxygen Induction Type: IV induction Ventilation: Mask ventilation without difficulty Placement Confirmation: breath sounds checked- equal and bilateral and positive ETCO2 Dental Injury: Teeth and Oropharynx as per pre-operative assessment

## 2019-10-15 NOTE — Anesthesia Preprocedure Evaluation (Signed)
Anesthesia Evaluation  Patient identified by MRN, date of birth, ID band Patient awake    Reviewed: Allergy & Precautions, NPO status , Patient's Chart, lab work & pertinent test results  History of Anesthesia Complications Negative for: history of anesthetic complications  Airway Mallampati: II  TM Distance: >3 FB Neck ROM: Full    Dental   Pulmonary COPD, former smoker,    Pulmonary exam normal        Cardiovascular + dysrhythmias Atrial Fibrillation  Rhythm:Irregular Rate:Normal     Neuro/Psych negative neurological ROS  negative psych ROS   GI/Hepatic negative GI ROS, Neg liver ROS,   Endo/Other  negative endocrine ROS  Renal/GU negative Renal ROS  negative genitourinary   Musculoskeletal negative musculoskeletal ROS (+)   Abdominal   Peds  Hematology negative hematology ROS (+)   Anesthesia Other Findings   Reproductive/Obstetrics                             Anesthesia Physical Anesthesia Plan  ASA: III  Anesthesia Plan: General   Post-op Pain Management:    Induction: Intravenous  PONV Risk Score and Plan: TIVA and Treatment may vary due to age or medical condition  Airway Management Planned: Mask  Additional Equipment: None  Intra-op Plan:   Post-operative Plan:   Informed Consent: I have reviewed the patients History and Physical, chart, labs and discussed the procedure including the risks, benefits and alternatives for the proposed anesthesia with the patient or authorized representative who has indicated his/her understanding and acceptance.       Plan Discussed with:   Anesthesia Plan Comments:         Anesthesia Quick Evaluation

## 2019-10-15 NOTE — Interval H&P Note (Signed)
History and Physical Interval Note:  10/15/2019 10:15 AM  Joshua [name]  has presented today for surgery, with the diagnosis of ATRIAL FIB.  The various methods of treatment have been discussed with the patient and family. After consideration of risks, benefits and other options for treatment, the patient has consented to  Procedure(s): CARDIOVERSION (N/A) as a surgical intervention.  The patient's history has been reviewed, patient examined, no change in status, stable for surgery.  I have reviewed the patient's chart and labs.  Questions were answered to the patient's satisfaction.     [employer]

## 2019-10-15 NOTE — Anesthesia Postprocedure Evaluation (Signed)
Anesthesia Post Note  Patient: Joshua [name]  Procedure(s) Performed: CARDIOVERSION (N/A )     Patient location during evaluation: Endoscopy Anesthesia Type: General Level of consciousness: awake and alert Pain management: pain level controlled Vital Signs Assessment: post-procedure vital signs reviewed and stable Respiratory status: spontaneous breathing, nonlabored ventilation and respiratory function stable Cardiovascular status: blood pressure returned to baseline and stable Postop Assessment: no apparent nausea or vomiting Anesthetic complications: no    Last Vitals:  Vitals:   10/15/19 1127 10/15/19 1133  BP: 102/83 115/74  Pulse: 62 96  Resp: 20   Temp:    SpO2: 94% 95%    Last Pain:  Vitals:   10/15/19 1115  TempSrc:   PainSc: 0-No pain                 Lidia Collum

## 2019-10-22 ENCOUNTER — Other Ambulatory Visit: Payer: Self-pay

## 2019-10-22 ENCOUNTER — Encounter (HOSPITAL_COMMUNITY): Payer: Self-pay | Admitting: Physician Assistant

## 2019-10-22 ENCOUNTER — Ambulatory Visit (HOSPITAL_COMMUNITY)
Admission: RE | Admit: 2019-10-22 | Discharge: 2019-10-22 | Disposition: A | Discharge status: Home / Self Care | Payer: Medicare HMO | Source: Ambulatory Visit | Attending: Physician Assistant | Admitting: Physician Assistant

## 2019-10-22 VITALS — BP 110/74 | HR 77 | Ht 71.0 in | Wt 248.2 lb

## 2019-10-22 DIAGNOSIS — I4510 Unspecified right bundle-branch block: Secondary | ICD-10-CM | POA: Insufficient documentation

## 2019-10-22 DIAGNOSIS — R0683 Snoring: Secondary | ICD-10-CM | POA: Diagnosis not present

## 2019-10-22 DIAGNOSIS — J439 Emphysema, unspecified: Secondary | ICD-10-CM | POA: Insufficient documentation

## 2019-10-22 DIAGNOSIS — E669 Obesity, unspecified: Secondary | ICD-10-CM | POA: Insufficient documentation

## 2019-10-22 DIAGNOSIS — I700 Atherosclerosis of aorta: Secondary | ICD-10-CM | POA: Insufficient documentation

## 2019-10-22 DIAGNOSIS — Z79899 Other long term (current) drug therapy: Secondary | ICD-10-CM | POA: Diagnosis not present

## 2019-10-22 DIAGNOSIS — Z7901 Long term (current) use of anticoagulants: Secondary | ICD-10-CM | POA: Insufficient documentation

## 2019-10-22 DIAGNOSIS — D6869 Other thrombophilia: Secondary | ICD-10-CM

## 2019-10-22 DIAGNOSIS — I4819 Other persistent atrial fibrillation: Secondary | ICD-10-CM | POA: Diagnosis not present

## 2019-10-22 DIAGNOSIS — Z6834 Body mass index (BMI) 34.0-34.9, adult: Secondary | ICD-10-CM | POA: Diagnosis not present

## 2019-10-22 DIAGNOSIS — E785 Hyperlipidemia, unspecified: Secondary | ICD-10-CM | POA: Insufficient documentation

## 2019-10-22 MED ORDER — APIXABAN 5 MG PO TABS: 5.0000 mg | ORAL_TABLET | Freq: Two times a day (BID) | ORAL | 4 refills | Status: DC

## 2019-10-22 MED ORDER — ATORVASTATIN CALCIUM 20 MG PO TABS: 20.0000 mg | ORAL_TABLET | Freq: Every day | ORAL | 2 refills | Status: DC

## 2019-10-22 MED ORDER — AMIODARONE HCL 200 MG PO TABS: ORAL_TABLET | ORAL | 0 refills | Status: DC

## 2019-10-22 NOTE — Progress Notes (Signed)
Primary Care Physician: Gregor Hams, MD Primary Cardiologist: none Primary Electrophysiologist: one Referring Physician: [facility]   Joshua [name] is a 73 y.o. male with a history of paroxysmal atrial fibrillation, emphysema, RBBB, aortic atherosclerosis who presents for consultation in the [facility].  The patient was initially diagnosed with atrial fibrillation in 2013 and saw cardiology at [facility]. More recently, he was seen by pulmonology for increased SOB and was found to be in rate controlled afib. His D-dimer was also noted to be elevated and so he was sent to the ER for evaluation. CTA and ultrasounds negative for PE or DVT. Patient was started on Eliquis for a CHADS2VASC score of 2. Patient reports that in hindsight, his SOB has been steadily getting worse for 2-3 months. He does note that his heart rate will jump up to 170s-180 when he walks on the treadmill. He does have occasional palpitations. He admits to snoring and daytime somnolence and has a sleep study pending. He denies significant alcohol use.  On follow up today, patient is s/p unsuccessful DCCV on 10/15/19. Patient reports that he continues to have dyspnea with minimal exertion. Echo showed preserved EF with severely dilated LA. His home BP log shows excellent control of his BP and heart rate. He denies symptoms of fluid overload.  Today, he denies symptoms of chest pain, orthopnea, PND, lower extremity edema, dizziness, presyncope, syncope, snoring, daytime somnolence, bleeding, or neurologic sequela. The patient is tolerating medications without difficulties and is otherwise without complaint today.    Atrial Fibrillation Risk Factors:  he does have symptoms or diagnosis of sleep apnea. Sleep study is pending per pulmonology. he does not have a history of rheumatic fever. he does not have a history of alcohol use. The patient does not have a history of early familial atrial  fibrillation or other arrhythmias.  he has a BMI of Body mass index is 34.62 kg/m.Marland Kitchen Filed Weights   10/22/19 0945  Weight: 112.6 kg    Family History  Problem Relation Age of Onset  . Varicose Veins Mother   . Stroke Father      Atrial Fibrillation Management history:  Previous antiarrhythmic drugs: none Previous cardioversions: 10/15/19 Previous ablations: none CHADS2VASC score: 2 Anticoagulation history: Eliquis   Past Medical History:  Diagnosis Date  . Hyperlipidemia   . Kidney stone    Past Surgical History:  Procedure Laterality Date  . CARDIOVERSION N/A 10/15/2019   Procedure: CARDIOVERSION;  Surgeon: Jerline Pain, MD;  Location: [facility] ENDOSCOPY;  Service: Cardiovascular;  Laterality: N/A;  . HEMORRHOID SURGERY      Current Outpatient Medications  Medication Sig Dispense Refill  . apixaban (ELIQUIS) 5 MG TABS tablet Take 1 tablet (5 mg total) by mouth 2 (two) times daily. 60 tablet 4  . divalproex (DEPAKOTE) 250 MG DR tablet Take 750 mg by mouth at bedtime.     Marland Kitchen FLUoxetine (PROZAC) 20 MG capsule Take 20 mg by mouth daily.     . hydrOXYzine (ATARAX/VISTARIL) 10 MG tablet Take 10 mg by mouth daily.     Marland Kitchen ketoconazole (NIZORAL) 2 % shampoo Apply 1 application topically 2 (two) times a week.     . metoprolol succinate (TOPROL XL) 25 MG 24 hr tablet Take 1 tablet (25 mg total) by mouth daily. 30 tablet 3  . tamsulosin (FLOMAX) 0.4 MG CAPS capsule Take 0.4 mg by mouth as needed (PRN KIDNEY STONES).     . Tiotropium  Bromide-Olodaterol (STIOLTO RESPIMAT) 2.5-2.5 MCG/ACT AERS Inhale 2 puffs into the lungs daily. 4 g 0  . amiodarone (PACERONE) 200 MG tablet Take 200mg  twice daily for one month and decrease to 1 tablet daily 60 tablet 0  . atorvastatin (LIPITOR) 20 MG tablet Take 1 tablet (20 mg total) by mouth daily. 30 tablet 2   No current facility-administered medications for this encounter.    Allergies  Allergen Reactions  . Venlafaxine Palpitations  .  Bupropion Other (See Comments)    unknown  . Zoloft [Sertraline Hcl] Other (See Comments)    Tick    Social History   Socioeconomic History  . Marital status: Married    Spouse name: Not on file  . Number of children: Not on file  . Years of education: Not on file  . Highest education level: Not on file  Occupational History  . Not on file  Tobacco Use  . Smoking status: Former Smoker    Packs/day: 2.00    Years: 46.00    Pack years: 92.00    Quit date: 2010    Years since quitting: 10.9  . Smokeless tobacco: Never Used  Substance and Sexual Activity  . Alcohol use: Not Currently    Alcohol/week: 1.0 - 2.0 standard drinks    Types: 1 - 2 Cans of beer per week    Comment: social  . Drug use: No  . Sexual activity: Not on file  Other Topics Concern  . Not on file  Social History Narrative  . Not on file   Social Determinants of Health   Financial Resource Strain:   . Difficulty of Paying Living Expenses: Not on file  Food Insecurity:   . Worried About Charity fundraiser in the Last Year: Not on file  . Ran Out of Food in the Last Year: Not on file  Transportation Needs:   . Lack of Transportation (Medical): Not on file  . Lack of Transportation (Non-Medical): Not on file  Physical Activity:   . Days of Exercise per Week: Not on file  . Minutes of Exercise per Session: Not on file  Stress:   . Feeling of Stress : Not on file  Social Connections:   . Frequency of Communication with Friends and Family: Not on file  . Frequency of Social Gatherings with Friends and Family: Not on file  . Attends Religious Services: Not on file  . Active Member of Clubs or Organizations: Not on file  . Attends Archivist Meetings: Not on file  . Marital Status: Not on file  Intimate Partner Violence:   . Fear of Current or Ex-Partner: Not on file  . Emotionally Abused: Not on file  . Physically Abused: Not on file  . Sexually Abused: Not on file     ROS- All  systems are reviewed and negative except as per the HPI above.  Physical Exam: Vitals:   10/22/19 0945  BP: 110/74  Pulse: 77  Weight: 112.6 kg  Height: 5\' 11"  (1.803 m)    GEN- The patient is well appearing obese male, alert and oriented x 3 today.   HEENT-head normocephalic, atraumatic, sclera clear, conjunctiva pink, hearing intact, trachea midline. Lungs- Clear to ausculation bilaterally, normal work of breathing Heart- irregular rate and rhythm, no murmurs, rubs or gallops  GI- soft, NT, ND, + BS Extremities- no clubbing, cyanosis, or edema MS- no significant deformity or atrophy Skin- no rash or lesion Psych- euthymic mood, full affect Neuro-  strength and sensation are intact   Wt Readings from Last 3 Encounters:  10/22/19 112.6 kg  10/15/19 108.9 kg  09/28/19 113.1 kg    EKG today demonstrates afib HR 77, RBBB, QRS 150, QTc 457  Echo 10/05/19  1. Left ventricular ejection fraction, by visual estimation, is 65 to 70%. The left ventricle has hyperdynamic function. There is no left ventricular hypertrophy.  2. Left ventricular diastolic function could not be evaluated.  3. Global right ventricle has moderately reduced systolic function.The right ventricular size is normal. No increase in right ventricular wall thickness.  4. Left atrial size was severely dilated.  5. Right atrial size was moderately dilated.  6. The mitral valve is normal in structure. Mild mitral valve regurgitation.  7. The tricuspid valve is normal in structure. Tricuspid valve regurgitation is mild.  8. The aortic valve is normal in structure. Aortic valve regurgitation is not visualized. No evidence of aortic valve sclerosis or stenosis.  9. The pulmonic valve was normal in structure. Pulmonic valve regurgitation is not visualized. 10. Aortic dilatation noted. 11. 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 12. Normal pulmonary artery systolic pressure. 13. The atrial  septum is grossly normal.  Epic records are reviewed at length today  Assessment and Plan:  1. Persistent atrial fibrillation S/p unsuccessful DCCV 10/15/19. We discussed therapeutic options today including dofetilide, amiodarone, and rate control. After discussing the risks and benefits, will plan to start amiodarone 200 mg BID for one month then decrease to 200 mg daily. If he remains in afib after loading, will arrange repeat DCCV. Recent Cmet/TSH reviewed.  Continue Toprol 25 mg daily Continue Eliquis 5 mg BID  This patients CHA2DS2-VASc Score and unadjusted Ischemic Stroke Rate (% per year) is equal to 2.2 % stroke rate/year from a score of 2  Above score calculated as 1 point each if present [CHF, HTN, DM, Vascular=MI/PAD/Aortic Plaque, Age if 65-74, or Male] Above score calculated as 2 points each if present [Age > 75, or Stroke/TIA/TE]   2. Obesity Body mass index is 34.62 kg/m. Lifestyle modification was discussed and encouraged including regular physical activity and weight reduction.  3. Snoring The importance of adequate treatment of sleep apnea was discussed today in order to improve our ability to maintain sinus rhythm long term. Sleep study pending per pulmonology.   4. CAD Coronary artery calcification seen on chest CT.  No anginal symptoms. Will change simvastatin to atorvastatin 20 mg daily to accommodate amiodarone.    Follow up in the AF clinic in one month.   [facility] [address] [place], [place] [address] [phone] 10/22/2019 10:36 AM

## 2019-10-22 NOTE — Patient Instructions (Signed)
Start Amiodarone 200mg  twice daily with food for one month and decrease dosage to one tablet daily Stop the Simvastatin medication Start Atorvastatin 20mg  daily

## 2019-11-04 ENCOUNTER — Encounter: Payer: Self-pay | Admitting: Pulmonary Disease

## 2019-11-04 ENCOUNTER — Ambulatory Visit (INDEPENDENT_AMBULATORY_CARE_PROVIDER_SITE_OTHER): Payer: Medicare HMO | Admitting: Pulmonary Disease

## 2019-11-04 ENCOUNTER — Other Ambulatory Visit: Payer: Self-pay

## 2019-11-04 VITALS — BP 108/78 | HR 66 | Temp 97.1°F | Ht 71.0 in | Wt 249.4 lb

## 2019-11-04 DIAGNOSIS — R0609 Other forms of dyspnea: Secondary | ICD-10-CM

## 2019-11-04 DIAGNOSIS — Z87891 Personal history of nicotine dependence: Secondary | ICD-10-CM | POA: Diagnosis not present

## 2019-11-04 DIAGNOSIS — I4821 Permanent atrial fibrillation: Secondary | ICD-10-CM | POA: Diagnosis not present

## 2019-11-04 DIAGNOSIS — Z9189 Other specified personal risk factors, not elsewhere classified: Secondary | ICD-10-CM | POA: Diagnosis not present

## 2019-11-04 DIAGNOSIS — R0600 Dyspnea, unspecified: Secondary | ICD-10-CM

## 2019-11-04 DIAGNOSIS — R0602 Shortness of breath: Secondary | ICD-10-CM

## 2019-11-04 DIAGNOSIS — J432 Centrilobular emphysema: Secondary | ICD-10-CM

## 2019-11-04 NOTE — Progress Notes (Signed)
Synopsis: Referred in October 2020, self-referral for shortness of breath by Gerome Sam*  Subjective:   PATIENT ID: Joshua [name] GENDER: male DOB: 1946-01-08, MRN: [id number]  Chief Complaint  Patient presents with  . Follow-up    Patient states that his breathing is getting worse since last visit. Patient has been without inhalers for about 3 weeks. Patient was given Stiolto last visit but didn't notice a difference.     73 year old gentleman past medical history of hyperlipidemia, nephrolithiasis patient is a longtime smoker, quit smoking at age 75 yo, smoked 45 years, 2 ppd, 90+ pack year history. c/o SOB and DOE. This has been going on several months. It has slowly becoming worse. He tries to walk at a local park. He has been using his inhaler prior to walking or doing yardwork. He gets winded easily. About 20 years ago he had a situation where he was doing work in the yard and had an episode of hemoptysis. He was taken to hospital in [place]. He was in the hospital for about 10 days. And they never found would why he had hemoptysis. Possibly dx with copd in the past. He had office spirometry and was told he may have copd. No FMHx of lung cancer. Grandfather, heavy smoker and had throat cancer.   Patient last seen in our office on 09/23/2019 by Wyn Quaker.  Patient found to be in atrial fibrillation.  With ongoing symptoms and shortness of breath was sent to the emergency room for evaluation.  Started on anticoagulation for his atrial fibrillation.  Patient had subsequent telephone visit with myself on 09/29/2019 continued on Eliquis with plan DC cardioversion by cardiology.  Patient underwent DC cardioversion 10/15/2019 by Dr. Marlou Porch.  OV 11/04/2019: Patient's centrilobular emphysema is currently managed with Stiolto but was started on this recently and not had much improvement with his symptoms., pulmonary function tests with no evidence of obstruction.  Patient underwent  attempt at DC cardioversion for atrial fibrillation however was unsuccessful with 3 attempts.  He has a sleep study that is pending.  Overall from a respiratory standpoint he feels like his symptoms are about the same.  Still has dyspnea on exertion.  He has been working on his weight.  He does understand that he should try to lose weight.   Past Medical History:  Diagnosis Date  . Hyperlipidemia   . Kidney stone      Family History  Problem Relation Age of Onset  . Varicose Veins Mother   . Stroke Father      Past Surgical History:  Procedure Laterality Date  . CARDIOVERSION N/A 10/15/2019   Procedure: CARDIOVERSION;  Surgeon: Jerline Pain, MD;  Location: [facility] ENDOSCOPY;  Service: Cardiovascular;  Laterality: N/A;  . HEMORRHOID SURGERY      Social History   Socioeconomic History  . Marital status: Married    Spouse name: Not on file  . Number of children: Not on file  . Years of education: Not on file  . Highest education level: Not on file  Occupational History  . Not on file  Tobacco Use  . Smoking status: Former Smoker    Packs/day: 2.00    Years: 46.00    Pack years: 92.00    Quit date: 2010    Years since quitting: 11.0  . Smokeless tobacco: Never Used  Substance and Sexual Activity  . Alcohol use: Not Currently    Alcohol/week: 1.0 - 2.0 standard drinks    Types: 1 -  2 Cans of beer per week    Comment: social  . Drug use: No  . Sexual activity: Not on file  Other Topics Concern  . Not on file  Social History Narrative  . Not on file   Social Determinants of Health   Financial Resource Strain:   . Difficulty of Paying Living Expenses: Not on file  Food Insecurity:   . Worried About Charity fundraiser in the Last Year: Not on file  . Ran Out of Food in the Last Year: Not on file  Transportation Needs:   . Lack of Transportation (Medical): Not on file  . Lack of Transportation (Non-Medical): Not on file  Physical Activity:   . Days of Exercise per  Week: Not on file  . Minutes of Exercise per Session: Not on file  Stress:   . Feeling of Stress : Not on file  Social Connections:   . Frequency of Communication with Friends and Family: Not on file  . Frequency of Social Gatherings with Friends and Family: Not on file  . Attends Religious Services: Not on file  . Active Member of Clubs or Organizations: Not on file  . Attends Archivist Meetings: Not on file  . Marital Status: Not on file  Intimate Partner Violence:   . Fear of Current or Ex-Partner: Not on file  . Emotionally Abused: Not on file  . Physically Abused: Not on file  . Sexually Abused: Not on file     Allergies  Allergen Reactions  . Venlafaxine Palpitations  . Bupropion Other (See Comments)    unknown  . Zoloft [Sertraline Hcl] Other (See Comments)    Tick     Outpatient Medications Prior to Visit  Medication Sig Dispense Refill  . amiodarone (PACERONE) 200 MG tablet Take 200mg  twice daily for one month and decrease to 1 tablet daily 60 tablet 0  . apixaban (ELIQUIS) 5 MG TABS tablet Take 1 tablet (5 mg total) by mouth 2 (two) times daily. 60 tablet 4  . atorvastatin (LIPITOR) 20 MG tablet Take 1 tablet (20 mg total) by mouth daily. 30 tablet 2  . divalproex (DEPAKOTE) 250 MG DR tablet Take 750 mg by mouth at bedtime.     Marland Kitchen FLUoxetine (PROZAC) 20 MG capsule Take 20 mg by mouth daily.     . hydrOXYzine (ATARAX/VISTARIL) 10 MG tablet Take 10 mg by mouth daily.     Marland Kitchen ketoconazole (NIZORAL) 2 % shampoo Apply 1 application topically 2 (two) times a week.     . metoprolol succinate (TOPROL XL) 25 MG 24 hr tablet Take 1 tablet (25 mg total) by mouth daily. 30 tablet 3  . tamsulosin (FLOMAX) 0.4 MG CAPS capsule Take 0.4 mg by mouth as needed (PRN KIDNEY STONES).     . Tiotropium Bromide-Olodaterol (STIOLTO RESPIMAT) 2.5-2.5 MCG/ACT AERS Inhale 2 puffs into the lungs daily. (Patient not taking: Reported on 11/04/2019) 4 g 0   No facility-administered  medications prior to visit.    Review of Systems  Constitutional: Negative for chills, fever, malaise/fatigue and weight loss.  HENT: Negative for hearing loss, sore throat and tinnitus.   Eyes: Negative for blurred vision and double vision.  Respiratory: Positive for shortness of breath. Negative for cough, hemoptysis, sputum production, wheezing and stridor.   Cardiovascular: Negative for chest pain, palpitations, orthopnea, leg swelling and PND.  Gastrointestinal: Negative for abdominal pain, constipation, diarrhea, heartburn, nausea and vomiting.  Genitourinary: Negative for dysuria, hematuria and urgency.  Musculoskeletal: Negative for joint pain and myalgias.  Skin: Negative for itching and rash.  Neurological: Negative for dizziness, tingling, weakness and headaches.  Endo/Heme/Allergies: Negative for environmental allergies. Does not bruise/bleed easily.  Psychiatric/Behavioral: Negative for depression. The patient is not nervous/anxious and does not have insomnia.   All other systems reviewed and are negative.    Objective:  Physical Exam Vitals reviewed.  Constitutional:      General: He is not in acute distress.    Appearance: He is well-developed. He is obese.  HENT:     Head: Normocephalic and atraumatic.  Eyes:     General: No scleral icterus.    Conjunctiva/sclera: Conjunctivae normal.     Pupils: Pupils are equal, round, and reactive to light.  Neck:     Vascular: No JVD.     Trachea: No tracheal deviation.  Cardiovascular:     Rate and Rhythm: Normal rate and regular rhythm.     Heart sounds: Normal heart sounds. No murmur.  Pulmonary:     Effort: Pulmonary effort is normal. No tachypnea, accessory muscle usage or respiratory distress.     Breath sounds: Normal breath sounds. No stridor. No wheezing, rhonchi or rales.  Abdominal:     General: Bowel sounds are normal. There is no distension.     Palpations: Abdomen is soft.     Tenderness: There is no  abdominal tenderness.  Musculoskeletal:        General: No tenderness.     Cervical back: Neck supple.  Lymphadenopathy:     Cervical: No cervical adenopathy.  Skin:    General: Skin is warm and dry.     Capillary Refill: Capillary refill takes less than 2 seconds.     Findings: No rash.  Neurological:     Mental Status: He is alert and oriented to person, place, and time.  Psychiatric:        Behavior: Behavior normal.      Vitals:   11/04/19 0952  BP: 108/78  Pulse: 66  Temp: (!) 97.1 F (36.2 C)  TempSrc: Temporal  SpO2: 96%  Weight: 249 lb 6.4 oz (113.1 kg)  Height: 5\' 11"  (1.803 m)   96% on RA BMI Readings from Last 3 Encounters:  11/04/19 34.78 kg/m  10/22/19 34.62 kg/m  10/15/19 33.47 kg/m   Wt Readings from Last 3 Encounters:  11/04/19 249 lb 6.4 oz (113.1 kg)  10/22/19 248 lb 3.2 oz (112.6 kg)  10/15/19 240 lb (108.9 kg)     CBC    Component Value Date/Time   WBC 3.6 (L) 10/15/2019 0959   RBC 4.48 10/15/2019 0959   HGB 15.3 10/15/2019 0959   HCT 45.4 10/15/2019 0959   PLT 190 10/15/2019 0959   MCV 101.3 (H) 10/15/2019 0959   MCH 34.2 (H) 10/15/2019 0959   MCHC 33.7 10/15/2019 0959   RDW 12.7 10/15/2019 0959   LYMPHSABS 1.2 09/23/2019 1113   MONOABS 0.9 09/23/2019 1113   EOSABS 0.1 09/23/2019 1113   BASOSABS 0.0 09/23/2019 1113     Chest Imaging: No previous chest imaging  Pulmonary Functions Testing Results: PFT Results Latest Ref Rng & Units 09/23/2019  FVC-Pre L 3.74  FVC-Predicted Pre % 81  FVC-Post L 3.58  FVC-Predicted Post % 78  Pre FEV1/FVC % % 76  Post FEV1/FCV % % 80  FEV1-Pre L 2.84  FEV1-Predicted Pre % 84  FEV1-Post L 2.88  DLCO UNC% % 73  DLCO COR %Predicted % 83  TLC L 5.96  TLC % Predicted % 82  RV % Predicted % 82    FeNO: None  Pathology: None   Echocardiogram: None   Heart Catheterization: None     Assessment & Plan:     ICD-10-CM   1. Shortness of breath  R06.02   2. Centrilobular emphysema  ([place])  J43.2   3. Former smoker  Z87.891   4. At risk for sleep apnea  Z91.89   5. Permanent atrial fibrillation (HCC)  I48.21   6. DOE (dyspnea on exertion)  R06.00     Discussion:  This is a 73 year old gentleman longstanding history of smoking 90-pack-year history, 2 packs a day for 45 years.  Surprisingly has relatively normal lung function based on pulmonary function test.  No significant obstruction.  Does have mild emphysema noted on CT imaging.  I suspect a portion of his dyspnea on exertion and shortness of breath is related to his obesity as well as atrial fibrillation.  He has follow-up with cardiology already scheduled for a attempt at repeat cardioversion.  Plan: I do not think that he needs to be on an inhaler at this time as it is not making any difference in his symptoms and he does not have any significant obstruction on PFTs.  He may very well progressed to having more obstruction as time goes on but at this time he can hold off. We discussed weight loss management and exercise. He can get back to routine walking. Patient has lung cancer screening CTs planned. Follow-up with cardiology already planned. If needed can continue use of albuterol for shortness of breath and wheezing.  Patient to return to clinic in 6 months or as needed.  Greater than 50% of this patient's 15-minute of visit was spent face-to-face discussing above recommendations and treatment plan.    Current Outpatient Medications:  .  amiodarone (PACERONE) 200 MG tablet, Take 200mg  twice daily for one month and decrease to 1 tablet daily, Disp: 60 tablet, Rfl: 0 .  apixaban (ELIQUIS) 5 MG TABS tablet, Take 1 tablet (5 mg total) by mouth 2 (two) times daily., Disp: 60 tablet, Rfl: 4 .  atorvastatin (LIPITOR) 20 MG tablet, Take 1 tablet (20 mg total) by mouth daily., Disp: 30 tablet, Rfl: 2 .  divalproex (DEPAKOTE) 250 MG DR tablet, Take 750 mg by mouth at bedtime. , Disp: , Rfl:  .  FLUoxetine (PROZAC) 20  MG capsule, Take 20 mg by mouth daily. , Disp: , Rfl:  .  hydrOXYzine (ATARAX/VISTARIL) 10 MG tablet, Take 10 mg by mouth daily. , Disp: , Rfl:  .  ketoconazole (NIZORAL) 2 % shampoo, Apply 1 application topically 2 (two) times a week. , Disp: , Rfl:  .  metoprolol succinate (TOPROL XL) 25 MG 24 hr tablet, Take 1 tablet (25 mg total) by mouth daily., Disp: 30 tablet, Rfl: 3 .  tamsulosin (FLOMAX) 0.4 MG CAPS capsule, Take 0.4 mg by mouth as needed (PRN KIDNEY STONES). , Disp: , Rfl:  .  Tiotropium Bromide-Olodaterol (STIOLTO RESPIMAT) 2.5-2.5 MCG/ACT AERS, Inhale 2 puffs into the lungs daily. (Patient not taking: Reported on 11/04/2019), Disp: 4 g, Rfl: 0   Garner Nash, DO  Pulmonary Critical Care 11/04/2019 9:59 AM

## 2019-11-04 NOTE — Patient Instructions (Addendum)
Thank you for visiting Dr. Valeta Harms at [facility]. Today we recommend the following:  I would hold off on any inhalers at this time with normal lung function Ok to walk with exercise. I agree, do not over do yourself and walk for short distances.  Continue to  Your HST is pending.   Please set patient up with Dr. Ander Slade for next available sleep consultation after his HST has been complete.    Please do your part to reduce the spread of COVID-19.

## 2019-11-22 ENCOUNTER — Ambulatory Visit (HOSPITAL_COMMUNITY)
Admission: RE | Admit: 2019-11-22 | Discharge: 2019-11-22 | Disposition: A | Discharge status: Home / Self Care | Payer: Medicare HMO | Source: Ambulatory Visit | Attending: Physician Assistant | Admitting: Physician Assistant

## 2019-11-22 ENCOUNTER — Other Ambulatory Visit: Payer: Self-pay

## 2019-11-22 VITALS — BP 100/66 | HR 62 | Ht 71.0 in | Wt 252.2 lb

## 2019-11-22 DIAGNOSIS — Z6835 Body mass index (BMI) 35.0-35.9, adult: Secondary | ICD-10-CM | POA: Diagnosis not present

## 2019-11-22 DIAGNOSIS — E669 Obesity, unspecified: Secondary | ICD-10-CM | POA: Insufficient documentation

## 2019-11-22 DIAGNOSIS — Z7901 Long term (current) use of anticoagulants: Secondary | ICD-10-CM | POA: Diagnosis not present

## 2019-11-22 DIAGNOSIS — I4510 Unspecified right bundle-branch block: Secondary | ICD-10-CM | POA: Diagnosis not present

## 2019-11-22 DIAGNOSIS — R0683 Snoring: Secondary | ICD-10-CM | POA: Diagnosis not present

## 2019-11-22 DIAGNOSIS — I4819 Other persistent atrial fibrillation: Secondary | ICD-10-CM

## 2019-11-22 DIAGNOSIS — Z823 Family history of stroke: Secondary | ICD-10-CM | POA: Insufficient documentation

## 2019-11-22 DIAGNOSIS — I700 Atherosclerosis of aorta: Secondary | ICD-10-CM | POA: Diagnosis not present

## 2019-11-22 DIAGNOSIS — J439 Emphysema, unspecified: Secondary | ICD-10-CM | POA: Diagnosis not present

## 2019-11-22 DIAGNOSIS — I2510 Atherosclerotic heart disease of native coronary artery without angina pectoris: Secondary | ICD-10-CM | POA: Diagnosis not present

## 2019-11-22 DIAGNOSIS — R9431 Abnormal electrocardiogram [ECG] [EKG]: Secondary | ICD-10-CM | POA: Insufficient documentation

## 2019-11-22 DIAGNOSIS — Z87891 Personal history of nicotine dependence: Secondary | ICD-10-CM | POA: Diagnosis not present

## 2019-11-22 DIAGNOSIS — Z8249 Family history of ischemic heart disease and other diseases of the circulatory system: Secondary | ICD-10-CM | POA: Insufficient documentation

## 2019-11-22 DIAGNOSIS — E785 Hyperlipidemia, unspecified: Secondary | ICD-10-CM | POA: Diagnosis not present

## 2019-11-22 DIAGNOSIS — Z79899 Other long term (current) drug therapy: Secondary | ICD-10-CM | POA: Insufficient documentation

## 2019-11-22 DIAGNOSIS — R400 Somnolence: Secondary | ICD-10-CM | POA: Diagnosis not present

## 2019-11-22 DIAGNOSIS — D6869 Other thrombophilia: Secondary | ICD-10-CM

## 2019-11-22 DIAGNOSIS — I081 Rheumatic disorders of both mitral and tricuspid valves: Secondary | ICD-10-CM | POA: Insufficient documentation

## 2019-11-22 DIAGNOSIS — Z888 Allergy status to other drugs, medicaments and biological substances status: Secondary | ICD-10-CM | POA: Diagnosis not present

## 2019-11-22 DIAGNOSIS — Z882 Allergy status to sulfonamides status: Secondary | ICD-10-CM | POA: Insufficient documentation

## 2019-11-22 LAB — T4, FREE: Free T4: 1.24 ng/dL — ABNORMAL HIGH (ref 0.61–1.12)

## 2019-11-22 LAB — COMPREHENSIVE METABOLIC PANEL
ALT: 29 U/L (ref 0–44)
AST: 38 U/L (ref 15–41)
Albumin: 3.5 g/dL (ref 3.5–5.0)
Alkaline Phosphatase: 51 U/L (ref 38–126)
Anion gap: 10 (ref 5–15)
BUN: 21 mg/dL (ref 8–23)
CO2: 23 mmol/L (ref 22–32)
Calcium: 8.7 mg/dL — ABNORMAL LOW (ref 8.9–10.3)
Chloride: 108 mmol/L (ref 98–111)
Creatinine, Ser: 1.39 mg/dL — ABNORMAL HIGH (ref 0.61–1.24)
GFR calc Af Amer: 59 mL/min — ABNORMAL LOW (ref >60)
GFR calc non Af Amer: 51 mL/min — ABNORMAL LOW (ref >60)
Glucose, Bld: 94 mg/dL (ref 70–99)
Potassium: 4.1 mmol/L (ref 3.5–5.1)
Sodium: 141 mmol/L (ref 135–145)
Total Bilirubin: 1 mg/dL (ref 0.3–1.2)
Total Protein: 6.2 g/dL — ABNORMAL LOW (ref 6.5–8.1)

## 2019-11-22 LAB — CBC
HCT: 43.8 % (ref 39.0–52.0)
Hemoglobin: 14.4 g/dL (ref 13.0–17.0)
MCH: 33 pg (ref 26.0–34.0)
MCHC: 32.9 g/dL (ref 30.0–36.0)
MCV: 100.5 fL — ABNORMAL HIGH (ref 80.0–100.0)
Platelets: 175 10*3/uL (ref 150–400)
RBC: 4.36 MIL/uL (ref 4.22–5.81)
RDW: 13.1 % (ref 11.5–15.5)
WBC: 3.5 10*3/uL — ABNORMAL LOW (ref 4.0–10.5)
nRBC: 0 % (ref 0.0–0.2)

## 2019-11-22 LAB — TSH: TSH: 5.246 u[IU]/mL — ABNORMAL HIGH (ref 0.350–4.500)

## 2019-11-22 MED ORDER — APIXABAN 5 MG PO TABS: 5.0000 mg | ORAL_TABLET | Freq: Two times a day (BID) | ORAL | 4 refills | Status: DC

## 2019-11-22 MED ORDER — AMIODARONE HCL 200 MG PO TABS: 200.0000 mg | ORAL_TABLET | Freq: Every day | ORAL | 3 refills | Status: DC

## 2019-11-22 NOTE — H&P (View-Only) (Signed)
Primary Care Physician: Gregor Hams, MD Primary Cardiologist: none Primary Electrophysiologist: one Referring Physician: [facility]   Joshua [name] is a 74 y.o. male with a history of paroxysmal atrial fibrillation, emphysema, RBBB, aortic atherosclerosis who presents for consultation in the [facility].  The patient was initially diagnosed with atrial fibrillation in 2013 and saw cardiology at [facility]. More recently, he was seen by pulmonology for increased SOB and was found to be in rate controlled afib. His D-dimer was also noted to be elevated and so he was sent to the ER for evaluation. CTA and ultrasounds negative for PE or DVT. Patient was started on Eliquis for a CHADS2VASC score of 2. Patient reports that in hindsight, his SOB has been steadily getting worse for 2-3 months. He does note that his heart rate will jump up to 170s-180 when he walks on the treadmill. He does have occasional palpitations. He admits to snoring and daytime somnolence and has a sleep study pending. He denies significant alcohol use. Patient is s/p unsuccessful DCCV on 10/15/19. Echo showed preserved EF with severely dilated LA.   On follow up today, patient remains in rate controlled afib with symptoms of dyspnea with exertion. He is tolerating the medication without difficulty. He denies any missed doses of anticoagulation.  Today, he denies symptoms of palpitations, chest pain, orthopnea, PND, lower extremity edema, dizziness, presyncope, syncope, snoring, daytime somnolence, bleeding, or neurologic sequela. The patient is tolerating medications without difficulties and is otherwise without complaint today.    Atrial Fibrillation Risk Factors:  he does have symptoms or diagnosis of sleep apnea. Sleep study is pending per pulmonology. he does not have a history of rheumatic fever. he does not have a history of alcohol use. The patient does not have a history of early  familial atrial fibrillation or other arrhythmias.  he has a BMI of Body mass index is 35.17 kg/m.Marland Kitchen Filed Weights   11/22/19 0905  Weight: 114.4 kg    Family History  Problem Relation Age of Onset  . Varicose Veins Mother   . Stroke Father      Atrial Fibrillation Management history:  Previous antiarrhythmic drugs: amiodarone Previous cardioversions: 10/15/19 Previous ablations: none CHADS2VASC score: 2 Anticoagulation history: Eliquis   Past Medical History:  Diagnosis Date  . Hyperlipidemia   . Kidney stone    Past Surgical History:  Procedure Laterality Date  . CARDIOVERSION N/A 10/15/2019   Procedure: CARDIOVERSION;  Surgeon: Jerline Pain, MD;  Location: [facility] ENDOSCOPY;  Service: Cardiovascular;  Laterality: N/A;  . HEMORRHOID SURGERY      Current Outpatient Medications  Medication Sig Dispense Refill  . amiodarone (PACERONE) 200 MG tablet Take 1 tablet (200 mg total) by mouth daily. 30 tablet 3  . apixaban (ELIQUIS) 5 MG TABS tablet Take 1 tablet (5 mg total) by mouth 2 (two) times daily. 60 tablet 4  . atorvastatin (LIPITOR) 20 MG tablet Take 1 tablet (20 mg total) by mouth daily. 30 tablet 2  . divalproex (DEPAKOTE) 250 MG DR tablet Take 750 mg by mouth at bedtime.     Marland Kitchen FLUoxetine (PROZAC) 20 MG capsule Take 20 mg by mouth daily.     . hydrOXYzine (ATARAX/VISTARIL) 10 MG tablet Take 10 mg by mouth daily.     Marland Kitchen ketoconazole (NIZORAL) 2 % shampoo Apply 1 application topically 2 (two) times a week.     . metoprolol succinate (TOPROL XL) 25 MG 24 hr tablet  Take 1 tablet (25 mg total) by mouth daily. 30 tablet 3  . tamsulosin (FLOMAX) 0.4 MG CAPS capsule Take 0.4 mg by mouth as needed (PRN KIDNEY STONES).      No current facility-administered medications for this encounter.    Allergies  Allergen Reactions  . Venlafaxine Palpitations  . Bupropion Other (See Comments)    unknown  . Zoloft [Sertraline Hcl] Other (See Comments)    Tick    Social History    Socioeconomic History  . Marital status: Married    Spouse name: Not on file  . Number of children: Not on file  . Years of education: Not on file  . Highest education level: Not on file  Occupational History  . Not on file  Tobacco Use  . Smoking status: Former Smoker    Packs/day: 2.00    Years: 46.00    Pack years: 92.00    Quit date: 2010    Years since quitting: 11.0  . Smokeless tobacco: Never Used  Substance and Sexual Activity  . Alcohol use: Not Currently    Alcohol/week: 1.0 - 2.0 standard drinks    Types: 1 - 2 Cans of beer per week    Comment: social  . Drug use: No  . Sexual activity: Not on file  Other Topics Concern  . Not on file  Social History Narrative  . Not on file   Social Determinants of Health   Financial Resource Strain:   . Difficulty of Paying Living Expenses: Not on file  Food Insecurity:   . Worried About Charity fundraiser in the Last Year: Not on file  . Ran Out of Food in the Last Year: Not on file  Transportation Needs:   . Lack of Transportation (Medical): Not on file  . Lack of Transportation (Non-Medical): Not on file  Physical Activity:   . Days of Exercise per Week: Not on file  . Minutes of Exercise per Session: Not on file  Stress:   . Feeling of Stress : Not on file  Social Connections:   . Frequency of Communication with Friends and Family: Not on file  . Frequency of Social Gatherings with Friends and Family: Not on file  . Attends Religious Services: Not on file  . Active Member of Clubs or Organizations: Not on file  . Attends Archivist Meetings: Not on file  . Marital Status: Not on file  Intimate Partner Violence:   . Fear of Current or Ex-Partner: Not on file  . Emotionally Abused: Not on file  . Physically Abused: Not on file  . Sexually Abused: Not on file     ROS- All systems are reviewed and negative except as per the HPI above.  Physical Exam: Vitals:   11/22/19 0905  BP: 100/66   Pulse: 62  Weight: 114.4 kg  Height: 5\' 11"  (1.803 m)    GEN- The patient is well appearing obese male, alert and oriented x 3 today.   HEENT-head normocephalic, atraumatic, sclera clear, conjunctiva pink, hearing intact, trachea midline. Lungs- Clear to ausculation bilaterally, normal work of breathing Heart- irregular rate and rhythm, no murmurs, rubs or gallops  GI- soft, NT, ND, + BS Extremities- no clubbing, cyanosis, or edema MS- no significant deformity or atrophy Skin- no rash or lesion Psych- euthymic mood, full affect Neuro- strength and sensation are intact   Wt Readings from Last 3 Encounters:  11/22/19 114.4 kg  11/04/19 113.1 kg  10/22/19 112.6 kg  EKG today demonstrates afib HR 62, RBBB, QRS 162, QTc 491  Echo 10/05/19  1. Left ventricular ejection fraction, by visual estimation, is 65 to 70%. The left ventricle has hyperdynamic function. There is no left ventricular hypertrophy.  2. Left ventricular diastolic function could not be evaluated.  3. Global right ventricle has moderately reduced systolic function.The right ventricular size is normal. No increase in right ventricular wall thickness.  4. Left atrial size was severely dilated.  5. Right atrial size was moderately dilated.  6. The mitral valve is normal in structure. Mild mitral valve regurgitation.  7. The tricuspid valve is normal in structure. Tricuspid valve regurgitation is mild.  8. The aortic valve is normal in structure. Aortic valve regurgitation is not visualized. No evidence of aortic valve sclerosis or stenosis.  9. The pulmonic valve was normal in structure. Pulmonic valve regurgitation is not visualized. 10. Aortic dilatation noted. 11. 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 12. Normal pulmonary artery systolic pressure. 13. The atrial septum is grossly normal.  Epic records are reviewed at length today  Assessment and Plan:  1. Persistent atrial  fibrillation S/p unsuccessful DCCV 10/15/19. Decrease amiodarone to 200 mg daily. Will arrange for repeat DCCV now that he has loaded on amiodarone.  Check Cmet/TSH/CBC today. Continue Toprol 25 mg daily Continue Eliquis 5 mg BID  This patients CHA2DS2-VASc Score and unadjusted Ischemic Stroke Rate (% per year) is equal to 2.2 % stroke rate/year from a score of 2  Above score calculated as 1 point each if present [CHF, HTN, DM, Vascular=MI/PAD/Aortic Plaque, Age if 65-74, or Male] Above score calculated as 2 points each if present [Age > 75, or Stroke/TIA/TE]   2. Obesity Body mass index is 35.17 kg/m. Lifestyle modification was discussed and encouraged including regular physical activity and weight reduction.  3. Snoring The importance of adequate treatment of sleep apnea was discussed today in order to improve our ability to maintain sinus rhythm long term. Sleep study pending per pulmonology.   4. CAD Coronary artery calcification seen on chest CT.  No anginal symptoms.   Follow up in the AF clinic one week post DCCV.   [facility] [address] [place], [place] [address] [phone] 11/22/2019 1:07 PM

## 2019-11-22 NOTE — Patient Instructions (Addendum)
Cardioversion scheduled for Monday, January 25th   - Arrive at the [facility] and go to admitting at [place] not eat or drink anything after midnight the night prior to your procedure.  - Take all your morning medication with a sip of water prior to arrival.  - You will not be able to drive home after your procedure.  Amiodarone to 200mg  once a day

## 2019-11-22 NOTE — Progress Notes (Signed)
Primary Care Physician: Gregor Hams, MD Primary Cardiologist: none Primary Electrophysiologist: one Referring Physician: [facility]   [place] III is a 74 y.o. male with a history of paroxysmal atrial fibrillation, emphysema, RBBB, aortic atherosclerosis who presents for consultation in the [facility].  The patient was initially diagnosed with atrial fibrillation in 2013 and saw cardiology at [facility]. More recently, he was seen by pulmonology for increased SOB and was found to be in rate controlled afib. His D-dimer was also noted to be elevated and so he was sent to the ER for evaluation. CTA and ultrasounds negative for PE or DVT. Patient was started on Eliquis for a CHADS2VASC score of 2. Patient reports that in hindsight, his SOB has been steadily getting worse for 2-3 months. He does note that his heart rate will jump up to 170s-180 when he walks on the treadmill. He does have occasional palpitations. He admits to snoring and daytime somnolence and has a sleep study pending. He denies significant alcohol use. Patient is s/p unsuccessful DCCV on 10/15/19. Echo showed preserved EF with severely dilated LA.   On follow up today, patient remains in rate controlled afib with symptoms of dyspnea with exertion. He is tolerating the medication without difficulty. He denies any missed doses of anticoagulation.  Today, he denies symptoms of palpitations, chest pain, orthopnea, PND, lower extremity edema, dizziness, presyncope, syncope, snoring, daytime somnolence, bleeding, or neurologic sequela. The patient is tolerating medications without difficulties and is otherwise without complaint today.    Atrial Fibrillation Risk Factors:  he does have symptoms or diagnosis of sleep apnea. Sleep study is pending per pulmonology. he does not have a history of rheumatic fever. he does not have a history of alcohol use. The patient does not have a history of early  familial atrial fibrillation or other arrhythmias.  he has a BMI of Body mass index is 35.17 kg/m.Marland Kitchen Filed Weights   11/22/19 0905  Weight: 114.4 kg    Family History  Problem Relation Age of Onset  . Varicose Veins Mother   . Stroke Father      Atrial Fibrillation Management history:  Previous antiarrhythmic drugs: amiodarone Previous cardioversions: 10/15/19 Previous ablations: none CHADS2VASC score: 2 Anticoagulation history: Eliquis   Past Medical History:  Diagnosis Date  . Hyperlipidemia   . Kidney stone    Past Surgical History:  Procedure Laterality Date  . CARDIOVERSION N/A 10/15/2019   Procedure: CARDIOVERSION;  Surgeon: Jerline Pain, MD;  Location: [facility] ENDOSCOPY;  Service: Cardiovascular;  Laterality: N/A;  . HEMORRHOID SURGERY      Current Outpatient Medications  Medication Sig Dispense Refill  . amiodarone (PACERONE) 200 MG tablet Take 1 tablet (200 mg total) by mouth daily. 30 tablet 3  . apixaban (ELIQUIS) 5 MG TABS tablet Take 1 tablet (5 mg total) by mouth 2 (two) times daily. 60 tablet 4  . atorvastatin (LIPITOR) 20 MG tablet Take 1 tablet (20 mg total) by mouth daily. 30 tablet 2  . divalproex (DEPAKOTE) 250 MG DR tablet Take 750 mg by mouth at bedtime.     Marland Kitchen FLUoxetine (PROZAC) 20 MG capsule Take 20 mg by mouth daily.     . hydrOXYzine (ATARAX/VISTARIL) 10 MG tablet Take 10 mg by mouth daily.     Marland Kitchen ketoconazole (NIZORAL) 2 % shampoo Apply 1 application topically 2 (two) times a week.     . metoprolol succinate (TOPROL XL) 25 MG 24 hr tablet  Take 1 tablet (25 mg total) by mouth daily. 30 tablet 3  . tamsulosin (FLOMAX) 0.4 MG CAPS capsule Take 0.4 mg by mouth as needed (PRN KIDNEY STONES).      No current facility-administered medications for this encounter.    Allergies  Allergen Reactions  . Venlafaxine Palpitations  . Bupropion Other (See Comments)    unknown  . Zoloft [Sertraline Hcl] Other (See Comments)    Tick    Social History    Socioeconomic History  . Marital status: Married    Spouse name: Not on file  . Number of children: Not on file  . Years of education: Not on file  . Highest education level: Not on file  Occupational History  . Not on file  Tobacco Use  . Smoking status: Former Smoker    Packs/day: 2.00    Years: 46.00    Pack years: 92.00    Quit date: 2010    Years since quitting: 11.0  . Smokeless tobacco: Never Used  Substance and Sexual Activity  . Alcohol use: Not Currently    Alcohol/week: 1.0 - 2.0 standard drinks    Types: 1 - 2 Cans of beer per week    Comment: social  . Drug use: No  . Sexual activity: Not on file  Other Topics Concern  . Not on file  Social History Narrative  . Not on file   Social Determinants of Health   Financial Resource Strain:   . Difficulty of Paying Living Expenses: Not on file  Food Insecurity:   . Worried About Charity fundraiser in the Last Year: Not on file  . Ran Out of Food in the Last Year: Not on file  Transportation Needs:   . Lack of Transportation (Medical): Not on file  . Lack of Transportation (Non-Medical): Not on file  Physical Activity:   . Days of Exercise per Week: Not on file  . Minutes of Exercise per Session: Not on file  Stress:   . Feeling of Stress : Not on file  Social Connections:   . Frequency of Communication with Friends and Family: Not on file  . Frequency of Social Gatherings with Friends and Family: Not on file  . Attends Religious Services: Not on file  . Active Member of Clubs or Organizations: Not on file  . Attends Archivist Meetings: Not on file  . Marital Status: Not on file  Intimate Partner Violence:   . Fear of Current or Ex-Partner: Not on file  . Emotionally Abused: Not on file  . Physically Abused: Not on file  . Sexually Abused: Not on file     ROS- All systems are reviewed and negative except as per the HPI above.  Physical Exam: Vitals:   11/22/19 0905  BP: 100/66   Pulse: 62  Weight: 114.4 kg  Height: 5\' 11"  (1.803 m)    GEN- The patient is well appearing obese male, alert and oriented x 3 today.   HEENT-head normocephalic, atraumatic, sclera clear, conjunctiva pink, hearing intact, trachea midline. Lungs- Clear to ausculation bilaterally, normal work of breathing Heart- irregular rate and rhythm, no murmurs, rubs or gallops  GI- soft, NT, ND, + BS Extremities- no clubbing, cyanosis, or edema MS- no significant deformity or atrophy Skin- no rash or lesion Psych- euthymic mood, full affect Neuro- strength and sensation are intact   Wt Readings from Last 3 Encounters:  11/22/19 114.4 kg  11/04/19 113.1 kg  10/22/19 112.6 kg  EKG today demonstrates afib HR 62, RBBB, QRS 162, QTc 491  Echo 10/05/19  1. Left ventricular ejection fraction, by visual estimation, is 65 to 70%. The left ventricle has hyperdynamic function. There is no left ventricular hypertrophy.  2. Left ventricular diastolic function could not be evaluated.  3. Global right ventricle has moderately reduced systolic function.The right ventricular size is normal. No increase in right ventricular wall thickness.  4. Left atrial size was severely dilated.  5. Right atrial size was moderately dilated.  6. The mitral valve is normal in structure. Mild mitral valve regurgitation.  7. The tricuspid valve is normal in structure. Tricuspid valve regurgitation is mild.  8. The aortic valve is normal in structure. Aortic valve regurgitation is not visualized. No evidence of aortic valve sclerosis or stenosis.  9. The pulmonic valve was normal in structure. Pulmonic valve regurgitation is not visualized. 10. Aortic dilatation noted. 11. 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 12. Normal pulmonary artery systolic pressure. 13. The atrial septum is grossly normal.  Epic records are reviewed at length today  Assessment and Plan:  1. Persistent atrial  fibrillation S/p unsuccessful DCCV 10/15/19. Decrease amiodarone to 200 mg daily. Will arrange for repeat DCCV now that he has loaded on amiodarone.  Check Cmet/TSH/CBC today. Continue Toprol 25 mg daily Continue Eliquis 5 mg BID  This patients CHA2DS2-VASc Score and unadjusted Ischemic Stroke Rate (% per year) is equal to 2.2 % stroke rate/year from a score of 2  Above score calculated as 1 point each if present [CHF, HTN, DM, Vascular=MI/PAD/Aortic Plaque, Age if 65-74, or Male] Above score calculated as 2 points each if present [Age > 75, or Stroke/TIA/TE]   2. Obesity Body mass index is 35.17 kg/m. Lifestyle modification was discussed and encouraged including regular physical activity and weight reduction.  3. Snoring The importance of adequate treatment of sleep apnea was discussed today in order to improve our ability to maintain sinus rhythm long term. Sleep study pending per pulmonology.   4. CAD Coronary artery calcification seen on chest CT.  No anginal symptoms.   Follow up in the AF clinic one week post DCCV.   [facility] [address] [place], [place] [address] [phone] 11/22/2019 1:07 PM

## 2019-11-22 NOTE — Addendum Note (Signed)
Encounter addended by: Juluis Mire, RN on: 11/22/2019 2:04 PM  Actions taken: Order list changed

## 2019-11-23 ENCOUNTER — Encounter (HOSPITAL_COMMUNITY): Payer: Self-pay | Admitting: *Deleted

## 2019-11-25 ENCOUNTER — Other Ambulatory Visit (HOSPITAL_COMMUNITY)
Admission: RE | Admit: 2019-11-25 | Discharge: 2019-11-25 | Disposition: A | Discharge status: Home / Self Care | Payer: Medicare HMO | Source: Ambulatory Visit | Attending: Cardiology | Admitting: Cardiology

## 2019-11-25 DIAGNOSIS — Z20822 Contact with and (suspected) exposure to covid-19: Secondary | ICD-10-CM | POA: Insufficient documentation

## 2019-11-25 DIAGNOSIS — Z01812 Encounter for preprocedural laboratory examination: Secondary | ICD-10-CM | POA: Insufficient documentation

## 2019-11-25 LAB — SARS CORONAVIRUS 2 (TAT 6-24 HRS): SARS Coronavirus 2: NEGATIVE

## 2019-11-29 ENCOUNTER — Encounter (HOSPITAL_COMMUNITY)
Admission: RE | Disposition: A | Discharge status: Home / Self Care | Payer: Self-pay | Source: Home / Self Care | Attending: Cardiology

## 2019-11-29 ENCOUNTER — Ambulatory Visit (HOSPITAL_COMMUNITY)
Admission: RE | Admit: 2019-11-29 | Discharge: 2019-11-29 | Disposition: A | Discharge status: Home / Self Care | Payer: Medicare HMO | Attending: Cardiology | Admitting: Cardiology

## 2019-11-29 ENCOUNTER — Other Ambulatory Visit: Payer: Self-pay

## 2019-11-29 ENCOUNTER — Ambulatory Visit (HOSPITAL_COMMUNITY): Payer: Medicare HMO | Admitting: Anesthesiology

## 2019-11-29 DIAGNOSIS — Z6835 Body mass index (BMI) 35.0-35.9, adult: Secondary | ICD-10-CM | POA: Diagnosis not present

## 2019-11-29 DIAGNOSIS — Z87891 Personal history of nicotine dependence: Secondary | ICD-10-CM | POA: Insufficient documentation

## 2019-11-29 DIAGNOSIS — I4819 Other persistent atrial fibrillation: Secondary | ICD-10-CM | POA: Insufficient documentation

## 2019-11-29 DIAGNOSIS — Z7901 Long term (current) use of anticoagulants: Secondary | ICD-10-CM | POA: Insufficient documentation

## 2019-11-29 DIAGNOSIS — R0683 Snoring: Secondary | ICD-10-CM | POA: Insufficient documentation

## 2019-11-29 DIAGNOSIS — E785 Hyperlipidemia, unspecified: Secondary | ICD-10-CM | POA: Insufficient documentation

## 2019-11-29 DIAGNOSIS — Z888 Allergy status to other drugs, medicaments and biological substances status: Secondary | ICD-10-CM | POA: Diagnosis not present

## 2019-11-29 DIAGNOSIS — Z79899 Other long term (current) drug therapy: Secondary | ICD-10-CM | POA: Insufficient documentation

## 2019-11-29 DIAGNOSIS — I2510 Atherosclerotic heart disease of native coronary artery without angina pectoris: Secondary | ICD-10-CM | POA: Insufficient documentation

## 2019-11-29 DIAGNOSIS — I4891 Unspecified atrial fibrillation: Secondary | ICD-10-CM | POA: Diagnosis not present

## 2019-11-29 DIAGNOSIS — E669 Obesity, unspecified: Secondary | ICD-10-CM | POA: Diagnosis not present

## 2019-11-29 HISTORY — PX: CARDIOVERSION: SHX1299

## 2019-11-29 SURGERY — CARDIOVERSION
Anesthesia: General

## 2019-11-29 MED ORDER — SODIUM CHLORIDE 0.9 % IV SOLN: INTRAVENOUS | Status: DC | PRN

## 2019-11-29 MED ORDER — EPHEDRINE SULFATE 50 MG/ML IJ SOLN
INTRAMUSCULAR | Status: DC | PRN
  Administered 2019-11-29: 10 mg via INTRAVENOUS

## 2019-11-29 MED ORDER — LIDOCAINE 2% (20 MG/ML) 5 ML SYRINGE
INTRAMUSCULAR | Status: DC | PRN
  Administered 2019-11-29: 60 mg via INTRAVENOUS

## 2019-11-29 MED ORDER — PROPOFOL 10 MG/ML IV BOLUS
INTRAVENOUS | Status: DC | PRN
  Administered 2019-11-29: 100 mg via INTRAVENOUS

## 2019-11-29 MED ORDER — ATROPINE SULFATE 1 MG/ML IJ SOLN
1.0000 mg | Freq: Once | INTRAMUSCULAR | Status: AC
  Administered 2019-11-29: 1 mg via INTRAVENOUS

## 2019-11-29 NOTE — Interval H&P Note (Signed)
History and Physical Interval Note:  11/29/2019 9:14 AM  Joshua [name]  has presented today for surgery, with the diagnosis of A-FIB.  The various methods of treatment have been discussed with the patient and family. After consideration of risks, benefits and other options for treatment, the patient has consented to  Procedure(s): CARDIOVERSION (N/A) as a surgical intervention.  The patient's history has been reviewed, patient examined, no change in status, stable for surgery.  I have reviewed the patient's chart and labs.  Questions were answered to the patient's satisfaction.     Ena Dawley

## 2019-11-29 NOTE — Anesthesia Preprocedure Evaluation (Addendum)
Anesthesia Evaluation  Patient identified by MRN, date of birth, ID band Patient awake    Reviewed: Allergy & Precautions, NPO status , Patient's Chart, lab work & pertinent test results  Airway Mallampati: III  TM Distance: >3 FB Neck ROM: Full    Dental no notable dental hx.    Pulmonary former smoker,    Pulmonary exam normal breath sounds clear to auscultation       Cardiovascular + dysrhythmias Atrial Fibrillation  Rhythm:Irregular Rate:Normal  ECG: rate 62. Atrial fibrillation Right bundle branch block  ECHO: 1. Left ventricular ejection fraction, by visual estimation, is 65 to 70%. The left ventricle has hyperdynamic function. There is no left ventricular hypertrophy. 2. Left ventricular diastolic function could not be evaluated. 3. Global right ventricle has moderately reduced systolic function.The right ventricular size is normal. No increase in right ventricular wall thickness. 4. Left atrial size was severely dilated. 5. Right atrial size was moderately dilated. 6. The mitral valve is normal in structure. Mild mitral valve regurgitation. 7. The tricuspid valve is normal in structure. Tricuspid valve regurgitation is mild. 8. The aortic valve is normal in structure. Aortic valve regurgitation is not visualized. No evidence of aortic valve sclerosis or stenosis. 9. The pulmonic valve was normal in structure. Pulmonic valve regurgitation is not visualized. 10. Aortic dilatation noted. 11. 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 12. Normal pulmonary artery systolic pressure. 13. The atrial septum is grossly normal.   Neuro/Psych PSYCHIATRIC DISORDERS negative neurological ROS     GI/Hepatic negative GI ROS, Neg liver ROS,   Endo/Other  negative endocrine ROS  Renal/GU negative Renal ROS     Musculoskeletal negative musculoskeletal ROS (+)   Abdominal (+) + obese,   Peds   Hematology HLD   Anesthesia Other Findings A-FIB  Reproductive/Obstetrics                            Anesthesia Physical Anesthesia Plan  ASA: III  Anesthesia Plan: General   Post-op Pain Management:    Induction:   PONV Risk Score and Plan: 2 and Propofol infusion and Treatment may vary due to age or medical condition  Airway Management Planned: Mask  Additional Equipment:   Intra-op Plan:   Post-operative Plan:   Informed Consent: I have reviewed the patients History and Physical, chart, labs and discussed the procedure including the risks, benefits and alternatives for the proposed anesthesia with the patient or authorized representative who has indicated his/her understanding and acceptance.     Dental advisory given  Plan Discussed with: CRNA  Anesthesia Plan Comments:        Anesthesia Quick Evaluation

## 2019-11-29 NOTE — Transfer of Care (Signed)
Immediate Anesthesia Transfer of Care Note  Patient: Joshua [name]  Procedure(s) Performed: CARDIOVERSION (N/A )  Patient Location: Endoscopy Unit  Anesthesia Type:General  Level of Consciousness: drowsy and patient cooperative  Airway & Oxygen Therapy: Patient Spontanous Breathing  Post-op Assessment: Report given to RN, Post -op Vital signs reviewed and stable and Patient moving all extremities X 4  Post vital signs: Reviewed and stable  Last Vitals:  Vitals Value Taken Time  BP    Temp    Pulse    Resp    SpO2      Last Pain:  Vitals:   11/29/19 0906  TempSrc: Temporal  PainSc: 0-No pain         Complications: No apparent anesthesia complications

## 2019-11-29 NOTE — Discharge Instructions (Signed)
Electrical Cardioversion Electrical cardioversion is the delivery of a jolt of electricity to restore a normal rhythm to the heart. A rhythm that is too fast or is not regular keeps the heart from pumping well. In this procedure, sticky patches or metal paddles are placed on the chest to deliver electricity to the heart from a device. This procedure may be done in an emergency if:  There is low or no blood pressure as a result of the heart rhythm.  Normal rhythm must be restored as fast as possible to protect the brain and heart from further damage.  It may save a life. This may also be a scheduled procedure for irregular or fast heart rhythms that are not immediately life-threatening. Tell a health care provider about:  Any allergies you have.  All medicines you are taking, including vitamins, herbs, eye drops, creams, and over-the-counter medicines.  Any problems you or family members have had with anesthetic medicines.  Any blood disorders you have.  Any surgeries you have had.  Any medical conditions you have.  Whether you are pregnant or may be pregnant. What are the risks? Generally, this is a safe procedure. However, problems may occur, including:  Allergic reactions to medicines.  A blood clot that breaks free and travels to other parts of your body.  The possible return of an abnormal heart rhythm within hours or days after the procedure.  Your heart stopping (cardiac arrest). This is rare. What happens before the procedure? Medicines  Your health care provider may have you start taking: ? Blood-thinning medicines (anticoagulants) so your blood does not clot as easily. ? Medicines to help stabilize your heart rate and rhythm.  Ask your health care provider about: ? Changing or stopping your regular medicines. This is especially important if you are taking diabetes medicines or blood thinners. ? Taking medicines such as aspirin and ibuprofen. These medicines can  thin your blood. Do not take these medicines unless your health care provider tells you to take them. ? Taking over-the-counter medicines, vitamins, herbs, and supplements. General instructions  Follow instructions from your health care provider about eating or drinking restrictions.  Plan to have someone take you home from the hospital or clinic.  If you will be going home right after the procedure, plan to have someone with you for 24 hours.  Ask your health care provider what steps will be taken to help prevent infection. These may include washing your skin with a germ-killing soap. What happens during the procedure?   An IV will be inserted into one of your veins.  Sticky patches (electrodes) or metal paddles may be placed on your chest.  You will be given a medicine to help you relax (sedative).  An electrical shock will be delivered. The procedure may vary among health care providers and hospitals. What can I expect after the procedure?  Your blood pressure, heart rate, breathing rate, and blood oxygen level will be monitored until you leave the hospital or clinic.  Your heart rhythm will be watched to make sure it does not change.  You may have some redness on the skin where the shocks were given. Follow these instructions at home:  Do not drive for 24 hours if you were given a sedative during your procedure.  Take over-the-counter and prescription medicines only as told by your health care provider.  Ask your health care provider how to check your pulse. Check it often.  Rest for 48 hours after the procedure or   as told by your health care provider.  Avoid or limit your caffeine use as told by your health care provider.  Keep all follow-up visits as told by your health care provider. This is important. Contact a health care provider if:  You feel like your heart is beating too quickly or your pulse is not regular.  You have a serious muscle cramp that does not go  away. Get help right away if:  You have discomfort in your chest.  You are dizzy or you feel faint.  You have trouble breathing or you are short of breath.  Your speech is slurred.  You have trouble moving an arm or leg on one side of your body.  Your fingers or toes turn cold or blue. Summary  Electrical cardioversion is the delivery of a jolt of electricity to restore a normal rhythm to the heart.  This procedure may be done right away in an emergency or may be a scheduled procedure if the condition is not an emergency.  Generally, this is a safe procedure.  After the procedure, check your pulse often as told by your health care provider. This information is not intended to replace advice given to you by your health care provider. Make sure you discuss any questions you have with your health care provider. Document Revised: 05/24/2019 Document Reviewed: 05/24/2019 Elsevier Patient Education  2020 [employer]  

## 2019-11-29 NOTE — CV Procedure (Signed)
    Cardioversion Note  Tromaine Balash Lunt III [id number] 04/25/1946  Procedure: DC Cardioversion Indications: atrial fibrillation  Procedure Details Consent: Obtained Time Out: Verified patient identification, verified procedure, site/side was marked, verified correct patient position, special equipment/implants available, Radiology Safety Procedures followed,  medications/allergies/relevent history reviewed, required imaging and test results available.  Performed  The patient has been on adequate anticoagulation.  The patient received IV Propofol 100 mg for sedation administered by anesthesia staff.  Synchronous cardioversion was performed at 120 joules.  The cardioversion was successful.   Complications: No apparent complications Patient did tolerate procedure well.   Ena Dawley, MD, [facility] 11/29/2019, 10:09 AM

## 2019-11-30 NOTE — Anesthesia Postprocedure Evaluation (Signed)
Anesthesia Post Note  Patient: Joshua [name]  Procedure(s) Performed: CARDIOVERSION (N/A )     Patient location during evaluation: Endoscopy Anesthesia Type: General Level of consciousness: awake and alert Pain management: pain level controlled Vital Signs Assessment: post-procedure vital signs reviewed and stable Respiratory status: spontaneous breathing, nonlabored ventilation, respiratory function stable and patient connected to nasal cannula oxygen Cardiovascular status: blood pressure returned to baseline and stable Postop Assessment: no apparent nausea or vomiting Anesthetic complications: no    Last Vitals:  Vitals:   11/29/19 1104 11/29/19 1114  BP: 112/77 113/74  Pulse: (!) 48 (!) 47  Resp: 18 13  Temp:    SpO2: 93% 96%    Last Pain:  Vitals:   11/29/19 1114  TempSrc:   PainSc: 0-No pain                 Lynnann Knudsen P Ryleah Miramontes

## 2019-12-01 ENCOUNTER — Encounter: Payer: Self-pay | Admitting: *Deleted

## 2019-12-06 ENCOUNTER — Ambulatory Visit (HOSPITAL_COMMUNITY)
Admission: RE | Admit: 2019-12-06 | Discharge: 2019-12-06 | Disposition: A | Discharge status: Home / Self Care | Payer: Medicare HMO | Source: Ambulatory Visit | Attending: Physician Assistant | Admitting: Physician Assistant

## 2019-12-06 ENCOUNTER — Ambulatory Visit: Payer: Medicare HMO

## 2019-12-06 ENCOUNTER — Other Ambulatory Visit: Payer: Self-pay

## 2019-12-06 VITALS — BP 118/62 | HR 38 | Ht 71.0 in | Wt 248.0 lb

## 2019-12-06 DIAGNOSIS — Z7901 Long term (current) use of anticoagulants: Secondary | ICD-10-CM | POA: Insufficient documentation

## 2019-12-06 DIAGNOSIS — R0683 Snoring: Secondary | ICD-10-CM | POA: Insufficient documentation

## 2019-12-06 DIAGNOSIS — I4510 Unspecified right bundle-branch block: Secondary | ICD-10-CM | POA: Diagnosis not present

## 2019-12-06 DIAGNOSIS — D6869 Other thrombophilia: Secondary | ICD-10-CM

## 2019-12-06 DIAGNOSIS — I4819 Other persistent atrial fibrillation: Secondary | ICD-10-CM | POA: Diagnosis not present

## 2019-12-06 DIAGNOSIS — Z87891 Personal history of nicotine dependence: Secondary | ICD-10-CM | POA: Insufficient documentation

## 2019-12-06 DIAGNOSIS — G4733 Obstructive sleep apnea (adult) (pediatric): Secondary | ICD-10-CM

## 2019-12-06 DIAGNOSIS — E669 Obesity, unspecified: Secondary | ICD-10-CM | POA: Insufficient documentation

## 2019-12-06 DIAGNOSIS — J439 Emphysema, unspecified: Secondary | ICD-10-CM | POA: Insufficient documentation

## 2019-12-06 DIAGNOSIS — Z79899 Other long term (current) drug therapy: Secondary | ICD-10-CM | POA: Insufficient documentation

## 2019-12-06 DIAGNOSIS — I2510 Atherosclerotic heart disease of native coronary artery without angina pectoris: Secondary | ICD-10-CM | POA: Insufficient documentation

## 2019-12-06 DIAGNOSIS — E785 Hyperlipidemia, unspecified: Secondary | ICD-10-CM | POA: Diagnosis not present

## 2019-12-06 DIAGNOSIS — R001 Bradycardia, unspecified: Secondary | ICD-10-CM | POA: Insufficient documentation

## 2019-12-06 DIAGNOSIS — Z6834 Body mass index (BMI) 34.0-34.9, adult: Secondary | ICD-10-CM | POA: Diagnosis not present

## 2019-12-06 DIAGNOSIS — Z9189 Other specified personal risk factors, not elsewhere classified: Secondary | ICD-10-CM

## 2019-12-06 DIAGNOSIS — Z888 Allergy status to other drugs, medicaments and biological substances status: Secondary | ICD-10-CM | POA: Insufficient documentation

## 2019-12-06 NOTE — Patient Instructions (Signed)
Stop metoprolol  Call on Thursday with update of Heart rate

## 2019-12-06 NOTE — Progress Notes (Signed)
Primary Care Physician: Gregor Hams, MD Primary Cardiologist: none Primary Electrophysiologist: one Referring Physician: [facility]   Joshua [name] is a 74 y.o. male with a history of paroxysmal atrial fibrillation, emphysema, RBBB, aortic atherosclerosis who presents for consultation in the [facility].  The patient was initially diagnosed with atrial fibrillation in 2013 and saw cardiology at [facility]. More recently, he was seen by pulmonology for increased SOB and was found to be in rate controlled afib. His D-dimer was also noted to be elevated and so he was sent to the ER for evaluation. CTA and ultrasounds negative for PE or DVT. Patient was started on Eliquis for a CHADS2VASC score of 2. Patient reports that in hindsight, his SOB has been steadily getting worse for 2-3 months. He does note that his heart rate will jump up to 170s-180 when he walks on the treadmill. He does have occasional palpitations. He admits to snoring and daytime somnolence and has a sleep study pending. He denies significant alcohol use. Patient is s/p unsuccessful DCCV on 10/15/19. Echo showed preserved EF with severely dilated LA.   On follow up today, patient is s/p DCCV on 11/29/19. Patient reports that he has done well since his DCCV. He reports that he has more energy and less SOB. He denies lightheadedness. He is able to do work around the house. His heart rates at home have been in the 40s.   Today, he denies symptoms of palpitations, chest pain, orthopnea, PND, lower extremity edema, dizziness, presyncope, dizziness, syncope, snoring, daytime somnolence, bleeding, or neurologic sequela. The patient is tolerating medications without difficulties and is otherwise without complaint today.    Atrial Fibrillation Risk Factors:  he does have symptoms or diagnosis of sleep apnea. Sleep study is pending per pulmonology. he does not have a history of rheumatic fever. he does  not have a history of alcohol use. The patient does not have a history of early familial atrial fibrillation or other arrhythmias.  he has a BMI of Body mass index is 34.59 kg/m.Marland Kitchen Filed Weights   12/06/19 0917  Weight: 112.5 kg    Family History  Problem Relation Age of Onset   Varicose Veins Mother    Stroke Father      Atrial Fibrillation Management history:  Previous antiarrhythmic drugs: amiodarone Previous cardioversions: 10/15/19, 11/29/19 Previous ablations: none CHADS2VASC score: 2 Anticoagulation history: Eliquis   Past Medical History:  Diagnosis Date   Hyperlipidemia    Kidney stone    Past Surgical History:  Procedure Laterality Date   CARDIOVERSION N/A 10/15/2019   Procedure: CARDIOVERSION;  Surgeon: Jerline Pain, MD;  Location: [place] [facility] ENDOSCOPY;  Service: Cardiovascular;  Laterality: N/A;   CARDIOVERSION N/A 11/29/2019   Procedure: CARDIOVERSION;  Surgeon: Dorothy Spark, MD;  Location[facility] ENDOSCOPY;  Service: Cardiovascular;  Laterality: N/A;   HEMORRHOID SURGERY      Current Outpatient Medications  Medication Sig Dispense Refill   acetaminophen (TYLENOL) 500 MG tablet Take 1,000 mg by mouth every 6 (six) hours as needed (for pain.).     amiodarone (PACERONE) 200 MG tablet Take 1 tablet (200 mg total) by mouth daily. 30 tablet 3   apixaban (ELIQUIS) 5 MG TABS tablet Take 1 tablet (5 mg total) by mouth 2 (two) times daily. 60 tablet 4   atorvastatin (LIPITOR) 20 MG tablet Take 1 tablet (20 mg total) by mouth daily. 30 tablet 2   divalproex (DEPAKOTE) 250 MG DR tablet  Take 750 mg by mouth at bedtime.      FLUoxetine (PROZAC) 20 MG capsule Take 20 mg by mouth daily.      hydrOXYzine (ATARAX/VISTARIL) 10 MG tablet Take 10 mg by mouth daily.      ketoconazole (NIZORAL) 2 % shampoo Apply 1 application topically 2 (two) times a week. Mondays & Thursdays     tamsulosin (FLOMAX) 0.4 MG CAPS capsule Take 0.4 mg by mouth daily as needed (KIDNEY  STONES).      No current facility-administered medications for this encounter.    Allergies  Allergen Reactions   Venlafaxine Palpitations   Bupropion Other (See Comments)    unknown   Zoloft [Sertraline Hcl] Other (See Comments)    Tick    Social History   Socioeconomic History   Marital status: Married    Spouse name: Not on file   Number of children: Not on file   Years of education: Not on file   Highest education level: Not on file  Occupational History   Not on file  Tobacco Use   Smoking status: Former Smoker    Packs/day: 2.00    Years: 46.00    Pack years: 92.00    Quit date: 2010    Years since quitting: 11.0   Smokeless tobacco: Never Used  Substance and Sexual Activity   Alcohol use: Not Currently    Alcohol/week: 1.0 - 2.0 standard drinks    Types: 1 - 2 Cans of beer per week    Comment: social   Drug use: No   Sexual activity: Not on file  Other Topics Concern   Not on file  Social History Narrative   Not on file   Social Determinants of Health   Financial Resource Strain:    Difficulty of Paying Living Expenses: Not on file  Food Insecurity:    Worried About Charity fundraiser in the Last Year: Not on file   [employer] of Food in the Last Year: Not on file  Transportation Needs:    Lack of Transportation (Medical): Not on file   Lack of Transportation (Non-Medical): Not on file  Physical Activity:    Days of Exercise per Week: Not on file   Minutes of Exercise per Session: Not on file  Stress:    Feeling of Stress : Not on file  Social Connections:    Frequency of Communication with Friends and Family: Not on file   Frequency of Social Gatherings with Friends and Family: Not on file   Attends Religious Services: Not on file   Active Member of Clubs or Organizations: Not on file   Attends Archivist Meetings: Not on file   Marital Status: Not on file  Intimate Partner Violence:    Fear of Current or  Ex-Partner: Not on file   Emotionally Abused: Not on file   Physically Abused: Not on file   Sexually Abused: Not on file     ROS- All systems are reviewed and negative except as per the HPI above.  Physical Exam: Vitals:   12/06/19 0917  BP: 118/62  Pulse: (!) 38  Weight: 112.5 kg  Height: 5\' 11"  (1.803 m)    GEN- The patient is well appearing obese male, alert and oriented x 3 today.   HEENT-head normocephalic, atraumatic, sclera clear, conjunctiva pink, hearing intact, trachea midline. Lungs- Clear to ausculation bilaterally, normal work of breathing Heart- Regular rhythm, bradycardia, no murmurs, rubs or gallops  GI- soft, NT,  ND, + BS Extremities- no clubbing, cyanosis, or edema MS- no significant deformity or atrophy Skin- no rash or lesion Psych- euthymic mood, full affect Neuro- strength and sensation are intact   Wt Readings from Last 3 Encounters:  12/06/19 112.5 kg  11/29/19 108.9 kg  11/22/19 114.4 kg    EKG today demonstrates SB HR 38, RBBB, NST, PR 146, QRS 156, QTc 453  Echo 10/05/19  1. Left ventricular ejection fraction, by visual estimation, is 65 to 70%. The left ventricle has hyperdynamic function. There is no left ventricular hypertrophy.  2. Left ventricular diastolic function could not be evaluated.  3. Global right ventricle has moderately reduced systolic function.The right ventricular size is normal. No increase in right ventricular wall thickness.  4. Left atrial size was severely dilated.  5. Right atrial size was moderately dilated.  6. The mitral valve is normal in structure. Mild mitral valve regurgitation.  7. The tricuspid valve is normal in structure. Tricuspid valve regurgitation is mild.  8. The aortic valve is normal in structure. Aortic valve regurgitation is not visualized. No evidence of aortic valve sclerosis or stenosis.  9. The pulmonic valve was normal in structure. Pulmonic valve regurgitation is not visualized. 10. Aortic  dilatation noted. 11. 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 12. Normal pulmonary artery systolic pressure. 13. The atrial septum is grossly normal.  Epic records are reviewed at length today  Assessment and Plan:  1. Persistent atrial fibrillation S/p unsuccessful DCCV 10/15/19. S/p repeat DCCV on amiodarone 11/29/19.  Patient appears to be maintaining SR. Continue amiodarone to 200 mg daily. Stop Toprol 25 mg daily Continue Eliquis 5 mg BID with no missed doses for 4 weeks post DCCV.  This patients CHA2DS2-VASc Score and unadjusted Ischemic Stroke Rate (% per year) is equal to 2.2 % stroke rate/year from a score of 2  Above score calculated as 1 point each if present [CHF, HTN, DM, Vascular=MI/PAD/Aortic Plaque, Age if 65-74, or Male] Above score calculated as 2 points each if present [Age > 75, or Stroke/TIA/TE]  2. Obesity Body mass index is 34.59 kg/m. Lifestyle modification was discussed and encouraged including regular physical activity and weight reduction.  3. Snoring The importance of adequate treatment of sleep apnea was discussed today in order to improve our ability to maintain sinus rhythm long term. Sleep study per pulmonary.   4. CAD Coronary artery calcification and aortic atherosclerosis seen on chest CT. No anginal symptoms. Will refer to general cardiology.   5. Bradycardia Patient asymptomatic. No signs of high degree heart block. No dizziness or syncope.  Stop BB as above.  Patient to track pulse rate at home and call clinic with update later this week.   Follow up in the AF clinic in 3 months.   [facility] [address] [place], [place] [address] [phone] 12/06/2019 9:41 AM

## 2019-12-08 ENCOUNTER — Encounter: Payer: Self-pay | Admitting: *Deleted

## 2019-12-09 ENCOUNTER — Ambulatory Visit: Payer: Medicare HMO | Admitting: Cardiology

## 2019-12-09 ENCOUNTER — Telehealth (HOSPITAL_COMMUNITY): Payer: Self-pay | Admitting: *Deleted

## 2019-12-09 MED ORDER — AMIODARONE HCL 200 MG PO TABS: 100.0000 mg | ORAL_TABLET | Freq: Every day | ORAL | 3 refills | Status: DC

## 2019-12-09 NOTE — Telephone Encounter (Signed)
Patient called in with update of HRs since stopping metoprolol. Heart rates continue to run in 40-44 range. Pt continues to feel well. Per Audry Pili Fenton decrease Amiodarone to 100mg  a day and call with update next week of heart rates. Pt verbalized understanding.

## 2019-12-10 ENCOUNTER — Other Ambulatory Visit: Payer: Self-pay

## 2019-12-10 ENCOUNTER — Encounter: Payer: Self-pay | Admitting: Cardiology

## 2019-12-10 ENCOUNTER — Ambulatory Visit (INDEPENDENT_AMBULATORY_CARE_PROVIDER_SITE_OTHER): Payer: Medicare HMO | Admitting: Cardiology

## 2019-12-10 ENCOUNTER — Encounter: Payer: Self-pay | Admitting: *Deleted

## 2019-12-10 VITALS — BP 124/80 | HR 46 | Ht 71.0 in | Wt 247.0 lb

## 2019-12-10 DIAGNOSIS — E782 Mixed hyperlipidemia: Secondary | ICD-10-CM | POA: Diagnosis not present

## 2019-12-10 DIAGNOSIS — R0602 Shortness of breath: Secondary | ICD-10-CM | POA: Diagnosis not present

## 2019-12-10 DIAGNOSIS — E669 Obesity, unspecified: Secondary | ICD-10-CM | POA: Diagnosis not present

## 2019-12-10 DIAGNOSIS — R079 Chest pain, unspecified: Secondary | ICD-10-CM | POA: Insufficient documentation

## 2019-12-10 DIAGNOSIS — I4891 Unspecified atrial fibrillation: Secondary | ICD-10-CM

## 2019-12-10 DIAGNOSIS — Z13220 Encounter for screening for lipoid disorders: Secondary | ICD-10-CM | POA: Diagnosis not present

## 2019-12-10 NOTE — Progress Notes (Signed)
Cardiology Office Note:    Date:  12/10/2019   ID:  Joshua [name], DOB Jul 15, 1946, MRN [id number]  PCP:  Joshua Hams, MD  Cardiologist:  Joshua Salines, DO  Electrophysiologist:  None   Referring MD: Joshua Barre, PA   Chief Complaint  Patient presents with  . Shortness of Breath   History of Present Illness:    Joshua [name] is a 74 y.o. male with a hx of paroxysmal atrial fibrillation (the patient is on amiodarone 200 mg and Eliquis 5 mg twice daily) he is status post successful cardioversion on November 29, 2019 which was discussed and the patient is back [name] sinus rhythm coronary artery calcification seen on chest CT.  The patient is here to establish cardiology care after he was referred by the A. fib clinic.  Today he tells me that after his cardioversion shortness of breath improved but he still his baseline is experiencing some shortness of breath on exertion.  He denies any chest [name].  Records from the A. fib clinic reviewed by me.  Past Medical History:  Diagnosis Date  . Abnormal findings on diagnostic imaging of lung 09/23/2019   09/22/2019-lung cancer screening-centrilobular and paraseptal emphysema, calcified granulomata noted bilaterally, no suspicious nodules, lung RADS 1, repeat in 12 months   . Atrial fibrillation ([place]) 09/23/2019  . Centrilobular emphysema ([place]) 09/23/2019   09/22/2019-lung cancer screening-centrilobular and paraseptal emphysema, calcified granulomata noted bilaterally, no suspicious nodules, lung RADS 1, repeat in 12 months  09/23/2019-FVC 3.74 (81% predicted), postbronchodilator ratio 80, postbronchodilator FEV1 2.88 (86% predicted), no bronchodilator response, DLCO 19.44 (73% predicted)  . DDD (degenerative disc disease), cervical 05/13/2008   Cervical Disc Degeneration  . History of adenomatous polyp of colon 03/03/2018   05/14/17: Colonoscopy: nonadvanced adenoma, f/u 5 yrs, use SuPrep, Murphy/GAP  Last Assessment & Plan:   Relevant Hx: Course: Daily Update: Today's Plan:  Joshua [name] Hyperlipidemia   . Kidney stone   . Nephrolithiasis 05/22/2011  . Nontraumatic incomplete tear of right rotator cuff 10/14/2018  . Other and unspecified hyperlipidemia 11/20/2009   Hyperlipidemia  . RBBB 06/09/2012  . Secondary hypercoagulable state ([place]) 09/28/2019  . Shortness of breath 09/23/2019    Past Surgical History:  Procedure Laterality Date  . CARDIOVERSION N/A 10/15/2019   Procedure: CARDIOVERSION;  Surgeon: Joshua Pain, MD;  Location: [facility] ENDOSCOPY;  Service: Cardiovascular;  Laterality: N/A;  . CARDIOVERSION N/A 11/29/2019   Procedure: CARDIOVERSION;  Surgeon: Joshua Spark, MD;  Location: [facility] ENDOSCOPY;  Service: Cardiovascular;  Laterality: N/A;  . HEMORRHOID SURGERY      Current Medications: Current Meds  Medication Sig  . acetaminophen (TYLENOL) 500 MG tablet Take 1,000 mg by mouth every 6 (six) hours as needed (for [name].).  Joshua [name] amiodarone (PACERONE) 200 MG tablet Take 0.5 tablets (100 mg total) by mouth daily.  Joshua [name] apixaban (ELIQUIS) 5 MG TABS tablet Take 1 tablet (5 mg total) by mouth 2 (two) times daily.  Joshua [name] atorvastatin (LIPITOR) 20 MG tablet Take 1 tablet (20 mg total) by mouth daily.  . divalproex (DEPAKOTE) 250 MG DR tablet Take 750 mg by mouth at bedtime.   Joshua [name] FLUoxetine (PROZAC) 20 MG capsule Take 20 mg by mouth daily.   . hydrOXYzine (ATARAX/VISTARIL) 10 MG tablet Take 10 mg by mouth daily.   Joshua [name] ketoconazole (NIZORAL) 2 % shampoo Apply 1 application topically 2 (two) times a week. Mondays & Thursdays  . tamsulosin (FLOMAX) 0.4 MG CAPS capsule Take 0.4 mg by  mouth daily as needed (KIDNEY STONES).      Allergies:   Venlafaxine, Bupropion, and Zoloft [sertraline hcl]   Social History   Socioeconomic History  . Marital status: Married    Spouse name: Not on file  . Number of children: Not on file  . Years of education: Not on file  . Highest education level: Not on file  Occupational History  . Not on  file  Tobacco Use  . Smoking status: Former Smoker    Packs/day: 2.00    Years: 46.00    Pack years: 92.00    Quit date: 2010    Years since quitting: 11.1  . Smokeless tobacco: Never Used  Substance and Sexual Activity  . Alcohol use: Not Currently    Alcohol/week: 1.0 - 2.0 standard drinks    Types: 1 - 2 Cans of beer per week    Comment: social  . Drug use: No  . Sexual activity: Not on file  Other Topics Concern  . Not on file  Social History Narrative  . Not on file   Social Determinants of Health   Financial Resource Strain:   . Difficulty of Paying Living Expenses: Not on file  Food Insecurity:   . Worried About Charity fundraiser in the Last Year: Not on file  . Ran Out of Food in the Last Year: Not on file  Transportation Needs:   . Lack of Transportation (Medical): Not on file  . Lack of Transportation (Non-Medical): Not on file  Physical Activity:   . Days of Exercise per Week: Not on file  . Minutes of Exercise per Session: Not on file  Stress:   . Feeling of Stress : Not on file  Social Connections:   . Frequency of Communication with Friends and Family: Not on file  . Frequency of Social Gatherings with Friends and Family: Not on file  . Attends Religious Services: Not on file  . Active Member of Clubs or Organizations: Not on file  . Attends Archivist Meetings: Not on file  . Marital Status: Not on file     Family History: The patient's family history includes Stroke in his father; Varicose Veins in his mother.  ROS:   Review of Systems  Constitution: Negative for decreased appetite, fever and weight gain.  HENT: Negative for congestion, ear discharge, hoarse voice and sore throat.   Eyes: Negative for discharge, redness, vision loss in right eye and visual halos.  Cardiovascular: Reports dyspnea on exertion.  Negative for chest [name], leg swelling, orthopnea and palpitations.  Respiratory: Negative for cough, hemoptysis, shortness of  breath and snoring.   Endocrine: Negative for heat intolerance and polyphagia.  Hematologic/Lymphatic: Negative for bleeding problem. Does not bruise/bleed easily.  Skin: Negative for flushing, nail changes, rash and suspicious lesions.  Musculoskeletal: Negative for arthritis, joint [name], muscle cramps, myalgias, neck [name] and stiffness.  Gastrointestinal: Negative for abdominal [name], bowel incontinence, diarrhea and excessive appetite.  Genitourinary: Negative for decreased libido, genital sores and incomplete emptying.  Neurological: Negative for brief paralysis, focal weakness, headaches and loss of balance.  Psychiatric/Behavioral: Negative for altered mental status, depression and suicidal ideas.  Allergic/Immunologic: Negative for HIV exposure and persistent infections.    EKGs/Labs/Other Studies Reviewed:    The following studies were reviewed today:   EKG:  The ekg ordered today demonstrates sinus bradycardia, heart rate 45 beats per bundle branch block.  Transthoracic echocardiogram October 05, 2019 1. Left ventricular ejection fraction, by  visual estimation, is 65 to  70%. The left ventricle has hyperdynamic function. There is no left  ventricular hypertrophy.  2. Left ventricular diastolic function could not be evaluated.  3. Global right ventricle has moderately reduced systolic function.The  right ventricular size is normal. No increase in right ventricular wall  thickness.  4. Left atrial size was severely dilated.  5. Right atrial size was moderately dilated.  6. The mitral valve is normal in structure. Mild mitral valve  regurgitation.  7. The tricuspid valve is normal in structure. Tricuspid valve  regurgitation is mild.  8. The aortic valve is normal in structure. Aortic valve regurgitation is  not visualized. No evidence of aortic valve sclerosis or stenosis.  9. The pulmonic valve was normal in structure. Pulmonic valve  regurgitation is not  visualized.  10. Aortic dilatation noted.  11. 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  12. Normal pulmonary artery systolic pressure.  13. The atrial septum is grossly normal.   Recent Labs: 11/22/2019: ALT 29; BUN 21; Creatinine, Ser 1.39; Hemoglobin 14.4; Platelets 175; Potassium 4.1; Sodium 141; TSH 5.246  Recent Lipid Panel No results found for: CHOL, TRIG, HDL, CHOLHDL, VLDL, LDLCALC, LDLDIRECT  Physical Exam:    VS:  BP 124/80 (BP Location: Left Arm, Patient Position: Sitting, Cuff Size: Normal)   Pulse (!) 46   Ht 5\' 11"  (1.803 m)   Wt 247 lb (112 kg)   SpO2 93%   BMI 34.45 kg/m     Wt Readings from Last 3 Encounters:  12/10/19 247 lb (112 kg)  12/06/19 248 lb (112.5 kg)  11/29/19 240 lb (108.9 kg)    GEN: Well nourished, well developed in no acute distress HEENT: Normal NECK: No JVD; No carotid bruits LYMPHATICS: No lymphadenopathy CARDIAC: S1S2 noted,RRR, no murmurs, rubs, gallops RESPIRATORY:  Clear to auscultation without rales, wheezing or rhonchi  ABDOMEN: Soft, non-tender, non-distended, +bowel sounds, no guarding. EXTREMITIES: No edema, No cyanosis, no clubbing MUSCULOSKELETAL:  No deformity  SKIN: Warm and dry NEUROLOGIC:  Alert and oriented x 3, non-focal PSYCHIATRIC:  Normal affect, good insight  ASSESSMENT:    1. Shortness of breath   2. Atrial fibrillation, unspecified type ([place])   3. Mixed hyperlipidemia   4. Chest [name] of uncertain etiology   5. Obesity (BMI 30-39.9)    PLAN:    His shortness of breath is concerning and given his risk factors with pharmacologic nuclear stress test. I have educated the patient about this testing and he is agreeable to proceed.  Hyperlipidemia continue Lipitor 20 mg we will repeat his lipids.  Sinus bradycardia is asymptomatic we will continue his current medical regimen at this time.  Paroxysmal atrial fibrillation-the patient follows with the A. fib clinic we will continue him  on his amiodarone 200 mg as well as his Eliquis 5 mg twice daily.  The patient is in agreement with the above plan. The patient left the office in stable condition.  The patient will follow up in 3 months or sooner if needed.   Medication Adjustments/Labs and Tests Ordered: Current medicines are reviewed at length with the patient today.  Concerns regarding medicines are outlined above.  Orders Placed This Encounter  Procedures  . Lipid Profile  . MYOCARDIAL PERFUSION IMAGING  . EKG 12-Lead   No orders of the defined types were placed in this encounter.   Patient Instructions  Medication Instructions:  Your physician has recommended you make the following change in  your medication:   TAKE: Over The Counter Co -Enzyme Q10 as directed on bottle  *If you need a refill on your cardiac medications before your next appointment, please call your pharmacy*  Lab Work: Your physician recommends that you return for lab work in: [place]  If you have labs (blood work) drawn today and your tests are completely normal, you will receive your results only by: Joshua [name] MyChart Message (if you have MyChart) OR . A paper copy in the mail If you have any lab test that is abnormal or we need to change your treatment, we will call you to review the results.  Testing/Procedures: Your physician has requested that you have a lexiscan myoview. For further information please visit [email or web address]. Please follow instruction sheet, as given.   Follow-Up: At [facility], you and your health needs are our priority.  As part of our continuing mission to provide you with exceptional heart care, we have created designated Provider Care Teams.  These Care Teams include your primary Cardiologist (physician) and Advanced Practice Providers (APPs -  Physician Assistants and Nurse Practitioners) who all work together to provide you with the care you need, when you need it.  Your next appointment:   3 month(s)   The format for your next appointment:   In Person  Provider:   Berniece Salines, DO  Other Instructions      Adopting a Healthy Lifestyle.  Know what a healthy weight is for you (roughly BMI <25) and aim to maintain this   Aim for 7+ servings of fruits and vegetables daily   65-80+ fluid ounces of water or unsweet tea for healthy kidneys   Limit to max 1 drink of alcohol per day; avoid smoking/tobacco   Limit animal fats in diet for cholesterol and heart health - choose grass fed whenever available   Avoid highly processed foods, and foods high in saturated/trans fats   Aim for low stress - take time to unwind and care for your mental health   Aim for 150 min of moderate intensity exercise weekly for heart health, and weights twice weekly for bone health   Aim for 7-9 hours of sleep daily   When it comes to diets, agreement about the perfect plan isnt easy to find, even among the experts. Experts at the [place] developed an idea known as the Healthy Eating Plate. Just imagine a plate divided into logical, healthy portions.   The emphasis is on diet quality:   Load up on vegetables and fruits - one-half of your plate: Aim for color and variety, and remember that potatoes dont count.   Go for whole grains - one-quarter of your plate: Whole wheat, barley, wheat berries, quinoa, oats, brown rice, and foods made with them. If you want pasta, go with whole wheat pasta.   Protein power - one-quarter of your plate: Fish, chicken, beans, and nuts are all healthy, versatile protein sources. Limit red meat.   The diet, however, does go beyond the plate, offering a few other suggestions.   Use healthy plant oils, such as olive, canola, soy, corn, sunflower and peanut. Check the labels, and avoid partially hydrogenated oil, which have unhealthy trans fats.   If youre thirsty, drink water. Coffee and tea are good in moderation, but skip sugary drinks and limit  milk and dairy products to one or two daily servings.   The type of carbohydrate in the diet is more important than the amount. Some  sources of carbohydrates, such as vegetables, fruits, whole grains, and beans-are healthier than others.   Finally, stay active  Signed, Joshua Salines, DO  12/10/2019 11:30 AM    [place]

## 2019-12-10 NOTE — Patient Instructions (Signed)
Medication Instructions:  Your physician has recommended you make the following change in your medication:   TAKE: Over The Counter Co -Enzyme Q10 as directed on bottle  *If you need a refill on your cardiac medications before your next appointment, please call your pharmacy*  Lab Work: Your physician recommends that you return for lab work in: [place]  If you have labs (blood work) drawn today and your tests are completely normal, you will receive your results only by: Marland Kitchen MyChart Message (if you have MyChart) OR . A paper copy in the mail If you have any lab test that is abnormal or we need to change your treatment, we will call you to review the results.  Testing/Procedures: Your physician has requested that you have a lexiscan myoview. For further information please visit [email or web address]. Please follow instruction sheet, as given.   Follow-Up: At [facility], you and your health needs are our priority.  As part of our continuing mission to provide you with exceptional heart care, we have created designated Provider Care Teams.  These Care Teams include your primary Cardiologist (physician) and Advanced Practice Providers (APPs -  Physician Assistants and Nurse Practitioners) who all work together to provide you with the care you need, when you need it.  Your next appointment:   3 month(s)  The format for your next appointment:   In Person  Provider:   Berniece Salines, DO  Other Instructions

## 2019-12-11 LAB — LIPID PANEL
Chol/HDL Ratio: 3 ratio (ref 0.0–5.0)
Cholesterol, Total: 139 mg/dL (ref 100–199)
HDL: 47 mg/dL (ref >39)
LDL Chol Calc (NIH): 73 mg/dL (ref 0–99)
Triglycerides: 100 mg/dL (ref 0–149)
VLDL Cholesterol Cal: 19 mg/dL (ref 5–40)

## 2019-12-13 ENCOUNTER — Telehealth: Payer: Self-pay | Admitting: Cardiology

## 2019-12-13 NOTE — Telephone Encounter (Signed)
PT AWARE OF LAB RESULTS./CY 

## 2019-12-13 NOTE — Telephone Encounter (Signed)
Patient is returning Christine's call in regards to lab results. States you can leave a detailed VM in case he does not answer. Or, you can send him an email, which may be the best way, to [email or web address]

## 2019-12-14 ENCOUNTER — Telehealth (HOSPITAL_COMMUNITY): Payer: Self-pay | Admitting: *Deleted

## 2019-12-14 NOTE — Telephone Encounter (Signed)
Left message on voicemail per DPR in reference to upcoming appointment scheduled on 05/18/20 at 7:30 with detailed instructions given per Myocardial Perfusion Study Information Sheet for the test. LM to arrive 15 minutes early, and that it is imperative to arrive on time for appointment to keep from having the test rescheduled. If you need to cancel or reschedule your appointment, please call the office within 24 hours of your appointment. Failure to do so may result in a cancellation of your appointment, and a $50 no show fee. Phone number given for call back for any questions.

## 2019-12-15 NOTE — Telephone Encounter (Signed)
HR ranging from 50-76 per Adline Peals PA continue current medication regimen. Pt in agreement.

## 2019-12-20 ENCOUNTER — Other Ambulatory Visit: Payer: Self-pay

## 2019-12-20 ENCOUNTER — Ambulatory Visit (HOSPITAL_COMMUNITY): Payer: Medicare HMO | Attending: Cardiovascular Disease

## 2019-12-20 VITALS — Ht 71.0 in | Wt 247.0 lb

## 2019-12-20 DIAGNOSIS — R0602 Shortness of breath: Secondary | ICD-10-CM

## 2019-12-20 DIAGNOSIS — R079 Chest pain, unspecified: Secondary | ICD-10-CM

## 2019-12-20 DIAGNOSIS — I4891 Unspecified atrial fibrillation: Secondary | ICD-10-CM | POA: Diagnosis not present

## 2019-12-20 LAB — MYOCARDIAL PERFUSION IMAGING
LV dias vol: 122 mL (ref 62–150)
LV sys vol: 58 mL
Peak HR: 64 {beats}/min
Rest HR: 45 {beats}/min
SDS: 1
SSS: 1
TID: 1.03

## 2019-12-20 MED ORDER — REGADENOSON 0.4 MG/5ML IV SOLN
0.4000 mg | Freq: Once | INTRAVENOUS | Status: AC
  Administered 2019-12-20: 0.4 mg via INTRAVENOUS

## 2019-12-20 MED ORDER — TECHNETIUM TC 99M TETROFOSMIN IV KIT
10.4000 | PACK | Freq: Once | INTRAVENOUS | Status: AC | PRN
  Administered 2019-12-20: 10.4 via INTRAVENOUS
  Filled 2019-12-20: qty 11

## 2019-12-20 MED ORDER — TECHNETIUM TC 99M TETROFOSMIN IV KIT
32.6000 | PACK | Freq: Once | INTRAVENOUS | Status: AC | PRN
  Administered 2019-12-20: 32.6 via INTRAVENOUS
  Filled 2019-12-20: qty 33

## 2019-12-23 ENCOUNTER — Ambulatory Visit: Payer: Medicare HMO | Admitting: Pulmonary Disease

## 2020-01-03 ENCOUNTER — Other Ambulatory Visit: Payer: Self-pay

## 2020-01-03 ENCOUNTER — Encounter: Payer: Self-pay | Admitting: Family Medicine

## 2020-01-03 ENCOUNTER — Ambulatory Visit: Payer: Medicare HMO | Admitting: Family Medicine

## 2020-01-03 VITALS — BP 120/78 | HR 55 | Ht 71.0 in | Wt 246.8 lb

## 2020-01-03 DIAGNOSIS — R251 Tremor, unspecified: Secondary | ICD-10-CM | POA: Diagnosis not present

## 2020-01-03 MED ORDER — PRIMIDONE 50 MG PO TABS: ORAL_TABLET | ORAL | 1 refills | Status: DC

## 2020-01-03 NOTE — Progress Notes (Signed)
   I, Wendy Poet, LAT, ATC, am serving as scribe for Dr. [name].  Joshua [name] is a 74 y.o. male (RHD) who presents to [place] [facility] today for discussion of neuro issues.  Pt has a worsening L hand tremor and a new tremor in his R hand.  He is also noting a change in his handwriting.  He is having some balance difficulties and is having some changes in his speech.  His wife is also reporting some involuntary movements at night when in bed.   Numbness/tingling: No Pain: No Weakness: No  Aggravating factors: Tremor seems worse when trying to focus/concentrate on picking something up w/ his hands Treatments tried: None   Pertinent review of systems: No fevers or chills.  No loss of function.  Relevant historical information: No personal or family history of essential tremor or Parkinson's disease.  Emphysema and atrial fibrillation currently managed.   Exam:  BP 120/78 (BP Location: Left Arm, Patient Position: Sitting, Cuff Size: Large)   Pulse (!) 55   Ht 5\' 11"  (1.803 m)   Wt 246 lb 12.8 oz (111.9 kg)   SpO2 95%   BMI 34.42 kg/m  General: Well Developed, well nourished, and in no acute distress.   Neuro: Alert and oriented Slight tremor at rest left hand worse than right.  Tremor worsens with motion. Normal coordination.  No ataxia present. Strength is intact. Gait slight broad-based with en bloc turn. Tremor does not change with gait. Slight cogwheeling present bilateral upper extremities.      Assessment and Plan: 74 y.o. male with tremor ongoing for about a year now.  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  He may have some Parkinson's components as well however essential tremor seems to be the dominant finding.  After discussion with family plan to start trial of primidone titration.  Additionally will refer to neurology to further characterize and treat tremor. Discussed titration of medication and possible drug  interactions. Additionally recommend patient establish care with new PCP.  Recommend Dr. [name] at [facility].   PDMP not reviewed this encounter. Orders Placed This Encounter  Procedures  . Ambulatory referral to Neurology    Referral Priority:   Routine    Referral Type:   Consultation    Referral Reason:   Specialty Services Required    Requested Specialty:   Neurology    Number of Visits Requested:   1   Meds ordered this encounter  Medications  . primidone (MYSOLINE) 50 MG tablet    Sig: Take 1 tablet (50 mg total) by mouth at bedtime for 3 days, THEN 2 tablets (100 mg total) at bedtime for 3 days, THEN 3 tablets (150 mg total) at bedtime for 3 days, THEN 4 tablets (200 mg total) at bedtime for 3 days, THEN 5 tablets (250 mg total) at bedtime for 21 days.    Dispense:  135 tablet    Refill:  1     Discussed warning signs or symptoms. Please see discharge instructions. Patient expresses understanding.   The above documentation has been reviewed and is accurate and complete [name]   Total encounter time 30 minutes including charting time date of service.

## 2020-01-03 NOTE — Patient Instructions (Signed)
Thank you for coming in today. I think you have essential tremor.  There may be some Parkinson like symptoms.  Plan to start Primidone at bedtime.  Increase by 1 pill every 3 days until you either have an effective dose or you get to 250mg .  I have also referred you to neurology.  You should hear soon about an appointment.  I do recommend that you establish care with Dr [name] at [facility]   Essential Tremor A tremor is trembling or shaking that a person cannot control. Most tremors affect the hands or arms. Tremors can also affect the head, vocal cords, legs, and other parts of the body. Essential tremor is a tremor without a known cause. Usually, it occurs while a person is trying to perform an action. It tends to get worse gradually as a person ages. What are the causes? The cause of this condition is not known. What increases the risk? You are more likely to develop this condition if:  You have a family member with essential tremor.  You are age 72 or older.  You take certain medicines. What are the signs or symptoms? The main sign of a tremor is a rhythmic shaking of certain parts of your body that is uncontrolled and unintentional. You may:  Have difficulty eating with a spoon or fork.  Have difficulty writing.  Nod your head up and down or side to side.  Have a quivering voice. The shaking may:  Get worse over time.  Come and go.  Be more noticeable on one side of your body.  Get worse due to stress, fatigue, caffeine, and extreme heat or cold. How is this diagnosed? This condition may be diagnosed based on:  Your symptoms and medical history.  A physical exam. There is no single test to diagnose an essential tremor. However, your health care provider may order tests to rule out other causes of your condition. These may include:  Blood and urine tests.  Imaging studies of your brain, such as CT scan and MRI.  A test that  measures involuntary muscle movement (electromyogram). How is this treated? Treatment for essential tremor depends on the severity of the condition.  Some tremors may go away without treatment.  Mild tremors may not need treatment if they do not affect your day-to-day life.  Severe tremors may need to be treated using one or more of the following options: ? Medicines. ? Lifestyle changes. ? Occupational or physical therapy. Follow these instructions at home: Lifestyle   Do not use any products that contain nicotine or tobacco, such as cigarettes and e-cigarettes. If you need help quitting, ask your health care provider.  Limit your caffeine intake as told by your health care provider.  Try to get 8 hours of sleep each night.  Find ways to manage your stress that fits your lifestyle and personality. Consider trying meditation or yoga.  Try to anticipate stressful situations and allow extra time to manage them.  If you are struggling emotionally with the effects of your tremor, consider working with a mental health provider. General instructions  Take over-the-counter and prescription medicines only as told by your health care provider.  Avoid extreme heat and extreme cold.  Keep all follow-up visits as told by your health care provider. This is important. Visits may include physical therapy visits. Contact a health care provider if:  You experience any changes in the location or intensity of your tremors.  You start having a  tremor after starting a new medicine.  You have tremor with other symptoms, such as: ? Numbness. ? Tingling. ? Pain. ? Weakness.  Your tremor gets worse.  Your tremor interferes with your daily life.  You feel down, blue, or sad for at least 2 weeks in a row.  Worrying about your tremor and what other people think about you interferes with your everyday life functions, including relationships, work, or school. Summary  Essential tremor is a  tremor without a known cause. Usually, it occurs when you are trying to perform an action.  The cause of this condition is not known.  The main sign of a tremor is a rhythmic shaking of certain parts of your body that is uncontrolled and unintentional.  Treatment for essential tremor depends on the severity of the condition. This information is not intended to replace advice given to you by your health care provider. Make sure you discuss any questions you have with your health care provider. Document Revised: 10/31/2017 Document Reviewed: 10/31/2017 Elsevier Patient Education  [place].   Primidone tablets What is this medicine? PRIMIDONE (PRI mi done) is a barbiturate. This medicine is used to control seizures in certain types of epilepsy. It is not for use in absence (petit mal) seizures. This medicine may be used for other purposes; ask your health care provider or pharmacist if you have questions. COMMON BRAND NAME(S): Mysoline What should I tell my health care provider before I take this medicine? They need to know if you have any of these conditions:  kidney disease  liver disease  porphyria  suicidal thoughts, plans, or attempt; a previous suicide attempt by you or a family member  an unusual or allergic reaction to primidone, phenobarbital, other barbiturates or seizure medications, other medicines, foods, dyes, or preservatives  pregnant or trying to get pregnant  breast-feeding How should I use this medicine? Take this medicine by mouth with a glass of water. Follow the directions on the prescription label. Take your doses at regular intervals. Do not take your medicine more often than directed. Do not stop taking except on the advice of your doctor or health care professional. A special MedGuide will be given to you by the pharmacist with each prescription and refill. Be sure to read this information carefully each time. Contact your pediatrician or health  care professional regarding the use of this medication in children. Special care may be needed. While this drug may be prescribed for children for selected conditions, precautions do apply. Overdosage: If you think you have taken too much of this medicine contact a poison control center or emergency room at once. NOTE: This medicine is only for you. Do not share this medicine with others. What if I miss a dose? If you miss a dose, take it as soon as you can. If it is almost time for your next dose, take only that dose. Do not take double or extra doses. What may interact with this medicine? Do not take this medicine with any of the following medications:  voriconazole This medicine may also interact with the following medications:  cancer-treating medications  cyclosporine  disopyramide  doxycycline  male hormones, including contraceptive or birth control pills  medicines for mental depression, anxiety or other mood problems  medicines for treating HIV infection or AIDS  modafinil  prescription pain medications  quinidine  warfarin This list may not describe all possible interactions. Give your health care provider a list of all the medicines, herbs,  non-prescription drugs, or dietary supplements you use. Also tell them if you smoke, drink alcohol, or use illegal drugs. Some items may interact with your medicine. What should I watch for while using this medicine? Visit your doctor or health care professional for regular checks on your progress. It may be 2 to 3 weeks before you see the full effects of this medicine. Do not suddenly stop taking this medicine, you may increase the risk of seizures. Your doctor or health care professional may want to gradually reduce the dose. Wear a medical identification bracelet or chain to say you have epilepsy, and carry a card that lists all your medications. You may get drowsy or dizzy. Do not drive, use machinery, or do anything that needs  mental alertness until you know how this medicine affects you. Do not stand or sit up quickly, especially if you are an older patient. This reduces the risk of dizzy or fainting spells. Alcohol may interfere with the effect of this medicine. Avoid alcoholic drinks. Birth control pills may not work properly while you are taking this medicine. Talk to your doctor about using an extra method of birth control. The use of this medicine may increase the chance of suicidal thoughts or actions. Pay special attention to how you are responding while on this medicine. Any worsening of mood, or thoughts of suicide or dying should be reported to your health care professional right away. Women who become pregnant while using this medicine may enroll in the [employer] Pregnancy Registry by calling [phone]. This registry collects information about the safety of antiepileptic drug use during pregnancy. This medicine may cause a decrease in vitamin D and folic acid. You should make sure that you get enough vitamins while you are taking this medicine. Discuss the foods you eat and the vitamins you take with your health care professional. What side effects may I notice from receiving this medicine? Side effects that you should report to your doctor or health care professional as soon as possible:  allergic reactions like skin rash, itching or hives, swelling of the face, lips, or tongue  blurred, double vision, or uncontrollable rolling or movements of the eyes  redness, blistering, peeling or loosening of the skin, including inside the mouth  shortness of breath or difficulty breathing  unusual excitement or restlessness, more likely in children and the elderly  unusually weak or tired  worsening of mood, thoughts or actions of suicide or dying Side effects that usually do not require medical attention (report to your doctor or health care professional if they continue or are  bothersome):  clumsiness, unsteadiness, or a hang-over effect  decreased sexual ability  dizziness, drowsiness  loss of appetite  nausea or vomiting This list may not describe all possible side effects. Call your doctor for medical advice about side effects. You may report side effects to FDA at 1-800-FDA-1088. Where should I keep my medicine? Keep out of the reach of children. This medicine may cause accidental overdose and death if it taken by other adults, children, or pets. Mix any unused medicine with a substance like cat litter or coffee grounds. Then throw the medicine away in a sealed container like a sealed bag or a coffee can with a lid. Do not use the medicine after the expiration date. Store at room temperature between 15 and 30 degrees C (59 and 86 degrees F). NOTE: This sheet is a summary. It may not cover all possible information. If you have  questions about this medicine, talk to your doctor, pharmacist, or health care provider.  2020 Elsevier/Gold Standard (2017-06-04 15:21:47)

## 2020-01-07 ENCOUNTER — Telehealth: Payer: Self-pay | Admitting: Pulmonary Disease

## 2020-01-07 DIAGNOSIS — R42 Dizziness and giddiness: Secondary | ICD-10-CM | POA: Diagnosis not present

## 2020-01-07 DIAGNOSIS — Z87891 Personal history of nicotine dependence: Secondary | ICD-10-CM | POA: Diagnosis not present

## 2020-01-07 DIAGNOSIS — E785 Hyperlipidemia, unspecified: Secondary | ICD-10-CM | POA: Diagnosis not present

## 2020-01-07 DIAGNOSIS — Z7901 Long term (current) use of anticoagulants: Secondary | ICD-10-CM | POA: Diagnosis not present

## 2020-01-07 DIAGNOSIS — R531 Weakness: Secondary | ICD-10-CM | POA: Diagnosis not present

## 2020-01-07 DIAGNOSIS — G4733 Obstructive sleep apnea (adult) (pediatric): Secondary | ICD-10-CM

## 2020-01-07 DIAGNOSIS — Z888 Allergy status to other drugs, medicaments and biological substances status: Secondary | ICD-10-CM | POA: Diagnosis not present

## 2020-01-07 DIAGNOSIS — R001 Bradycardia, unspecified: Secondary | ICD-10-CM | POA: Diagnosis not present

## 2020-01-07 DIAGNOSIS — S0083XA Contusion of other part of head, initial encounter: Secondary | ICD-10-CM | POA: Diagnosis not present

## 2020-01-07 DIAGNOSIS — R4182 Altered mental status, unspecified: Secondary | ICD-10-CM | POA: Diagnosis not present

## 2020-01-07 DIAGNOSIS — Z79899 Other long term (current) drug therapy: Secondary | ICD-10-CM | POA: Diagnosis not present

## 2020-01-07 DIAGNOSIS — I4891 Unspecified atrial fibrillation: Secondary | ICD-10-CM | POA: Diagnosis not present

## 2020-01-07 NOTE — Telephone Encounter (Signed)
Per Dr. Ander Slade:  Home sleep study from 12/06/2019 shows moderate OSA. Recommend CPAP with pressure settings of 5-15cm. Close clinical follow with compliance monitoring to optimize therapeutic efficiency. Weight loss measures encouraged. ------------------------------------------------------- Spoke with pt. He is aware of results. Order has been placed for CPAP. Nothing further was needed.

## 2020-01-10 ENCOUNTER — Telehealth: Payer: Self-pay | Admitting: Family Medicine

## 2020-01-10 NOTE — Telephone Encounter (Signed)
Pt called back, advised of Dr. [name] response. Pt understood and is feeling well. Scheduled to see Neuro 01/21/2020.

## 2020-01-10 NOTE — Telephone Encounter (Signed)
Pt had a fall this weekend, went to ED. No injuries, CT done. Wife wanted to be sure that it was OK for [place] to continue Primidone, he has not had any side affects and does thinks the fall was related to general balance issues. She called [facility] and left a message about his referral.  Just wanted to be sure you did not need to see him and that you knew about this fall.

## 2020-01-10 NOTE — Telephone Encounter (Signed)
Yes it is probably okay to continue taking the primidone however happy to see you back in clinic at any time if you would like.

## 2020-01-17 ENCOUNTER — Ambulatory Visit: Payer: Medicare HMO | Admitting: Pulmonary Disease

## 2020-01-17 ENCOUNTER — Encounter: Payer: Self-pay | Admitting: Pulmonary Disease

## 2020-01-17 ENCOUNTER — Other Ambulatory Visit: Payer: Self-pay

## 2020-01-17 ENCOUNTER — Other Ambulatory Visit (HOSPITAL_COMMUNITY): Payer: Self-pay | Admitting: Physician Assistant

## 2020-01-17 VITALS — BP 126/70 | HR 85 | Temp 97.7°F | Ht 70.0 in | Wt 248.0 lb

## 2020-01-17 DIAGNOSIS — G4733 Obstructive sleep apnea (adult) (pediatric): Secondary | ICD-10-CM

## 2020-01-17 NOTE — Progress Notes (Signed)
Joshua [name]    [id number]    [birth year]  Primary Care Physician:Corey, Rebekah Chesterfield, MD  Referring Physician: Gregor Hams, MD [place],  [place] [address]  Chief complaint:   Patient is being seen for obstructive sleep apnea  HPI:  Recently had a sleep study showing moderate obstructive sleep apnea  He does admit to snoring Witnessed apnea Spouse will nodule to rollover from snoring  He does kick around a lot at night Symptoms not consistent with restless legs-denies creepy crawly's, denies improvement with moving around  Usually tries to go to bed between 12 and 2 AM Takes him about 5 minutes to fall asleep Final awakening time about 9 AM Wakes up about 0-2 times during the night to use the bathroom  No dryness of his mouth in the morning Memory is good-long-term memory is excellent, short-term memory can be spotty No headaches in the mornings  Unaware of any family history of obstructive sleep apnea   Outpatient Encounter Medications as of 01/17/2020  Medication Sig  . acetaminophen (TYLENOL) 500 MG tablet Take 1,000 mg by mouth every 6 (six) hours as needed (for pain.).  Marland Kitchen amiodarone (PACERONE) 200 MG tablet Take 0.5 tablets (100 mg total) by mouth daily.  Marland Kitchen atorvastatin (LIPITOR) 20 MG tablet Take 1 tablet (20 mg total) by mouth daily.  . divalproex (DEPAKOTE) 250 MG DR tablet Take 750 mg by mouth at bedtime.   Marland Kitchen FLUoxetine (PROZAC) 20 MG capsule Take 20 mg by mouth daily.   . hydrOXYzine (ATARAX/VISTARIL) 10 MG tablet Take 10 mg by mouth daily.   Marland Kitchen ketoconazole (NIZORAL) 2 % shampoo Apply 1 application topically 2 (two) times a week. Mondays & Thursdays  . primidone (MYSOLINE) 50 MG tablet Take 1 tablet (50 mg total) by mouth at bedtime for 3 days, THEN 2 tablets (100 mg total) at bedtime for 3 days, THEN 3 tablets (150 mg total) at bedtime for 3 days, THEN 4 tablets (200 mg total) at bedtime for 3 days, THEN 5 tablets (250 mg total) at  bedtime for 21 days.  . tamsulosin (FLOMAX) 0.4 MG CAPS capsule Take 0.4 mg by mouth daily as needed (KIDNEY STONES).   Marland Kitchen apixaban (ELIQUIS) 5 MG TABS tablet Take 1 tablet (5 mg total) by mouth 2 (two) times daily.   No facility-administered encounter medications on file as of 01/17/2020.    Allergies as of 01/17/2020 - Review Complete 01/17/2020  Allergen Reaction Noted  . Venlafaxine Palpitations 07/08/2013  . Bupropion Other (See Comments) 05/14/2011  . Zoloft [sertraline hcl] Other (See Comments) 05/14/2011    Past Medical History:  Diagnosis Date  . Abnormal findings on diagnostic imaging of lung 09/23/2019   09/22/2019-lung cancer screening-centrilobular and paraseptal emphysema, calcified granulomata noted bilaterally, no suspicious nodules, lung RADS 1, repeat in 12 months   . Atrial fibrillation ([place]) 09/23/2019  . Centrilobular emphysema ([place]) 09/23/2019   09/22/2019-lung cancer screening-centrilobular and paraseptal emphysema, calcified granulomata noted bilaterally, no suspicious nodules, lung RADS 1, repeat in 12 months  09/23/2019-FVC 3.74 (81% predicted), postbronchodilator ratio 80, postbronchodilator FEV1 2.88 (86% predicted), no bronchodilator response, DLCO 19.44 (73% predicted)  . DDD (degenerative disc disease), cervical 05/13/2008   Cervical Disc Degeneration  . History of adenomatous polyp of colon 03/03/2018   05/14/17: Colonoscopy: nonadvanced adenoma, f/u 5 yrs, use SuPrep, [facility]/GAP  Last Assessment & Plan:  Relevant Hx: Course: Daily Update: Today's Plan:  Marland Kitchen Hyperlipidemia   .  Kidney stone   . Nephrolithiasis 05/22/2011  . Nontraumatic incomplete tear of right rotator cuff 10/14/2018  . Other and unspecified hyperlipidemia 11/20/2009   Hyperlipidemia  . RBBB 06/09/2012  . Secondary hypercoagulable state ([place]) 09/28/2019  . Shortness of breath 09/23/2019    Past Surgical History:  Procedure Laterality Date  . CARDIOVERSION N/A 10/15/2019   Procedure:  CARDIOVERSION;  Surgeon: Jerline Pain, MD;  Location: [facility] ENDOSCOPY;  Service: Cardiovascular;  Laterality: N/A;  . CARDIOVERSION N/A 11/29/2019   Procedure: CARDIOVERSION;  Surgeon: Dorothy Spark, MD;  Location: [facility] ENDOSCOPY;  Service: Cardiovascular;  Laterality: N/A;  . HEMORRHOID SURGERY      Family History  Problem Relation Age of Onset  . Varicose Veins Mother   . Stroke Father     Social History   Socioeconomic History  . Marital status: Married    Spouse name: Not on file  . Number of children: Not on file  . Years of education: Not on file  . Highest education level: Not on file  Occupational History  . Not on file  Tobacco Use  . Smoking status: Former Smoker    Packs/day: 2.00    Years: 46.00    Pack years: 92.00    Quit date: 2010    Years since quitting: 11.2  . Smokeless tobacco: Never Used  Substance and Sexual Activity  . Alcohol use: Not Currently    Alcohol/week: 1.0 - 2.0 standard drinks    Types: 1 - 2 Cans of beer per week    Comment: social  . Drug use: No  . Sexual activity: Not on file  Other Topics Concern  . Not on file  Social History Narrative  . Not on file   Social Determinants of Health   Financial Resource Strain:   . Difficulty of Paying Living Expenses:   Food Insecurity:   . Worried About Charity fundraiser in the Last Year:   . Arboriculturist in the Last Year:   Transportation Needs:   . Film/video editor (Medical):   Marland Kitchen Lack of Transportation (Non-Medical):   Physical Activity:   . Days of Exercise per Week:   . Minutes of Exercise per Session:   Stress:   . Feeling of Stress :   Social Connections:   . Frequency of Communication with Friends and Family:   . Frequency of Social Gatherings with Friends and Family:   . Attends Religious Services:   . Active Member of Clubs or Organizations:   . Attends Archivist Meetings:   Marland Kitchen Marital Status:   Intimate Partner Violence:   . Fear of Current or  Ex-Partner:   . Emotionally Abused:   Marland Kitchen Physically Abused:   . Sexually Abused:     Review of Systems  Respiratory: Positive for apnea.   Psychiatric/Behavioral: Positive for sleep disturbance.    Vitals:   01/17/20 1147  BP: 126/70  Pulse: 85  Temp: 97.7 F (36.5 C)  SpO2: 96%   Results of the Epworth flowsheet 01/17/2020  Sitting and reading 2  Watching TV 2  Sitting, inactive in a public place (e.g. a theatre or a meeting) 2  As a passenger in a car for an hour without a break 3  Lying down to rest in the afternoon when circumstances permit 3  Sitting and talking to someone 0  Sitting quietly after a lunch without alcohol 1  In a car, while stopped for a few minutes  in traffic 0  Total score 13     Physical Exam  Constitutional: He appears well-developed and well-nourished.  HENT:  Head: Normocephalic and atraumatic.  Crowded oropharynx, Mallampati 3  Eyes: Pupils are equal, round, and reactive to light. Conjunctivae and EOM are normal. Right eye exhibits no discharge.  Neck: No thyromegaly present.  Cardiovascular: Normal rate and regular rhythm.  Pulmonary/Chest: Effort normal and breath sounds normal. No respiratory distress. He has no wheezes. He has no rales. He exhibits no tenderness.  Musculoskeletal:        General: No edema. Normal range of motion.     Cervical back: Normal range of motion and neck supple.  Neurological: He is alert.  Skin: Skin is warm and dry.  Psychiatric: He has a normal mood and affect.   Data Reviewed: Recent sleep study significant for moderate obstructive sleep apnea  Assessment:  Moderate obstructive sleep apnea  Obesity  Excessive daytime sleepiness  Plan/Recommendations: We will initiate CPAP CPAP setting 5-15  Encouraged to continue working on weight loss efforts  I will follow-up with him in about 3 months  Encouraged to call with any significant concerns   Sherrilyn Rist MD [facility] 01/17/2020, 12:23 PM  CC: Gregor Hams, MD

## 2020-01-17 NOTE — Patient Instructions (Signed)
Obstructive sleep apnea  We will start you on CPAP treatment  I will see you back in about 3 months  If after starting CPAP and getting used to it, you are still having a lot of leg movement, we can try some medications to help with the leg movements   Call with any significant concerns   Sleep Apnea Sleep apnea is a condition in which breathing pauses or becomes shallow during sleep. Episodes of sleep apnea usually last 10 seconds or longer, and they may occur as many as 20 times an hour. Sleep apnea disrupts your sleep and keeps your body from getting the rest that it needs. This condition can increase your risk of certain health problems, including:  Heart attack.  Stroke.  Obesity.  Diabetes.  Heart failure.  Irregular heartbeat. What are the causes? There are three kinds of sleep apnea:  Obstructive sleep apnea. This kind is caused by a blocked or collapsed airway.  Central sleep apnea. This kind happens when the part of the brain that controls breathing does not send the correct signals to the muscles that control breathing.  Mixed sleep apnea. This is a combination of obstructive and central sleep apnea. The most common cause of this condition is a collapsed or blocked airway. An airway can collapse or become blocked if:  Your throat muscles are abnormally relaxed.  Your tongue and tonsils are larger than normal.  You are overweight.  Your airway is smaller than normal. What increases the risk? You are more likely to develop this condition if you:  Are overweight.  Smoke.  Have a smaller than normal airway.  Are elderly.  Are male.  Drink alcohol.  Take sedatives or tranquilizers.  Have a family history of sleep apnea. What are the signs or symptoms? Symptoms of this condition include:  Trouble staying asleep.  Daytime sleepiness and tiredness.  Irritability.  Loud snoring.  Morning headaches.  Trouble  concentrating.  Forgetfulness.  Decreased interest in sex.  Unexplained sleepiness.  Mood swings.  Personality changes.  Feelings of depression.  Waking up often during the night to urinate.  Dry mouth.  Sore throat. How is this diagnosed? This condition may be diagnosed with:  A medical history.  A physical exam.  A series of tests that are done while you are sleeping (sleep study). These tests are usually done in a sleep lab, but they may also be done at home. How is this treated? Treatment for this condition aims to restore normal breathing and to ease symptoms during sleep. It may involve managing health issues that can affect breathing, such as high blood pressure or obesity. Treatment may include:  Sleeping on your side.  Using a decongestant if you have nasal congestion.  Avoiding the use of depressants, including alcohol, sedatives, and narcotics.  Losing weight if you are overweight.  Making changes to your diet.  Quitting smoking.  Using a device to open your airway while you sleep, such as: ? An oral appliance. This is a custom-made mouthpiece that shifts your lower jaw forward. ? A continuous positive airway pressure (CPAP) device. This device blows air through a mask when you breathe out (exhale). ? A nasal expiratory positive airway pressure (EPAP) device. This device has valves that you put into each nostril. ? A bi-level positive airway pressure (BPAP) device. This device blows air through a mask when you breathe in (inhale) and breathe out (exhale).  Having surgery if other treatments do not work. During surgery,  excess tissue is removed to create a wider airway. It is important to get treatment for sleep apnea. Without treatment, this condition can lead to:  High blood pressure.  Coronary artery disease.  In men, an inability to achieve or maintain an erection (impotence).  Reduced thinking abilities. Follow these instructions at  home: Lifestyle  Make any lifestyle changes that your health care provider recommends.  Eat a healthy, well-balanced diet.  Take steps to lose weight if you are overweight.  Avoid using depressants, including alcohol, sedatives, and narcotics.  Do not use any products that contain nicotine or tobacco, such as cigarettes, e-cigarettes, and chewing tobacco. If you need help quitting, ask your health care provider. General instructions  Take over-the-counter and prescription medicines only as told by your health care provider.  If you were given a device to open your airway while you sleep, use it only as told by your health care provider.  If you are having surgery, make sure to tell your health care provider you have sleep apnea. You may need to bring your device with you.  Keep all follow-up visits as told by your health care provider. This is important. Contact a health care provider if:  The device that you received to open your airway during sleep is uncomfortable or does not seem to be working.  Your symptoms do not improve.  Your symptoms get worse. Get help right away if:  You develop: ? Chest pain. ? Shortness of breath. ? Discomfort in your back, arms, or stomach.  You have: ? Trouble speaking. ? Weakness on one side of your body. ? Drooping in your face. These symptoms may represent a serious problem that is an emergency. Do not wait to see if the symptoms will go away. Get medical help right away. Call your local emergency services (911 in the [place] Do not drive yourself to the hospital. Summary  Sleep apnea is a condition in which breathing pauses or becomes shallow during sleep.  The most common cause is a collapsed or blocked airway.  The goal of treatment is to restore normal breathing and to ease symptoms during sleep. This information is not intended to replace advice given to you by your health care provider. Make sure you discuss any questions you  have with your health care provider. Document Revised: 04/07/2019 Document Reviewed: 06/16/2018 Elsevier Patient Education  [place].

## 2020-01-21 ENCOUNTER — Encounter: Payer: Self-pay | Admitting: Neurology

## 2020-01-21 ENCOUNTER — Ambulatory Visit: Payer: Medicare HMO | Admitting: Neurology

## 2020-01-21 ENCOUNTER — Other Ambulatory Visit: Payer: Self-pay

## 2020-01-21 VITALS — BP 119/80 | HR 52 | Temp 96.7°F | Ht 71.0 in | Wt 246.0 lb

## 2020-01-21 DIAGNOSIS — R251 Tremor, unspecified: Secondary | ICD-10-CM | POA: Diagnosis not present

## 2020-01-21 NOTE — Progress Notes (Signed)
PATIENT: Joshua [name] DOB: Apr 17, 1946  Chief Complaint  Patient presents with  . New Patient (Initial Visit)    pt alone, back hall. he has developed a little bit of shaking. his wife attributes to alcohol. he states that this is 2/3 rd day without ETOH and notices tremor in thumbs. started to notice this around 4-5 months ago     Joshua [name] is a 74 year old male, seen in request by his primary care physician Dr. [name] for evaluation of tremor, initial evaluation was on January 21, 2020.  I have reviewed and summarized the referring note from the referring physician and multiple hospital chart, emergency presentation on September 22, 2020, he presented for shortness of breath, CT chest without evidence of PE, was found to be atrial fibrillation, was started on anticoagulation,  He had DC cardioversion by Dr. Meda Coffee on November 29, 2019, that was successful.  He still anticoagulation Eliquis 5 mg twice a day, amiodarone, he also has history of hyperlipidemia, anger issues, is under the care of psychiatrist at the [facility], was started on Depakote 750 mg at bedtime in 2020 along with Prozac 20 mg, hydroxyzine 10 mg, few months later, around November 2020, he began to notice bilateral hands tremor, involving both hands, right hand seems to be more obvious than the left hand, he is right-handed, now he eats with his left hand, sometimes the shaking was so bad, half of his bones was spilled out, especially when holding on small granulated food, such as rice, peas.  He denies gait abnormality, denied loss sense of smell, no constipation, orthostatic dizziness, he does have vivid dreams, wife reported excessive movement during sleep, his wife also send a note concerning about his gradual increased alcohol intake, patient reported that he can take up to 6 to 8 ounce of hard liquor in certain days, but can go on days without alcohol consumption without craving, he also  drinks about 10 ounces of caffeine, he denies family history of tremor  He was started on primidone since January 03, 2019, titrating dose now to 50 mg 5 tablets every night, he noticed mild improvement, does complains of drowsiness.  Laboratory evaluations since 2020, normal TSH, CMP abnormal kidney function, creatinine of 1.39,, CBC showed decreased WBC, with elevated MCV, elevated free T4 1.24, lipid profile showed LDL 73,  REVIEW OF SYSTEMS: Full 14 system review of systems performed and notable only for as above All other review of systems were negative.  ALLERGIES: Allergies  Allergen Reactions  . Venlafaxine Palpitations  . Bupropion Other (See Comments)    unknown  . Zoloft [Sertraline Hcl] Other (See Comments)    Tick    HOME MEDICATIONS: Current Outpatient Medications  Medication Sig Dispense Refill  . acetaminophen (TYLENOL) 500 MG tablet Take 1,000 mg by mouth every 6 (six) hours as needed (for pain.).    Marland Kitchen amiodarone (PACERONE) 200 MG tablet Take 0.5 tablets (100 mg total) by mouth daily. 30 tablet 3  . apixaban (ELIQUIS) 5 MG TABS tablet Take 5 mg by mouth 2 (two) times daily.    Marland Kitchen atorvastatin (LIPITOR) 20 MG tablet TAKE ONE TABLET BY MOUTH DAILY 90 tablet 3  . divalproex (DEPAKOTE) 250 MG DR tablet Take 750 mg by mouth at bedtime.     Marland Kitchen FLUoxetine (PROZAC) 20 MG capsule Take 20 mg by mouth daily.     . hydrOXYzine (ATARAX/VISTARIL) 10 MG tablet Take 10 mg by mouth daily.     Marland Kitchen  ketoconazole (NIZORAL) 2 % shampoo Apply 1 application topically 2 (two) times a week. Mondays & Thursdays    . primidone (MYSOLINE) 50 MG tablet Take 1 tablet (50 mg total) by mouth at bedtime for 3 days, THEN 2 tablets (100 mg total) at bedtime for 3 days, THEN 3 tablets (150 mg total) at bedtime for 3 days, THEN 4 tablets (200 mg total) at bedtime for 3 days, THEN 5 tablets (250 mg total) at bedtime for 21 days. 135 tablet 1  . tamsulosin (FLOMAX) 0.4 MG CAPS capsule Take 0.4 mg by mouth daily as  needed (KIDNEY STONES).      No current facility-administered medications for this visit.    PAST MEDICAL HISTORY: Past Medical History:  Diagnosis Date  . Abnormal findings on diagnostic imaging of lung 09/23/2019   09/22/2019-lung cancer screening-centrilobular and paraseptal emphysema, calcified granulomata noted bilaterally, no suspicious nodules, lung RADS 1, repeat in 12 months   . Atrial fibrillation ([place]) 09/23/2019  . Centrilobular emphysema ([place]) 09/23/2019   09/22/2019-lung cancer screening-centrilobular and paraseptal emphysema, calcified granulomata noted bilaterally, no suspicious nodules, lung RADS 1, repeat in 12 months  09/23/2019-FVC 3.74 (81% predicted), postbronchodilator ratio 80, postbronchodilator FEV1 2.88 (86% predicted), no bronchodilator response, DLCO 19.44 (73% predicted)  . DDD (degenerative disc disease), cervical 05/13/2008   Cervical Disc Degeneration  . History of adenomatous polyp of colon 03/03/2018   05/14/17: Colonoscopy: nonadvanced adenoma, f/u 5 yrs, use SuPrep, [place]/GAP  Last Assessment & Plan:  Relevant Hx: Course: Daily Update: Today's Plan:  Marland Kitchen Hyperlipidemia   . Kidney stone   . Nephrolithiasis 05/22/2011  . Nontraumatic incomplete tear of right rotator cuff 10/14/2018  . Other and unspecified hyperlipidemia 11/20/2009   Hyperlipidemia  . RBBB 06/09/2012  . Secondary hypercoagulable state ([place]) 09/28/2019  . Shortness of breath 09/23/2019    PAST SURGICAL HISTORY: Past Surgical History:  Procedure Laterality Date  . CARDIOVERSION N/A 10/15/2019   Procedure: CARDIOVERSION;  Surgeon: Jerline Pain, MD;  Location: [facility] ENDOSCOPY;  Service: Cardiovascular;  Laterality: N/A;  . CARDIOVERSION N/A 11/29/2019   Procedure: CARDIOVERSION;  Surgeon: Dorothy Spark, MD;  Location: [facility] ENDOSCOPY;  Service: Cardiovascular;  Laterality: N/A;  . HEMORRHOID SURGERY      FAMILY HISTORY: Family History  Problem Relation Age of Onset  . Varicose Veins  Mother   . Stroke Father     SOCIAL HISTORY: Social History   Socioeconomic History  . Marital status: Married    Spouse name: Not on file  . Number of children: Not on file  . Years of education: Not on file  . Highest education level: Not on file  Occupational History  . Not on file  Tobacco Use  . Smoking status: Former Smoker    Packs/day: 2.00    Years: 46.00    Pack years: 92.00    Quit date: 2010    Years since quitting: 11.2  . Smokeless tobacco: Never Used  Substance and Sexual Activity  . Alcohol use: Not Currently    Comment: 2-6 drinks a week  . Drug use: No  . Sexual activity: Not on file  Other Topics Concern  . Not on file  Social History Narrative  . Not on file   Social Determinants of Health   Financial Resource Strain:   . Difficulty of Paying Living Expenses:   Food Insecurity:   . Worried About Charity fundraiser in the Last Year:   . Arboriculturist in  the Last Year:   Transportation Needs:   . Film/video editor (Medical):   Marland Kitchen Lack of Transportation (Non-Medical):   Physical Activity:   . Days of Exercise per Week:   . Minutes of Exercise per Session:   Stress:   . Feeling of Stress :   Social Connections:   . Frequency of Communication with Friends and Family:   . Frequency of Social Gatherings with Friends and Family:   . Attends Religious Services:   . Active Member of Clubs or Organizations:   . Attends Archivist Meetings:   Marland Kitchen Marital Status:   Intimate Partner Violence:   . Fear of Current or Ex-Partner:   . Emotionally Abused:   Marland Kitchen Physically Abused:   . Sexually Abused:      PHYSICAL EXAM   Vitals:   01/21/20 0855  BP: 119/80  Pulse: (!) 52  Temp: (!) 96.7 F (35.9 C)  Weight: 246 lb (111.6 kg)  Height: 5\' 11"  (1.803 m)    Not recorded      Body mass index is 34.31 kg/m.  PHYSICAL EXAMNIATION:  Gen: NAD, conversant, well nourised, well groomed                     Cardiovascular: Regular  rate rhythm, no peripheral edema, warm, nontender. Eyes: Conjunctivae clear without exudates or hemorrhage Neck: Supple, no carotid bruits. Pulmonary: Clear to auscultation bilaterally   NEUROLOGICAL EXAM:  MENTAL STATUS: Speech:    Speech is normal; fluent and spontaneous with normal comprehension.  Cognition:     Orientation to time, place and person     Normal recent and remote memory     Normal Attention span and concentration     Normal Language, naming, repeating,spontaneous speech     Fund of knowledge   CRANIAL NERVES: CN II: Visual fields are full to confrontation. Pupils are round equal and briskly reactive to light. CN [name], IV, VI: extraocular movement are normal. No ptosis. CN V: Facial sensation is intact to light touch CN VII: Face is symmetric with normal eye closure  CN VIII: Hearing is normal to causal conversation. CN IX, X: Phonation is normal. CN XI: Head turning and shoulder shrug are intact  MOTOR: He has mild bilateral hands posturing tremor, mild instability when drawing spiral circles, there is no weakness, no rigidity or bradykinesia  REFLEXES: Reflexes are 2+ and symmetric at the biceps, triceps, knees, and ankles. Plantar responses are flexor.  SENSORY: Intact to light touch, pinprick and vibratory sensation are intact in fingers and toes.  COORDINATION: There is no trunk or limb dysmetria noted.  GAIT/STANCE: Posture is normal. Gait is steady with normal steps, base, arm swing, and turning.  Romberg is absent.   DIAGNOSTIC DATA (LABS, IMAGING, TESTING) - I reviewed patient records, labs, notes, testing and imaging myself where available.   ASSESSMENT AND PLAN  Sharrieff Dachel Berberian [name] is a 74 y.o. male   Bilateral hands tremor  There is no parkinsonian features on today's examination,  Differentiation diagnosis include medicine side effect from Depakote, with underlying essential tremor, need to rule out thyroid malfunction, and metabolic  disorders  Laboratory evaluations, including Depakote level,  MRI of the brain without contrast  I have suggested him talking with his [place] psychiatrist, to see if there are any alternative for treating his mood disorder other than the Depakote.   Marcial Pacas, M.D. Ph.D.  [facility] [address] [place], [place]  [id number] Ph: [phone] Fax: [phone]  CC: Gregor Hams, MD

## 2020-01-22 LAB — FOLATE: Folate: 12.6 ng/mL (ref >3.0)

## 2020-01-22 LAB — THYROID PANEL WITH TSH
Free Thyroxine Index: 4.4 (ref 1.2–4.9)
T3 Uptake Ratio: 46 % — ABNORMAL HIGH (ref 24–39)
T4, Total: 9.5 ug/dL (ref 4.5–12.0)
TSH: 3.97 u[IU]/mL (ref 0.450–4.500)

## 2020-01-22 LAB — VITAMIN B12: Vitamin B-12: 595 pg/mL (ref 232–1245)

## 2020-01-22 LAB — VALPROIC ACID LEVEL: Valproic Acid Lvl: 76 ug/mL (ref 50–100)

## 2020-01-24 ENCOUNTER — Telehealth: Payer: Self-pay

## 2020-01-24 NOTE — Telephone Encounter (Signed)
I attempted to call the clinic below. They close at 5pm. I will return the call tomorrow.

## 2020-01-24 NOTE — Telephone Encounter (Signed)
Voicemail left at 2:47p:  Dr. Tonita Cong?) a Psychiatrist at the [facility] says that the patient asked her to contact Dr. Krista Blue in regards to his Depakote. She said to call [phone] and ask to speak to a nurse to give them your depakote recommendations. She said she would also try calling again to speak with you directly.

## 2020-01-25 NOTE — Telephone Encounter (Addendum)
I returned the call to the [place] center in [place]. Dr. Ivory Broad, psychiatrist at [facility], would like to speak to Dr. Krista Blue about this patient's Depakote.   Dr. Harvest Dark was not available at the time but will call Dr. Krista Blue back on mobile number.

## 2020-01-25 NOTE — Telephone Encounter (Signed)
I have talked with his psychiatrist Dr. Onalee Hua, Cecelia Byars  She has started to decrease depakote from 750mg  to 500mg  qhs, it was given for anger control, generalized anxiety, which worked for his anger, but has caused worsening hands tremor

## 2020-01-26 ENCOUNTER — Telehealth: Payer: Self-pay | Admitting: Pulmonary Disease

## 2020-01-26 ENCOUNTER — Telehealth: Payer: Self-pay | Admitting: Neurology

## 2020-01-26 NOTE — Telephone Encounter (Signed)
Mcarthur Rossetti Josem Kaufmann: [id number] (exp. 01/26/20 to 02/25/20) order sent to GI. They will reach out to the patient to schedule.

## 2020-01-26 NOTE — Telephone Encounter (Signed)
Patient states [facility] called and confirmed appt for tomorrow. Does not need a call back.

## 2020-01-27 DIAGNOSIS — G4733 Obstructive sleep apnea (adult) (pediatric): Secondary | ICD-10-CM | POA: Diagnosis not present

## 2020-02-03 ENCOUNTER — Other Ambulatory Visit: Payer: Self-pay

## 2020-02-03 ENCOUNTER — Ambulatory Visit
Admission: RE | Admit: 2020-02-03 | Discharge: 2020-02-03 | Disposition: A | Discharge status: Home / Self Care | Payer: Medicare HMO | Source: Ambulatory Visit | Attending: Neurology | Admitting: Neurology

## 2020-02-03 DIAGNOSIS — R251 Tremor, unspecified: Secondary | ICD-10-CM | POA: Diagnosis not present

## 2020-02-27 DIAGNOSIS — G4733 Obstructive sleep apnea (adult) (pediatric): Secondary | ICD-10-CM | POA: Diagnosis not present

## 2020-03-01 ENCOUNTER — Telehealth: Payer: Self-pay

## 2020-03-01 NOTE — Telephone Encounter (Signed)
Pt left a Voicemail and needs some advice on taking his Primidone. If a nurse could call him back please

## 2020-03-01 NOTE — Telephone Encounter (Signed)
Advised patient to reduce meds to 3 pills instead of 5 pills per day until his office visit on 5/4 with Dr. Zigmund Daniel. Patient was agreeable since he wants to stop the medications.

## 2020-03-01 NOTE — Telephone Encounter (Signed)
We can discuss at his upcoming appt.

## 2020-03-01 NOTE — Telephone Encounter (Signed)
Joshua [name] would like to know if he should refill the medication: primidone.  If no, how does he stop...tapering or cold [place].  He would like to stop taking the med.

## 2020-03-07 ENCOUNTER — Encounter: Payer: Self-pay | Admitting: Family Medicine

## 2020-03-07 ENCOUNTER — Ambulatory Visit (INDEPENDENT_AMBULATORY_CARE_PROVIDER_SITE_OTHER): Payer: Medicare HMO | Admitting: Family Medicine

## 2020-03-07 ENCOUNTER — Other Ambulatory Visit: Payer: Self-pay

## 2020-03-07 DIAGNOSIS — F324 Major depressive disorder, single episode, in partial remission: Secondary | ICD-10-CM | POA: Diagnosis not present

## 2020-03-07 DIAGNOSIS — G250 Essential tremor: Secondary | ICD-10-CM

## 2020-03-07 DIAGNOSIS — I4891 Unspecified atrial fibrillation: Secondary | ICD-10-CM | POA: Diagnosis not present

## 2020-03-07 DIAGNOSIS — E782 Mixed hyperlipidemia: Secondary | ICD-10-CM

## 2020-03-07 DIAGNOSIS — F329 Major depressive disorder, single episode, unspecified: Secondary | ICD-10-CM | POA: Insufficient documentation

## 2020-03-07 MED ORDER — PRIMIDONE 50 MG PO TABS: ORAL_TABLET | ORAL | 0 refills | Status: DC

## 2020-03-07 NOTE — Assessment & Plan Note (Signed)
Stable at this time however he wishes to try being off of primidone since depakote has been reduced.  Given instructions on weaning from this.  Discussed if tremor is worsening we can restart.

## 2020-03-07 NOTE — Assessment & Plan Note (Signed)
He is doing well with atorvastatin.  Continue at current dose.

## 2020-03-07 NOTE — Assessment & Plan Note (Signed)
Followed by [facility] for management of medications.   Has been weaned down on his depakote.  Still feels ok at current dose.

## 2020-03-07 NOTE — Assessment & Plan Note (Signed)
RRR on exam today. Stable and without symptoms.  He will continue to follow with cardiology for management of amiodarone and eliquis.

## 2020-03-07 NOTE — Patient Instructions (Addendum)
Great to meet you today.   Reduce the primidone by 50mg  every 3 days until off of medication.  Let me know if tremor worsens significantly off of this medication.   Please see me again in 6 months or sooner if needed.

## 2020-03-07 NOTE — Progress Notes (Signed)
Joshua [name] - 74 y.o. male MRN [id number]  Date of birth: Mar 20, 1946  Subjective Chief Complaint  Patient presents with  . Establish Care    HPI Joshua [name] is a 74 y.o. male with history of A. Fib, ephysema, depression, tremor and HLD here today for follow up visit.   -A. Fib:  History of A. Fib s/p cardioversion x2.  He denies any symptoms since 2nd cardioversion.  He continues on amiodarone and is anticoagulated with eliquis.  He has started exercising more and tolerating this well.  He denies chest pain, shortness of breath, palpitations or fatigue.   -Tremor:  Current treatment with primidone.  Currently taking 250mg  but has cut back as he would like to get off this medication.  It has helped some but feels like cutting back on depakote has helped more.  He would like to continue with weaning from this.   -HLD: Managed with atorvastatin.  Tolerating well. Denies myalgias.   Lab Results  Component Value Date   [place] 75 12/10/2019     -Depression:  History of depression.  Current medications include depakote, fluoxetine, and atarax.  These are managed by psychiatry at [facility] in [place].  Stable at this time.  Reports that he is doing much better with mood swings he was having previously.   ROS:  A comprehensive ROS was completed and negative except as noted per HPI  Allergies  Allergen Reactions  . Venlafaxine Palpitations  . Bupropion Other (See Comments)    unknown  . Zoloft [Sertraline Hcl] Other (See Comments)    Tick    Past Medical History:  Diagnosis Date  . Abnormal findings on diagnostic imaging of lung 09/23/2019   09/22/2019-lung cancer screening-centrilobular and paraseptal emphysema, calcified granulomata noted bilaterally, no suspicious nodules, lung RADS 1, repeat in 12 months   . Atrial fibrillation ([place]) 09/23/2019  . Centrilobular emphysema ([place]) 09/23/2019   09/22/2019-lung cancer screening-centrilobular and paraseptal emphysema,  calcified granulomata noted bilaterally, no suspicious nodules, lung RADS 1, repeat in 12 months  09/23/2019-FVC 3.74 (81% predicted), postbronchodilator ratio 80, postbronchodilator FEV1 2.88 (86% predicted), no bronchodilator response, DLCO 19.44 (73% predicted)  . DDD (degenerative disc disease), cervical 05/13/2008   Cervical Disc Degeneration  . History of adenomatous polyp of colon 03/03/2018   05/14/17: Colonoscopy: nonadvanced adenoma, f/u 5 yrs, use SuPrep, Murphy/GAP  Last Assessment & Plan:  Relevant Hx: Course: Daily Update: Today's Plan:  Marland Kitchen Hyperlipidemia   . Kidney stone   . Nephrolithiasis 05/22/2011  . Nontraumatic incomplete tear of right rotator cuff 10/14/2018  . Other and unspecified hyperlipidemia 11/20/2009   Hyperlipidemia  . RBBB 06/09/2012  . Secondary hypercoagulable state ([place]) 09/28/2019  . Shortness of breath 09/23/2019    Past Surgical History:  Procedure Laterality Date  . CARDIOVERSION N/A 10/15/2019   Procedure: CARDIOVERSION;  Surgeon: Jerline Pain, MD;  Location: [facility] ENDOSCOPY;  Service: Cardiovascular;  Laterality: N/A;  . CARDIOVERSION N/A 11/29/2019   Procedure: CARDIOVERSION;  Surgeon: Dorothy Spark, MD;  Location[facility] ENDOSCOPY;  Service: Cardiovascular;  Laterality: N/A;  . HEMORRHOID SURGERY      Social History   Socioeconomic History  . Marital status: Married    Spouse name: Not on file  . Number of children: Not on file  . Years of education: Not on file  . Highest education level: Not on file  Occupational History  . Not on file  Tobacco Use  . Smoking status: Former Smoker  Packs/day: 2.00    Years: 46.00    Pack years: 92.00    Quit date: 2010    Years since quitting: 11.3  . Smokeless tobacco: Never Used  Substance and Sexual Activity  . Alcohol use: Not Currently    Comment: 2-6 drinks a week  . Drug use: No  . Sexual activity: Not on file  Other Topics Concern  . Not on file  Social History Narrative  . Not on file    Social Determinants of Health   Financial Resource Strain:   . Difficulty of Paying Living Expenses:   Food Insecurity:   . Worried About Charity fundraiser in the Last Year:   . Arboriculturist in the Last Year:   Transportation Needs:   . Film/video editor (Medical):   Marland Kitchen Lack of Transportation (Non-Medical):   Physical Activity:   . Days of Exercise per Week:   . Minutes of Exercise per Session:   Stress:   . Feeling of Stress :   Social Connections:   . Frequency of Communication with Friends and Family:   . Frequency of Social Gatherings with Friends and Family:   . Attends Religious Services:   . Active Member of Clubs or Organizations:   . Attends Archivist Meetings:   Marland Kitchen Marital Status:     Family History  Problem Relation Age of Onset  . Varicose Veins Mother   . Stroke Father     Health Maintenance  Topic Date Due  . Hepatitis C Screening  Never done  . COLONOSCOPY  Never done  . INFLUENZA VACCINE  06/04/2020  . TETANUS/TDAP  01/31/2026  . COVID-19 Vaccine  Completed  . PNA vac Low Risk Adult  Completed     ----------------------------------------------------------------------------------------------------------------------------------------------------------------------------------------------------------------- Physical Exam BP 111/69 (BP Location: Left Arm, Patient Position: Sitting, Cuff Size: Normal)   Pulse (!) 56   Ht 5' 10.87" (1.8 m)   Wt 239 lb 11.2 oz (108.7 kg)   SpO2 94%   BMI 33.56 kg/m   Physical Exam Constitutional:      Appearance: Normal appearance.  HENT:     Mouth/Throat:     Mouth: Mucous membranes are moist.  Eyes:     General: No scleral icterus. Cardiovascular:     Rate and Rhythm: Normal rate and regular rhythm.  Pulmonary:     Effort: Pulmonary effort is normal.     Breath sounds: Normal breath sounds.  Musculoskeletal:     Cervical back: Neck supple.  Skin:    General: Skin is warm and dry.   Neurological:     General: No focal deficit present.     Mental Status: He is alert.  Psychiatric:        Mood and Affect: Mood normal.        Behavior: Behavior normal.     ------------------------------------------------------------------------------------------------------------------------------------------------------------------------------------------------------------------- Assessment and Plan  Atrial fibrillation ([place]) RRR on exam today. Stable and without symptoms.  He will continue to follow with cardiology for management of amiodarone and eliquis.    Mixed hyperlipidemia He is doing well with atorvastatin.  Continue at current dose.    MDD (major depressive disorder) Followed by [facility] for management of medications.   Has been weaned down on his depakote.  Still feels ok at current dose.    Essential tremor Stable at this time however he wishes to try being off of primidone since depakote has been reduced.  Given instructions on weaning from this.  Discussed if tremor is worsening we can restart.     Meds ordered this encounter  Medications  . DISCONTD: primidone (MYSOLINE) 50 MG tablet    Sig: Starting at 250mg , reduce by 50mg  every 3 days until off medication.    Dispense:  135 tablet    Refill:  0  . primidone (MYSOLINE) 50 MG tablet    Sig: Starting at 250mg , reduce by 50mg  every 3 days until off medication.    Dispense:  135 tablet    Refill:  0    Return in about 6 months (around 09/07/2020) for Tremor/A.fib.    This visit occurred during the SARS-CoV-2 public health emergency.  Safety protocols were in place, including screening questions prior to the visit, additional usage of staff PPE, and extensive cleaning of exam room while observing appropriate contact time as indicated for disinfecting solutions.

## 2020-03-09 DIAGNOSIS — C44619 Basal cell carcinoma of skin of left upper limb, including shoulder: Secondary | ICD-10-CM | POA: Diagnosis not present

## 2020-03-09 DIAGNOSIS — L218 Other seborrheic dermatitis: Secondary | ICD-10-CM | POA: Diagnosis not present

## 2020-03-09 DIAGNOSIS — D485 Neoplasm of uncertain behavior of skin: Secondary | ICD-10-CM | POA: Diagnosis not present

## 2020-03-19 NOTE — Progress Notes (Signed)
Primary Care Physician: [name], DO Primary Cardiologist: Dr Harriet Masson Primary Electrophysiologist: none Referring Physician: [facility]   Joshua [name] is a 74 y.o. male with a history of paroxysmal atrial fibrillation, emphysema, RBBB, aortic atherosclerosis who presents for follow up in the [facility]. The patient was initially diagnosed with atrial fibrillation in 2013 and saw cardiology at [facility]. More recently, he was seen by pulmonology for increased SOB and was found to be in rate controlled afib. His D-dimer was also noted to be elevated and so he was sent to the ER for evaluation. CTA and ultrasounds negative for PE or DVT. Patient was started on Eliquis for a CHADS2VASC score of 2. Patient reports that in hindsight, his SOB has been steadily getting worse for 2-3 months. He does note that his heart rate will jump up to 170s-180 when he walks on the treadmill. He does have occasional palpitations. He admits to snoring and daytime somnolence and has a sleep study pending. He denies significant alcohol use. Patient is s/p unsuccessful DCCV on 10/15/19. Echo showed preserved EF with severely dilated LA. Patient is s/p DCCV on 11/29/19.   On follow up today, patient reports that he has done well since his last visit. He denies any heart racing or palpitations. He was diagnosed with OSA but never started CPAP therapy. He did have tremors but this improved on the lower dose of depakote.   Today, he denies symptoms of palpitations, chest pain, orthopnea, PND, lower extremity edema, dizziness, presyncope, dizziness, syncope, snoring, daytime somnolence, bleeding, or neurologic sequela. The patient is tolerating medications without difficulties and is otherwise without complaint today.    Atrial Fibrillation Risk Factors:  he does have symptoms or diagnosis of sleep apnea. Sleep study is pending per pulmonology. he does not have a history of rheumatic  fever. he does not have a history of alcohol use. The patient does not have a history of early familial atrial fibrillation or other arrhythmias.  he has a BMI of Body mass index is 33.88 kg/m.Marland Kitchen Filed Weights   03/20/20 0909  Weight: 109.8 kg    Family History  Problem Relation Age of Onset  . Varicose Veins Mother   . Stroke Father      Atrial Fibrillation Management history:  Previous antiarrhythmic drugs: amiodarone Previous cardioversions: 10/15/19, 11/29/19 Previous ablations: none CHADS2VASC score: 2 Anticoagulation history: Eliquis   Past Medical History:  Diagnosis Date  . Abnormal findings on diagnostic imaging of lung 09/23/2019   09/22/2019-lung cancer screening-centrilobular and paraseptal emphysema, calcified granulomata noted bilaterally, no suspicious nodules, lung RADS 1, repeat in 12 months   . Atrial fibrillation ([place]) 09/23/2019  . Centrilobular emphysema ([place]) 09/23/2019   09/22/2019-lung cancer screening-centrilobular and paraseptal emphysema, calcified granulomata noted bilaterally, no suspicious nodules, lung RADS 1, repeat in 12 months  09/23/2019-FVC 3.74 (81% predicted), postbronchodilator ratio 80, postbronchodilator FEV1 2.88 (86% predicted), no bronchodilator response, DLCO 19.44 (73% predicted)  . DDD (degenerative disc disease), cervical 05/13/2008   Cervical Disc Degeneration  . History of adenomatous polyp of colon 03/03/2018   05/14/17: Colonoscopy: nonadvanced adenoma, f/u 5 yrs, use SuPrep, Murphy/GAP  Last Assessment & Plan:  Relevant Hx: Course: Daily Update: Today's Plan:  Marland Kitchen Hyperlipidemia   . Kidney stone   . Nephrolithiasis 05/22/2011  . Nontraumatic incomplete tear of right rotator cuff 10/14/2018  . Other and unspecified hyperlipidemia 11/20/2009   Hyperlipidemia  . RBBB 06/09/2012  . Secondary hypercoagulable state ([place])  09/28/2019  . Shortness of breath 09/23/2019   Past Surgical History:  Procedure Laterality Date  .  CARDIOVERSION N/A 10/15/2019   Procedure: CARDIOVERSION;  Surgeon: Jerline Pain, MD;  Location: [facility] ENDOSCOPY;  Service: Cardiovascular;  Laterality: N/A;  . CARDIOVERSION N/A 11/29/2019   Procedure: CARDIOVERSION;  Surgeon: Dorothy Spark, MD;  Location: [facility] ENDOSCOPY;  Service: Cardiovascular;  Laterality: N/A;  . HEMORRHOID SURGERY      Current Outpatient Medications  Medication Sig Dispense Refill  . acetaminophen (TYLENOL) 500 MG tablet Take 1,000 mg by mouth every 6 (six) hours as needed (for pain.).    Marland Kitchen amiodarone (PACERONE) 200 MG tablet Take 0.5 tablets (100 mg total) by mouth daily. 30 tablet 3  . apixaban (ELIQUIS) 5 MG TABS tablet Take 1 tablet (5 mg total) by mouth 2 (two) times daily. 180 tablet 2  . atorvastatin (LIPITOR) 20 MG tablet TAKE ONE TABLET BY MOUTH DAILY 90 tablet 3  . clobetasol (TEMOVATE) 0.05 % external solution     . divalproex (DEPAKOTE) 250 MG DR tablet Take 500 mg by mouth at bedtime.     Marland Kitchen FLUoxetine (PROZAC) 20 MG capsule Take 20 mg by mouth daily.     . hydrOXYzine (ATARAX/VISTARIL) 10 MG tablet Take 10 mg by mouth daily.     Marland Kitchen ketoconazole (NIZORAL) 2 % shampoo Apply 1 application topically 2 (two) times a week. Mondays & Thursdays    . tamsulosin (FLOMAX) 0.4 MG CAPS capsule Take 0.4 mg by mouth daily as needed (KIDNEY STONES).      No current facility-administered medications for this encounter.    Allergies  Allergen Reactions  . Venlafaxine Palpitations  . Bupropion Other (See Comments)    unknown  . Zoloft [Sertraline Hcl] Other (See Comments)    Tick    Social History   Socioeconomic History  . Marital status: Married    Spouse name: Not on file  . Number of children: Not on file  . Years of education: Not on file  . Highest education level: Not on file  Occupational History  . Not on file  Tobacco Use  . Smoking status: Former Smoker    Packs/day: 2.00    Years: 46.00    Pack years: 92.00    Quit date: 2010    Years since  quitting: 11.3  . Smokeless tobacco: Never Used  Substance and Sexual Activity  . Alcohol use: Not Currently    Comment: 2-6 drinks a week  . Drug use: No  . Sexual activity: Not on file  Other Topics Concern  . Not on file  Social History Narrative  . Not on file   Social Determinants of Health   Financial Resource Strain:   . Difficulty of Paying Living Expenses:   Food Insecurity:   . Worried About Charity fundraiser in the Last Year:   . Arboriculturist in the Last Year:   Transportation Needs:   . Film/video editor (Medical):   Marland Kitchen Lack of Transportation (Non-Medical):   Physical Activity:   . Days of Exercise per Week:   . Minutes of Exercise per Session:   Stress:   . Feeling of Stress :   Social Connections:   . Frequency of Communication with Friends and Family:   . Frequency of Social Gatherings with Friends and Family:   . Attends Religious Services:   . Active Member of Clubs or Organizations:   . Attends Archivist Meetings:   .  Marital Status:   Intimate Partner Violence:   . Fear of Current or Ex-Partner:   . Emotionally Abused:   Marland Kitchen Physically Abused:   . Sexually Abused:      ROS- All systems are reviewed and negative except as per the HPI above.  Physical Exam: Vitals:   03/20/20 0909  BP: 130/78  Pulse: (!) 45  Weight: 109.8 kg  Height: 5' 10.87" (1.8 m)    GEN- The patient is well appearing obese male, alert and oriented x 3 today.   HEENT-head normocephalic, atraumatic, sclera clear, conjunctiva pink, hearing intact, trachea midline. Lungs- Clear to ausculation bilaterally, normal work of breathing Heart- Regular rate and rhythm, bradycardia, no murmurs, rubs or gallops  GI- soft, NT, ND, + BS Extremities- no clubbing, cyanosis, or edema MS- no significant deformity or atrophy Skin- no rash or lesion Psych- euthymic mood, full affect Neuro- strength and sensation are intact   Wt Readings from Last 3 Encounters:    03/20/20 109.8 kg  03/07/20 108.7 kg  01/21/20 111.6 kg    EKG today demonstrates SB HR 45, RBBB, PR 160, QRS 158, QTc 448  Echo 10/05/19  1. Left ventricular ejection fraction, by visual estimation, is 65 to 70%. The left ventricle has hyperdynamic function. There is no left ventricular hypertrophy.  2. Left ventricular diastolic function could not be evaluated.  3. Global right ventricle has moderately reduced systolic function.The right ventricular size is normal. No increase in right ventricular wall thickness.  4. Left atrial size was severely dilated.  5. Right atrial size was moderately dilated.  6. The mitral valve is normal in structure. Mild mitral valve regurgitation.  7. The tricuspid valve is normal in structure. Tricuspid valve regurgitation is mild.  8. The aortic valve is normal in structure. Aortic valve regurgitation is not visualized. No evidence of aortic valve sclerosis or stenosis.  9. The pulmonic valve was normal in structure. Pulmonic valve regurgitation is not visualized. 10. Aortic dilatation noted. 11. 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 12. Normal pulmonary artery systolic pressure. 13. The atrial septum is grossly normal.  Epic records are reviewed at length today  Assessment and Plan:  1. Persistent atrial fibrillation S/p unsuccessful DCCV 10/15/19. S/p repeat DCCV on amiodarone 11/29/19.  Patient appears to be maintaining SR. Continue amiodarone to 200 mg daily. Continue Eliquis 5 mg BID Patient weaning off primidone this week. If patient needs this medication in the future, will need to change anticoagulation to Pradaxa or warfarin as it is contraindicated with Eliquis and Xarelto.   This patients CHA2DS2-VASc Score and unadjusted Ischemic Stroke Rate (% per year) is equal to 2.2 % stroke rate/year from a score of 2  Above score calculated as 1 point each if present [CHF, HTN, DM, Vascular=MI/PAD/Aortic Plaque, Age  if 65-74, or Male] Above score calculated as 2 points each if present [Age > 75, or Stroke/TIA/TE]  2. Obesity Body mass index is 33.88 kg/m. Lifestyle modification was discussed and encouraged including regular physical activity and weight reduction.  3. OSA The importance of adequate treatment of sleep apnea was discussed today in order to improve our ability to maintain sinus rhythm long term. Patient is not compliant with CPAP therapy.  4. CAD Coronary artery calcification and aortic atherosclerosis seen on chest CT. Myoview 12/20/19 normal. No anginal symptoms.   5. Bradycardia Asymptomatic, no change today.   Follow up with Dr Harriet Masson as scheduled. AF clinic in 4 months.  [facility] [address] [place], [place] [address] [phone] 03/20/2020 9:34 AM

## 2020-03-20 ENCOUNTER — Encounter (HOSPITAL_COMMUNITY): Payer: Self-pay | Admitting: Physician Assistant

## 2020-03-20 ENCOUNTER — Ambulatory Visit (HOSPITAL_COMMUNITY)
Admission: RE | Admit: 2020-03-20 | Discharge: 2020-03-20 | Disposition: A | Discharge status: Home / Self Care | Payer: Medicare HMO | Source: Ambulatory Visit | Attending: Physician Assistant | Admitting: Physician Assistant

## 2020-03-20 ENCOUNTER — Other Ambulatory Visit: Payer: Self-pay

## 2020-03-20 VITALS — BP 130/78 | HR 45 | Ht 70.87 in | Wt 242.0 lb

## 2020-03-20 DIAGNOSIS — J432 Centrilobular emphysema: Secondary | ICD-10-CM | POA: Diagnosis not present

## 2020-03-20 DIAGNOSIS — I700 Atherosclerosis of aorta: Secondary | ICD-10-CM | POA: Insufficient documentation

## 2020-03-20 DIAGNOSIS — Z87891 Personal history of nicotine dependence: Secondary | ICD-10-CM | POA: Insufficient documentation

## 2020-03-20 DIAGNOSIS — R001 Bradycardia, unspecified: Secondary | ICD-10-CM | POA: Insufficient documentation

## 2020-03-20 DIAGNOSIS — I480 Paroxysmal atrial fibrillation: Secondary | ICD-10-CM | POA: Diagnosis not present

## 2020-03-20 DIAGNOSIS — I2510 Atherosclerotic heart disease of native coronary artery without angina pectoris: Secondary | ICD-10-CM | POA: Diagnosis not present

## 2020-03-20 DIAGNOSIS — E669 Obesity, unspecified: Secondary | ICD-10-CM | POA: Diagnosis not present

## 2020-03-20 DIAGNOSIS — Z79899 Other long term (current) drug therapy: Secondary | ICD-10-CM | POA: Insufficient documentation

## 2020-03-20 DIAGNOSIS — Z7901 Long term (current) use of anticoagulants: Secondary | ICD-10-CM | POA: Diagnosis not present

## 2020-03-20 DIAGNOSIS — I4819 Other persistent atrial fibrillation: Secondary | ICD-10-CM | POA: Insufficient documentation

## 2020-03-20 DIAGNOSIS — E785 Hyperlipidemia, unspecified: Secondary | ICD-10-CM | POA: Insufficient documentation

## 2020-03-20 DIAGNOSIS — Z86010 Personal history of colonic polyps: Secondary | ICD-10-CM | POA: Diagnosis not present

## 2020-03-20 DIAGNOSIS — D6869 Other thrombophilia: Secondary | ICD-10-CM | POA: Diagnosis not present

## 2020-03-20 DIAGNOSIS — G4733 Obstructive sleep apnea (adult) (pediatric): Secondary | ICD-10-CM | POA: Diagnosis not present

## 2020-03-20 DIAGNOSIS — Z6833 Body mass index (BMI) 33.0-33.9, adult: Secondary | ICD-10-CM | POA: Diagnosis not present

## 2020-03-20 DIAGNOSIS — Z87442 Personal history of urinary calculi: Secondary | ICD-10-CM | POA: Insufficient documentation

## 2020-03-20 DIAGNOSIS — I4510 Unspecified right bundle-branch block: Secondary | ICD-10-CM | POA: Diagnosis not present

## 2020-03-20 DIAGNOSIS — M5030 Other cervical disc degeneration, unspecified cervical region: Secondary | ICD-10-CM | POA: Diagnosis not present

## 2020-03-20 MED ORDER — APIXABAN 5 MG PO TABS: 5.0000 mg | ORAL_TABLET | Freq: Two times a day (BID) | ORAL | 2 refills | Status: DC

## 2020-03-28 DIAGNOSIS — G4733 Obstructive sleep apnea (adult) (pediatric): Secondary | ICD-10-CM | POA: Diagnosis not present

## 2020-03-31 ENCOUNTER — Ambulatory Visit (INDEPENDENT_AMBULATORY_CARE_PROVIDER_SITE_OTHER): Payer: Medicare HMO | Admitting: Cardiology

## 2020-03-31 ENCOUNTER — Other Ambulatory Visit: Payer: Self-pay

## 2020-03-31 ENCOUNTER — Encounter: Payer: Self-pay | Admitting: Cardiology

## 2020-03-31 VITALS — BP 118/80 | HR 44 | Ht 70.87 in | Wt 240.0 lb

## 2020-03-31 DIAGNOSIS — R001 Bradycardia, unspecified: Secondary | ICD-10-CM | POA: Diagnosis not present

## 2020-03-31 DIAGNOSIS — I10 Essential (primary) hypertension: Secondary | ICD-10-CM | POA: Diagnosis not present

## 2020-03-31 DIAGNOSIS — E782 Mixed hyperlipidemia: Secondary | ICD-10-CM

## 2020-03-31 DIAGNOSIS — I480 Paroxysmal atrial fibrillation: Secondary | ICD-10-CM

## 2020-03-31 NOTE — Progress Notes (Signed)
Cardiology Office Note:    Date:  03/31/2020   ID:  Joshua [name], DOB 1946-01-13, MRN [id number]  PCP:  [name], DO  Cardiologist:  Berniece Salines, DO  Electrophysiologist:  None   Referring MD: Gregor Hams, MD   Chief Complaint  Patient presents with  . Follow-up    History of Present Illness:    Joshua [name] is a 74 y.o. male with a hx of atrial fibrillation on Eliquis, Mixed hyperlipidemia presents for follow-up visit.  I last saw the patient in February 2021 at that time he was experiencing shortness of breath and chest pain. At that time I recommended he undergo a nuclear stress test.  The patient was able to do his stress test was reported to be normal.  In the interim he had been able to follow with his PCP as well as with our A. fib clinic.  He tells me that he feels as his best he offers no complaints at this time.  Past Medical History:  Diagnosis Date  . Abnormal findings on diagnostic imaging of lung 09/23/2019   09/22/2019-lung cancer screening-centrilobular and paraseptal emphysema, calcified granulomata noted bilaterally, no suspicious nodules, lung RADS 1, repeat in 12 months   . Atrial fibrillation ([place]) 09/23/2019  . Centrilobular emphysema ([place]) 09/23/2019   09/22/2019-lung cancer screening-centrilobular and paraseptal emphysema, calcified granulomata noted bilaterally, no suspicious nodules, lung RADS 1, repeat in 12 months  09/23/2019-FVC 3.74 (81% predicted), postbronchodilator ratio 80, postbronchodilator FEV1 2.88 (86% predicted), no bronchodilator response, DLCO 19.44 (73% predicted)  . DDD (degenerative disc disease), cervical 05/13/2008   Cervical Disc Degeneration  . History of adenomatous polyp of colon 03/03/2018   05/14/17: Colonoscopy: nonadvanced adenoma, f/u 5 yrs, use SuPrep, [place]/GAP  Last Assessment & Plan:  Relevant Hx: Course: Daily Update: Today's Plan:  Marland Kitchen Hyperlipidemia   . Kidney stone   . Nephrolithiasis 05/22/2011   . Nontraumatic incomplete tear of right rotator cuff 10/14/2018  . Other and unspecified hyperlipidemia 11/20/2009   Hyperlipidemia  . RBBB 06/09/2012  . Secondary hypercoagulable state ([place]) 09/28/2019  . Shortness of breath 09/23/2019    Past Surgical History:  Procedure Laterality Date  . CARDIOVERSION N/A 10/15/2019   Procedure: CARDIOVERSION;  Surgeon: Jerline Pain, MD;  Location: [facility] ENDOSCOPY;  Service: Cardiovascular;  Laterality: N/A;  . CARDIOVERSION N/A 11/29/2019   Procedure: CARDIOVERSION;  Surgeon: Dorothy Spark, MD;  Location: [facility] ENDOSCOPY;  Service: Cardiovascular;  Laterality: N/A;  . HEMORRHOID SURGERY      Current Medications: Current Meds  Medication Sig  . acetaminophen (TYLENOL) 500 MG tablet Take 1,000 mg by mouth every 6 (six) hours as needed (for pain.).  Marland Kitchen amiodarone (PACERONE) 200 MG tablet Take 0.5 tablets (100 mg total) by mouth daily.  Marland Kitchen apixaban (ELIQUIS) 5 MG TABS tablet Take 1 tablet (5 mg total) by mouth 2 (two) times daily.  Marland Kitchen atorvastatin (LIPITOR) 20 MG tablet TAKE ONE TABLET BY MOUTH DAILY  . clobetasol (TEMOVATE) 0.05 % external solution   . divalproex (DEPAKOTE) 250 MG DR tablet Take 500 mg by mouth at bedtime.   Marland Kitchen FLUoxetine (PROZAC) 20 MG capsule Take 20 mg by mouth daily.   . hydrOXYzine (ATARAX/VISTARIL) 10 MG tablet Take 10 mg by mouth daily.   Marland Kitchen ketoconazole (NIZORAL) 2 % shampoo Apply 1 application topically 2 (two) times a week. Mondays & Thursdays  . tamsulosin (FLOMAX) 0.4 MG CAPS capsule Take 0.4 mg by mouth daily as needed (KIDNEY  STONES).      Allergies:   Venlafaxine, Bupropion, and Zoloft [sertraline hcl]   Social History   Socioeconomic History  . Marital status: Married    Spouse name: Not on file  . Number of children: Not on file  . Years of education: Not on file  . Highest education level: Not on file  Occupational History  . Not on file  Tobacco Use  . Smoking status: Former Smoker    Packs/day: 2.00     Years: 46.00    Pack years: 92.00    Quit date: 2010    Years since quitting: 11.4  . Smokeless tobacco: Never Used  Substance and Sexual Activity  . Alcohol use: Not Currently    Comment: 2-6 drinks a week  . Drug use: No  . Sexual activity: Not on file  Other Topics Concern  . Not on file  Social History Narrative  . Not on file   Social Determinants of Health   Financial Resource Strain:   . Difficulty of Paying Living Expenses:   Food Insecurity:   . Worried About Charity fundraiser in the Last Year:   . Arboriculturist in the Last Year:   Transportation Needs:   . Film/video editor (Medical):   Marland Kitchen Lack of Transportation (Non-Medical):   Physical Activity:   . Days of Exercise per Week:   . Minutes of Exercise per Session:   Stress:   . Feeling of Stress :   Social Connections:   . Frequency of Communication with Friends and Family:   . Frequency of Social Gatherings with Friends and Family:   . Attends Religious Services:   . Active Member of Clubs or Organizations:   . Attends Archivist Meetings:   Marland Kitchen Marital Status:      Family History: The patient's family history includes Stroke in his father; Varicose Veins in his mother.  ROS:   Review of Systems  Constitution: Negative for decreased appetite, fever and weight gain.  HENT: Negative for congestion, ear discharge, hoarse voice and sore throat.   Eyes: Negative for discharge, redness, vision loss in right eye and visual halos.  Cardiovascular: Negative for chest pain, dyspnea on exertion, leg swelling, orthopnea and palpitations.  Respiratory: Negative for cough, hemoptysis, shortness of breath and snoring.   Endocrine: Negative for heat intolerance and polyphagia.  Hematologic/Lymphatic: Negative for bleeding problem. Does not bruise/bleed easily.  Skin: Negative for flushing, nail changes, rash and suspicious lesions.  Musculoskeletal: Negative for arthritis, joint pain, muscle cramps,  myalgias, neck pain and stiffness.  Gastrointestinal: Negative for abdominal pain, bowel incontinence, diarrhea and excessive appetite.  Genitourinary: Negative for decreased libido, genital sores and incomplete emptying.  Neurological: Negative for brief paralysis, focal weakness, headaches and loss of balance.  Psychiatric/Behavioral: Negative for altered mental status, depression and suicidal ideas.  Allergic/Immunologic: Negative for HIV exposure and persistent infections.    EKGs/Labs/Other Studies Reviewed:    The following studies were reviewed today:   EKG:  The ekg ordered today demonstrates sinus bradycardia, heart rate 44 bpm, compared to prior EKG Mar 20, 2020 no significant change.  Pharmacologic stress test  The left ventricular ejection fraction is mildly decreased (45-54%).  Nuclear stress EF: 53%.  There was no ST segment deviation noted during stress.  The study is normal.  This is a low risk study.     Transthoracic echocardiogram October 05, 2019 1. Left ventricular ejection fraction, by visual estimation, is  65 to  70%. The left ventricle has hyperdynamic function. There is no left  ventricular hypertrophy.  2. Left ventricular diastolic function could not be evaluated.  3. Global right ventricle has moderately reduced systolic function.The  right ventricular size is normal. No increase in right ventricular wall  thickness.  4. Left atrial size was severely dilated.  5. Right atrial size was moderately dilated.  6. The mitral valve is normal in structure. Mild mitral valve  regurgitation.  7. The tricuspid valve is normal in structure. Tricuspid valve  regurgitation is mild.  8. The aortic valve is normal in structure. Aortic valve regurgitation is  not visualized. No evidence of aortic valve sclerosis or stenosis.  9. The pulmonic valve was normal in structure. Pulmonic valve  regurgitation is not visualized.  10. Aortic dilatation noted.   11. 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  12. Normal pulmonary artery systolic pressure.  13. The atrial septum is grossly normal.    Recent Labs: 11/22/2019: ALT 29; BUN 21; Creatinine, Ser 1.39; Hemoglobin 14.4; Platelets 175; Potassium 4.1; Sodium 141 01/21/2020: TSH 3.970  Recent Lipid Panel    Component Value Date/Time   CHOL 139 12/10/2019 0948   TRIG 100 12/10/2019 0948   HDL 47 12/10/2019 0948   CHOLHDL 3.0 12/10/2019 0948   LDLCALC 73 12/10/2019 0948    Physical Exam:    VS:  BP 118/80   Pulse (!) 44   Ht 5' 10.87" (1.8 m)   Wt 240 lb (108.9 kg)   SpO2 92%   BMI 33.60 kg/m     Wt Readings from Last 3 Encounters:  03/31/20 240 lb (108.9 kg)  03/20/20 242 lb (109.8 kg)  03/07/20 239 lb 11.2 oz (108.7 kg)     GEN: Well nourished, well developed in no acute distress HEENT: Normal NECK: No JVD; No carotid bruits LYMPHATICS: No lymphadenopathy CARDIAC: S1S2 noted,RRR, no murmurs, rubs, gallops RESPIRATORY:  Clear to auscultation without rales, wheezing or rhonchi  ABDOMEN: Soft, non-tender, non-distended, +bowel sounds, no guarding. EXTREMITIES: No edema, No cyanosis, no clubbing MUSCULOSKELETAL:  No deformity  SKIN: Warm and dry NEUROLOGIC:  Alert and oriented x 3, non-focal PSYCHIATRIC:  Normal affect, good insight  ASSESSMENT:    1. Paroxysmal atrial fibrillation (HCC)   2. Mixed hyperlipidemia   3. Sinus bradycardia   4. Essential hypertension    PLAN:     1.  He denies any recurrent chest pain or shortness of breath.  His recent stress test was reported to be normal.  2.  Paroxysmal atrial fibrillation-the patient is in sinus bradycardia today.  He denies any dizziness.  Continue him on his Eliquis 5 mg twice daily, amiodarone 100 mg daily.  3.  Hyperlipidemia-continue patient on atorvastatin 20 mg daily.  4.  Hypertension-blood pressure is acceptable no changes to be made today.  The patient is in agreement  with the above plan. The patient left the office in stable condition.  The patient will follow up in 1 year or sooner if needed.   Medication Adjustments/Labs and Tests Ordered: Current medicines are reviewed at length with the patient today.  Concerns regarding medicines are outlined above.  Orders Placed This Encounter  Procedures  . EKG 12-Lead   No orders of the defined types were placed in this encounter.   Patient Instructions  Medication Instructions:  Your physician recommends that you continue on your current medications as directed. Please refer to the Current Medication list  given to you today.  *If you need a refill on your cardiac medications before your next appointment, please call your pharmacy*   Lab Work: No labs done today  If you have labs (blood work) drawn today and your tests are completely normal, you will receive your results only by: Marland Kitchen MyChart Message (if you have MyChart) OR . A paper copy in the mail If you have any lab test that is abnormal or we need to change your treatment, we will call you to review the results.   Testing/Procedures: EKG was done today   Follow-Up: At [facility], you and your health needs are our priority.  As part of our continuing mission to provide you with exceptional heart care, we have created designated Provider Care Teams.  These Care Teams include your primary Cardiologist (physician) and Advanced Practice Providers (APPs -  Physician Assistants and Nurse Practitioners) who all work together to provide you with the care you need, when you need it.  We recommend signing up for the patient portal called "MyChart".  Sign up information is provided on this After Visit Summary.  MyChart is used to connect with patients for Virtual Visits (Telemedicine).  Patients are able to view lab/test results, encounter notes, upcoming appointments, etc.  Non-urgent messages can be sent to your provider as well.   To learn more about  what you can do with MyChart, go to [email or web address].    Your next appointment:   1 Year   The format for your next appointment:   Follow up   Provider:   Dr. Berniece Salines   Other Instructions     Adopting a Healthy Lifestyle.  Know what a healthy weight is for you (roughly BMI <25) and aim to maintain this   Aim for 7+ servings of fruits and vegetables daily   65-80+ fluid ounces of water or unsweet tea for healthy kidneys   Limit to max 1 drink of alcohol per day; avoid smoking/tobacco   Limit animal fats in diet for cholesterol and heart health - choose grass fed whenever available   Avoid highly processed foods, and foods high in saturated/trans fats   Aim for low stress - take time to unwind and care for your mental health   Aim for 150 min of moderate intensity exercise weekly for heart health, and weights twice weekly for bone health   Aim for 7-9 hours of sleep daily   When it comes to diets, agreement about the perfect plan isnt easy to find, even among the experts. Experts at the [employer] developed an idea known as the Healthy Eating Plate. Just imagine a plate divided into logical, healthy portions.   The emphasis is on diet quality:   Load up on vegetables and fruits - one-half of your plate: Aim for color and variety, and remember that potatoes dont count.   Go for whole grains - one-quarter of your plate: Whole wheat, barley, wheat berries, quinoa, oats, brown rice, and foods made with them. If you want pasta, go with whole wheat pasta.   Protein power - one-quarter of your plate: Fish, chicken, beans, and nuts are all healthy, versatile protein sources. Limit red meat.   The diet, however, does go beyond the plate, offering a few other suggestions.   Use healthy plant oils, such as olive, canola, soy, corn, sunflower and peanut. Check the labels, and avoid partially hydrogenated oil, which have unhealthy trans fats.   If  youre  thirsty, drink water. Coffee and tea are good in moderation, but skip sugary drinks and limit milk and dairy products to one or two daily servings.   The type of carbohydrate in the diet is more important than the amount. Some sources of carbohydrates, such as vegetables, fruits, whole grains, and beans-are healthier than others.   Finally, stay active  Signed, Berniece Salines, DO  03/31/2020 10:15 AM    [place]

## 2020-03-31 NOTE — Patient Instructions (Signed)
Medication Instructions:  Your physician recommends that you continue on your current medications as directed. Please refer to the Current Medication list given to you today.  *If you need a refill on your cardiac medications before your next appointment, please call your pharmacy*   Lab Work: No labs done today  If you have labs (blood work) drawn today and your tests are completely normal, you will receive your results only by: Marland Kitchen MyChart Message (if you have MyChart) OR . A paper copy in the mail If you have any lab test that is abnormal or we need to change your treatment, we will call you to review the results.   Testing/Procedures: EKG was done today   Follow-Up: At [facility], you and your health needs are our priority.  As part of our continuing mission to provide you with exceptional heart care, we have created designated Provider Care Teams.  These Care Teams include your primary Cardiologist (physician) and Advanced Practice Providers (APPs -  Physician Assistants and Nurse Practitioners) who all work together to provide you with the care you need, when you need it.  We recommend signing up for the patient portal called "MyChart".  Sign up information is provided on this After Visit Summary.  MyChart is used to connect with patients for Virtual Visits (Telemedicine).  Patients are able to view lab/test results, encounter notes, upcoming appointments, etc.  Non-urgent messages can be sent to your provider as well.   To learn more about what you can do with MyChart, go to [email or web address].    Your next appointment:   1 Year   The format for your next appointment:   Follow up   Provider:   Dr. Berniece Salines   Other Instructions

## 2020-04-04 DIAGNOSIS — C44619 Basal cell carcinoma of skin of left upper limb, including shoulder: Secondary | ICD-10-CM | POA: Diagnosis not present

## 2020-05-26 ENCOUNTER — Telehealth: Payer: Self-pay | Admitting: Cardiology

## 2020-05-26 ENCOUNTER — Telehealth: Payer: Self-pay

## 2020-05-26 NOTE — Telephone Encounter (Signed)
Pt c/o medication issue:  1. Name of Medication: atorvastatin (LIPITOR) 20 MG tablet  2. How are you currently taking this medication (dosage and times per day)? As directed  3. Are you having a reaction (difficulty breathing--STAT)? No  4. What is your medication issue? Patient states his doctor with the [facility] recommended that he contact Dr. Harriet Masson to have atorvastatin (LIPITOR) 20 MG tablet dosage increased due to lipid results. Please call to discuss.

## 2020-05-26 NOTE — Telephone Encounter (Signed)
Left message on patients voicemail to please return our call.   

## 2020-05-26 NOTE — Telephone Encounter (Signed)
Patient called back returning a call from our office °

## 2020-05-26 NOTE — Telephone Encounter (Signed)
Spoke with the patient just now and let him know that Dr. Harriet Masson did review his lab work at this time and stated that she would not make any changes at this time. He verbalizes understanding and does not have any other issues or concerns at this time.

## 2020-05-26 NOTE — Telephone Encounter (Signed)
Spoke with the patient just now and he was very frustrated at this time. He states that when I have been calling I have not been leaving a voicemail and it is only ringing twice. I assured him that I have left two voicemail's but he denies this. He is very aggravated at this. I told him that I was just calling back to request that he have the [facility] send [place] the results. He states that I need to call the [facility] and request these labs and that he will not do it. I let him know that I would call them and request the labs at this time.   I called the [facility] in [place] at [phone]. I spoke with Dr. Blima Rich nurse and she said that she was going to fax these labs to both our [place] office and our [place] office at this time.   I called Mr. Moncus back at this time and let him know that as soon as we received these labs and they were reviewed by Dr. Harriet Masson that we would give him a call back with any other recommendations. He verbalizes understanding.

## 2020-07-01 ENCOUNTER — Other Ambulatory Visit (HOSPITAL_COMMUNITY): Payer: Self-pay | Admitting: Physician Assistant

## 2020-07-17 ENCOUNTER — Ambulatory Visit (HOSPITAL_COMMUNITY)
Admission: RE | Admit: 2020-07-17 | Discharge: 2020-07-17 | Disposition: A | Discharge status: Home / Self Care | Payer: Medicare HMO | Source: Ambulatory Visit | Attending: Physician Assistant | Admitting: Physician Assistant

## 2020-07-17 ENCOUNTER — Other Ambulatory Visit: Payer: Self-pay

## 2020-07-17 VITALS — BP 118/76 | HR 52 | Ht 70.0 in | Wt 238.2 lb

## 2020-07-17 DIAGNOSIS — I081 Rheumatic disorders of both mitral and tricuspid valves: Secondary | ICD-10-CM | POA: Insufficient documentation

## 2020-07-17 DIAGNOSIS — I4510 Unspecified right bundle-branch block: Secondary | ICD-10-CM | POA: Diagnosis not present

## 2020-07-17 DIAGNOSIS — D6869 Other thrombophilia: Secondary | ICD-10-CM | POA: Diagnosis not present

## 2020-07-17 DIAGNOSIS — I2510 Atherosclerotic heart disease of native coronary artery without angina pectoris: Secondary | ICD-10-CM | POA: Insufficient documentation

## 2020-07-17 DIAGNOSIS — Z79899 Other long term (current) drug therapy: Secondary | ICD-10-CM | POA: Insufficient documentation

## 2020-07-17 DIAGNOSIS — R001 Bradycardia, unspecified: Secondary | ICD-10-CM | POA: Insufficient documentation

## 2020-07-17 DIAGNOSIS — Z6834 Body mass index (BMI) 34.0-34.9, adult: Secondary | ICD-10-CM | POA: Diagnosis not present

## 2020-07-17 DIAGNOSIS — I4819 Other persistent atrial fibrillation: Secondary | ICD-10-CM

## 2020-07-17 DIAGNOSIS — E669 Obesity, unspecified: Secondary | ICD-10-CM | POA: Insufficient documentation

## 2020-07-17 DIAGNOSIS — Z87891 Personal history of nicotine dependence: Secondary | ICD-10-CM | POA: Diagnosis not present

## 2020-07-17 DIAGNOSIS — J439 Emphysema, unspecified: Secondary | ICD-10-CM | POA: Diagnosis not present

## 2020-07-17 DIAGNOSIS — I700 Atherosclerosis of aorta: Secondary | ICD-10-CM | POA: Diagnosis not present

## 2020-07-17 DIAGNOSIS — Z7901 Long term (current) use of anticoagulants: Secondary | ICD-10-CM | POA: Insufficient documentation

## 2020-07-17 DIAGNOSIS — E785 Hyperlipidemia, unspecified: Secondary | ICD-10-CM | POA: Insufficient documentation

## 2020-07-17 DIAGNOSIS — G4733 Obstructive sleep apnea (adult) (pediatric): Secondary | ICD-10-CM | POA: Insufficient documentation

## 2020-07-17 LAB — COMPREHENSIVE METABOLIC PANEL
ALT: 26 U/L (ref 0–44)
AST: 35 U/L (ref 15–41)
Albumin: 3.5 g/dL (ref 3.5–5.0)
Alkaline Phosphatase: 81 U/L (ref 38–126)
Anion gap: 13 (ref 5–15)
BUN: 19 mg/dL (ref 8–23)
CO2: 21 mmol/L — ABNORMAL LOW (ref 22–32)
Calcium: 9 mg/dL (ref 8.9–10.3)
Chloride: 106 mmol/L (ref 98–111)
Creatinine, Ser: 1.26 mg/dL — ABNORMAL HIGH (ref 0.61–1.24)
GFR calc Af Amer: >60 mL/min (ref >60)
GFR calc non Af Amer: 57 mL/min — ABNORMAL LOW (ref >60)
Glucose, Bld: 102 mg/dL — ABNORMAL HIGH (ref 70–99)
Potassium: 3.9 mmol/L (ref 3.5–5.1)
Sodium: 140 mmol/L (ref 135–145)
Total Bilirubin: 0.7 mg/dL (ref 0.3–1.2)
Total Protein: 6.8 g/dL (ref 6.5–8.1)

## 2020-07-17 LAB — TSH: TSH: 2.73 u[IU]/mL (ref 0.350–4.500)

## 2020-07-17 MED ORDER — AMIODARONE HCL 200 MG PO TABS: 100.0000 mg | ORAL_TABLET | Freq: Every day | ORAL | Status: DC

## 2020-07-17 NOTE — Progress Notes (Signed)
Primary Care Physician: [name], DO Primary Cardiologist: Dr Harriet Masson Primary Electrophysiologist: none Referring Physician: [facility]   Joshua [name] is a 74 y.o. male with a history of paroxysmal atrial fibrillation, emphysema, RBBB, aortic atherosclerosis who presents for follow up in the [facility]. The patient was initially diagnosed with atrial fibrillation in 2013 and saw cardiology at [facility]. More recently, he was seen by pulmonology for increased SOB and was found to be in rate controlled afib. His D-dimer was also noted to be elevated and so he was sent to the ER for evaluation. CTA and ultrasounds negative for PE or DVT. Patient was started on Eliquis for a CHADS2VASC score of 2. Patient reports that in hindsight, his SOB has been steadily getting worse for 2-3 months. He does note that his heart rate will jump up to 170s-180 when he walks on the treadmill. He does have occasional palpitations. He admits to snoring and daytime somnolence and has a sleep study pending. He denies significant alcohol use. Patient is s/p unsuccessful DCCV on 10/15/19. Echo showed preserved EF with severely dilated LA. Patient is s/p DCCV on 11/29/19 on amiodarone.    On follow up today, patient reports that he has done well since his last visit. He denies any heart racing or palpitations. He denies any bleeding issues on anticoagulation.   Today, he denies symptoms of palpitations, chest pain, orthopnea, PND, lower extremity edema, dizziness, presyncope, dizziness, syncope, bleeding, or neurologic sequela. The patient is tolerating medications without difficulties and is otherwise without complaint today.    Atrial Fibrillation Risk Factors:  he does have symptoms or diagnosis of sleep apnea. He is not on CPAP therapy. he does not have a history of rheumatic fever. he does not have a history of alcohol use. The patient does not have a history of early familial  atrial fibrillation or other arrhythmias.  he has a BMI of Body mass index is 34.18 kg/m.Marland Kitchen Filed Weights   07/17/20 0859  Weight: 108 kg    Family History  Problem Relation Age of Onset  . Varicose Veins Mother   . Stroke Father      Atrial Fibrillation Management history:  Previous antiarrhythmic drugs: amiodarone Previous cardioversions: 10/15/19, 11/29/19 Previous ablations: none CHADS2VASC score: 2 Anticoagulation history: Eliquis   Past Medical History:  Diagnosis Date  . Abnormal findings on diagnostic imaging of lung 09/23/2019   09/22/2019-lung cancer screening-centrilobular and paraseptal emphysema, calcified granulomata noted bilaterally, no suspicious nodules, lung RADS 1, repeat in 12 months   . Atrial fibrillation ([place]) 09/23/2019  . Centrilobular emphysema ([place]) 09/23/2019   09/22/2019-lung cancer screening-centrilobular and paraseptal emphysema, calcified granulomata noted bilaterally, no suspicious nodules, lung RADS 1, repeat in 12 months  09/23/2019-FVC 3.74 (81% predicted), postbronchodilator ratio 80, postbronchodilator FEV1 2.88 (86% predicted), no bronchodilator response, DLCO 19.44 (73% predicted)  . DDD (degenerative disc disease), cervical 05/13/2008   Cervical Disc Degeneration  . History of adenomatous polyp of colon 03/03/2018   05/14/17: Colonoscopy: nonadvanced adenoma, f/u 5 yrs, use SuPrep, Murphy/GAP  Last Assessment & Plan:  Relevant Hx: Course: Daily Update: Today's Plan:  Marland Kitchen Hyperlipidemia   . Kidney stone   . Nephrolithiasis 05/22/2011  . Nontraumatic incomplete tear of right rotator cuff 10/14/2018  . Other and unspecified hyperlipidemia 11/20/2009   Hyperlipidemia  . RBBB 06/09/2012  . Secondary hypercoagulable state ([place]) 09/28/2019  . Shortness of breath 09/23/2019   Past Surgical History:  Procedure Laterality Date  .  CARDIOVERSION N/A 10/15/2019   Procedure: CARDIOVERSION;  Surgeon: Jerline Pain, MD;  Location: [facility] ENDOSCOPY;   Service: Cardiovascular;  Laterality: N/A;  . CARDIOVERSION N/A 11/29/2019   Procedure: CARDIOVERSION;  Surgeon: Dorothy Spark, MD;  Location: [facility] ENDOSCOPY;  Service: Cardiovascular;  Laterality: N/A;  . HEMORRHOID SURGERY      Current Outpatient Medications  Medication Sig Dispense Refill  . acetaminophen (TYLENOL) 500 MG tablet Take 1,000 mg by mouth every 6 (six) hours as needed (for pain.).    Marland Kitchen amiodarone (PACERONE) 200 MG tablet Take 0.5 tablets (100 mg total) by mouth daily.    Marland Kitchen apixaban (ELIQUIS) 5 MG TABS tablet Take 1 tablet (5 mg total) by mouth 2 (two) times daily. 180 tablet 2  . atorvastatin (LIPITOR) 20 MG tablet TAKE ONE TABLET BY MOUTH DAILY 90 tablet 3  . clobetasol (TEMOVATE) 0.05 % external solution Apply 1 application topically. Uses Monday and Thursday    . divalproex (DEPAKOTE) 250 MG DR tablet Take 250 mg by mouth at bedtime.     Marland Kitchen FLUoxetine (PROZAC) 20 MG capsule Take 20 mg by mouth daily.     . hydrOXYzine (ATARAX/VISTARIL) 10 MG tablet Take 10 mg by mouth as needed.     Marland Kitchen ketoconazole (NIZORAL) 2 % shampoo Apply 1 application topically 2 (two) times a week.     . tamsulosin (FLOMAX) 0.4 MG CAPS capsule Take 0.4 mg by mouth daily.      No current facility-administered medications for this encounter.    Allergies  Allergen Reactions  . Venlafaxine Palpitations  . Bupropion Other (See Comments)    unknown  . Zoloft [Sertraline Hcl] Other (See Comments)    Tick    Social History   Socioeconomic History  . Marital status: Married    Spouse name: Not on file  . Number of children: Not on file  . Years of education: Not on file  . Highest education level: Not on file  Occupational History  . Not on file  Tobacco Use  . Smoking status: Former Smoker    Packs/day: 2.00    Years: 46.00    Pack years: 92.00    Quit date: 2010    Years since quitting: 11.7  . Smokeless tobacco: Never Used  Vaping Use  . Vaping Use: Never used  Substance and  Sexual Activity  . Alcohol use: Not Currently    Comment: 2-6 drinks a week  . Drug use: No  . Sexual activity: Not on file  Other Topics Concern  . Not on file  Social History Narrative  . Not on file   Social Determinants of Health   Financial Resource Strain:   . Difficulty of Paying Living Expenses: Not on file  Food Insecurity:   . Worried About Charity fundraiser in the Last Year: Not on file  . Ran Out of Food in the Last Year: Not on file  Transportation Needs:   . Lack of Transportation (Medical): Not on file  . Lack of Transportation (Non-Medical): Not on file  Physical Activity:   . Days of Exercise per Week: Not on file  . Minutes of Exercise per Session: Not on file  Stress:   . Feeling of Stress : Not on file  Social Connections:   . Frequency of Communication with Friends and Family: Not on file  . Frequency of Social Gatherings with Friends and Family: Not on file  . Attends Religious Services: Not on file  .  Active Member of Clubs or Organizations: Not on file  . Attends Archivist Meetings: Not on file  . Marital Status: Not on file  Intimate Partner Violence:   . Fear of Current or Ex-Partner: Not on file  . Emotionally Abused: Not on file  . Physically Abused: Not on file  . Sexually Abused: Not on file     ROS- All systems are reviewed and negative except as per the HPI above.  Physical Exam: Vitals:   07/17/20 0859  BP: 118/76  Pulse: (!) 52  Weight: 108 kg  Height: 5\' 10"  (1.778 m)    GEN- The patient is well appearing obese male, alert and oriented x 3 today.   HEENT-head normocephalic, atraumatic, sclera clear, conjunctiva pink, hearing intact, trachea midline. Lungs- Clear to ausculation bilaterally, normal work of breathing Heart- Regular rate and rhythm, bradycardia, no murmurs, rubs or gallops  GI- soft, NT, ND, + BS Extremities- no clubbing, cyanosis, or edema MS- no significant deformity or atrophy Skin- no rash or  lesion Psych- euthymic mood, full affect Neuro- strength and sensation are intact   Wt Readings from Last 3 Encounters:  07/17/20 108 kg  03/31/20 108.9 kg  03/20/20 109.8 kg    EKG today demonstrates SB HR 52, RBBB, PR 148, QRS 156, QTc 438  Echo 10/05/19  1. Left ventricular ejection fraction, by visual estimation, is 65 to 70%. The left ventricle has hyperdynamic function. There is no left ventricular hypertrophy.  2. Left ventricular diastolic function could not be evaluated.  3. Global right ventricle has moderately reduced systolic function.The right ventricular size is normal. No increase in right ventricular wall thickness.  4. Left atrial size was severely dilated.  5. Right atrial size was moderately dilated.  6. The mitral valve is normal in structure. Mild mitral valve regurgitation.  7. The tricuspid valve is normal in structure. Tricuspid valve regurgitation is mild.  8. The aortic valve is normal in structure. Aortic valve regurgitation is not visualized. No evidence of aortic valve sclerosis or stenosis.  9. The pulmonic valve was normal in structure. Pulmonic valve regurgitation is not visualized. 10. Aortic dilatation noted. 11. 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 12. Normal pulmonary artery systolic pressure. 13. The atrial septum is grossly normal.  Epic records are reviewed at length today  Assessment and Plan:  1. Persistent atrial fibrillation Patient appears to be maintaining SR.  Continue amiodarone 100 mg daily.  Check cmet/TSH today Continue Eliquis 5 mg BID  This patients CHA2DS2-VASc Score and unadjusted Ischemic Stroke Rate (% per year) is equal to 2.2 % stroke rate/year from a score of 2  Above score calculated as 1 point each if present [CHF, HTN, DM, Vascular=MI/PAD/Aortic Plaque, Age if 65-74, or Male] Above score calculated as 2 points each if present [Age > 75, or Stroke/TIA/TE]  2. Obesity Body mass  index is 34.18 kg/m. Lifestyle modification was discussed and encouraged including regular physical activity and weight reduction.  3. OSA Dx with moderate OSA. Did not follow through with CPAP titration.  4. CAD Coronary artery calcification and aortic atherosclerosis seen on chest CT. Myoview 12/20/19 normal. No anginal symptoms.  5. Bradycardia Asymptomatic, no change today.   Follow up in the AF clinic in 6 months.    [facility] [address] [place], [place] [address] [phone] 07/17/2020 10:01 AM

## 2020-07-18 ENCOUNTER — Encounter: Payer: Self-pay | Admitting: Family Medicine

## 2020-07-26 ENCOUNTER — Encounter: Payer: Self-pay | Admitting: Neurology

## 2020-07-26 ENCOUNTER — Other Ambulatory Visit: Payer: Self-pay

## 2020-07-26 ENCOUNTER — Ambulatory Visit: Payer: Medicare HMO | Admitting: Neurology

## 2020-07-26 VITALS — BP 110/68 | HR 71 | Ht 71.0 in | Wt 239.0 lb

## 2020-07-26 DIAGNOSIS — R251 Tremor, unspecified: Secondary | ICD-10-CM

## 2020-07-26 NOTE — Patient Instructions (Signed)
Let's see how you do off the Depakote in regards to the tremor  Continue working with your psychiatrist for managing your mood  See you back in 6 months

## 2020-07-26 NOTE — Progress Notes (Signed)
PATIENT: Joshua [name] DOB: 1946/10/05  REASON FOR VISIT: follow up HISTORY FROM: patient  HISTORY OF PRESENT ILLNESS: Today 07/26/20  HISTORY Joshua [name] is a 74 year old male, seen in request by his primary care physician Dr. [name] for evaluation of tremor, initial evaluation was on January 21, 2020.  I have reviewed and summarized the referring note from the referring physician and multiple hospital chart, emergency presentation on September 22, 2020, he presented for shortness of breath, CT chest without evidence of PE, was found to be atrial fibrillation, was started on anticoagulation,  He had DC cardioversion by Dr. Meda Coffee on November 29, 2019, that was successful.  He still anticoagulation Eliquis 5 mg twice a day, amiodarone, he also has history of hyperlipidemia, anger issues, is under the care of psychiatrist at the [facility], was started on Depakote 750 mg at bedtime in 2020 along with Prozac 20 mg, hydroxyzine 10 mg, few months later, around November 2020, he began to notice bilateral hands tremor, involving both hands, right hand seems to be more obvious than the left hand, he is right-handed, now he eats with his left hand, sometimes the shaking was so bad, half of his bones was spilled out, especially when holding on small granulated food, such as rice, peas.  He denies gait abnormality, denied loss sense of smell, no constipation, orthostatic dizziness, he does have vivid dreams, wife reported excessive movement during sleep, his wife also send a note concerning about his gradual increased alcohol intake, patient reported that he can take up to 6 to 8 ounce of hard liquor in certain days, but can go on days without alcohol consumption without craving, he also drinks about 10 ounces of caffeine, he denies family history of tremor  He was started on primidone since January 03, 2019, titrating dose now to 50 mg 5 tablets every night, he noticed mild  improvement, does complains of drowsiness.  Laboratory evaluations since 2020, normal TSH, CMP abnormal kidney function, creatinine of 1.39,, CBC showed decreased WBC, with elevated MCV, elevated free T4 1.24, lipid profile showed LDL 73,  Update July 26, 2020 SS: MRI of the brain in April 2021 showed mild age-related changes, no acute abnormalities.  Vitamin B12, folate, TSH were unremarkable, Depakote level was 76 at last visit.   Tremor came on back in 2020 with initiation of Depakote, at one time was on Depakote 750 mg daily, has been working with his psychiatrist, has reduced the dose down to 250 mg daily, with dose reduction, tremor has improved 25 to 50%, but some of the mood issues have returned.  We will be discontinuing Depakote completely in the near future, switching to a new medication (isn't sure the name). Having ketamine infusion at [facility] soon to help with mood, paying out-of-pocket for this.  Tremor is in both hands, mostly in the left, affects eating.  REVIEW OF SYSTEMS: Out of a complete 14 system review of symptoms, the patient complains only of the following symptoms, and all other reviewed systems are negative.  Tremor  ALLERGIES: Allergies  Allergen Reactions   Venlafaxine Palpitations   Bupropion Other (See Comments)    unknown   Zoloft [Sertraline Hcl] Other (See Comments)    Tick    HOME MEDICATIONS: Outpatient Medications Prior to Visit  Medication Sig Dispense Refill   acetaminophen (TYLENOL) 500 MG tablet Take 1,000 mg by mouth every 6 (six) hours as needed (for pain.).     amiodarone (PACERONE)  200 MG tablet Take 0.5 tablets (100 mg total) by mouth daily.     apixaban (ELIQUIS) 5 MG TABS tablet Take 1 tablet (5 mg total) by mouth 2 (two) times daily. 180 tablet 2   atorvastatin (LIPITOR) 20 MG tablet TAKE ONE TABLET BY MOUTH DAILY 90 tablet 3   clobetasol (TEMOVATE) 0.05 % external solution Apply 1 application topically. Uses Monday and Thursday      divalproex (DEPAKOTE) 250 MG DR tablet Take 250 mg by mouth at bedtime.      FLUoxetine (PROZAC) 20 MG capsule Take 20 mg by mouth daily.      hydrOXYzine (ATARAX/VISTARIL) 10 MG tablet Take 10 mg by mouth as needed.      ketoconazole (NIZORAL) 2 % shampoo Apply 1 application topically 2 (two) times a week.      tamsulosin (FLOMAX) 0.4 MG CAPS capsule Take 0.4 mg by mouth daily.      No facility-administered medications prior to visit.    PAST MEDICAL HISTORY: Past Medical History:  Diagnosis Date   Abnormal findings on diagnostic imaging of lung 09/23/2019   09/22/2019-lung cancer screening-centrilobular and paraseptal emphysema, calcified granulomata noted bilaterally, no suspicious nodules, lung RADS 1, repeat in 12 months    Atrial fibrillation ([place]) 09/23/2019   Centrilobular emphysema ([place]) 09/23/2019   09/22/2019-lung cancer screening-centrilobular and paraseptal emphysema, calcified granulomata noted bilaterally, no suspicious nodules, lung RADS 1, repeat in 12 months  09/23/2019-FVC 3.74 (81% predicted), postbronchodilator ratio 80, postbronchodilator FEV1 2.88 (86% predicted), no bronchodilator response, DLCO 19.44 (73% predicted)   DDD (degenerative disc disease), cervical 05/13/2008   Cervical Disc Degeneration   History of adenomatous polyp of colon 03/03/2018   05/14/17: Colonoscopy: nonadvanced adenoma, f/u 5 yrs, use SuPrep, Murphy/GAP  Last Assessment & Plan:  Relevant Hx: Course: Daily Update: Today's Plan:   Hyperlipidemia    Kidney stone    Nephrolithiasis 05/22/2011   Nontraumatic incomplete tear of right rotator cuff 10/14/2018   Other and unspecified hyperlipidemia 11/20/2009   Hyperlipidemia   RBBB 06/09/2012   Secondary hypercoagulable state ([place]) 09/28/2019   Shortness of breath 09/23/2019    PAST SURGICAL HISTORY: Past Surgical History:  Procedure Laterality Date   CARDIOVERSION N/A 10/15/2019   Procedure: CARDIOVERSION;  Surgeon: Jerline Pain, MD;  Location: [facility] ENDOSCOPY;  Service: Cardiovascular;  Laterality: N/A;   CARDIOVERSION N/A 11/29/2019   Procedure: CARDIOVERSION;  Surgeon: Dorothy Spark, MD;  Location: [facility] ENDOSCOPY;  Service: Cardiovascular;  Laterality: N/A;   HEMORRHOID SURGERY      FAMILY HISTORY: Family History  Problem Relation Age of Onset   Varicose Veins Mother    Stroke Father     SOCIAL HISTORY: Social History   Socioeconomic History   Marital status: Married    Spouse name: Not on file   Number of children: Not on file   Years of education: Not on file   Highest education level: Not on file  Occupational History   Not on file  Tobacco Use   Smoking status: Former Smoker    Packs/day: 2.00    Years: 46.00    Pack years: 92.00    Quit date: 2010    Years since quitting: 11.7   Smokeless tobacco: Never Used  Scientific laboratory technician Use: Never used  Substance and Sexual Activity   Alcohol use: Not Currently    Comment: 2-6 drinks a week   Drug use: No   Sexual activity: Not on file  Other Topics  Concern   Not on file  Social History Narrative   Not on file   Social Determinants of Health   Financial Resource Strain:    Difficulty of Paying Living Expenses: Not on file  Food Insecurity:    Worried About [place] in the Last Year: Not on file   Ran Out of Food in the Last Year: Not on file  Transportation Needs:    Lack of Transportation (Medical): Not on file   Lack of Transportation (Non-Medical): Not on file  Physical Activity:    Days of Exercise per Week: Not on file   Minutes of Exercise per Session: Not on file  Stress:    Feeling of Stress : Not on file  Social Connections:    Frequency of Communication with Friends and Family: Not on file   Frequency of Social Gatherings with Friends and Family: Not on file   Attends Religious Services: Not on file   Active Member of Clubs or Organizations: Not on file   Attends Theatre manager Meetings: Not on file   Marital Status: Not on file  Intimate Partner Violence:    Fear of Current or Ex-Partner: Not on file   Emotionally Abused: Not on file   Physically Abused: Not on file   Sexually Abused: Not on file   PHYSICAL EXAM  Vitals:   07/26/20 0905  BP: 110/68  Pulse: 71  Weight: 239 lb (108.4 kg)  Height: 5\' 11"  (1.803 m)   Body mass index is 33.33 kg/m.  Generalized: Well developed, in no acute distress   Neurological examination  Mentation: Alert oriented to time, place, history taking. Follows all commands speech and language fluent Cranial nerve II-XII: Pupils were equal round reactive to light. Extraocular movements were full, visual field were full on confrontational test. Facial sensation and strength were normal.  Head turning and shoulder shrug  were normal and symmetric. Motor: The motor testing reveals 5 over 5 strength of all 4 extremities. Good symmetric motor tone is noted throughout.  Mild bilateral hand postural tremor, greater on the left than right.  No rigidity or bradykinesia. Sensory: Sensory testing is intact to soft touch on all 4 extremities. No evidence of extinction is noted.  Coordination: Cerebellar testing reveals good finger-nose-finger and heel-to-shin bilaterally. Mild tremor with finger nose finger bilaterally. Gait and station: Gait is normal, normal steps, arm swing Reflexes: Deep tendon reflexes are symmetric and normal bilaterally.   DIAGNOSTIC DATA (LABS, IMAGING, TESTING) - I reviewed patient records, labs, notes, testing and imaging myself where available.  Lab Results  Component Value Date   WBC 3.5 (L) 11/22/2019   HGB 14.4 11/22/2019   HCT 43.8 11/22/2019   MCV 100.5 (H) 11/22/2019   PLT 175 11/22/2019      Component Value Date/Time   NA 140 07/17/2020 1017   K 3.9 07/17/2020 1017   CL 106 07/17/2020 1017   CO2 21 (L) 07/17/2020 1017   GLUCOSE 102 (H) 07/17/2020 1017   BUN 19 07/17/2020 1017    CREATININE 1.26 (H) 07/17/2020 1017   CALCIUM 9.0 07/17/2020 1017   PROT 6.8 07/17/2020 1017   ALBUMIN 3.5 07/17/2020 1017   AST 35 07/17/2020 1017   ALT 26 07/17/2020 1017   ALKPHOS 81 07/17/2020 1017   BILITOT 0.7 07/17/2020 1017   GFRNONAA 57 (L) 07/17/2020 1017   GFRAA >60 07/17/2020 1017   Lab Results  Component Value Date   CHOL 139 12/10/2019  HDL 47 12/10/2019   LDLCALC 73 12/10/2019   TRIG 100 12/10/2019   CHOLHDL 3.0 12/10/2019   No results found for: HGBA1C Lab Results  Component Value Date   VITAMINB12 595 01/21/2020   Lab Results  Component Value Date   TSH 2.730 07/17/2020      ASSESSMENT AND PLAN 74 y.o. year old male  has a past medical history of Abnormal findings on diagnostic imaging of lung (09/23/2019), Atrial fibrillation ([place]) (09/23/2019), Centrilobular emphysema ([place]) (09/23/2019), DDD (degenerative disc disease), cervical (05/13/2008), History of adenomatous polyp of colon (03/03/2018), Hyperlipidemia, Kidney stone, Nephrolithiasis (05/22/2011), Nontraumatic incomplete tear of right rotator cuff (10/14/2018), Other and unspecified hyperlipidemia (11/20/2009), RBBB (06/09/2012), Secondary hypercoagulable state ([place]) (09/28/2019), and Shortness of breath (09/23/2019). here with:  1.  Bilateral hand tremor -MRI of the brain showed mild age-related changes, no acute abnormalities -Laboratory evaluation, B12, folate, TSH were unremarkable, Depakote level was 76 -Now on lower dose Depakote 250 mg daily, tremor is 25-50% improved, working with psychiatrist to completely discontinue, will be switching to a new medication, unsure the name? -Tremor came on with initiation of Depakote, hopefully with discontinuation, the tremor will continue to improve -No signs of Parkinson's on exam today -Will follow-up in 6 to 12 months  I spent 30 minutes of face-to-face and non-face-to-face time with patient.  This included previsit chart review, lab review, study review,  order entry, electronic health record documentation, patient education.  Butler Denmark, AGNP-C, DNP 07/26/2020, 9:39 AM [facility] [address], [place], [place] [address] [phone]

## 2020-09-07 ENCOUNTER — Other Ambulatory Visit: Payer: Self-pay

## 2020-09-07 ENCOUNTER — Ambulatory Visit (INDEPENDENT_AMBULATORY_CARE_PROVIDER_SITE_OTHER): Payer: Medicare HMO | Admitting: Family Medicine

## 2020-09-07 ENCOUNTER — Encounter: Payer: Self-pay | Admitting: Family Medicine

## 2020-09-07 VITALS — BP 124/68 | HR 57 | Temp 98.0°F | Wt 235.9 lb

## 2020-09-07 DIAGNOSIS — I700 Atherosclerosis of aorta: Secondary | ICD-10-CM

## 2020-09-07 DIAGNOSIS — E782 Mixed hyperlipidemia: Secondary | ICD-10-CM | POA: Diagnosis not present

## 2020-09-07 DIAGNOSIS — I4891 Unspecified atrial fibrillation: Secondary | ICD-10-CM

## 2020-09-07 DIAGNOSIS — G250 Essential tremor: Secondary | ICD-10-CM | POA: Diagnosis not present

## 2020-09-07 DIAGNOSIS — F3341 Major depressive disorder, recurrent, in partial remission: Secondary | ICD-10-CM | POA: Diagnosis not present

## 2020-09-07 DIAGNOSIS — I10 Essential (primary) hypertension: Secondary | ICD-10-CM

## 2020-09-07 DIAGNOSIS — J432 Centrilobular emphysema: Secondary | ICD-10-CM | POA: Diagnosis not present

## 2020-09-07 DIAGNOSIS — D6869 Other thrombophilia: Secondary | ICD-10-CM | POA: Diagnosis not present

## 2020-09-07 DIAGNOSIS — D692 Other nonthrombocytopenic purpura: Secondary | ICD-10-CM | POA: Diagnosis not present

## 2020-09-07 NOTE — Assessment & Plan Note (Signed)
Improved with discontinuing depakote.

## 2020-09-07 NOTE — Assessment & Plan Note (Signed)
Managed by psychiatry through the [place], stable at this time with fluoxetine.

## 2020-09-07 NOTE — Patient Instructions (Signed)
Great to see you today! Continue current medications and see me again in 6 months.

## 2020-09-07 NOTE — Assessment & Plan Note (Signed)
Tolerating atorvastatin well.  Continue.  Will check lipid panel again at next follow up.

## 2020-09-07 NOTE — Assessment & Plan Note (Signed)
Eliquis is likely contributing.  Reassured and he will let me know of any worsening.

## 2020-09-07 NOTE — Assessment & Plan Note (Signed)
Noted on previous CT scan.  Mild in nature, continue to maintain good control of BP and statin use.  He has quit smoking at this point.

## 2020-09-07 NOTE — Progress Notes (Signed)
Joshua [name] - 74 y.o. male MRN [id number]  Date of birth: 10-Aug-1946  Subjective Chief Complaint  Patient presents with  . Follow-up    HPI Joshua [name] is a 74 y.o. male here today for follow of the following:  -A. Fib:  He is followed by cardiology.  Has has cardioversion x2 and has done well since that.  He is rhythm controlled with amiodarone and anticoagulated with eliquis.  He is having some minor bruising.  He denies symptoms including palpitations, chest pain, shortness of breath, or fatigue.   -HLD: He continues to tolerate atorvastatin well.  Last LDL 73  -Depression:  Current management with fluoxetine.  He is doing well with this and denies side effects.  He is followed by psychiatry through the [facility] for management of medication.   -Tremor:  He was previously on Depakote which seemed to worsen tremor. He has stopped depakote at this time.  He has been able to discontinue primidone as well.  He is seeing neurology for this.  He also has some mild intermittent R ankle pain.  Previous fracture in adolescence.  No swelling or redness.   ROS:  A comprehensive ROS was completed and negative except as noted per HPI    Allergies  Allergen Reactions  . Venlafaxine Palpitations  . Bupropion Other (See Comments)    unknown  . Zoloft [Sertraline Hcl] Other (See Comments)    Tick    Past Medical History:  Diagnosis Date  . Abnormal findings on diagnostic imaging of lung 09/23/2019   09/22/2019-lung cancer screening-centrilobular and paraseptal emphysema, calcified granulomata noted bilaterally, no suspicious nodules, lung RADS 1, repeat in 12 months   . Atrial fibrillation ([place]) 09/23/2019  . Centrilobular emphysema ([place]) 09/23/2019   09/22/2019-lung cancer screening-centrilobular and paraseptal emphysema, calcified granulomata noted bilaterally, no suspicious nodules, lung RADS 1, repeat in 12 months  09/23/2019-FVC 3.74 (81% predicted), postbronchodilator ratio  80, postbronchodilator FEV1 2.88 (86% predicted), no bronchodilator response, DLCO 19.44 (73% predicted)  . DDD (degenerative disc disease), cervical 05/13/2008   Cervical Disc Degeneration  . History of adenomatous polyp of colon 03/03/2018   05/14/17: Colonoscopy: nonadvanced adenoma, f/u 5 yrs, use SuPrep, [place]/GAP  Last Assessment & Plan:  Relevant Hx: Course: Daily Update: Today's Plan:  Marland Kitchen Hyperlipidemia   . Kidney stone   . Nephrolithiasis 05/22/2011  . Nontraumatic incomplete tear of right rotator cuff 10/14/2018  . Other and unspecified hyperlipidemia 11/20/2009   Hyperlipidemia  . RBBB 06/09/2012  . Secondary hypercoagulable state ([place]) 09/28/2019  . Shortness of breath 09/23/2019    Past Surgical History:  Procedure Laterality Date  . CARDIOVERSION N/A 10/15/2019   Procedure: CARDIOVERSION;  Surgeon: Jerline Pain, MD;  Location: [facility] ENDOSCOPY;  Service: Cardiovascular;  Laterality: N/A;  . CARDIOVERSION N/A 11/29/2019   Procedure: CARDIOVERSION;  Surgeon: Dorothy Spark, MD;  Location: [facility] ENDOSCOPY;  Service: Cardiovascular;  Laterality: N/A;  . HEMORRHOID SURGERY      Social History   Socioeconomic History  . Marital status: Married    Spouse name: Not on file  . Number of children: Not on file  . Years of education: Not on file  . Highest education level: Not on file  Occupational History  . Not on file  Tobacco Use  . Smoking status: Former Smoker    Packs/day: 2.00    Years: 46.00    Pack years: 92.00    Quit date: 2010    Years since quitting: 11.8  .  Smokeless tobacco: Never Used  Vaping Use  . Vaping Use: Never used  Substance and Sexual Activity  . Alcohol use: Not Currently    Comment: 2-6 drinks a week  . Drug use: No  . Sexual activity: Not on file  Other Topics Concern  . Not on file  Social History Narrative  . Not on file   Social Determinants of Health   Financial Resource Strain:   . Difficulty of Paying Living Expenses: Not on file   Food Insecurity:   . Worried About Charity fundraiser in the Last Year: Not on file  . Ran Out of Food in the Last Year: Not on file  Transportation Needs:   . Lack of Transportation (Medical): Not on file  . Lack of Transportation (Non-Medical): Not on file  Physical Activity:   . Days of Exercise per Week: Not on file  . Minutes of Exercise per Session: Not on file  Stress:   . Feeling of Stress : Not on file  Social Connections:   . Frequency of Communication with Friends and Family: Not on file  . Frequency of Social Gatherings with Friends and Family: Not on file  . Attends Religious Services: Not on file  . Active Member of Clubs or Organizations: Not on file  . Attends Archivist Meetings: Not on file  . Marital Status: Not on file    Family History  Problem Relation Age of Onset  . Varicose Veins Mother   . Stroke Father     Health Maintenance  Topic Date Due  . Hepatitis C Screening  Never done  . TETANUS/TDAP  01/31/2026  . COLONOSCOPY  05/15/2027  . INFLUENZA VACCINE  Completed  . COVID-19 Vaccine  Completed  . PNA vac Low Risk Adult  Completed     ----------------------------------------------------------------------------------------------------------------------------------------------------------------------------------------------------------------- Physical Exam BP 124/68 (BP Location: Left Arm, Patient Position: Sitting, Cuff Size: Large)   Pulse (!) 57   Temp 98 F (36.7 C)   Wt 235 lb 14.4 oz (107 kg)   SpO2 95%   BMI 32.90 kg/m   Physical Exam Constitutional:      Appearance: Normal appearance.  Eyes:     General: No scleral icterus. Cardiovascular:     Rate and Rhythm: Normal rate and regular rhythm.  Pulmonary:     Breath sounds: Normal breath sounds.  Musculoskeletal:     Cervical back: Neck supple.  Neurological:     General: No focal deficit present.     Mental Status: He is alert.  Psychiatric:        Mood and  Affect: Mood normal.        Behavior: Behavior normal.    EKG interpretation:  Sinus bradycardia with rate of 53BPM.  RBBB.  No significant change compared to 07/17/20 and 03/31/20 tracings.   ------------------------------------------------------------------------------------------------------------------------------------------------------------------------------------------------------------------- Assessment and Plan  Essential hypertension Blood pressure is at goal at for age and co-morbidities.  I recommend continual monitoring at home.  In addition they were instructed to follow a low sodium diet with regular exercise to help to maintain adequate control of blood pressure.    Atrial fibrillation (HCC) RRR on exam.  No symptoms at this time.  EKG without changes from previous tracing.  He will continue to see cardiology for management of amiodarone and eliquis.   Centrilobular emphysema (HCC) Stable, no increased dyspnea.  Has annual lung cancer screenings.    Essential tremor Improved with discontinuing depakote.   Secondary hypercoagulable state ([place])  He is on eliquis for history of A. Fib.   Mixed hyperlipidemia Tolerating atorvastatin well.  Continue.  Will check lipid panel again at next follow up.    Major depressive disorder Managed by psychiatry through the [place], stable at this time with fluoxetine.   Senile purpura (HCC) Eliquis is likely contributing.  Reassured and he will let me know of any worsening.   Aortic atherosclerosis ([place]) Noted on previous CT scan.  Mild in nature, continue to maintain good control of BP and statin use.  He has quit smoking at this point.     No orders of the defined types were placed in this encounter.   Return in about 6 months (around 03/07/2021) for HTN.    This visit occurred during the SARS-CoV-2 public health emergency.  Safety protocols were in place, including screening questions prior to the visit, additional usage of  staff PPE, and extensive cleaning of exam room while observing appropriate contact time as indicated for disinfecting solutions.

## 2020-09-07 NOTE — Assessment & Plan Note (Signed)
Stable, no increased dyspnea.  Has annual lung cancer screenings.

## 2020-09-07 NOTE — Assessment & Plan Note (Signed)
RRR on exam.  No symptoms at this time.  EKG without changes from previous tracing.  He will continue to see cardiology for management of amiodarone and eliquis.

## 2020-09-07 NOTE — Assessment & Plan Note (Signed)
Blood pressure is at goal at for age and co-morbidities.  I recommend continual monitoring at home.  In addition they were instructed to follow a low sodium diet with regular exercise to help to maintain adequate control of blood pressure.

## 2020-09-07 NOTE — Assessment & Plan Note (Signed)
He is on eliquis for history of A. Fib.

## 2020-09-12 ENCOUNTER — Ambulatory Visit: Payer: Medicare HMO

## 2020-09-22 ENCOUNTER — Other Ambulatory Visit: Payer: Self-pay

## 2020-09-22 ENCOUNTER — Ambulatory Visit (INDEPENDENT_AMBULATORY_CARE_PROVIDER_SITE_OTHER): Payer: Medicare HMO

## 2020-09-22 DIAGNOSIS — Z87891 Personal history of nicotine dependence: Secondary | ICD-10-CM

## 2020-09-22 DIAGNOSIS — Z122 Encounter for screening for malignant neoplasm of respiratory organs: Secondary | ICD-10-CM

## 2020-09-26 NOTE — Progress Notes (Signed)
Please call patient and let them  know their  low dose Ct was read as a Lung RADS 1, negative study: no nodules or definitely benign nodules. Radiology recommendation is for a repeat LDCT in 12 months. .Please let them  know we will order and schedule their  annual screening scan for 09/2021. Please let them  know there was notation of CAD on their  scan.  Please remind the patient  that this is a non-gated exam therefore degree or severity of disease  cannot be determined. Please have them  follow up with their PCP regarding potential risk factor modification, dietary therapy or pharmacologic therapy if clinically indicated. Pt.  is  currently on statin therapy. Please place order for annual  screening scan for  09/2021 and fax results to PCP. Thanks so much.  Dr. Valeta Harms, just wanted to make sure you were aware of the similar appearance of ILD as previous study. Let me know if you want to do an HRCT. Thanks

## 2020-09-29 ENCOUNTER — Telehealth: Payer: Self-pay | Admitting: Acute Care

## 2020-09-29 DIAGNOSIS — R0602 Shortness of breath: Secondary | ICD-10-CM

## 2020-09-29 DIAGNOSIS — R0609 Other forms of dyspnea: Secondary | ICD-10-CM

## 2020-09-29 DIAGNOSIS — R0600 Dyspnea, unspecified: Secondary | ICD-10-CM

## 2020-10-02 ENCOUNTER — Other Ambulatory Visit: Payer: Self-pay

## 2020-10-02 ENCOUNTER — Telehealth: Payer: Self-pay | Admitting: Cardiology

## 2020-10-02 DIAGNOSIS — N200 Calculus of kidney: Secondary | ICD-10-CM | POA: Insufficient documentation

## 2020-10-02 DIAGNOSIS — E785 Hyperlipidemia, unspecified: Secondary | ICD-10-CM | POA: Insufficient documentation

## 2020-10-02 NOTE — Telephone Encounter (Signed)
Pt informed of CT results per Eric Form, NP.  PT verbalized understanding.  Copy sent to PCPand Dr Harriet Masson.  Order placed for 1 yr f/u HRCT per Dr Valeta Harms and Eric Form, NP.

## 2020-10-02 NOTE — Telephone Encounter (Signed)
Spoke to the patient just now and he was very upset in regards to his Chest CT results. He states that he was told by his PCP that he has an enlarged heart. He is wondering why he has not received a call from Dr. Harriet Masson yet and I advised him that this is the first we have heard anything in regards to this CT but I would forward it to Dr. Harriet Masson to review. The patient is very rude to me over the phone as he feels that Dr. Harriet Masson should have already seen this Chest CT result and should have already been aware of the situation. He states that we should have called him on Friday and I advised that we were closed on Thursday and Friday for Thanksgiving. He is requesting a call back before 5 pm today. I told him that I was not sure if this was possible due to Dr. Harriet Masson being in clinic. He goes on to tell me how upset he is that he has not received a call in regards to this and is upset with his PCP as well.   I will forward to Dr. Harriet Masson to review for the patient.

## 2020-10-02 NOTE — Telephone Encounter (Signed)
Joshua [name] is calling stating he was advised to call Dr. Terrial Rhodes office in regards to his CT results and have her advise where she wants to go from here. Please advise.

## 2020-10-02 NOTE — Telephone Encounter (Signed)
LMTC x 1  

## 2020-10-02 NOTE — Telephone Encounter (Signed)
I was able to speak to the patient. He had questions about the CT scan that was done with his pulmonary doctor.  Also been explained to the patient that we had had echocardiograms in the past which showed normal ejection fraction as well as no cardiomyopathy or LVH.  He says he does have some shortness of breath I am going to see the patient in the office hopefully this week will be able to assess him appropriately and any need for further testing.

## 2020-10-03 ENCOUNTER — Ambulatory Visit (INDEPENDENT_AMBULATORY_CARE_PROVIDER_SITE_OTHER): Payer: Medicare HMO | Admitting: Cardiology

## 2020-10-03 ENCOUNTER — Encounter: Payer: Self-pay | Admitting: Cardiology

## 2020-10-03 ENCOUNTER — Other Ambulatory Visit: Payer: Self-pay

## 2020-10-03 VITALS — BP 124/80 | HR 69 | Ht 70.0 in | Wt 231.0 lb

## 2020-10-03 DIAGNOSIS — E782 Mixed hyperlipidemia: Secondary | ICD-10-CM

## 2020-10-03 DIAGNOSIS — I700 Atherosclerosis of aorta: Secondary | ICD-10-CM

## 2020-10-03 DIAGNOSIS — I480 Paroxysmal atrial fibrillation: Secondary | ICD-10-CM

## 2020-10-03 DIAGNOSIS — R079 Chest pain, unspecified: Secondary | ICD-10-CM | POA: Diagnosis not present

## 2020-10-03 DIAGNOSIS — R0602 Shortness of breath: Secondary | ICD-10-CM

## 2020-10-03 MED ORDER — ATORVASTATIN CALCIUM 40 MG PO TABS: 40.0000 mg | ORAL_TABLET | Freq: Every day | ORAL | 3 refills | Status: DC

## 2020-10-03 MED ORDER — METOPROLOL TARTRATE 100 MG PO TABS: ORAL_TABLET | ORAL | 0 refills | Status: DC

## 2020-10-03 NOTE — Progress Notes (Signed)
Cardiology Office Note:    Date:  10/03/2020   ID:  Joshua [name], DOB 1946/06/09, MRN [id number]  PCP:  [name], DO  Cardiologist:  Berniece Salines, DO  Electrophysiologist:  None   Referring MD: [name], DO   " I am having some shortness of breath"  History of Present Illness:    Joshua [name] is a 74 y.o. male with a hx of atrial fibrillation status post DC cardioversion on November 29, 2019 and October 15, 2019 now on amiodarone and Eliquis. The patient is here today for follow-up visit he tells me that he has been experiencing significant shortness of breath on exertion. He notes that after his cardioversion he had been doing well. But also reports that the shortness of breath is different from when he was in atrial fibrillation. He denies any chest pain. The patient is also concerned giving he recently had a CT scan by his pulmonary office which showed borderline cardiomegaly and is concerned. There is evidence of significant coronary calcification.  He tells me the shortness of breath is bothersome is intermittent and mostly worse at night. Nothing really brings it on.  Past Medical History:  Diagnosis Date  . Abnormal findings on diagnostic imaging of lung 09/23/2019   09/22/2019-lung cancer screening-centrilobular and paraseptal emphysema, calcified granulomata noted bilaterally, no suspicious nodules, lung RADS 1, repeat in 12 months   . Atrial fibrillation ([place]) 09/23/2019  . Centrilobular emphysema ([place]) 09/23/2019   09/22/2019-lung cancer screening-centrilobular and paraseptal emphysema, calcified granulomata noted bilaterally, no suspicious nodules, lung RADS 1, repeat in 12 months  09/23/2019-FVC 3.74 (81% predicted), postbronchodilator ratio 80, postbronchodilator FEV1 2.88 (86% predicted), no bronchodilator response, DLCO 19.44 (73% predicted)  . DDD (degenerative disc disease), cervical 05/13/2008   Cervical Disc Degeneration  . History of  adenomatous polyp of colon 03/03/2018   05/14/17: Colonoscopy: nonadvanced adenoma, f/u 5 yrs, use SuPrep, [place]/GAP  Last Assessment & Plan:  Relevant Hx: Course: Daily Update: Today's Plan:  Marland Kitchen Hyperlipidemia   . Kidney stone   . Nephrolithiasis 05/22/2011  . Nontraumatic incomplete tear of right rotator cuff 10/14/2018  . Other and unspecified hyperlipidemia 11/20/2009   Hyperlipidemia  . RBBB 06/09/2012  . Secondary hypercoagulable state ([place]) 09/28/2019  . Shortness of breath 09/23/2019    Past Surgical History:  Procedure Laterality Date  . CARDIOVERSION N/A 10/15/2019   Procedure: CARDIOVERSION;  Surgeon: Jerline Pain, MD;  Location: [facility] ENDOSCOPY;  Service: Cardiovascular;  Laterality: N/A;  . CARDIOVERSION N/A 11/29/2019   Procedure: CARDIOVERSION;  Surgeon: Dorothy Spark, MD;  Location: [facility] ENDOSCOPY;  Service: Cardiovascular;  Laterality: N/A;  . HEMORRHOID SURGERY      Current Medications: Current Meds  Medication Sig  . acetaminophen (TYLENOL) 500 MG tablet Take 1,000 mg by mouth every 6 (six) hours as needed (for pain.).  Marland Kitchen amiodarone (PACERONE) 200 MG tablet Take 0.5 tablets (100 mg total) by mouth daily.  Marland Kitchen apixaban (ELIQUIS) 5 MG TABS tablet Take 1 tablet (5 mg total) by mouth 2 (two) times daily.  . clobetasol (TEMOVATE) 0.05 % external solution Apply 1 application topically. Uses Monday and Thursday  . FLUoxetine (PROZAC) 20 MG capsule Take 20 mg by mouth daily.   Marland Kitchen FLUZONE HIGH-DOSE QUADRIVALENT 0.7 ML SUSY   . gabapentin (NEURONTIN) 100 MG capsule Take 100 mg by mouth daily.  Marland Kitchen ketoconazole (NIZORAL) 2 % shampoo Apply 1 application topically 2 (two) times a week.   . tamsulosin (FLOMAX) 0.4 MG  CAPS capsule Take 0.4 mg by mouth daily.   . [DISCONTINUED] atorvastatin (LIPITOR) 20 MG tablet TAKE ONE TABLET BY MOUTH DAILY     Allergies:   Venlafaxine, Bupropion, and Zoloft [sertraline hcl]   Social History   Socioeconomic History  . Marital status: Married     Spouse name: Not on file  . Number of children: Not on file  . Years of education: Not on file  . Highest education level: Not on file  Occupational History  . Not on file  Tobacco Use  . Smoking status: Former Smoker    Packs/day: 2.00    Years: 46.00    Pack years: 92.00    Quit date: 2010    Years since quitting: 11.9  . Smokeless tobacco: Never Used  Vaping Use  . Vaping Use: Never used  Substance and Sexual Activity  . Alcohol use: Not Currently    Comment: 2-6 drinks a week  . Drug use: No  . Sexual activity: Not on file  Other Topics Concern  . Not on file  Social History Narrative  . Not on file   Social Determinants of Health   Financial Resource Strain:   . Difficulty of Paying Living Expenses: Not on file  Food Insecurity:   . Worried About Charity fundraiser in the Last Year: Not on file  . Ran Out of Food in the Last Year: Not on file  Transportation Needs:   . Lack of Transportation (Medical): Not on file  . Lack of Transportation (Non-Medical): Not on file  Physical Activity:   . Days of Exercise per Week: Not on file  . Minutes of Exercise per Session: Not on file  Stress:   . Feeling of Stress : Not on file  Social Connections:   . Frequency of Communication with Friends and Family: Not on file  . Frequency of Social Gatherings with Friends and Family: Not on file  . Attends Religious Services: Not on file  . Active Member of Clubs or Organizations: Not on file  . Attends Archivist Meetings: Not on file  . Marital Status: Not on file     Family History: The patient's family history includes Stroke in his father; Varicose Veins in his mother.  ROS:   Review of Systems  Constitution: Negative for decreased appetite, fever and weight gain.  HENT: Negative for congestion, ear discharge, hoarse voice and sore throat.   Eyes: Negative for discharge, redness, vision loss in right eye and visual halos.  Cardiovascular: Negative for chest  pain, dyspnea on exertion, leg swelling, orthopnea and palpitations.  Respiratory: Negative for cough, hemoptysis, shortness of breath and snoring.   Endocrine: Negative for heat intolerance and polyphagia.  Hematologic/Lymphatic: Negative for bleeding problem. Does not bruise/bleed easily.  Skin: Negative for flushing, nail changes, rash and suspicious lesions.  Musculoskeletal: Negative for arthritis, joint pain, muscle cramps, myalgias, neck pain and stiffness.  Gastrointestinal: Negative for abdominal pain, bowel incontinence, diarrhea and excessive appetite.  Genitourinary: Negative for decreased libido, genital sores and incomplete emptying.  Neurological: Negative for brief paralysis, focal weakness, headaches and loss of balance.  Psychiatric/Behavioral: Negative for altered mental status, depression and suicidal ideas.  Allergic/Immunologic: Negative for HIV exposure and persistent infections.    EKGs/Labs/Other Studies Reviewed:    The following studies were reviewed today:   EKG: None today but recent EKG in September 17, 2020 showed evidence of sinus rhythm.   Recent Labs: 11/22/2019: Hemoglobin 14.4; Platelets 175  07/17/2020: ALT 26; BUN 19; Creatinine, Ser 1.26; Potassium 3.9; Sodium 140; TSH 2.730  Recent Lipid Panel    Component Value Date/Time   CHOL 139 12/10/2019 0948   TRIG 100 12/10/2019 0948   HDL 47 12/10/2019 0948   CHOLHDL 3.0 12/10/2019 0948   LDLCALC 73 12/10/2019 0948    Physical Exam:    VS:  BP 124/80   Pulse 69   Ht 5\' 10"  (1.778 m)   Wt 231 lb (104.8 kg)   SpO2 95%   BMI 33.15 kg/m     Wt Readings from Last 3 Encounters:  10/03/20 231 lb (104.8 kg)  09/07/20 235 lb 14.4 oz (107 kg)  07/26/20 239 lb (108.4 kg)     GEN: Well nourished, well developed in no acute distress HEENT: Normal NECK: No JVD; No carotid bruits LYMPHATICS: No lymphadenopathy CARDIAC: S1S2 noted,RRR, no murmurs, rubs, gallops RESPIRATORY:  Clear to auscultation  without rales, wheezing or rhonchi  ABDOMEN: Soft, non-tender, non-distended, +bowel sounds, no guarding. EXTREMITIES: No edema, No cyanosis, no clubbing MUSCULOSKELETAL:  No deformity  SKIN: Warm and dry NEUROLOGIC:  Alert and oriented x 3, non-focal PSYCHIATRIC:  Normal affect, good insight  ASSESSMENT:    1. Shortness of breath   2. Aortic atherosclerosis (HCC)   3. Chest pain of uncertain etiology   4. PAF (paroxysmal atrial fibrillation) ([place])   5. Mixed hyperlipidemia    PLAN:     His shortness of breath could be an angina equivalent given his risk factor. He had a nuclear stress test back in February which was normal. Giving his CT scan concerning of significant coronary calcification and now his symptoms we will proceed with a coronary CTA in this patient. Educated patient about the testing he is agreeable to proceed. In addition we will get an echocardiogram to reassess his LV function/RV function to make sure that he has not had any worsening heart function since his last echo in November 2020. For now he will remain on his same medications his amiodarone as well as his Eliquis. I discussed with the patient to get kardia mobile which will help him detect his heart rhythm to make sure that he is maintaining his sinus rhythm. All of his questions answered "has been answered in the office today. He also shared with me the lipid profile from the [place] which was elevated we will going to increase his Lipitor to 40 mg daily.  The patient is in agreement with the above plan. The patient left the office in stable condition.  The patient will follow up in 6 months or sooner if needed.   Medication Adjustments/Labs and Tests Ordered: Current medicines are reviewed at length with the patient today.  Concerns regarding medicines are outlined above.  Orders Placed This Encounter  Procedures  . CT CORONARY MORPH W/CTA COR W/SCORE W/CA W/CM &/OR WO/CM  . CT CORONARY FRACTIONAL FLOW RESERVE  DATA PREP  . CT CORONARY FRACTIONAL FLOW RESERVE FLUID ANALYSIS  . ECHOCARDIOGRAM COMPLETE   Meds ordered this encounter  Medications  . atorvastatin (LIPITOR) 40 MG tablet    Sig: Take 1 tablet (40 mg total) by mouth daily.    Dispense:  90 tablet    Refill:  3  . metoprolol tartrate (LOPRESSOR) 100 MG tablet    Sig: Take 1 tablet (100 mg total) 2 hours prior to CT scan.    Dispense:  1 tablet    Refill:  0    Patient Instructions  Medication  Instructions:  Your physician has recommended you make the following change in your medication:  1) INCREASE Lipitor (atorvastatin) to 40 mg daily  *If you need a refill on your cardiac medications before your next appointment, please call your pharmacy*   Testing/Procedures: Your physician has requested that you have an echocardiogram. Echocardiography is a painless test that uses sound waves to create images of your heart. It provides your doctor with information about the size and shape of your heart and how well your heart's chambers and valves are working. This procedure takes approximately one hour. There are no restrictions for this procedure.  Your physician has requested that you have a coronary CTA scan. Please see below for further instructions.   Follow-Up: At [facility], you and your health needs are our priority.  As part of our continuing mission to provide you with exceptional heart care, we have created designated Provider Care Teams.  These Care Teams include your primary Cardiologist (physician) and Advanced Practice Providers (APPs -  Physician Assistants and Nurse Practitioners) who all work together to provide you with the care you need, when you need it.    Your next appointment:   6 month(s)  The format for your next appointment:   In Person  Provider:   Berniece Salines, DO  Other Instructions Cardiac CT Scan:  Please follow these instructions carefully (unless otherwise directed):  Hold all erectile  dysfunction medications at least 3 days (72 hrs) prior to test.  On the Night Before the Test: . Be sure to Drink plenty of water. . Do not consume any caffeinated/decaffeinated beverages or chocolate 12 hours prior to your test. . Do not take any antihistamines 12 hours prior to your test.  On the Day of the Test: . Drink plenty of water. Do not drink any water within one hour of the test. . Do not eat any food 4 hours prior to the test. . You may take your regular medications prior to the test.  . Take metoprolol (Lopressor) two hours prior to test. . HOLD Furosemide/Hydrochlorothiazide morning of the test      After the Test: . Drink plenty of water. . After receiving IV contrast, you may experience a mild flushed feeling. This is normal. . On occasion, you may experience a mild rash up to 24 hours after the test. This is not dangerous. If this occurs, you can take Benadryl 25 mg and increase your fluid intake. . If you experience trouble breathing, this can be serious. If it is severe call 911 IMMEDIATELY. If it is mild, please call our office. . If you take any of these medications: Glipizide/Metformin, Avandament, Glucavance, please do not take 48 hours after completing test unless otherwise instructed.        Adopting a Healthy Lifestyle.  Know what a healthy weight is for you (roughly BMI <25) and aim to maintain this   Aim for 7+ servings of fruits and vegetables daily   65-80+ fluid ounces of water or unsweet tea for healthy kidneys   Limit to max 1 drink of alcohol per day; avoid smoking/tobacco   Limit animal fats in diet for cholesterol and heart health - choose grass fed whenever available   Avoid highly processed foods, and foods high in saturated/trans fats   Aim for low stress - take time to unwind and care for your mental health   Aim for 150 min of moderate intensity exercise weekly for heart health, and weights twice weekly for  bone health   Aim for 7-9  hours of sleep daily   When it comes to diets, agreement about the perfect plan isnt easy to find, even among the experts. Experts at the [employer] developed an idea known as the Healthy Eating Plate. Just imagine a plate divided into logical, healthy portions.   The emphasis is on diet quality:   Load up on vegetables and fruits - one-half of your plate: Aim for color and variety, and remember that potatoes dont count.   Go for whole grains - one-quarter of your plate: Whole wheat, barley, wheat berries, quinoa, oats, brown rice, and foods made with them. If you want pasta, go with whole wheat pasta.   Protein power - one-quarter of your plate: Fish, chicken, beans, and nuts are all healthy, versatile protein sources. Limit red meat.   The diet, however, does go beyond the plate, offering a few other suggestions.   Use healthy plant oils, such as olive, canola, soy, corn, sunflower and peanut. Check the labels, and avoid partially hydrogenated oil, which have unhealthy trans fats.   If youre thirsty, drink water. Coffee and tea are good in moderation, but skip sugary drinks and limit milk and dairy products to one or two daily servings.   The type of carbohydrate in the diet is more important than the amount. Some sources of carbohydrates, such as vegetables, fruits, whole grains, and beans-are healthier than others.   Finally, stay active  Signed, Berniece Salines, DO  10/03/2020 11:21 AM    [place]

## 2020-10-03 NOTE — Patient Instructions (Addendum)
Medication Instructions:  Your physician has recommended you make the following change in your medication:  1) INCREASE Lipitor (atorvastatin) to 40 mg daily  *If you need a refill on your cardiac medications before your next appointment, please call your pharmacy*   Testing/Procedures: Your physician has requested that you have an echocardiogram. Echocardiography is a painless test that uses sound waves to create images of your heart. It provides your doctor with information about the size and shape of your heart and how well your heart's chambers and valves are working. This procedure takes approximately one hour. There are no restrictions for this procedure.  Your physician has requested that you have a coronary CTA scan. Please see below for further instructions.   Follow-Up: At [facility], you and your health needs are our priority.  As part of our continuing mission to provide you with exceptional heart care, we have created designated Provider Care Teams.  These Care Teams include your primary Cardiologist (physician) and Advanced Practice Providers (APPs -  Physician Assistants and Nurse Practitioners) who all work together to provide you with the care you need, when you need it.    Your next appointment:   6 month(s)  The format for your next appointment:   In Person  Provider:   Berniece Salines, DO  Other Instructions Cardiac CT Scan:  Please follow these instructions carefully (unless otherwise directed):  Hold all erectile dysfunction medications at least 3 days (72 hrs) prior to test.  On the Night Before the Test: . Be sure to Drink plenty of water. . Do not consume any caffeinated/decaffeinated beverages or chocolate 12 hours prior to your test. . Do not take any antihistamines 12 hours prior to your test.  On the Day of the Test: . Drink plenty of water. Do not drink any water within one hour of the test. . Do not eat any food 4 hours prior to the test. . You may  take your regular medications prior to the test.  . Take metoprolol (Lopressor) two hours prior to test. . HOLD Furosemide/Hydrochlorothiazide morning of the test      After the Test: . Drink plenty of water. . After receiving IV contrast, you may experience a mild flushed feeling. This is normal. . On occasion, you may experience a mild rash up to 24 hours after the test. This is not dangerous. If this occurs, you can take Benadryl 25 mg and increase your fluid intake. . If you experience trouble breathing, this can be serious. If it is severe call 911 IMMEDIATELY. If it is mild, please call our office. . If you take any of these medications: Glipizide/Metformin, Avandament, Glucavance, please do not take 48 hours after completing test unless otherwise instructed.

## 2020-10-25 ENCOUNTER — Ambulatory Visit (HOSPITAL_BASED_OUTPATIENT_CLINIC_OR_DEPARTMENT_OTHER)
Admission: RE | Admit: 2020-10-25 | Discharge: 2020-10-25 | Disposition: A | Discharge status: Home / Self Care | Payer: Medicare HMO | Source: Ambulatory Visit | Attending: Cardiology | Admitting: Cardiology

## 2020-10-25 ENCOUNTER — Other Ambulatory Visit: Payer: Self-pay

## 2020-10-25 DIAGNOSIS — R0602 Shortness of breath: Secondary | ICD-10-CM | POA: Diagnosis not present

## 2020-10-25 LAB — ECHOCARDIOGRAM COMPLETE
AR max vel: 3.02 cm2
AV Area VTI: 3.06 cm2
AV Area mean vel: 3.49 cm2
AV Mean grad: 3 mmHg
AV Peak grad: 7.4 mmHg
Ao pk vel: 1.36 m/s
Area-P 1/2: 2.45 cm2
Calc EF: 61.5 %
S' Lateral: 3.67 cm
Single Plane A2C EF: 59 %
Single Plane A4C EF: 62.8 %

## 2020-10-26 ENCOUNTER — Telehealth: Payer: Self-pay

## 2020-10-26 DIAGNOSIS — I700 Atherosclerosis of aorta: Secondary | ICD-10-CM | POA: Diagnosis not present

## 2020-10-26 DIAGNOSIS — R0602 Shortness of breath: Secondary | ICD-10-CM | POA: Diagnosis not present

## 2020-10-26 DIAGNOSIS — R079 Chest pain, unspecified: Secondary | ICD-10-CM | POA: Diagnosis not present

## 2020-10-26 NOTE — Telephone Encounter (Signed)
Patient informed of results and voiced understanding.

## 2020-10-26 NOTE — Telephone Encounter (Signed)
-----   Message from Berniece Salines, DO sent at 10/26/2020 10:29 AM EST -----  compared to your last echo there is no significant change.  T

## 2020-10-26 NOTE — Addendum Note (Signed)
Addended by: Truddie Hidden on: 10/26/2020 09:47 AM   Modules accepted: Orders

## 2020-10-27 LAB — BASIC METABOLIC PANEL
BUN/Creatinine Ratio: 14 (ref 10–24)
BUN: 20 mg/dL (ref 8–27)
CO2: 24 mmol/L (ref 20–29)
Calcium: 9.4 mg/dL (ref 8.6–10.2)
Chloride: 106 mmol/L (ref 96–106)
Creatinine, Ser: 1.38 mg/dL — ABNORMAL HIGH (ref 0.76–1.27)
GFR calc Af Amer: 59 mL/min/{1.73_m2} — ABNORMAL LOW (ref >59)
GFR calc non Af Amer: 51 mL/min/{1.73_m2} — ABNORMAL LOW (ref >59)
Glucose: 151 mg/dL — ABNORMAL HIGH (ref 65–99)
Potassium: 4.4 mmol/L (ref 3.5–5.2)
Sodium: 144 mmol/L (ref 134–144)

## 2020-10-30 ENCOUNTER — Other Ambulatory Visit: Payer: Self-pay

## 2020-10-30 ENCOUNTER — Telehealth: Payer: Self-pay | Admitting: Cardiology

## 2020-10-30 DIAGNOSIS — R0602 Shortness of breath: Secondary | ICD-10-CM

## 2020-10-30 DIAGNOSIS — R079 Chest pain, unspecified: Secondary | ICD-10-CM

## 2020-10-30 NOTE — Telephone Encounter (Signed)
follow up:      Patient returning a call to the nurse.

## 2020-10-30 NOTE — Telephone Encounter (Signed)
Results reviewed with pt as per Dr. Tobb's note.  Pt verbalized understanding and had no additional questions. Routed to PCP 

## 2020-10-31 ENCOUNTER — Telehealth (HOSPITAL_COMMUNITY): Payer: Self-pay | Admitting: Emergency Medicine

## 2020-10-31 NOTE — Telephone Encounter (Signed)
Reaching out to patient to offer assistance regarding upcoming cardiac imaging study; pt verbalizes understanding of appt date/time, parking situation and where to check in, pre-test NPO status and medications ordered, and verified current allergies; name and call back number provided for further questions should they arise Rockwell Alexandria RN Navigator Cardiac Imaging [facility] [phone] office [phone] cell   Pt instructed to take 1/2 of 100mg  metoprolol. (he was able to check BP and HR while on phone and HR was 62bpm).  Pt verbalized understanding. 

## 2020-11-02 ENCOUNTER — Other Ambulatory Visit: Payer: Self-pay

## 2020-11-02 ENCOUNTER — Ambulatory Visit (HOSPITAL_COMMUNITY)
Admission: RE | Admit: 2020-11-02 | Discharge: 2020-11-02 | Disposition: A | Discharge status: Home / Self Care | Payer: Medicare HMO | Source: Ambulatory Visit | Attending: Cardiology | Admitting: Cardiology

## 2020-11-02 DIAGNOSIS — R0602 Shortness of breath: Secondary | ICD-10-CM

## 2020-11-02 DIAGNOSIS — I700 Atherosclerosis of aorta: Secondary | ICD-10-CM | POA: Insufficient documentation

## 2020-11-02 DIAGNOSIS — I2510 Atherosclerotic heart disease of native coronary artery without angina pectoris: Secondary | ICD-10-CM | POA: Insufficient documentation

## 2020-11-02 MED ORDER — IOHEXOL 350 MG/ML SOLN
80.0000 mL | Freq: Once | INTRAVENOUS | Status: AC | PRN
  Administered 2020-11-02: 80 mL via INTRAVENOUS

## 2020-11-02 MED ORDER — NITROGLYCERIN 0.4 MG SL SUBL
0.8000 mg | SUBLINGUAL_TABLET | Freq: Once | SUBLINGUAL | Status: AC
  Administered 2020-11-02: 0.8 mg via SUBLINGUAL

## 2020-11-02 MED ORDER — NITROGLYCERIN 0.4 MG SL SUBL
SUBLINGUAL_TABLET | SUBLINGUAL | Status: AC
  Filled 2020-11-02: qty 2

## 2020-11-04 DIAGNOSIS — I700 Atherosclerosis of aorta: Secondary | ICD-10-CM | POA: Diagnosis not present

## 2020-11-06 ENCOUNTER — Telehealth: Payer: Self-pay

## 2020-11-06 NOTE — Telephone Encounter (Signed)
Left message on patients voicemail to please return our call.   

## 2020-11-06 NOTE — Telephone Encounter (Signed)
-----   Message from Thomasene Ripple, DO sent at 11/06/2020  8:15 AM EST ----- Your coronary CTA showed mild coronary artery disease. At this time there is no further need for any intervention. We will continue with medical management. He also to have mild dilatation of the aorta which we are going to monitor annually with imaging/CTs. 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 We can discuss more details at your next visit. But for right now medical management

## 2020-11-07 ENCOUNTER — Telehealth: Payer: Self-pay

## 2020-11-07 NOTE — Telephone Encounter (Signed)
Spoke with patient regarding results and recommendation.  Patient verbalizes understanding and is agreeable to plan of care. Advised patient to call back with any issues or concerns.  

## 2020-11-07 NOTE — Telephone Encounter (Signed)
-----   Message from Kardie Tobb, DO sent at 11/06/2020  8:15 AM EST ----- Your coronary CTA showed mild coronary artery disease. At this time there is no further need for any intervention. We will continue with medical management. He also to have mild dilatation of the aorta which we are going to monitor annually with imaging/CTs. 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 We can discuss more details at your next visit. But for right now medical management 

## 2020-12-08 NOTE — Progress Notes (Signed)
I have reviewed and agreed above plan. 

## 2020-12-12 DIAGNOSIS — Z79899 Other long term (current) drug therapy: Secondary | ICD-10-CM | POA: Diagnosis not present

## 2020-12-12 DIAGNOSIS — E785 Hyperlipidemia, unspecified: Secondary | ICD-10-CM | POA: Diagnosis not present

## 2020-12-12 DIAGNOSIS — N202 Calculus of kidney with calculus of ureter: Secondary | ICD-10-CM | POA: Diagnosis not present

## 2020-12-12 DIAGNOSIS — N3289 Other specified disorders of bladder: Secondary | ICD-10-CM | POA: Diagnosis not present

## 2020-12-12 DIAGNOSIS — Z7901 Long term (current) use of anticoagulants: Secondary | ICD-10-CM | POA: Diagnosis not present

## 2020-12-12 DIAGNOSIS — N200 Calculus of kidney: Secondary | ICD-10-CM | POA: Diagnosis not present

## 2020-12-12 DIAGNOSIS — N201 Calculus of ureter: Secondary | ICD-10-CM | POA: Diagnosis not present

## 2020-12-12 DIAGNOSIS — Z888 Allergy status to other drugs, medicaments and biological substances status: Secondary | ICD-10-CM | POA: Diagnosis not present

## 2020-12-12 DIAGNOSIS — Z87891 Personal history of nicotine dependence: Secondary | ICD-10-CM | POA: Diagnosis not present

## 2020-12-12 DIAGNOSIS — R109 Unspecified abdominal pain: Secondary | ICD-10-CM | POA: Diagnosis not present

## 2020-12-13 DIAGNOSIS — N200 Calculus of kidney: Secondary | ICD-10-CM | POA: Diagnosis not present

## 2020-12-15 DIAGNOSIS — Z79899 Other long term (current) drug therapy: Secondary | ICD-10-CM | POA: Diagnosis not present

## 2020-12-15 DIAGNOSIS — I38 Endocarditis, valve unspecified: Secondary | ICD-10-CM | POA: Diagnosis not present

## 2020-12-15 DIAGNOSIS — N1330 Unspecified hydronephrosis: Secondary | ICD-10-CM | POA: Diagnosis not present

## 2020-12-15 DIAGNOSIS — N179 Acute kidney failure, unspecified: Secondary | ICD-10-CM | POA: Diagnosis not present

## 2020-12-15 DIAGNOSIS — Z7901 Long term (current) use of anticoagulants: Secondary | ICD-10-CM | POA: Diagnosis not present

## 2020-12-15 DIAGNOSIS — N2889 Other specified disorders of kidney and ureter: Secondary | ICD-10-CM | POA: Diagnosis not present

## 2020-12-15 DIAGNOSIS — I340 Nonrheumatic mitral (valve) insufficiency: Secondary | ICD-10-CM | POA: Diagnosis not present

## 2020-12-15 DIAGNOSIS — Z87442 Personal history of urinary calculi: Secondary | ICD-10-CM | POA: Diagnosis not present

## 2020-12-15 DIAGNOSIS — N201 Calculus of ureter: Secondary | ICD-10-CM | POA: Diagnosis not present

## 2020-12-15 DIAGNOSIS — I4891 Unspecified atrial fibrillation: Secondary | ICD-10-CM | POA: Diagnosis not present

## 2020-12-15 DIAGNOSIS — I499 Cardiac arrhythmia, unspecified: Secondary | ICD-10-CM | POA: Diagnosis not present

## 2020-12-15 DIAGNOSIS — M1990 Unspecified osteoarthritis, unspecified site: Secondary | ICD-10-CM | POA: Diagnosis not present

## 2020-12-15 DIAGNOSIS — E785 Hyperlipidemia, unspecified: Secondary | ICD-10-CM | POA: Diagnosis not present

## 2020-12-15 DIAGNOSIS — N132 Hydronephrosis with renal and ureteral calculous obstruction: Secondary | ICD-10-CM | POA: Diagnosis not present

## 2020-12-15 DIAGNOSIS — N138 Other obstructive and reflux uropathy: Secondary | ICD-10-CM | POA: Diagnosis not present

## 2020-12-15 DIAGNOSIS — R112 Nausea with vomiting, unspecified: Secondary | ICD-10-CM | POA: Diagnosis not present

## 2020-12-15 DIAGNOSIS — N401 Enlarged prostate with lower urinary tract symptoms: Secondary | ICD-10-CM | POA: Diagnosis not present

## 2020-12-15 DIAGNOSIS — N200 Calculus of kidney: Secondary | ICD-10-CM | POA: Diagnosis not present

## 2020-12-15 DIAGNOSIS — Z87891 Personal history of nicotine dependence: Secondary | ICD-10-CM | POA: Diagnosis not present

## 2020-12-15 DIAGNOSIS — Z466 Encounter for fitting and adjustment of urinary device: Secondary | ICD-10-CM | POA: Diagnosis not present

## 2020-12-24 DIAGNOSIS — Z01818 Encounter for other preprocedural examination: Secondary | ICD-10-CM | POA: Diagnosis not present

## 2020-12-24 DIAGNOSIS — Z20822 Contact with and (suspected) exposure to covid-19: Secondary | ICD-10-CM | POA: Diagnosis not present

## 2020-12-27 DIAGNOSIS — I4510 Unspecified right bundle-branch block: Secondary | ICD-10-CM | POA: Diagnosis not present

## 2020-12-27 DIAGNOSIS — F32A Depression, unspecified: Secondary | ICD-10-CM | POA: Diagnosis not present

## 2020-12-27 DIAGNOSIS — I4891 Unspecified atrial fibrillation: Secondary | ICD-10-CM | POA: Diagnosis not present

## 2020-12-27 DIAGNOSIS — E785 Hyperlipidemia, unspecified: Secondary | ICD-10-CM | POA: Diagnosis not present

## 2020-12-27 DIAGNOSIS — Z87891 Personal history of nicotine dependence: Secondary | ICD-10-CM | POA: Diagnosis not present

## 2020-12-27 DIAGNOSIS — Z7901 Long term (current) use of anticoagulants: Secondary | ICD-10-CM | POA: Diagnosis not present

## 2020-12-27 DIAGNOSIS — N201 Calculus of ureter: Secondary | ICD-10-CM | POA: Diagnosis not present

## 2020-12-27 DIAGNOSIS — Z888 Allergy status to other drugs, medicaments and biological substances status: Secondary | ICD-10-CM | POA: Diagnosis not present

## 2020-12-27 DIAGNOSIS — Z79899 Other long term (current) drug therapy: Secondary | ICD-10-CM | POA: Diagnosis not present

## 2021-01-09 DIAGNOSIS — L258 Unspecified contact dermatitis due to other agents: Secondary | ICD-10-CM | POA: Diagnosis not present

## 2021-01-15 ENCOUNTER — Other Ambulatory Visit: Payer: Self-pay

## 2021-01-15 ENCOUNTER — Ambulatory Visit (HOSPITAL_COMMUNITY)
Admission: RE | Admit: 2021-01-15 | Discharge: 2021-01-15 | Disposition: A | Discharge status: Home / Self Care | Payer: Medicare HMO | Source: Ambulatory Visit | Attending: Physician Assistant | Admitting: Physician Assistant

## 2021-01-15 ENCOUNTER — Encounter (HOSPITAL_COMMUNITY): Payer: Self-pay | Admitting: Physician Assistant

## 2021-01-15 VITALS — BP 118/64 | HR 53 | Ht 70.0 in | Wt 227.6 lb

## 2021-01-15 DIAGNOSIS — J432 Centrilobular emphysema: Secondary | ICD-10-CM | POA: Insufficient documentation

## 2021-01-15 DIAGNOSIS — Z87891 Personal history of nicotine dependence: Secondary | ICD-10-CM | POA: Insufficient documentation

## 2021-01-15 DIAGNOSIS — D6869 Other thrombophilia: Secondary | ICD-10-CM

## 2021-01-15 DIAGNOSIS — I4510 Unspecified right bundle-branch block: Secondary | ICD-10-CM | POA: Diagnosis not present

## 2021-01-15 DIAGNOSIS — Z7901 Long term (current) use of anticoagulants: Secondary | ICD-10-CM | POA: Insufficient documentation

## 2021-01-15 DIAGNOSIS — Z79899 Other long term (current) drug therapy: Secondary | ICD-10-CM | POA: Insufficient documentation

## 2021-01-15 DIAGNOSIS — I4819 Other persistent atrial fibrillation: Secondary | ICD-10-CM

## 2021-01-15 DIAGNOSIS — I2510 Atherosclerotic heart disease of native coronary artery without angina pectoris: Secondary | ICD-10-CM | POA: Insufficient documentation

## 2021-01-15 DIAGNOSIS — I081 Rheumatic disorders of both mitral and tricuspid valves: Secondary | ICD-10-CM | POA: Diagnosis not present

## 2021-01-15 DIAGNOSIS — Z6832 Body mass index (BMI) 32.0-32.9, adult: Secondary | ICD-10-CM | POA: Insufficient documentation

## 2021-01-15 DIAGNOSIS — E785 Hyperlipidemia, unspecified: Secondary | ICD-10-CM | POA: Insufficient documentation

## 2021-01-15 DIAGNOSIS — R001 Bradycardia, unspecified: Secondary | ICD-10-CM | POA: Insufficient documentation

## 2021-01-15 DIAGNOSIS — J439 Emphysema, unspecified: Secondary | ICD-10-CM | POA: Insufficient documentation

## 2021-01-15 DIAGNOSIS — E669 Obesity, unspecified: Secondary | ICD-10-CM | POA: Insufficient documentation

## 2021-01-15 LAB — COMPREHENSIVE METABOLIC PANEL
ALT: 41 U/L (ref 0–44)
AST: 44 U/L — ABNORMAL HIGH (ref 15–41)
Albumin: 3.8 g/dL (ref 3.5–5.0)
Alkaline Phosphatase: 74 U/L (ref 38–126)
Anion gap: 7 (ref 5–15)
BUN: 19 mg/dL (ref 8–23)
CO2: 26 mmol/L (ref 22–32)
Calcium: 9.5 mg/dL (ref 8.9–10.3)
Chloride: 105 mmol/L (ref 98–111)
Creatinine, Ser: 1.32 mg/dL — ABNORMAL HIGH (ref 0.61–1.24)
GFR, Estimated: 57 mL/min — ABNORMAL LOW (ref >60)
Glucose, Bld: 130 mg/dL — ABNORMAL HIGH (ref 70–99)
Potassium: 4.3 mmol/L (ref 3.5–5.1)
Sodium: 138 mmol/L (ref 135–145)
Total Bilirubin: 1.5 mg/dL — ABNORMAL HIGH (ref 0.3–1.2)
Total Protein: 7 g/dL (ref 6.5–8.1)

## 2021-01-15 LAB — TSH: TSH: 2.335 u[IU]/mL (ref 0.350–4.500)

## 2021-01-15 NOTE — Progress Notes (Signed)
Primary Care Physician: [name], DO Primary Cardiologist: Dr Harriet Masson Primary Electrophysiologist: none Referring Physician: [facility]   Joshua [name] is a 75 y.o. male with a history of paroxysmal atrial fibrillation, emphysema, RBBB, aortic atherosclerosis who presents for follow up in the [facility]. The patient was initially diagnosed with atrial fibrillation in 2013 and saw cardiology at [facility]. More recently, he was seen by pulmonology for increased SOB and was found to be in rate controlled afib. His D-dimer was also noted to be elevated and so he was sent to the ER for evaluation. CTA and ultrasounds negative for PE or DVT. Patient was started on Eliquis for a CHADS2VASC score of 2. Patient reports that in hindsight, his SOB has been steadily getting worse for 2-3 months. He does note that his heart rate will jump up to 170s-180 when he walks on the treadmill. He does have occasional palpitations. He admits to snoring and daytime somnolence and has a sleep study pending. He denies significant alcohol use. Patient is s/p unsuccessful DCCV on 10/15/19. Echo showed preserved EF with severely dilated LA. Patient is s/p DCCV on 11/29/19 on amiodarone.    On follow up today, patient reports he has done very well since the last visit. He denies any heart racing or palpitations. He denies any bleeding issues on anticoagulation. He has done well with lifestyle changes and has lost weight.   Today, he denies symptoms of palpitations, chest pain, orthopnea, PND, lower extremity edema, dizziness, presyncope, dizziness, syncope, bleeding, or neurologic sequela. The patient is tolerating medications without difficulties and is otherwise without complaint today.    Atrial Fibrillation Risk Factors:  he does have symptoms or diagnosis of sleep apnea. He is not on CPAP therapy. he does not have a history of rheumatic fever. he does not have a history of alcohol  use. The patient does not have a history of early familial atrial fibrillation or other arrhythmias.  he has a BMI of Body mass index is 32.66 kg/m.Marland Kitchen Filed Weights   01/15/21 0850  Weight: 103.2 kg    Family History  Problem Relation Age of Onset  . Varicose Veins Mother   . Stroke Father      Atrial Fibrillation Management history:  Previous antiarrhythmic drugs: amiodarone Previous cardioversions: 10/15/19, 11/29/19 Previous ablations: none CHADS2VASC score: 2 Anticoagulation history: Eliquis   Past Medical History:  Diagnosis Date  . Abnormal findings on diagnostic imaging of lung 09/23/2019   09/22/2019-lung cancer screening-centrilobular and paraseptal emphysema, calcified granulomata noted bilaterally, no suspicious nodules, lung RADS 1, repeat in 12 months   . Atrial fibrillation ([place]) 09/23/2019  . Centrilobular emphysema ([place]) 09/23/2019   09/22/2019-lung cancer screening-centrilobular and paraseptal emphysema, calcified granulomata noted bilaterally, no suspicious nodules, lung RADS 1, repeat in 12 months  09/23/2019-FVC 3.74 (81% predicted), postbronchodilator ratio 80, postbronchodilator FEV1 2.88 (86% predicted), no bronchodilator response, DLCO 19.44 (73% predicted)  . DDD (degenerative disc disease), cervical 05/13/2008   Cervical Disc Degeneration  . History of adenomatous polyp of colon 03/03/2018   05/14/17: Colonoscopy: nonadvanced adenoma, f/u 5 yrs, use SuPrep, Murphy/GAP  Last Assessment & Plan:  Relevant Hx: Course: Daily Update: Today's Plan:  Marland Kitchen Hyperlipidemia   . Kidney stone   . Nephrolithiasis 05/22/2011  . Nontraumatic incomplete tear of right rotator cuff 10/14/2018  . Other and unspecified hyperlipidemia 11/20/2009   Hyperlipidemia  . RBBB 06/09/2012  . Secondary hypercoagulable state ([place]) 09/28/2019  . Shortness of  breath 09/23/2019   Past Surgical History:  Procedure Laterality Date  . CARDIOVERSION N/A 10/15/2019   Procedure: CARDIOVERSION;   Surgeon: Jerline Pain, MD;  Location: [facility] ENDOSCOPY;  Service: Cardiovascular;  Laterality: N/A;  . CARDIOVERSION N/A 11/29/2019   Procedure: CARDIOVERSION;  Surgeon: Dorothy Spark, MD;  Location: [facility] ENDOSCOPY;  Service: Cardiovascular;  Laterality: N/A;  . HEMORRHOID SURGERY      Current Outpatient Medications  Medication Sig Dispense Refill  . acetaminophen (TYLENOL) 500 MG tablet Take 1,000 mg by mouth every 6 (six) hours as needed (for pain.).    Marland Kitchen amiodarone (PACERONE) 200 MG tablet Take 0.5 tablets (100 mg total) by mouth daily.    Marland Kitchen apixaban (ELIQUIS) 5 MG TABS tablet Take 1 tablet (5 mg total) by mouth 2 (two) times daily. 180 tablet 2  . atorvastatin (LIPITOR) 40 MG tablet Take 1 tablet (40 mg total) by mouth daily. 90 tablet 3  . Cholecalciferol 25 MCG (1000 UT) tablet Take by mouth.    . clobetasol (TEMOVATE) 0.05 % external solution Apply 1 application topically. Uses Monday and Thursday    . FLUoxetine (PROZAC) 20 MG capsule Take 20 mg by mouth daily.     Marland Kitchen FLUZONE HIGH-DOSE QUADRIVALENT 0.7 ML SUSY     . gabapentin (NEURONTIN) 100 MG capsule Take 100 mg by mouth daily.    Marland Kitchen ketoconazole (NIZORAL) 2 % shampoo Apply 1 application topically 2 (two) times a week.     . lamoTRIgine (LAMICTAL) 25 MG tablet TAKE ONE TABLET BY MOUTH DAILY FOR 14 DAYS, THEN TAKE TWO TABLETS DAILY FOR MOOD    . methocarbamol (ROBAXIN) 500 MG tablet Take 1 tablet by mouth 3 (three) times daily as needed.    . tamsulosin (FLOMAX) 0.4 MG CAPS capsule Take 0.4 mg by mouth daily.     No current facility-administered medications for this encounter.    Allergies  Allergen Reactions  . Venlafaxine Palpitations  . Bupropion Other (See Comments)    unknown  . Zoloft [Sertraline Hcl] Other (See Comments)    Tick    Social History   Socioeconomic History  . Marital status: Married    Spouse name: Not on file  . Number of children: Not on file  . Years of education: Not on file  . Highest  education level: Not on file  Occupational History  . Not on file  Tobacco Use  . Smoking status: Former Smoker    Packs/day: 2.00    Years: 46.00    Pack years: 92.00    Quit date: 2010    Years since quitting: 12.2  . Smokeless tobacco: Never Used  Vaping Use  . Vaping Use: Never used  Substance and Sexual Activity  . Alcohol use: Yes    Comment: 2-6 drinks a week  . Drug use: No  . Sexual activity: Not on file  Other Topics Concern  . Not on file  Social History Narrative  . Not on file   Social Determinants of Health   Financial Resource Strain: Not on file  Food Insecurity: Not on file  Transportation Needs: Not on file  Physical Activity: Not on file  Stress: Not on file  Social Connections: Not on file  Intimate Partner Violence: Not on file     ROS- All systems are reviewed and negative except as per the HPI above.  Physical Exam: Vitals:   01/15/21 0850  BP: 118/64  Pulse: (!) 53  Weight: 103.2 kg  Height:  5\' 10"  (1.778 m)    GEN- The patient is a well appearing obese male, alert and oriented x 3 today.   HEENT-head normocephalic, atraumatic, sclera clear, conjunctiva pink, hearing intact, trachea midline. Lungs- Clear to ausculation bilaterally, normal work of breathing Heart- Regular rate and rhythm, bradycardia, no murmurs, rubs or gallops  GI- soft, NT, ND, + BS Extremities- no clubbing, cyanosis, or edema MS- no significant deformity or atrophy Skin- no rash or lesion Psych- euthymic mood, full affect Neuro- strength and sensation are intact   Wt Readings from Last 3 Encounters:  01/15/21 103.2 kg  10/03/20 104.8 kg  09/07/20 107 kg    EKG today demonstrates  SB, RBBB Vent. rate 53 BPM PR interval 150 ms QRS duration 164 ms QT/QTc 510/478 ms  Echo 10/05/19  1. Left ventricular ejection fraction, by visual estimation, is 65 to 70%. The left ventricle has hyperdynamic function. There is no left ventricular hypertrophy.  2. Left  ventricular diastolic function could not be evaluated.  3. Global right ventricle has moderately reduced systolic function.The right ventricular size is normal. No increase in right ventricular wall thickness.  4. Left atrial size was severely dilated.  5. Right atrial size was moderately dilated.  6. The mitral valve is normal in structure. Mild mitral valve regurgitation.  7. The tricuspid valve is normal in structure. Tricuspid valve regurgitation is mild.  8. The aortic valve is normal in structure. Aortic valve regurgitation is not visualized. No evidence of aortic valve sclerosis or stenosis.  9. The pulmonic valve was normal in structure. Pulmonic valve regurgitation is not visualized. 10. Aortic dilatation noted. 11. 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 12. Normal pulmonary artery systolic pressure. 13. The atrial septum is grossly normal.  Epic records are reviewed at length today  Assessment and Plan:  1. Persistent atrial fibrillation Patient appears to be maintaining SR. Continue amiodarone 100 mg daily.  Check cmet/TSH today Continue Eliquis 5 mg BID  This patients CHA2DS2-VASc Score and unadjusted Ischemic Stroke Rate (% per year) is equal to 2.2 % stroke rate/year from a score of 2  Above score calculated as 1 point each if present [CHF, HTN, DM, Vascular=MI/PAD/Aortic Plaque, Age if 65-74, or Male] Above score calculated as 2 points each if present [Age > 75, or Stroke/TIA/TE]  2. Obesity Body mass index is 32.66 kg/m. Lifestyle modification was discussed and encouraged including regular physical activity and weight reduction. Patient has done well with this.  3. OSA Dx with moderate OSA. He is not on CPAP.  4. CAD Coronary artery calcification and aortic atherosclerosis seen on chest CT. Myoview 12/20/19 normal. No anginal symptoms.  5. Bradycardia Longstanding and asymptomatic No changes today.   Follow up with Dr Harriet Masson  as scheduled. AF clinic in one year.    [facility] [address] [place], [place] [address] [phone] 01/15/2021 9:11 AM

## 2021-01-16 ENCOUNTER — Other Ambulatory Visit (HOSPITAL_COMMUNITY): Payer: Self-pay | Admitting: Physician Assistant

## 2021-01-22 ENCOUNTER — Ambulatory Visit: Payer: Medicare HMO

## 2021-01-23 NOTE — Progress Notes (Signed)
PATIENT: Joshua [name] DOB: 1946/05/25  REASON FOR VISIT: follow up HISTORY FROM: patient  HISTORY OF PRESENT ILLNESS: Today 01/24/21  HISTORY Joshua [name] is a 75 year old male, seen in request by his primary care physician Dr. [name] for evaluation of tremor, initial evaluation was on January 21, 2020.  I have reviewed and summarized the referring note from the referring physician and multiple hospital chart, emergency presentation on September 22, 2020, he presented for shortness of breath, CT chest without evidence of PE, was found to be atrial fibrillation, was started on anticoagulation,  He had DC cardioversion by Dr. Meda Coffee on November 29, 2019, that was successful.  He still anticoagulation Eliquis 5 mg twice a day, amiodarone, he also has history of hyperlipidemia, anger issues, is under the care of psychiatrist at the [facility], was started on Depakote 750 mg at bedtime in 2020 along with Prozac 20 mg, hydroxyzine 10 mg, few months later, around November 2020, he began to notice bilateral hands tremor, involving both hands, right hand seems to be more obvious than the left hand, he is right-handed, now he eats with his left hand, sometimes the shaking was so bad, half of his bones was spilled out, especially when holding on small granulated food, such as rice, peas.  He denies gait abnormality, denied loss sense of smell, no constipation, orthostatic dizziness, he does have vivid dreams, wife reported excessive movement during sleep, his wife also send a note concerning about his gradual increased alcohol intake, patient reported that he can take up to 6 to 8 ounce of hard liquor in certain days, but can go on days without alcohol consumption without craving, he also drinks about 10 ounces of caffeine, he denies family history of tremor  He was started on primidone since January 03, 2019, titrating dose now to 50 mg 5 tablets every night, he noticed mild  improvement, does complains of drowsiness.  Laboratory evaluations since 2020, normal TSH, CMP abnormal kidney function, creatinine of 1.39,, CBC showed decreased WBC, with elevated MCV, elevated free T4 1.24, lipid profile showed LDL 73,  Update July 26, 2020 SS: MRI of the brain in April 2021 showed mild age-related changes, no acute abnormalities.  Vitamin B12, folate, TSH were unremarkable, Depakote level was 76 at last visit.   Tremor came on back in 2020 with initiation of Depakote, at one time was on Depakote 750 mg daily, has been working with his psychiatrist, has reduced the dose down to 250 mg daily, with dose reduction, tremor has improved 25 to 50%, but some of the mood issues have returned.  We will be discontinuing Depakote completely in the near future, switching to a new medication (isn't sure the name). Having ketamine infusion at [facility] soon to help with mood, paying out-of-pocket for this.  Tremor is in both hands, mostly in the left, affects eating.  Update January 24, 2021 SS: Off the Depakote, just started Lamictal from psychiatry for his mood just went to 50 mg. Told likely diagnosis of bipolar disorder may be coming. Tremor in both hands intermittent, if notices: when eating, could be sitting watching tv, can be both hands or just one. Any tremor is episodic, doesn't drop things. Few episodes of sharp pain to right parietal area, no more few seconds, maybe total of 3 times. No significant history of headaches or migraines. Tremor improved 75% since off Depakote. He got the ketamine infusions at [facility], 6 treatments, improved the way he  feels about things. Goes to AFIB clinic, remains on Eliquis. TSH was 2.335. [facility], occasional tightness right neck, spasm last for 1 minute, given muscle relaxer. Has kidney stone, had to have stent placed, lithotripsy.   REVIEW OF SYSTEMS: Out of a complete 14 system review of symptoms, the patient complains only of the following symptoms, and  all other reviewed systems are negative.  Tremor  ALLERGIES: Allergies  Allergen Reactions  . Venlafaxine Palpitations  . Bupropion Other (See Comments)    unknown  . Zoloft [Sertraline Hcl] Other (See Comments)    Tick    HOME MEDICATIONS: Outpatient Medications Prior to Visit  Medication Sig Dispense Refill  . acetaminophen (TYLENOL) 500 MG tablet Take 1,000 mg by mouth every 6 (six) hours as needed (for pain.).    Marland Kitchen amiodarone (PACERONE) 200 MG tablet Take 0.5 tablets (100 mg total) by mouth daily.    Marland Kitchen atorvastatin (LIPITOR) 40 MG tablet Take 1 tablet (40 mg total) by mouth daily. 90 tablet 3  . Cholecalciferol 25 MCG (1000 UT) tablet Take by mouth.    . clobetasol (TEMOVATE) 0.05 % external solution Apply 1 application topically. Uses Monday and Thursday    . ELIQUIS 5 MG TABS tablet TAKE ONE TABLET BY MOUTH TWICE A DAY 60 tablet 11  . FLUoxetine (PROZAC) 20 MG capsule Take 20 mg by mouth daily.     Marland Kitchen FLUZONE HIGH-DOSE QUADRIVALENT 0.7 ML SUSY     . ketoconazole (NIZORAL) 2 % shampoo Apply 1 application topically 2 (two) times a week.     . lamoTRIgine (LAMICTAL) 25 MG tablet TAKE ONE TABLET BY MOUTH DAILY FOR 14 DAYS, THEN TAKE TWO TABLETS DAILY FOR MOOD    . methocarbamol (ROBAXIN) 500 MG tablet Take 1 tablet by mouth 3 (three) times daily as needed.    . tamsulosin (FLOMAX) 0.4 MG CAPS capsule Take 0.4 mg by mouth daily.    Marland Kitchen gabapentin (NEURONTIN) 100 MG capsule Take 100 mg by mouth daily.     No facility-administered medications prior to visit.    PAST MEDICAL HISTORY: Past Medical History:  Diagnosis Date  . Abnormal findings on diagnostic imaging of lung 09/23/2019   09/22/2019-lung cancer screening-centrilobular and paraseptal emphysema, calcified granulomata noted bilaterally, no suspicious nodules, lung RADS 1, repeat in 12 months   . Atrial fibrillation ([place]) 09/23/2019  . Centrilobular emphysema ([place]) 09/23/2019   09/22/2019-lung cancer  screening-centrilobular and paraseptal emphysema, calcified granulomata noted bilaterally, no suspicious nodules, lung RADS 1, repeat in 12 months  09/23/2019-FVC 3.74 (81% predicted), postbronchodilator ratio 80, postbronchodilator FEV1 2.88 (86% predicted), no bronchodilator response, DLCO 19.44 (73% predicted)  . DDD (degenerative disc disease), cervical 05/13/2008   Cervical Disc Degeneration  . History of adenomatous polyp of colon 03/03/2018   05/14/17: Colonoscopy: nonadvanced adenoma, f/u 5 yrs, use SuPrep, Murphy/GAP  Last Assessment & Plan:  Relevant Hx: Course: Daily Update: Today's Plan:  Marland Kitchen Hyperlipidemia   . Kidney stone   . Nephrolithiasis 05/22/2011  . Nontraumatic incomplete tear of right rotator cuff 10/14/2018  . Other and unspecified hyperlipidemia 11/20/2009   Hyperlipidemia  . RBBB 06/09/2012  . Secondary hypercoagulable state ([place]) 09/28/2019  . Shortness of breath 09/23/2019    PAST SURGICAL HISTORY: Past Surgical History:  Procedure Laterality Date  . CARDIOVERSION N/A 10/15/2019   Procedure: CARDIOVERSION;  Surgeon: Jerline Pain, MD;  Location: [facility] ENDOSCOPY;  Service: Cardiovascular;  Laterality: N/A;  . CARDIOVERSION N/A 11/29/2019   Procedure: CARDIOVERSION;  Surgeon:  Dorothy Spark, MD;  Location: [facility] ENDOSCOPY;  Service: Cardiovascular;  Laterality: N/A;  . HEMORRHOID SURGERY      FAMILY HISTORY: Family History  Problem Relation Age of Onset  . Varicose Veins Mother   . Stroke Father     SOCIAL HISTORY: Social History   Socioeconomic History  . Marital status: Married    Spouse name: Not on file  . Number of children: Not on file  . Years of education: Not on file  . Highest education level: Not on file  Occupational History  . Not on file  Tobacco Use  . Smoking status: Former Smoker    Packs/day: 2.00    Years: 46.00    Pack years: 92.00    Quit date: 2010    Years since quitting: 12.2  . Smokeless tobacco: Never Used  Vaping Use  .  Vaping Use: Never used  Substance and Sexual Activity  . Alcohol use: Yes    Comment: 2-6 drinks a week  . Drug use: No  . Sexual activity: Not on file  Other Topics Concern  . Not on file  Social History Narrative  . Not on file   Social Determinants of Health   Financial Resource Strain: Not on file  Food Insecurity: Not on file  Transportation Needs: Not on file  Physical Activity: Not on file  Stress: Not on file  Social Connections: Not on file  Intimate Partner Violence: Not on file   PHYSICAL EXAM  Vitals:   01/24/21 0905  BP: 124/69  Pulse: (!) 46  Weight: 226 lb (102.5 kg)  Height: 5\' 11"  (1.803 m)   Body mass index is 31.52 kg/m.  Generalized: Well developed, in no acute distress   Neurological examination  Mentation: Alert oriented to time, place, history taking. Follows all commands speech and language fluent Cranial nerve II-XII: Pupils were equal round reactive to light. Extraocular movements were full, visual field were full on confrontational test. Facial sensation and strength were normal.  Head turning and shoulder shrug  were normal and symmetric. Motor: The motor testing reveals 5 over 5 strength of all 4 extremities. Good symmetric motor tone is noted throughout.  Mild bilateral hand postural tremor, greater on the left than right.  No rigidity or bradykinesia. Sensory: Sensory testing is intact to soft touch on all 4 extremities. No evidence of extinction is noted.  Coordination: Cerebellar testing reveals good finger-nose-finger and heel-to-shin bilaterally. Mild tremor with finger nose finger on the left.  Tremor is mildly translated to handwriting, spiral draw essentially normal Gait and station: Gait is normal, normal steps, arm swing Reflexes: Deep tendon reflexes are symmetric and normal bilaterally.   DIAGNOSTIC DATA (LABS, IMAGING, TESTING) - I reviewed patient records, labs, notes, testing and imaging myself where available.  Lab Results   Component Value Date   WBC 3.5 (L) 11/22/2019   HGB 14.4 11/22/2019   HCT 43.8 11/22/2019   MCV 100.5 (H) 11/22/2019   PLT 175 11/22/2019      Component Value Date/Time   NA 138 01/15/2021 0852   NA 144 10/26/2020 0949   K 4.3 01/15/2021 0852   CL 105 01/15/2021 0852   CO2 26 01/15/2021 0852   GLUCOSE 130 (H) 01/15/2021 0852   BUN 19 01/15/2021 0852   BUN 20 10/26/2020 0949   CREATININE 1.32 (H) 01/15/2021 0852   CALCIUM 9.5 01/15/2021 0852   PROT 7.0 01/15/2021 0852   ALBUMIN 3.8 01/15/2021 0852   AST 44 (  H) 01/15/2021 0852   ALT 41 01/15/2021 0852   ALKPHOS 74 01/15/2021 0852   BILITOT 1.5 (H) 01/15/2021 0852   GFRNONAA 57 (L) 01/15/2021 0852   GFRAA 59 (L) 10/26/2020 0949   Lab Results  Component Value Date   CHOL 139 12/10/2019   HDL 47 12/10/2019   LDLCALC 73 12/10/2019   TRIG 100 12/10/2019   CHOLHDL 3.0 12/10/2019   No results found for: HGBA1C Lab Results  Component Value Date   LTRVUYEB34 356 01/21/2020   Lab Results  Component Value Date   TSH 2.335 01/15/2021   ASSESSMENT AND PLAN 75 y.o. year old male  has a past medical history of Abnormal findings on diagnostic imaging of lung (09/23/2019), Atrial fibrillation ([place]) (09/23/2019), Centrilobular emphysema ([place]) (09/23/2019), DDD (degenerative disc disease), cervical (05/13/2008), History of adenomatous polyp of colon (03/03/2018), Hyperlipidemia, Kidney stone, Nephrolithiasis (05/22/2011), Nontraumatic incomplete tear of right rotator cuff (10/14/2018), Other and unspecified hyperlipidemia (11/20/2009), RBBB (06/09/2012), Secondary hypercoagulable state ([place]) (09/28/2019), and Shortness of breath (09/23/2019). here with:  1.  Bilateral hand tremor -MRI of the brain showed mild age-related changes, no acute abnormalities -Laboratory evaluation, B12, folate, TSH were unremarkable, Depakote level was 76 -Now off Depakote, on Lamictal 50 mg daily for mood disorder -Tremor came on with initiation of Depakote, is  75% improved with Depakote discontinuation -Only very mild tremor on the left with finger-nose-finger, no bradykinesia, or rigidity, gait was normal -Will follow-up in 8 months with Dr. Krista Blue, continue follow-up with PCP, psychiatry  I spent 30 minutes of face-to-face and non-face-to-face time with patient.  This included previsit chart review, lab review, study review, order entry, electronic health record documentation, patient education.  Butler Denmark, AGNP-C, DNP 01/24/2021, 9:52 AM [facility] [address], [place], [place] [address] [phone]

## 2021-01-24 ENCOUNTER — Encounter: Payer: Self-pay | Admitting: Neurology

## 2021-01-24 ENCOUNTER — Ambulatory Visit: Payer: Medicare HMO | Admitting: Neurology

## 2021-01-24 VITALS — BP 124/69 | HR 46 | Ht 71.0 in | Wt 226.0 lb

## 2021-01-24 DIAGNOSIS — G250 Essential tremor: Secondary | ICD-10-CM | POA: Diagnosis not present

## 2021-01-24 NOTE — Patient Instructions (Addendum)
Continue to see your psychiatrist, and primary care doctor  See you back in 8 months

## 2021-02-07 DIAGNOSIS — K5900 Constipation, unspecified: Secondary | ICD-10-CM | POA: Diagnosis not present

## 2021-02-07 DIAGNOSIS — N200 Calculus of kidney: Secondary | ICD-10-CM | POA: Diagnosis not present

## 2021-02-21 ENCOUNTER — Other Ambulatory Visit (HOSPITAL_COMMUNITY): Payer: Self-pay | Admitting: Physician Assistant

## 2021-03-08 ENCOUNTER — Encounter: Payer: Self-pay | Admitting: Family Medicine

## 2021-03-08 ENCOUNTER — Ambulatory Visit (INDEPENDENT_AMBULATORY_CARE_PROVIDER_SITE_OTHER): Payer: Medicare HMO | Admitting: Family Medicine

## 2021-03-08 ENCOUNTER — Other Ambulatory Visit: Payer: Self-pay

## 2021-03-08 VITALS — BP 138/80 | HR 50 | Temp 97.6°F | Ht 71.0 in | Wt 225.3 lb

## 2021-03-08 DIAGNOSIS — I712 Thoracic aortic aneurysm, without rupture, unspecified: Secondary | ICD-10-CM | POA: Insufficient documentation

## 2021-03-08 DIAGNOSIS — J432 Centrilobular emphysema: Secondary | ICD-10-CM | POA: Diagnosis not present

## 2021-03-08 DIAGNOSIS — R3911 Hesitancy of micturition: Secondary | ICD-10-CM | POA: Insufficient documentation

## 2021-03-08 DIAGNOSIS — I700 Atherosclerosis of aorta: Secondary | ICD-10-CM

## 2021-03-08 DIAGNOSIS — D6869 Other thrombophilia: Secondary | ICD-10-CM | POA: Diagnosis not present

## 2021-03-08 DIAGNOSIS — I10 Essential (primary) hypertension: Secondary | ICD-10-CM | POA: Diagnosis not present

## 2021-03-08 DIAGNOSIS — E782 Mixed hyperlipidemia: Secondary | ICD-10-CM

## 2021-03-08 DIAGNOSIS — F3341 Major depressive disorder, recurrent, in partial remission: Secondary | ICD-10-CM

## 2021-03-08 DIAGNOSIS — D692 Other nonthrombocytopenic purpura: Secondary | ICD-10-CM

## 2021-03-08 DIAGNOSIS — N200 Calculus of kidney: Secondary | ICD-10-CM | POA: Diagnosis not present

## 2021-03-08 DIAGNOSIS — I480 Paroxysmal atrial fibrillation: Secondary | ICD-10-CM | POA: Diagnosis not present

## 2021-03-08 LAB — COMPLETE METABOLIC PANEL WITH GFR
AG Ratio: 1.7 (calc) (ref 1.0–2.5)
ALT: 33 U/L (ref 9–46)
AST: 36 U/L — ABNORMAL HIGH (ref 10–35)
Albumin: 4.5 g/dL (ref 3.6–5.1)
Alkaline phosphatase (APISO): 92 U/L (ref 35–144)
BUN/Creatinine Ratio: 16 (calc) (ref 6–22)
BUN: 20 mg/dL (ref 7–25)
CO2: 27 mmol/L (ref 20–32)
Calcium: 9.2 mg/dL (ref 8.6–10.3)
Chloride: 108 mmol/L (ref 98–110)
Creat: 1.23 mg/dL — ABNORMAL HIGH (ref 0.70–1.18)
GFR, Est African American: 68 mL/min/{1.73_m2} (ref >=60)
GFR, Est Non African American: 58 mL/min/{1.73_m2} — ABNORMAL LOW (ref >=60)
Globulin: 2.6 g/dL (calc) (ref 1.9–3.7)
Glucose, Bld: 82 mg/dL (ref 65–139)
Potassium: 4.2 mmol/L (ref 3.5–5.3)
Sodium: 144 mmol/L (ref 135–146)
Total Bilirubin: 0.6 mg/dL (ref 0.2–1.2)
Total Protein: 7.1 g/dL (ref 6.1–8.1)

## 2021-03-08 LAB — LIPID PANEL W/REFLEX DIRECT LDL
Cholesterol: 164 mg/dL (ref <200)
HDL: 71 mg/dL (ref >=40)
LDL Cholesterol (Calc): 70 mg/dL (calc)
Non-HDL Cholesterol (Calc): 93 mg/dL (calc) (ref <130)
Total CHOL/HDL Ratio: 2.3 (calc) (ref <5.0)
Triglycerides: 142 mg/dL (ref <150)

## 2021-03-08 NOTE — Assessment & Plan Note (Signed)
Rhythm controlled with amiodarone.   Anticoagulated with eliquis.  He will continue management with cardiology.

## 2021-03-08 NOTE — Progress Notes (Signed)
Joshua [name] - 75 y.o. male MRN [id number]  Date of birth: [birth year]  Subjective No chief complaint on file.   HPI Joshua [name] is a 75 y.o. male here today for follow up visit.  History of a. Fib, depression, HLD, and mild emphysema.    A. Fib is managed by cardiology.  Currently on amiodarone and eliquis for anticoagulation.  This remains well controlled and he denies symptoms at this time.   He is seeing psychiatry at the [facility] for treatment of depression.  Currently taking lamictal and fluoxetine.  Tolerating well.  Symptoms are well controlled at this time.    Recently had kidney stone s/p lithotripsy followed by left ureteroscopic stone extraction.  He continues on flomax and is followed by urology.  Denies pain or other symptoms at this time.    ROS:  A comprehensive ROS was completed and negative except as noted per HPI    Allergies  Allergen Reactions  . Venlafaxine Palpitations  . Bupropion Other (See Comments)    unknown  . Zoloft [Sertraline Hcl] Other (See Comments)    Tick    Past Medical History:  Diagnosis Date  . Abnormal findings on diagnostic imaging of lung 09/23/2019   09/22/2019-lung cancer screening-centrilobular and paraseptal emphysema, calcified granulomata noted bilaterally, no suspicious nodules, lung RADS 1, repeat in 12 months   . Atrial fibrillation ([place]) 09/23/2019  . Centrilobular emphysema ([place]) 09/23/2019   09/22/2019-lung cancer screening-centrilobular and paraseptal emphysema, calcified granulomata noted bilaterally, no suspicious nodules, lung RADS 1, repeat in 12 months  09/23/2019-FVC 3.74 (81% predicted), postbronchodilator ratio 80, postbronchodilator FEV1 2.88 (86% predicted), no bronchodilator response, DLCO 19.44 (73% predicted)  . DDD (degenerative disc disease), cervical 05/13/2008   Cervical Disc Degeneration  . History of adenomatous polyp of colon 03/03/2018   05/14/17: Colonoscopy: nonadvanced adenoma, f/u 5 yrs, use  SuPrep, Murphy/GAP  Last Assessment & Plan:  Relevant Hx: Course: Daily Update: Today's Plan:  Marland Kitchen Hyperlipidemia   . Kidney stone   . Nephrolithiasis 05/22/2011  . Nontraumatic incomplete tear of right rotator cuff 10/14/2018  . Other and unspecified hyperlipidemia 11/20/2009   Hyperlipidemia  . RBBB 06/09/2012  . Secondary hypercoagulable state ([place]) 09/28/2019  . Shortness of breath 09/23/2019    Past Surgical History:  Procedure Laterality Date  . CARDIOVERSION N/A 10/15/2019   Procedure: CARDIOVERSION;  Surgeon: Jerline Pain, MD;  Location: [facility] ENDOSCOPY;  Service: Cardiovascular;  Laterality: N/A;  . CARDIOVERSION N/A 11/29/2019   Procedure: CARDIOVERSION;  Surgeon: Dorothy Spark, MD;  Location: [facility] ENDOSCOPY;  Service: Cardiovascular;  Laterality: N/A;  . HEMORRHOID SURGERY      Social History   Socioeconomic History  . Marital status: Married    Spouse name: Not on file  . Number of children: Not on file  . Years of education: Not on file  . Highest education level: Not on file  Occupational History  . Not on file  Tobacco Use  . Smoking status: Former Smoker    Packs/day: 2.00    Years: 46.00    Pack years: 92.00    Quit date: 2010    Years since quitting: 12.3  . Smokeless tobacco: Never Used  Vaping Use  . Vaping Use: Never used  Substance and Sexual Activity  . Alcohol use: Yes    Comment: 2-6 drinks a week  . Drug use: No  . Sexual activity: Not on file  Other Topics Concern  . Not on file  Social History Narrative  . Not on file   Social Determinants of Health   Financial Resource Strain: Not on file  Food Insecurity: Not on file  Transportation Needs: Not on file  Physical Activity: Not on file  Stress: Not on file  Social Connections: Not on file    Family History  Problem Relation Age of Onset  . Varicose Veins Mother   . Stroke Father     Health Maintenance  Topic Date Due  . Hepatitis C Screening  Never done  . INFLUENZA VACCINE   06/04/2021  . TETANUS/TDAP  01/31/2026  . COLONOSCOPY (Pts 45-35yrs Insurance coverage will need to be confirmed)  05/15/2027  . COVID-19 Vaccine  Completed  . PNA vac Low Risk Adult  Completed  . HPV VACCINES  Aged Out     ----------------------------------------------------------------------------------------------------------------------------------------------------------------------------------------------------------------- Physical Exam There were no vitals taken for this visit.  Physical Exam Constitutional:      Appearance: Normal appearance.  HENT:     Head: Normocephalic and atraumatic.  Cardiovascular:     Rate and Rhythm: Normal rate and regular rhythm.  Pulmonary:     Effort: Pulmonary effort is normal.     Breath sounds: Normal breath sounds.  Musculoskeletal:     Cervical back: Neck supple.  Neurological:     General: No focal deficit present.     Mental Status: He is alert.  Psychiatric:        Mood and Affect: Mood normal.        Behavior: Behavior normal.     ------------------------------------------------------------------------------------------------------------------------------------------------------------------------------------------------------------------- Assessment and Plan  Essential hypertension Blood pressure is at goal at for age and co-morbidities.  I recommend continued home monitoring.  In addition they were instructed to follow a low sodium diet with regular exercise to help to maintain adequate control of blood pressure.    Senile purpura ([place]) Reassured.  Eliquis likely contributing some as well.   Atrial fibrillation (HCC) Rhythm controlled with amiodarone.   Anticoagulated with eliquis.  He will continue management with cardiology.    Aortic atherosclerosis (HCC) Continue statin and maintain good blood pressure control.   Centrilobular emphysema ([place]) Noted on previous low dose CT.   Denies significant symptoms at this  time.   Nephrolithiasis Managed by urology.  Recent KUB without new stones.   Secondary hypercoagulable state ([place]) Taking eliquis for stroke prevention related to a. Fib.   Mixed hyperlipidemia Update lipid panel. Tolerating atorvastatin well, continue.   Major depressive disorder Managed by psychiatry through the [place].   Stable at this time.    No orders of the defined types were placed in this encounter.   No follow-ups on file.    This visit occurred during the SARS-CoV-2 public health emergency.  Safety protocols were in place, including screening questions prior to the visit, additional usage of staff PPE, and extensive cleaning of exam room while observing appropriate contact time as indicated for disinfecting solutions.

## 2021-03-08 NOTE — Assessment & Plan Note (Signed)
Noted on previous low dose CT.   Denies significant symptoms at this time.

## 2021-03-08 NOTE — Assessment & Plan Note (Signed)
Update lipid panel. Tolerating atorvastatin well, continue.

## 2021-03-08 NOTE — Assessment & Plan Note (Signed)
Blood pressure is at goal at for age and co-morbidities.  I recommend continued home monitoring.  In addition they were instructed to follow a low sodium diet with regular exercise to help to maintain adequate control of blood pressure.

## 2021-03-08 NOTE — Assessment & Plan Note (Signed)
Continue statin and maintain good blood pressure control.

## 2021-03-08 NOTE — Assessment & Plan Note (Signed)
Taking eliquis for stroke prevention related to a. Fib.

## 2021-03-08 NOTE — Assessment & Plan Note (Signed)
Managed by psychiatry through the [place].   Stable at this time.

## 2021-03-08 NOTE — Assessment & Plan Note (Signed)
Reassured.  Eliquis likely contributing some as well.

## 2021-03-08 NOTE — Assessment & Plan Note (Signed)
Managed by urology.  Recent KUB without new stones.

## 2021-03-08 NOTE — Patient Instructions (Signed)
Great to see you today! Have labs completed See me again in 6 months.  

## 2021-03-26 DIAGNOSIS — E785 Hyperlipidemia, unspecified: Secondary | ICD-10-CM | POA: Insufficient documentation

## 2021-03-27 ENCOUNTER — Other Ambulatory Visit: Payer: Self-pay

## 2021-03-27 ENCOUNTER — Encounter: Payer: Self-pay | Admitting: Cardiology

## 2021-03-27 ENCOUNTER — Ambulatory Visit: Payer: Medicare HMO | Admitting: Cardiology

## 2021-03-27 VITALS — BP 122/80 | HR 52 | Ht 71.0 in | Wt 226.0 lb

## 2021-03-27 DIAGNOSIS — I480 Paroxysmal atrial fibrillation: Secondary | ICD-10-CM

## 2021-03-27 DIAGNOSIS — E669 Obesity, unspecified: Secondary | ICD-10-CM | POA: Diagnosis not present

## 2021-03-27 DIAGNOSIS — E782 Mixed hyperlipidemia: Secondary | ICD-10-CM

## 2021-03-27 DIAGNOSIS — I2510 Atherosclerotic heart disease of native coronary artery without angina pectoris: Secondary | ICD-10-CM | POA: Diagnosis not present

## 2021-03-27 DIAGNOSIS — I712 Thoracic aortic aneurysm, without rupture, unspecified: Secondary | ICD-10-CM

## 2021-03-27 DIAGNOSIS — I10 Essential (primary) hypertension: Secondary | ICD-10-CM | POA: Diagnosis not present

## 2021-03-27 NOTE — Patient Instructions (Signed)

## 2021-03-27 NOTE — Progress Notes (Signed)
Cardiology Office Note:    Date:  03/27/2021   ID:  Joshua [name], DOB Nov 14, 1945, MRN [id number]  PCP:  [name], DO  Cardiologist:  Berniece Salines, DO  Electrophysiologist:  None   Referring MD: [name], DO   Chief Complaint  Patient presents with  . Follow-up    History of Present Illness:    Joshua [name] is a 75 y.o. male with a hx of atrial fibrillation status post DC cardioversion on November 29, 2019 and October 15, 2019 now on amiodarone and Eliquis, mild coronary artery disease, recently aorta dilatation. The patient is here today for follow-up visit.  I saw the patient in November 2021 at that time we talked about his stress test which was normal but he had symptoms I therefore proceeded the patient to get a coronary CTA.  He did get his coronary CTA which showed evidence of mild coronary artery disease with proximal ascending aorta dilatation.  Here for no complaints at this time.  Past Medical History:  Diagnosis Date  . Abnormal findings on diagnostic imaging of lung 09/23/2019   09/22/2019-lung cancer screening-centrilobular and paraseptal emphysema, calcified granulomata noted bilaterally, no suspicious nodules, lung RADS 1, repeat in 12 months   . Atrial fibrillation ([place]) 09/23/2019  . Centrilobular emphysema ([place]) 09/23/2019   09/22/2019-lung cancer screening-centrilobular and paraseptal emphysema, calcified granulomata noted bilaterally, no suspicious nodules, lung RADS 1, repeat in 12 months  09/23/2019-FVC 3.74 (81% predicted), postbronchodilator ratio 80, postbronchodilator FEV1 2.88 (86% predicted), no bronchodilator response, DLCO 19.44 (73% predicted)  . DDD (degenerative disc disease), cervical 05/13/2008   Cervical Disc Degeneration  . History of adenomatous polyp of colon 03/03/2018   05/14/17: Colonoscopy: nonadvanced adenoma, f/u 5 yrs, use SuPrep, Murphy/GAP  Last Assessment & Plan:  Relevant Hx: Course: Daily Update: Today's  Plan:  Marland Kitchen Hyperlipidemia   . Kidney stone   . Nephrolithiasis 05/22/2011  . Nontraumatic incomplete tear of right rotator cuff 10/14/2018  . Other and unspecified hyperlipidemia 11/20/2009   Hyperlipidemia  . RBBB 06/09/2012  . Secondary hypercoagulable state ([place]) 09/28/2019  . Shortness of breath 09/23/2019    Past Surgical History:  Procedure Laterality Date  . CARDIOVERSION N/A 10/15/2019   Procedure: CARDIOVERSION;  Surgeon: Jerline Pain, MD;  Location: [facility] ENDOSCOPY;  Service: Cardiovascular;  Laterality: N/A;  . CARDIOVERSION N/A 11/29/2019   Procedure: CARDIOVERSION;  Surgeon: Dorothy Spark, MD;  Location: [facility] ENDOSCOPY;  Service: Cardiovascular;  Laterality: N/A;  . HEMORRHOID SURGERY      Current Medications: Current Meds  Medication Sig  . acetaminophen (TYLENOL) 500 MG tablet Take 1,000 mg by mouth every 6 (six) hours as needed (for pain.).  Marland Kitchen amiodarone (PACERONE) 200 MG tablet Take 0.5 tablets (100 mg total) by mouth daily.  Marland Kitchen apixaban (ELIQUIS) 5 MG TABS tablet Take 5 mg by mouth 2 (two) times daily.  Marland Kitchen atorvastatin (LIPITOR) 40 MG tablet Take 1 tablet (40 mg total) by mouth daily.  . Cholecalciferol 25 MCG (1000 UT) tablet Take 1,000 Units by mouth daily.  . clobetasol (TEMOVATE) 0.05 % external solution Apply 1 application topically. Uses Monday and Thursday  . FLUoxetine (PROZAC) 20 MG capsule Take 20 mg by mouth daily.   Marland Kitchen lamoTRIgine (LAMICTAL ODT) 50 MG TBDP Take 50 mg by mouth daily.  . methocarbamol (ROBAXIN) 500 MG tablet Take 500 mg by mouth as needed for muscle spasms.  . tamsulosin (FLOMAX) 0.4 MG CAPS capsule Take 0.4 mg by mouth 3 (  three) times a week.     Allergies:   Venlafaxine, Bupropion, and Zoloft [sertraline hcl]   Social History   Socioeconomic History  . Marital status: Married    Spouse name: Not on file  . Number of children: Not on file  . Years of education: Not on file  . Highest education level: Not on file  Occupational History   . Not on file  Tobacco Use  . Smoking status: Former Smoker    Packs/day: 2.00    Years: 46.00    Pack years: 92.00    Quit date: 2010    Years since quitting: 12.4  . Smokeless tobacco: Never Used  Vaping Use  . Vaping Use: Never used  Substance and Sexual Activity  . Alcohol use: Yes    Comment: 2-6 drinks a week  . Drug use: No  . Sexual activity: Not on file  Other Topics Concern  . Not on file  Social History Narrative  . Not on file   Social Determinants of Health   Financial Resource Strain: Not on file  Food Insecurity: Not on file  Transportation Needs: Not on file  Physical Activity: Not on file  Stress: Not on file  Social Connections: Not on file     Family History: The patient's family history includes Stroke in his father; Varicose Veins in his mother.  ROS:   Review of Systems  Constitution: Negative for decreased appetite, fever and weight gain.  HENT: Negative for congestion, ear discharge, hoarse voice and sore throat.   Eyes: Negative for discharge, redness, vision loss in right eye and visual halos.  Cardiovascular: Negative for chest pain, dyspnea on exertion, leg swelling, orthopnea and palpitations.  Respiratory: Negative for cough, hemoptysis, shortness of breath and snoring.   Endocrine: Negative for heat intolerance and polyphagia.  Hematologic/Lymphatic: Negative for bleeding problem. Does not bruise/bleed easily.  Skin: Negative for flushing, nail changes, rash and suspicious lesions.  Musculoskeletal: Negative for arthritis, joint pain, muscle cramps, myalgias, neck pain and stiffness.  Gastrointestinal: Negative for abdominal pain, bowel incontinence, diarrhea and excessive appetite.  Genitourinary: Negative for decreased libido, genital sores and incomplete emptying.  Neurological: Negative for brief paralysis, focal weakness, headaches and loss of balance.  Psychiatric/Behavioral: Negative for altered mental status, depression and  suicidal ideas.  Allergic/Immunologic: Negative for HIV exposure and persistent infections.    EKGs/Labs/Other Studies Reviewed:    The following studies were reviewed today:   EKG: None today  Coronary CTA December 2021 FINDINGS: Coronary calcium score: The patient's coronary artery calcium score is 459, which places the patient in the 55 percentile.  Coronary arteries: Normal coronary origins.  Right dominance.  Right Coronary Artery: Normal caliber vessel, gives rise to PDA. There is a small conus branch. There is a mixed calcified and noncalcified plaque at the proximal portion of the mid RCA with 1-24% stenosis.  Left Main Coronary Artery: Normal caliber vessel. Mixed calcified and noncalcified plaque in the distal left main with 1-24% stenosis.  Left Anterior Descending Coronary Artery: Normal caliber vessel. There is mixed calcified and noncalcified plaque in the proximal LAD with at least 25-49% stenosis, and higher degree stenosis cannot be excluded. There is noncalcified plaque throughout the proximal LAD, and then there is a mixed calcified and noncalcified plaque proximal to the origin of D1 with at least 25-49% stenosis. There is scattered calcified and noncalcified plaqque throughout the mid LAD with 1-24% stenosis. Gives rise to 1 diagonal branch.  Left Circumflex  Artery: Normal caliber vessel. There is mixed calcified and noncalcified plaque in the proximal LCx with 25-49% stenosis. Gives rise to 2 OM branches.  Aorta: Mildly enlarged size, 40 mm at the mid ascending aorta (level of the PA bifurcation) measured double oblique. Scattered calcifications, consistent with aortic atherosclerosis. No dissection.  Aortic Valve: Trivial calcifications. Trileaflet.  Other findings:  Normal pulmonary vein drainage into the left atrium.  Normal left atrial appendage without a thrombus.  Normal size of the pulmonary artery.  Likely small PFO with  trivial left to right flow.  IMPRESSION: 1. At least mild nonobstructive CAD, CADRADS = 2. CT FFR will be performed and reported separately. Areas of most concern are in the proximal LAD, with less severe lesions in the proximal LCx and proximal/mid RCA.  2. Coronary calcium score of 459. This was 55th percentile for age and sex matched control.  3.  Normal coronary origin with right dominance.  4.  Aortic atherosclerosis  5.  Likely small PFO with trivial left to right shunt.  Recent Labs: 01/15/2021: TSH 2.335 03/08/2021: ALT 33; BUN 20; Creat 1.23; Potassium 4.2; Sodium 144  Recent Lipid Panel    Component Value Date/Time   CHOL 164 03/08/2021 0836   CHOL 139 12/10/2019 0948   TRIG 142 03/08/2021 0836   HDL 71 03/08/2021 0836   HDL 47 12/10/2019 0948   CHOLHDL 2.3 03/08/2021 0836   LDLCALC 70 03/08/2021 0836    Physical Exam:    VS:  BP 122/80 (BP Location: Right Arm, Patient Position: Sitting, Cuff Size: Normal)   Pulse (!) 52   Ht 5\' 11"  (1.803 m)   Wt 226 lb (102.5 kg)   SpO2 92%   BMI 31.52 kg/m     Wt Readings from Last 3 Encounters:  03/27/21 226 lb (102.5 kg)  03/08/21 225 lb 4.8 oz (102.2 kg)  01/24/21 226 lb (102.5 kg)     GEN: Well nourished, well developed in no acute distress HEENT: Normal NECK: No JVD; No carotid bruits LYMPHATICS: No lymphadenopathy CARDIAC: S1S2 noted,RRR, no murmurs, rubs, gallops RESPIRATORY:  Clear to auscultation without rales, wheezing or rhonchi  ABDOMEN: Soft, non-tender, non-distended, +bowel sounds, no guarding. EXTREMITIES: No edema, No cyanosis, no clubbing MUSCULOSKELETAL:  No deformity  SKIN: Warm and dry NEUROLOGIC:  Alert and oriented x 3, non-focal PSYCHIATRIC:  Normal affect, good insight  ASSESSMENT:    1. Hypertension, unspecified type   2. Paroxysmal atrial fibrillation (HCC)   3. Thoracic aortic aneurysm without rupture ([place])   4. Mild CAD   5. Mixed hyperlipidemia   6. Obesity (BMI 30-39.9)     PLAN:     He appears to be doing well from a coronary artery standpoint.  He offers no symptoms of angina at this time we will continue the patient on his current medication regimen.  In terms of paroxysmal fibrillation he will remain on amiodarone and his apixaban 5 mg twice a day.  Hyperlipidemia - continue with current statin medication.  The patient understands the need to lose weight with diet and exercise. We have discussed specific strategies for this.  His most recent CTA showed dilatation of the proximal ascending aorta 40 mm.  Plan to repeat that study at his next visit.   The patient is in agreement with the above plan. The patient left the office in stable condition.  The patient will follow up in   Medication Adjustments/Labs and Tests Ordered: Current medicines are reviewed at length with the  patient today.  Concerns regarding medicines are outlined above.  No orders of the defined types were placed in this encounter.  No orders of the defined types were placed in this encounter.   Patient Instructions  Medication Instructions:  Your physician recommends that you continue on your current medications as directed. Please refer to the Current Medication list given to you today.  *If you need a refill on your cardiac medications before your next appointment, please call your pharmacy*   Lab Work: None If you have labs (blood work) drawn today and your tests are completely normal, you will receive your results only by: Marland Kitchen MyChart Message (if you have MyChart) OR . A paper copy in the mail If you have any lab test that is abnormal or we need to change your treatment, we will call you to review the results.   Testing/Procedures: None   Follow-Up: At [facility], you and your health needs are our priority.  As part of our continuing mission to provide you with exceptional heart care, we have created designated Provider Care Teams.  These Care Teams include  your primary Cardiologist (physician) and Advanced Practice Providers (APPs -  Physician Assistants and Nurse Practitioners) who all work together to provide you with the care you need, when you need it.  We recommend signing up for the patient portal called "MyChart".  Sign up information is provided on this After Visit Summary.  MyChart is used to connect with patients for Virtual Visits (Telemedicine).  Patients are able to view lab/test results, encounter notes, upcoming appointments, etc.  Non-urgent messages can be sent to your provider as well.   To learn more about what you can do with MyChart, go to [email or web address].    Your next appointment:   1 year(s)  The format for your next appointment:   In Person  Provider:   Berniece Salines, DO   Other Instructions      Adopting a Healthy Lifestyle.  Know what a healthy weight is for you (roughly BMI <25) and aim to maintain this   Aim for 7+ servings of fruits and vegetables daily   65-80+ fluid ounces of water or unsweet tea for healthy kidneys   Limit to max 1 drink of alcohol per day; avoid smoking/tobacco   Limit animal fats in diet for cholesterol and heart health - choose grass fed whenever available   Avoid highly processed foods, and foods high in saturated/trans fats   Aim for low stress - take time to unwind and care for your mental health   Aim for 150 min of moderate intensity exercise weekly for heart health, and weights twice weekly for bone health   Aim for 7-9 hours of sleep daily   When it comes to diets, agreement about the perfect plan isnt easy to find, even among the experts. Experts at the [employer] developed an idea known as the Healthy Eating Plate. Just imagine a plate divided into logical, healthy portions.   The emphasis is on diet quality:   Load up on vegetables and fruits - one-half of your plate: Aim for color and variety, and remember that potatoes dont count.    Go for whole grains - one-quarter of your plate: Whole wheat, barley, wheat berries, quinoa, oats, brown rice, and foods made with them. If you want pasta, go with whole wheat pasta.   Protein power - one-quarter of your plate: Fish, chicken, beans, and nuts are all healthy, versatile  protein sources. Limit red meat.   The diet, however, does go beyond the plate, offering a few other suggestions.   Use healthy plant oils, such as olive, canola, soy, corn, sunflower and peanut. Check the labels, and avoid partially hydrogenated oil, which have unhealthy trans fats.   If youre thirsty, drink water. Coffee and tea are good in moderation, but skip sugary drinks and limit milk and dairy products to one or two daily servings.   The type of carbohydrate in the diet is more important than the amount. Some sources of carbohydrates, such as vegetables, fruits, whole grains, and beans-are healthier than others.   Finally, stay active  Signed, Berniece Salines, DO  03/27/2021 7:35 PM     [facility]

## 2021-03-30 ENCOUNTER — Telehealth: Payer: Self-pay | Admitting: Family Medicine

## 2021-03-30 NOTE — Progress Notes (Signed)
  Chronic Care Management   Note  03/30/2021 Name: [name] MRN: [id number] DOB: 1946/08/23  Joshua [name] is a 75 y.o. year old male who is a primary care patient of [name], DO. I reached out to [employer] [name] by phone today in response to a referral sent by Joshua [name]'s PCP, [name], DO.   [name] was given information about Chronic Care Management services today including:  1. CCM service includes personalized support from designated clinical staff supervised by his physician, including individualized plan of care and coordination with other care providers 2. 24/7 contact phone numbers for assistance for urgent and routine care needs. 3. Service will only be billed when office clinical staff spend 20 minutes or more in a month to coordinate care. 4. Only one practitioner may furnish and bill the service in a calendar month. 5. The patient may stop CCM services at any time (effective at the end of the month) by phone call to the office staff.   Patient did not agree to enrollment in care management services and does not wish to consider at this time.  Follow up plan:   Lauretta Grill [employer] Scheduler

## 2021-03-30 NOTE — Progress Notes (Signed)
  Chronic Care Management   Outreach Note  03/30/2021 Name: Joshua [name] [facility] MRN: [id number] DOB: 04-04-1946  Referred by: [name], DO Reason for referral : No chief complaint on file.   An unsuccessful telephone outreach was attempted today. The patient was referred to the pharmacist for assistance with care management and care coordination.   Follow Up Plan:   Lauretta Grill Upstream Scheduler

## 2021-04-19 ENCOUNTER — Telehealth: Payer: Self-pay | Admitting: Family Medicine

## 2021-04-19 NOTE — Chronic Care Management (AMB) (Signed)
  Chronic Care Management   Outreach Note  04/19/2021 Name: Joshua [name] [name] MRN: [id number] DOB: [birth year]  Referred by: [name], DO Reason for referral : No chief complaint on file.   A second unsuccessful telephone outreach was attempted today. The patient was referred to pharmacist for assistance with care management and care coordination.  Follow Up Plan:   Lauretta Grill Upstream Scheduler

## 2021-04-19 NOTE — Chronic Care Management (AMB) (Signed)
  Chronic Care Management   Note  04/19/2021 Name: Joshua [name] [facility] [name] MRN: [id number] DOB: 1946/10/29  Joshua [name] is a 75 y.o. year old male who is a primary care patient of [name], DO. I reached out to [employer] [name] by phone today in response to a referral sent by Joshua [name]'s PCP, [name], DO.   Mr. Scheel was given information about Chronic Care Management services today including:  CCM service includes personalized support from designated clinical staff supervised by his physician, including individualized plan of care and coordination with other care providers 24/7 contact phone numbers for assistance for urgent and routine care needs. Service will only be billed when office clinical staff spend 20 minutes or more in a month to coordinate care. Only one practitioner may furnish and bill the service in a calendar month. The patient may stop CCM services at any time (effective at the end of the month) by phone call to the office staff.   Patient agreed to services and verbal consent obtained.   Follow up plan:   Lauretta Grill Upstream Scheduler

## 2021-04-23 ENCOUNTER — Other Ambulatory Visit: Payer: Self-pay

## 2021-04-23 ENCOUNTER — Emergency Department (INDEPENDENT_AMBULATORY_CARE_PROVIDER_SITE_OTHER)
Admission: RE | Admit: 2021-04-23 | Discharge: 2021-04-23 | Disposition: A | Discharge status: Home / Self Care | Payer: Medicare HMO | Source: Ambulatory Visit

## 2021-04-23 VITALS — BP 110/69 | HR 75 | Temp 98.8°F | Resp 17

## 2021-04-23 DIAGNOSIS — T63441A Toxic effect of venom of bees, accidental (unintentional), initial encounter: Secondary | ICD-10-CM | POA: Diagnosis not present

## 2021-04-23 MED ORDER — METHYLPREDNISOLONE ACETATE 80 MG/ML IJ SUSP
80.0000 mg | Freq: Once | INTRAMUSCULAR | Status: AC
  Administered 2021-04-23: 80 mg via INTRAMUSCULAR

## 2021-04-23 MED ORDER — PREDNISONE 20 MG PO TABS: ORAL_TABLET | ORAL | 0 refills | Status: DC

## 2021-04-23 NOTE — Discharge Instructions (Addendum)
Advise/instructed patient to start oral prednisone burst tomorrow morning, Tuesday, 04/24/2021.  Advised patient to take medication with food as directed to completion.  Encourage patient increase daily water intake while taking this medication

## 2021-04-23 NOTE — ED Provider Notes (Signed)
Joshua [name] CARE    CSN: [id number] Arrival date & time: 04/23/21  1155      History   Chief Complaint Chief Complaint  Patient presents with   insect  sting    HPI Joshua [name] is a 75 y.o. male.   HPI 75 year old male presents for yellow jacket stings that occurred on Saturday (04/21/21)  morning while mowing lawn.  Reports stings of left wrist and right lower leg.  Reports taking Benadryl Saturday and Sunday last dose was at 8 PM last night.  Past Medical History:  Diagnosis Date   Abnormal findings on diagnostic imaging of lung 09/23/2019   09/22/2019-lung cancer screening-centrilobular and paraseptal emphysema, calcified granulomata noted bilaterally, no suspicious nodules, lung RADS 1, repeat in 12 months    Atrial fibrillation ([place]) 09/23/2019   Centrilobular emphysema ([place]) 09/23/2019   09/22/2019-lung cancer screening-centrilobular and paraseptal emphysema, calcified granulomata noted bilaterally, no suspicious nodules, lung RADS 1, repeat in 12 months  09/23/2019-FVC 3.74 (81% predicted), postbronchodilator ratio 80, postbronchodilator FEV1 2.88 (86% predicted), no bronchodilator response, DLCO 19.44 (73% predicted)   DDD (degenerative disc disease), cervical 05/13/2008   Cervical Disc Degeneration   History of adenomatous polyp of colon 03/03/2018   05/14/17: Colonoscopy: nonadvanced adenoma, f/u 5 yrs, use SuPrep, [place]/GAP  Last Assessment & Plan:  Relevant Hx: Course: Daily Update: Today's Plan:   Hyperlipidemia    Kidney stone    Nephrolithiasis 05/22/2011   Nontraumatic incomplete tear of right rotator cuff 10/14/2018   Other and unspecified hyperlipidemia 11/20/2009   Hyperlipidemia   RBBB 06/09/2012   Secondary hypercoagulable state ([place]) 09/28/2019   Shortness of breath 09/23/2019    Patient Active Problem List   Diagnosis Date Noted   Hypertension 03/27/2021   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   Hyperlipidemia    Urinary hesitancy 03/08/2021   Thoracic  aortic aneurysm without rupture ([place]) 03/08/2021   Kidney stone    Senile purpura ([place]) 09/07/2020   Aortic atherosclerosis ([place]) 09/07/2020   Sinus bradycardia 03/31/2020   Essential hypertension 03/31/2020   Major depressive disorder 03/07/2020   Essential tremor 03/07/2020   Tremor 01/21/2020   Obesity (BMI 30-39.9) 12/10/2019   Chest pain of uncertain etiology 09/47/0962   Secondary hypercoagulable state ([place]) 09/28/2019   Centrilobular emphysema ([place]) 09/23/2019   Former smoker 09/23/2019   Abnormal findings on diagnostic imaging of lung 09/23/2019   Atrial fibrillation ([place]) 09/23/2019   At risk for sleep apnea 09/23/2019   Shortness of breath 09/23/2019   Nontraumatic incomplete tear of right rotator cuff 10/14/2018   History of adenomatous polyp of colon 03/03/2018   RBBB 06/09/2012   Nephrolithiasis 05/22/2011   Mixed hyperlipidemia 11/20/2009   DDD (degenerative disc disease), cervical 05/13/2008    Past Surgical History:  Procedure Laterality Date   CARDIOVERSION N/A 10/15/2019   Procedure: CARDIOVERSION;  Surgeon: Jerline Pain, MD;  Location: [facility] ENDOSCOPY;  Service: Cardiovascular;  Laterality: N/A;   CARDIOVERSION N/A 11/29/2019   Procedure: CARDIOVERSION;  Surgeon: Dorothy Spark, MD;  Location: [facility] ENDOSCOPY;  Service: Cardiovascular;  Laterality: N/A;   HEMORRHOID SURGERY         Home Medications    Prior to Admission medications   Medication Sig Start Date End Date Taking? Authorizing Provider  predniSONE (DELTASONE) 20 MG tablet Take 3 tabs PO daily x 5 days. 04/23/21  Yes Eliezer Lofts, FNP  acetaminophen (TYLENOL) 500 MG tablet Take 1,000 mg by mouth every 6 (six) hours as needed (for pain.).  [provider]  amiodarone (PACERONE) 200 MG tablet Take 0.5 tablets (100 mg total) by mouth daily. 02/21/21   Fenton, Clint R, PA  apixaban (ELIQUIS) 5 MG TABS tablet Take 5 mg by mouth 2 (two) times daily.    [provider]   atorvastatin (LIPITOR) 40 MG tablet Take 1 tablet (40 mg total) by mouth daily. 10/03/20   Tobb, Kardie, DO  Cholecalciferol 25 MCG (1000 UT) tablet Take 1,000 Units by mouth daily.    [provider]  clobetasol (TEMOVATE) 0.05 % external solution Apply 1 application topically. Uses Monday and Thursday 03/09/20   [provider]  FLUoxetine (PROZAC) 20 MG capsule Take 20 mg by mouth daily.     [provider]  lamoTRIgine 50 MG TBDP Take 100 mg by mouth daily.    [provider]  methocarbamol (ROBAXIN) 500 MG tablet Take 500 mg by mouth as needed for muscle spasms.    [provider]  tamsulosin (FLOMAX) 0.4 MG CAPS capsule Take 0.4 mg by mouth 3 (three) times a week.    [provider]    Family History Family History  Problem Relation Age of Onset   Varicose Veins Mother    Stroke Father     Social History Social History   Tobacco Use   Smoking status: Former    Packs/day: 2.00    Years: 46.00    Pack years: 92.00    Types: Cigarettes    Quit date: 2010    Years since quitting: 12.4   Smokeless tobacco: Never  Vaping Use   Vaping Use: Never used  Substance Use Topics   Alcohol use: Yes    Comment: 2-6 drinks a week   Drug use: No     Allergies   Venlafaxine, Bupropion, and Zoloft [sertraline hcl]   Review of Systems Review of Systems  Constitutional: Negative.   HENT: Negative.    Eyes: Negative.   Respiratory: Negative.    Cardiovascular: Negative.   Gastrointestinal: Negative.   Genitourinary: Negative.   Musculoskeletal: Negative.   Skin:  Positive for rash.  Neurological: Negative.     Physical Exam Triage Vital Signs ED Triage Vitals [04/23/21 1218]  Enc Vitals Group     BP 110/69     Pulse Rate 75     Resp 17     Temp 98.8 F (37.1 C)     Temp Source Oral     SpO2 97 %     Weight      Height      Head Circumference      Peak Flow      Pain Score 4     Pain Loc      Pain Edu?       Excl. in [place]?    No data found.  Updated Vital Signs BP 110/69 (BP Location: Right Arm)   Pulse 75   Temp 98.8 F (37.1 C) (Oral)   Resp 17   SpO2 97%      Physical Exam Vitals and nursing note reviewed.  Constitutional:      General: He is not in acute distress.    Appearance: Normal appearance. He is not ill-appearing.  HENT:     Head: Normocephalic and atraumatic.     Mouth/Throat:     Mouth: Mucous membranes are moist.     Pharynx: Oropharynx is clear.  Eyes:     Extraocular Movements: Extraocular movements intact.     Conjunctiva/sclera: Conjunctivae  normal.     Pupils: Pupils are equal, round, and reactive to light.  Cardiovascular:     Rate and Rhythm: Normal rate and regular rhythm.     Pulses: Normal pulses.     Heart sounds: Normal heart sounds.  Pulmonary:     Effort: Pulmonary effort is normal. No respiratory distress.     Breath sounds: Normal breath sounds. No wheezing, rhonchi or rales.  Musculoskeletal:        General: Normal range of motion.     Cervical back: Normal range of motion and neck supple.  Skin:    Comments: Left wrist (anterior inferior aspect) and right lower leg (inferior medial aspect): Mild soft tissue swelling, diffuse erythematous rash noted, pruritic in nature  Neurological:     General: No focal deficit present.     Mental Status: He is alert and oriented to person, place, and time.  Psychiatric:        Mood and Affect: Mood normal.        Behavior: Behavior normal.     UC Treatments / Results  Labs (all labs ordered are listed, but only abnormal results are displayed) Labs Reviewed - No data to display  EKG   Radiology No results found.  Procedures Procedures (including critical care time)  Medications Ordered in UC Medications  methylPREDNISolone acetate (DEPO-MEDROL) injection 80 mg (80 mg Intramuscular Given 04/23/21 1254)    Initial Impression / Assessment and Plan / UC Course  I have reviewed the triage  vital signs and the nursing notes.  Pertinent labs & imaging results that were available during my care of the patient were reviewed by me and considered in my medical decision making (see chart for details).     MDM: 1.  Bee sting initial encounter.  Patient discharged home, hemodynamically stable. Final Clinical Impressions(s) / UC Diagnoses   Final diagnoses:  Bee sting, accidental or unintentional, initial encounter     Discharge Instructions      Advise/instructed patient to start oral prednisone burst tomorrow morning, Tuesday, 04/24/2021.  Advised patient to take medication with food as directed to completion.  Encourage patient increase daily water intake while taking this medication     ED Prescriptions     Medication Sig Dispense Auth. Provider   predniSONE (DELTASONE) 20 MG tablet Take 3 tabs PO daily x 5 days. 15 tablet Eliezer Lofts, FNP      PDMP not reviewed this encounter.   Eliezer Lofts, [place] 04/23/21 1302

## 2021-04-23 NOTE — ED Triage Notes (Signed)
Stung by yellow jackets on Saturday while mowing lawn  Stung on left wrist  &  right lower leg  Felt bad since then  Took benadryl Sat & Sun Last dose was 8pm last night Not sure if he needs an epi pen

## 2021-05-21 ENCOUNTER — Telehealth: Payer: Self-pay | Admitting: Pharmacist

## 2021-05-21 NOTE — Chronic Care Management (AMB) (Signed)
    Chronic Care Management Pharmacy Assistant   Name: Joshua [name]  MRN: [id number] DOB: [birth year]  Joshua [name] is an 75 y.o. year old male who presents for his initial CCM visit with the clinical pharmacist.  Reason for Encounter: Initial CCM Visit  Recent office visits:  03/08/21- [name], DO - seen for follow up visit, no medication changes, follow up 6 months   Recent consult visits:  04/23/21- Eliezer Lofts, FNP (Urgent Care)- seen for bee sting, short course prednisone 20 mg x 5 days, no follow up documented  04/06/21- [facility]un[facility] Blase[facility] for follow up care of unspecified mood disorder, increased lamictal to 75 mg daily for 14 days then 100 mg daily, follow up 4 weeks  03/27/21- Godfrey Pick Tobb, DO (Cardiology)- seen for follow up visit for hypertension , no medication changes, follow up 1 yr 02/07/21- Corlis Leak, MD (Urology)- seen for follow up of nephrolithiasis, x-ray of abdomen ap ordered, follow up 1 yr  02/02/21- Saidivya Komma ( Optometry)- seen for eye exam , no medication changes, follow up 1 yr  01/24/21- Butler Denmark, NP (Neurology)- seen for follow up of essential tremor, no medication changes, follow up 8 months  01/15/21- Malka So, PA (Cardiology)- seen for follow up of persistent, f atrial fibrillation, no medication changes, follow up as scheduled   Hospital visits:  02[facility] Emergency Department- Ureterolithiasis- No content 12/15/20- [facility] Medical Surgical Department- Kidney Stone- Cystoscopy  Medications: Outpatient Encounter Medications as of 05/21/2021  Medication Sig   acetaminophen (TYLENOL) 500 MG tablet Take 1,000 mg by mouth every 6 (six) hours as needed (for pain.).   amiodarone (PACERONE) 200 MG tablet Take 0.5 tablets (100 mg total) by mouth daily.   apixaban (ELIQUIS) 5 MG TABS tablet Take 5 mg by mouth 2 (two) times daily.   atorvastatin (LIPITOR) 40 MG tablet Take 1 tablet (40 mg total) by mouth daily.    Cholecalciferol 25 MCG (1000 UT) tablet Take 1,000 Units by mouth daily.   clobetasol (TEMOVATE) 0.05 % external solution Apply 1 application topically. Uses Monday and Thursday   FLUoxetine (PROZAC) 20 MG capsule Take 20 mg by mouth daily.    lamoTRIgine 50 MG TBDP Take 100 mg by mouth daily.   methocarbamol (ROBAXIN) 500 MG tablet Take 500 mg by mouth as needed for muscle spasms.   predniSONE (DELTASONE) 20 MG tablet Take 3 tabs PO daily x 5 days.   tamsulosin (FLOMAX) 0.4 MG CAPS capsule Take 0.4 mg by mouth 3 (three) times a week.   No facility-administered encounter medications on file as of 05/21/2021.    Current Documented Medications acetaminophen  500 MG tablet amiodarone 200 MG - 90 DS last filled 02/21/21 apixaban 5 MG TABS- 30 DS last filled 04/22/21 atorvastatin  40 MG - 90 DS last filled 04/14/21 Cholecalciferol 25 MCG  tablet clobetasol  0.05 % external solution FLUoxetine  20 MG capsule lamoTRIgine 50 MG TBDP methocarbamol  500 MG tablet predniSONE  20 MG tablet tamsulosin  0.4 MG CAPS capsule  Wilford Sports CPA, CMA

## 2021-05-23 ENCOUNTER — Telehealth: Payer: Self-pay

## 2021-05-23 NOTE — Telephone Encounter (Signed)
Received vm from patient asking for clarity on his 05/28/2021 appt.   Returned patient's call. No answer. LVM advising 05/28/21 appt is not with Dr. Zigmund Daniel. It is a phone call that he will receive from the Pharmacist to go over his medications.

## 2021-05-28 ENCOUNTER — Telehealth: Payer: Medicare HMO

## 2021-07-10 DIAGNOSIS — L218 Other seborrheic dermatitis: Secondary | ICD-10-CM | POA: Diagnosis not present

## 2021-07-10 DIAGNOSIS — L570 Actinic keratosis: Secondary | ICD-10-CM | POA: Diagnosis not present

## 2021-07-10 DIAGNOSIS — L820 Inflamed seborrheic keratosis: Secondary | ICD-10-CM | POA: Diagnosis not present

## 2021-07-10 DIAGNOSIS — L814 Other melanin hyperpigmentation: Secondary | ICD-10-CM | POA: Diagnosis not present

## 2021-07-11 ENCOUNTER — Ambulatory Visit (INDEPENDENT_AMBULATORY_CARE_PROVIDER_SITE_OTHER): Payer: Medicare HMO | Admitting: Pharmacist

## 2021-07-11 ENCOUNTER — Other Ambulatory Visit: Payer: Self-pay

## 2021-07-11 DIAGNOSIS — E782 Mixed hyperlipidemia: Secondary | ICD-10-CM

## 2021-07-11 DIAGNOSIS — I480 Paroxysmal atrial fibrillation: Secondary | ICD-10-CM

## 2021-07-11 NOTE — Progress Notes (Signed)
Chronic Care Management Pharmacy Note  07/11/2021 Name:  Joshua [name] [facility] [name] MRN:  [id number] DOB:  22-Nov-1945  Summary: addressed diagnoses of HTN, HLD, Afib.  Recommendations/Changes made from today's visit: none, all going well  Plan: f/u with pharmacist in 1 yr  Subjective: Joshua [name] is an 75 y.o. year old male who is a primary patient of Joshua Nutting, DO.  The CCM team was consulted for assistance with disease management and care coordination needs.    Engaged with patient by telephone for initial visit in response to provider referral for pharmacy case management and/or care coordination services.   Consent to Services:  The patient was given information about Chronic Care Management services, agreed to services, and gave verbal consent prior to initiation of services.  Please see initial visit note for detailed documentation.   Patient Care Team: Joshua Nutting, DO as PCP - General (Family Medicine) Berniece Salines, DO as PCP - Cardiology (Cardiology) Darius Bump, [facility] as Pharmacist (Pharmacist)  Objective:  Lab Results  Component Value Date   CREATININE 1.23 (H) 03/08/2021   CREATININE 1.32 (H) 01/15/2021   CREATININE 1.38 (H) 10/26/2020       Component Value Date/Time   CHOL 164 03/08/2021 0836   CHOL 139 12/10/2019 0948   TRIG 142 03/08/2021 0836   HDL 71 03/08/2021 0836   HDL 47 12/10/2019 0948   CHOLHDL 2.3 03/08/2021 0836   LDLCALC 70 03/08/2021 0836    Hepatic Function Latest Ref Rng & Units 03/08/2021 01/15/2021 07/17/2020  Total Protein 6.1 - 8.1 g/dL 7.1 7.0 6.8  Albumin 3.5 - 5.0 g/dL - 3.8 3.5  AST 10 - 35 U/L 36(H) 44(H) 35  ALT 9 - 46 U/L 33 41 26  Alk Phosphatase 38 - 126 U/L - 74 81  Total Bilirubin 0.2 - 1.2 mg/dL 0.6 1.5(H) 0.7    Lab Results  Component Value Date/Time   TSH 2.335 01/15/2021 08:52 AM   TSH 2.730 07/17/2020 10:17 AM   TSH 3.970 01/21/2020 09:38 AM   TSH 1.84 09/23/2019 11:13 AM   FREET4 1.24 (H) 11/22/2019  09:36 AM    CBC Latest Ref Rng & Units 11/22/2019 10/15/2019 09/23/2019  WBC 4.0 - 10.5 K/uL 3.5(L) 3.6(L) 5.1  Hemoglobin 13.0 - 17.0 g/dL 14.4 15.3 14.2  Hematocrit 39.0 - 52.0 % 43.8 45.4 42.4  Platelets 150 - 400 K/uL 175 190 194.0    No results found for: VD25OH  Clinical ASCVD:    The 10-year ASCVD risk score [name], et al., 2013) is: 15%   Values used to calculate the score:     Age: 20 years     Sex: Male     Is Non-Hispanic African American: No     Diabetic: No     Tobacco smoker: Yes     Systolic Blood Pressure: 448 mmHg     Is BP treated: No     HDL Cholesterol: 71 mg/dL     Total Cholesterol: 164 mg/dL     Social History   Tobacco Use  Smoking Status Former   Packs/day: 2.00   Years: 46.00   Pack years: 92.00   Types: Cigarettes   Quit date: 2010   Years since quitting: 12.6  Smokeless Tobacco Never   BP Readings from Last 3 Encounters:  04/23/21 110/69  03/27/21 122/80  03/08/21 138/80   Pulse Readings from Last 3 Encounters:  04/23/21 75  03/27/21 (!) 52  03/08/21 (!) 50  Wt Readings from Last 3 Encounters:  03/27/21 226 lb (102.5 kg)  03/08/21 225 lb 4.8 oz (102.2 kg)  01/24/21 226 lb (102.5 kg)    Assessment: Review of patient past medical history, allergies, medications, health status, including review of consultants reports, laboratory and other test data, was performed as part of comprehensive evaluation and provision of chronic care management services.   SDOH:  (Social Determinants of Health) assessments and interventions performed:    CCM Care Plan  Allergies  Allergen Reactions   Venlafaxine Palpitations   Bupropion Other (See Comments)    unknown   Zoloft [Sertraline Hcl] Other (See Comments)    Tick    Medications Reviewed Today     Reviewed by Eliezer Lofts, FNP (Family Nurse Practitioner) on 04/23/21 at 62  Med List Status: <None>   Medication Order Taking? Sig Documenting Provider Last Dose Status Informant   acetaminophen (TYLENOL) 500 MG tablet [id number]  Take 1,000 mg by mouth every 6 (six) hours as needed (for pain.). [provider]  Active Self  amiodarone (PACERONE) 200 MG tablet [id number]  Take 0.5 tablets (100 mg total) by mouth daily. Fenton, Clint R, PA  Active   apixaban (ELIQUIS) 5 MG TABS tablet [id number]  Take 5 mg by mouth 2 (two) times daily. [provider]  Active   atorvastatin (LIPITOR) 40 MG tablet [id number]  Take 1 tablet (40 mg total) by mouth daily. Berniece Salines, DO  Active   Cholecalciferol 25 MCG (1000 UT) tablet [id number]  Take 1,000 Units by mouth daily. [provider]  Active   clobetasol (TEMOVATE) 0.05 % external solution [id number]  Apply 1 application topically. Uses Monday and Thursday [provider]  Active   FLUoxetine (PROZAC) 20 MG capsule [id number]  Take 20 mg by mouth daily.  [provider]  Active Self  lamoTRIgine 50 MG TBDP [id number]  Take 100 mg by mouth daily. [provider]  Active Self  methocarbamol (ROBAXIN) 500 MG tablet [id number]  Take 500 mg by mouth as needed for muscle spasms. [provider]  Active   tamsulosin (FLOMAX) 0.4 MG CAPS capsule [id number]  Take 0.4 mg by mouth 3 (three) times a week. 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   Hyperlipidemia    Urinary hesitancy 03/08/2021   Thoracic aortic aneurysm without rupture ([place]) 03/08/2021   Kidney stone    Senile purpura ([place]) 09/07/2020   Aortic atherosclerosis ([place]) 09/07/2020   Sinus bradycardia 03/31/2020   Essential hypertension 03/31/2020   Major depressive disorder 03/07/2020   Essential tremor 03/07/2020   Tremor 01/21/2020   Obesity (BMI 30-39.9) 12/10/2019   Chest pain of uncertain etiology 88/50/2774   Secondary hypercoagulable state ([place]) 09/28/2019   Centrilobular emphysema ([place]) 09/23/2019    Former smoker 09/23/2019   Abnormal findings on diagnostic imaging of lung 09/23/2019   Atrial fibrillation ([place]) 09/23/2019   At risk for sleep apnea 09/23/2019   Shortness of breath 09/23/2019   Nontraumatic incomplete tear of right rotator cuff 10/14/2018   History of adenomatous polyp of colon 03/03/2018   RBBB 06/09/2012   Nephrolithiasis 05/22/2011   Mixed hyperlipidemia 11/20/2009   DDD (degenerative disc disease), cervical 05/13/2008    Immunization History  Administered Date(s) Administered   Fluad Quad(high Dose 65+) 06/21/2019, 08/14/2020   Hep A / Hep B  09/03/2019, 10/04/2019, 01/31/2020   Hepatitis A 10/04/2019, 01/31/2020   Hepatitis A, Adult 09/03/2019   Hepatitis B 09/03/2019, 10/04/2019, 01/31/2020   Influenza Split 08/18/2014, 07/05/2017   Influenza, High Dose Seasonal PF 09/04/2014, 08/21/2015, 08/04/2016   Influenza,inj,Quad PF,6+ Mos 08/11/2019   Influenza,trivalent, recombinat, inj, PF 08/16/2013   Influenza-Unspecified 11/04/2012, 08/28/2018, 08/04/2020   PFIZER(Purple Top)SARS-COV-2 Vaccination 01/04/2019, 02/01/2019, 08/21/2020   Pneumococcal Conjugate-13 05/25/2014, 10/05/2018   Pneumococcal Polysaccharide-23 08/16/2013, 06/01/2015   Tdap 08/04/2008, 05/24/2009, 02/01/2016   Zoster Recombinat (Shingrix) 09/03/2019, 12/03/2019   Zoster, Live 08/15/2015    Conditions to be addressed/monitored: Atrial Fibrillation and HLD  There are no care plans that you recently modified to display for this patient.   Medication Assistance: None required.  Patient affirms current coverage meets needs.  Patient's preferred pharmacy is:  [id number] - [place], [address] [place] [address] Phone: [phone] Fax: [phone]  Uses pill box? Yes Pt endorses 100% compliance  Follow Up:  Patient agrees to Care Plan and Follow-up.  Plan: Telephone follow up appointment with care management team member scheduled  for:  1 year  Darius Bump

## 2021-07-11 NOTE — Patient Instructions (Signed)
Visit Information   PATIENT GOALS:   Goals Addressed             This Visit's Progress    Medication Management       Patient Goals/Self-Care Activities Over the next 365 days, patient will:  take medications as prescribed  Follow Up Plan: Telephone follow up appointment with care management team member scheduled for:  1 year          Consent to CCM Services: Joshua [name] was given information about Chronic Care Management services including:  CCM service includes personalized support from designated clinical staff supervised by his physician, including individualized plan of care and coordination with other care providers 24/7 contact phone numbers for assistance for urgent and routine care needs. Service will only be billed when office clinical staff spend 20 minutes or more in a month to coordinate care. Only one practitioner may furnish and bill the service in a calendar month. The patient may stop CCM services at any time (effective at the end of the month) by phone call to the office staff. The patient will be responsible for cost sharing (co-pay) of up to 20% of the service fee (after annual deductible is met).  Patient agreed to services and verbal consent obtained.   Patient verbalizes understanding of instructions provided today and agrees to view in [place].   Telephone follow up appointment with care management team member scheduled for: 1 year  Joshua [name]   CLINICAL CARE PLAN: Patient Care Plan: Medication Management     Problem Identified: HTN, HLD, Afib      Long-Range Goal: Disease Progression Prevention   Start Date: 07/11/2021  This Visit's Progress: On track  Priority: High  Note:   Current Barriers:  None at present  Pharmacist Clinical Goal(s):  Over the next 365 days, patient will maintain control of chronic conditions as evidenced by medication fill history, lab values, and vital signs  through collaboration with PharmD and provider.    Interventions: 1:1 collaboration with [name], DO regarding development and update of comprehensive plan of care as evidenced by provider attestation and co-signature Inter-disciplinary care team collaboration (see longitudinal plan of care) Comprehensive medication review performed; medication list updated in electronic medical record  Hypertension:  Controlled; current treatment:no medication at this time;   Current home readings: usually runs low  Denies hypotensive/hypertensive symptoms  Recommended continue current regimen,  Hyperlipidemia:  Controlled; current treatment:atorvastatin 38m daily; LDL 70  Recommended continue current regimen Atrial Fibrillation:  Controlled; current rate/rhythm control: amiodarone 1022mdaily; anticoagulant treatment: apixaban 63m49mID  Home blood pressure, heart rate readings:   Counseled on s/sx of bleeding,  Recommended continue current regimen  Patient Goals/Self-Care Activities Over the next 365 days, patient will:  take medications as prescribed  Follow Up Plan: Telephone follow up appointment with care management team member scheduled for:  1 year

## 2021-07-14 IMAGING — CT CT ANGIO CHEST
2 of 6 series · 18 of 36 positions shown · IV contrast (omnipaque)
Comparison: Chest radiograph earlier this day. Chest CT lung cancer
screening yesterday.

CLINICAL DATA: PE suspected, intermediate prob, positive D-dimer

Shortness of breath with exertion for months.
EXAM:
CT ANGIOGRAPHY CHEST WITH CONTRAST
TECHNIQUE: Multidetector CT imaging of the chest was performed using the
standard protocol during bolus administration of intravenous
contrast. Multiplanar CT image reconstructions and MIPs were
obtained to evaluate the vascular anatomy.
CONTRAST:  100mL OMNIPAQUE IOHEXOL 350 MG/ML SOLN

[Series 7: pe thins · axial · 0.84mm/px · z∈[+880,+1156]mm · 17 of 438 slices shown]
[im 22/438  lung]
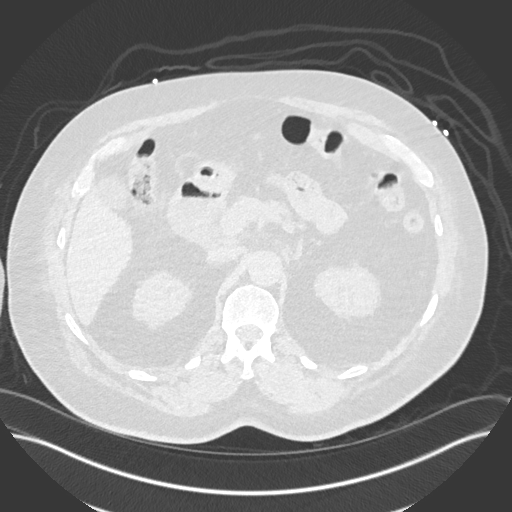
[im 44/438  mediastinal]
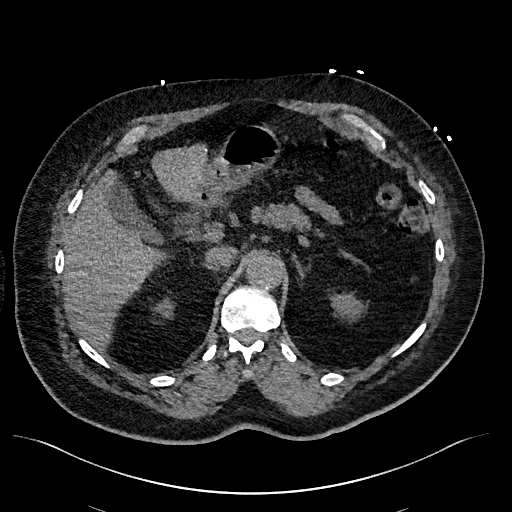
[im 66/438  lung]
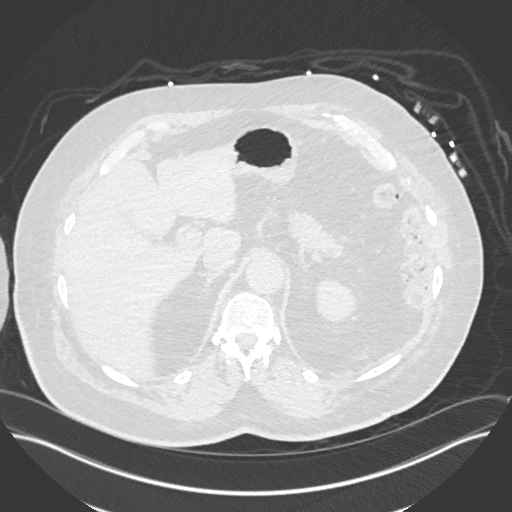
[im 88/438  mediastinal]
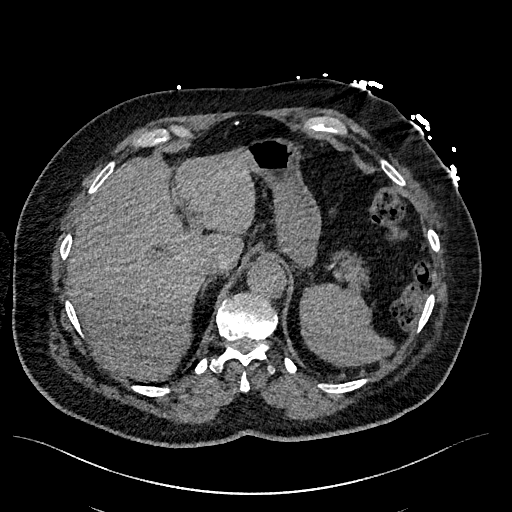
[im 132/438  lung]
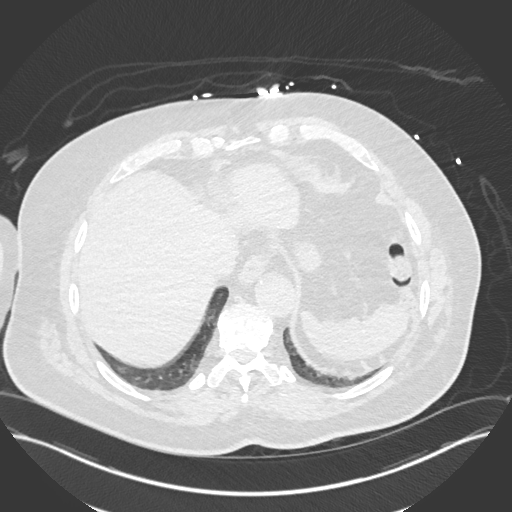
[im 153/438  mediastinal]
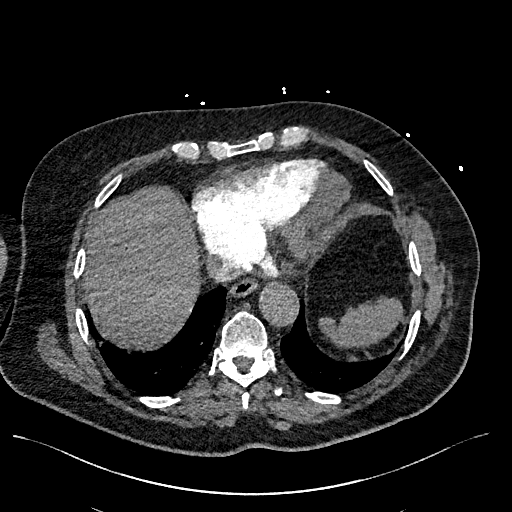
[im 175/438  lung]
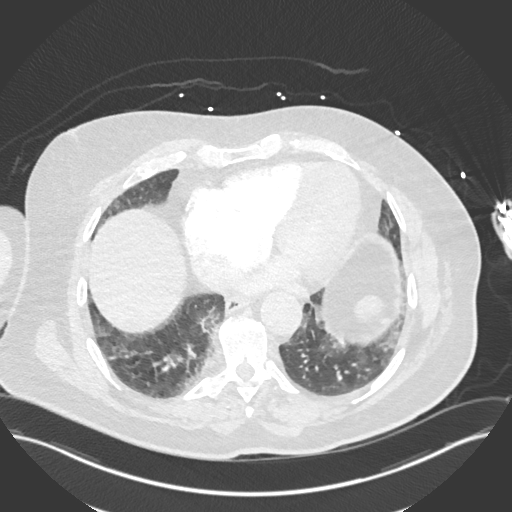
[im 197/438  mediastinal]
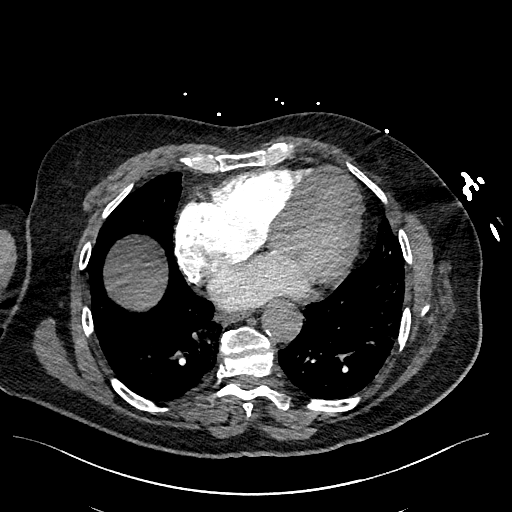
[im 219/438  lung]
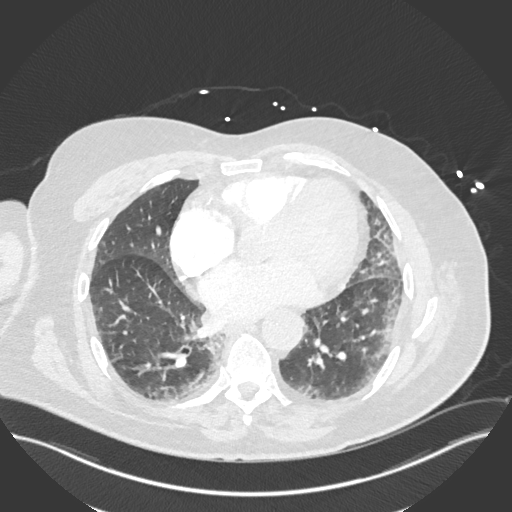
[im 241/438  mediastinal]
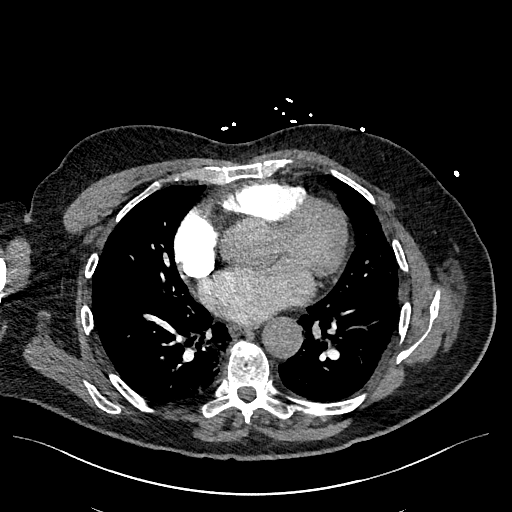
[im 263/438  lung]
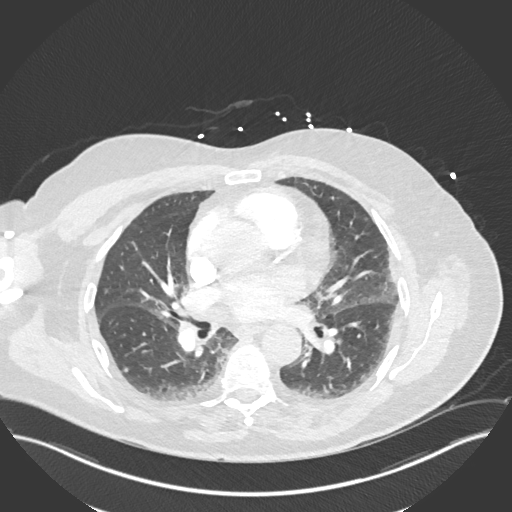
[im 285/438  mediastinal]
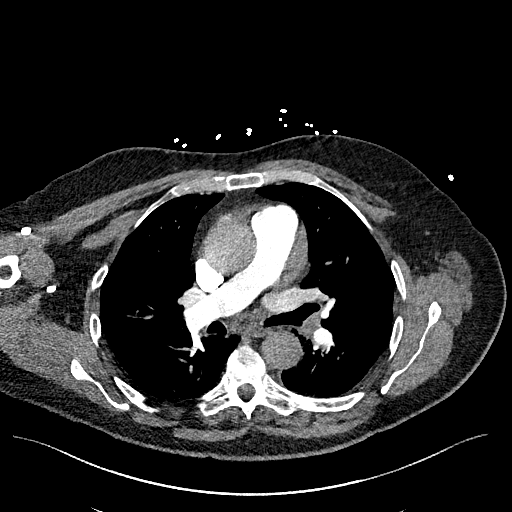
[im 306/438  lung]
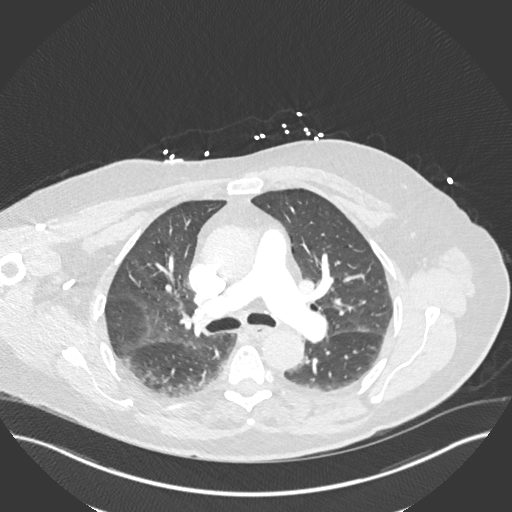
[im 350/438  mediastinal]
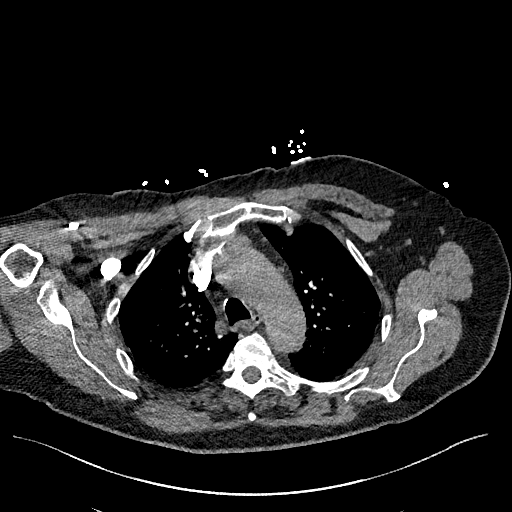
[im 372/438  lung]
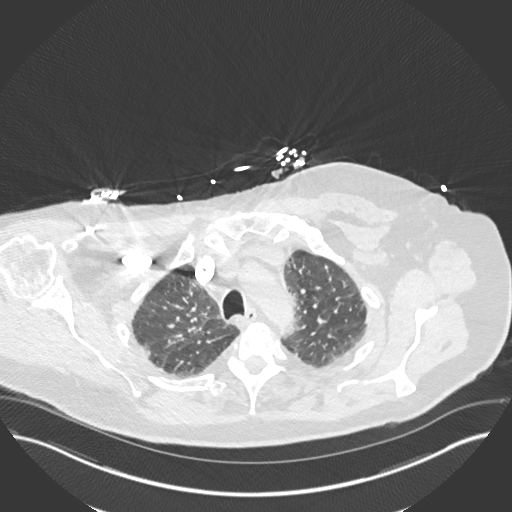
[im 394/438  mediastinal]
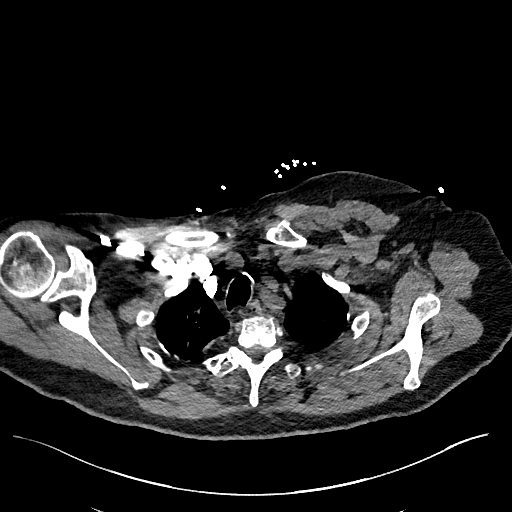
[im 416/438  lung]
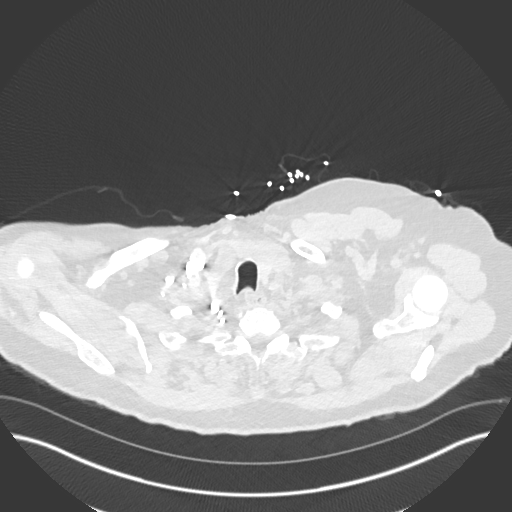

[Series 8: pe 2mm cor · coronal · 0.60mm/px · 1 of 151 slices shown]
[im 76/151  mediastinal]
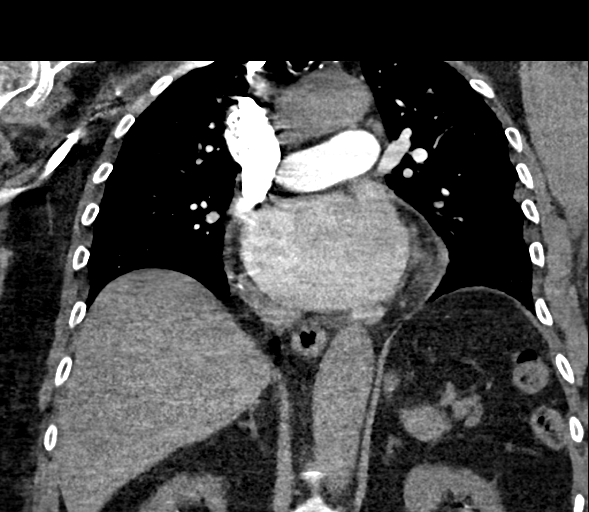

[18 of 36 positions shown; findings below may reference images not displayed]

FINDINGS: Cardiovascular: There are no filling defects within the pulmonary
arteries to suggest pulmonary embolus. Upper normal heart size. No
pericardial effusion. There are coronary artery calcifications. Mild
aortic atherosclerosis. No aortic aneurysm. Cannot assess for
dissection given phase of contrast tailored for pulmonary artery
evaluation.

Mediastinum/Nodes: Small mediastinal nodes not enlarged by size
criteria. No hilar adenopathy. Small calcified nodes at the right
hilum again seen consistent with prior granulomatous disease. No
esophageal wall thickening.

Lungs/Pleura: Mild hypoventilatory changes in the dependent lungs,
new from CT yesterday. No focal airspace disease or pleural
effusion. Subpleural reticulation, not as well assessed given mild
motion artifact currently. Scattered calcified granuloma again seen.
Mild emphysema. Trachea and mainstem bronchi are patent. No
pulmonary edema. Biapical pleuroparenchymal scarring.

Upper Abdomen: Calcified splenic granuloma. No acute findings.
Nonobstructing stone in the upper left kidney.

Musculoskeletal: There are no acute or suspicious osseous
abnormalities. Mild anterior wedging of midthoracic vertebra,
unchanged.

Review of the MIP images confirms the above findings.
IMPRESSION: 1. No pulmonary embolus.
2. Mild hypoventilatory atelectasis, otherwise no change from lung
cancer screening CT yesterday. No acute findings.
3. Mild emphysema. Subpleural reticulation prior exam is currently
partially obscured by breathing motion.
4. Coronary arteries.  Mild aortic atherosclerosis.

Aortic Atherosclerosis (7W679-[id number].F) and Emphysema (7W679-ZB4.S).

## 2021-08-03 DIAGNOSIS — I480 Paroxysmal atrial fibrillation: Secondary | ICD-10-CM

## 2021-08-03 DIAGNOSIS — E782 Mixed hyperlipidemia: Secondary | ICD-10-CM

## 2021-09-10 ENCOUNTER — Encounter: Payer: Self-pay | Admitting: Family Medicine

## 2021-09-10 ENCOUNTER — Ambulatory Visit (INDEPENDENT_AMBULATORY_CARE_PROVIDER_SITE_OTHER): Payer: Medicare HMO | Admitting: Family Medicine

## 2021-09-10 ENCOUNTER — Other Ambulatory Visit: Payer: Self-pay

## 2021-09-10 VITALS — BP 107/70 | HR 60 | Temp 97.5°F | Ht 71.0 in | Wt 225.0 lb

## 2021-09-10 DIAGNOSIS — R7989 Other specified abnormal findings of blood chemistry: Secondary | ICD-10-CM | POA: Diagnosis not present

## 2021-09-10 DIAGNOSIS — E785 Hyperlipidemia, unspecified: Secondary | ICD-10-CM

## 2021-09-10 DIAGNOSIS — I480 Paroxysmal atrial fibrillation: Secondary | ICD-10-CM | POA: Diagnosis not present

## 2021-09-10 DIAGNOSIS — I700 Atherosclerosis of aorta: Secondary | ICD-10-CM

## 2021-09-10 DIAGNOSIS — N200 Calculus of kidney: Secondary | ICD-10-CM

## 2021-09-10 DIAGNOSIS — E782 Mixed hyperlipidemia: Secondary | ICD-10-CM

## 2021-09-10 DIAGNOSIS — I10 Essential (primary) hypertension: Secondary | ICD-10-CM

## 2021-09-10 DIAGNOSIS — F3341 Major depressive disorder, recurrent, in partial remission: Secondary | ICD-10-CM | POA: Diagnosis not present

## 2021-09-10 DIAGNOSIS — D692 Other nonthrombocytopenic purpura: Secondary | ICD-10-CM | POA: Diagnosis not present

## 2021-09-10 DIAGNOSIS — D6869 Other thrombophilia: Secondary | ICD-10-CM | POA: Diagnosis not present

## 2021-09-10 NOTE — Assessment & Plan Note (Signed)
Regular rate and rhythm on exam today.  No symptoms at this time.  He will continue management per cardiology.  Updated labs with her today.

## 2021-09-10 NOTE — Assessment & Plan Note (Signed)
This is managed through psychiatry at the [place].  He will continue on current medications.

## 2021-09-10 NOTE — Patient Instructions (Signed)
Try exercises for hip.  Let me know if not improving with this.

## 2021-09-10 NOTE — Assessment & Plan Note (Signed)
Blood pressure remains well controlled at this time.  He will continue current medications for management of hypertension.

## 2021-09-10 NOTE — Assessment & Plan Note (Signed)
Once again reassured.  Update CBC today.

## 2021-09-10 NOTE — Assessment & Plan Note (Signed)
Update lipid panel.  

## 2021-09-10 NOTE — Assessment & Plan Note (Signed)
He is doing well with eliquis.  Continue at current strength for anticoagulation related to his A. fib.

## 2021-09-10 NOTE — Assessment & Plan Note (Signed)
History of kidney stones.  He remains on Flomax.  He will continue management per urology.

## 2021-09-10 NOTE — Progress Notes (Signed)
Joshua [name] - 75 y.o. male MRN [id number]  Date of birth: 04/02/1946  Subjective Chief Complaint  Patient presents with   Hypertension    HPI Joshua [name] is a 75 year old male here today for follow-up visit.  Reports overall he is doing well.  He continues to see cardiology for management of atrial fibrillation.  He does see psychiatry through the [place] and reports that his Lamictal was recently adjusted.  He has been able to cut back quite a bit on his alcohol consumption since adjustment of Lamictal.  He does also see urology for history of kidney stones.  He remains on Flomax.  His blood pressures remain well controlled.  He denies chest pain, shortness of breath, palpitations, headaches or vision changes.  ROS:  A comprehensive ROS was completed and negative except as noted per HPI  Allergies  Allergen Reactions   Venlafaxine Palpitations   Bupropion Other (See Comments)    unknown   Zoloft [Sertraline Hcl] Other (See Comments)    Tick    Past Medical History:  Diagnosis Date   Abnormal findings on diagnostic imaging of lung 09/23/2019   09/22/2019-lung cancer screening-centrilobular and paraseptal emphysema, calcified granulomata noted bilaterally, no suspicious nodules, lung RADS 1, repeat in 12 months    Atrial fibrillation ([place]) 09/23/2019   Centrilobular emphysema ([place]) 09/23/2019   09/22/2019-lung cancer screening-centrilobular and paraseptal emphysema, calcified granulomata noted bilaterally, no suspicious nodules, lung RADS 1, repeat in 12 months  09/23/2019-FVC 3.74 (81% predicted), postbronchodilator ratio 80, postbronchodilator FEV1 2.88 (86% predicted), no bronchodilator response, DLCO 19.44 (73% predicted)   DDD (degenerative disc disease), cervical 05/13/2008   Cervical Disc Degeneration   History of adenomatous polyp of colon 03/03/2018   05/14/17: Colonoscopy: nonadvanced adenoma, f/u 5 yrs, use SuPrep, [place]/GAP  Last Assessment & Plan:  Relevant Hx:  Course: Daily Update: Today's Plan:   Hyperlipidemia    Kidney stone    Nephrolithiasis 05/22/2011   Nontraumatic incomplete tear of right rotator cuff 10/14/2018   Other and unspecified hyperlipidemia 11/20/2009   Hyperlipidemia   RBBB 06/09/2012   Secondary hypercoagulable state ([place]) 09/28/2019   Shortness of breath 09/23/2019    Past Surgical History:  Procedure Laterality Date   CARDIOVERSION N/A 10/15/2019   Procedure: CARDIOVERSION;  Surgeon: Jerline Pain, MD;  Location: [facility] ENDOSCOPY;  Service: Cardiovascular;  Laterality: N/A;   CARDIOVERSION N/A 11/29/2019   Procedure: CARDIOVERSION;  Surgeon: Dorothy Spark, MD;  Location: [facility] ENDOSCOPY;  Service: Cardiovascular;  Laterality: N/A;   HEMORRHOID SURGERY      Social History   Socioeconomic History   Marital status: Married    Spouse name: Not on file   Number of children: Not on file   Years of education: Not on file   Highest education level: Not on file  Occupational History   Not on file  Tobacco Use   Smoking status: Former    Packs/day: 2.00    Years: 46.00    Pack years: 92.00    Types: Cigarettes    Quit date: 2010    Years since quitting: 12.8   Smokeless tobacco: Never  Vaping Use   Vaping Use: Never used  Substance and Sexual Activity   Alcohol use: Yes    Alcohol/week: 14.0 standard drinks    Types: 14 Cans of beer per week   Drug use: No   Sexual activity: Yes    Partners: Female  Other Topics Concern   Not on file  Social History Narrative   Not on file   Social Determinants of Health   Financial Resource Strain: Not on file  Food Insecurity: Not on file  Transportation Needs: Not on file  Physical Activity: Not on file  Stress: Not on file  Social Connections: Not on file    Family History  Problem Relation Age of Onset   Varicose Veins Mother    Stroke Father     Health Maintenance  Topic Date Due   Hepatitis C Screening  Never done   COVID-19 Vaccine (5 - Booster for  Pfizer series) 10/15/2021   TETANUS/TDAP  01/31/2026   COLONOSCOPY (Pts 45-24yrs Insurance coverage will need to be confirmed)  05/15/2027   Pneumonia Vaccine 79+ Years old  Completed   INFLUENZA VACCINE  Completed   Zoster Vaccines- Shingrix  Completed   HPV VACCINES  Aged Out     ----------------------------------------------------------------------------------------------------------------------------------------------------------------------------------------------------------------- Physical Exam BP 107/70 (BP Location: Left Arm, Patient Position: Sitting, Cuff Size: Large)   Pulse 60   Temp (!) 97.5 F (36.4 C)   Ht 5\' 11"  (1.803 m)   Wt 225 lb (102.1 kg)   SpO2 96%   BMI 31.38 kg/m   Physical Exam Constitutional:      Appearance: Normal appearance.  HENT:     Head: Normocephalic and atraumatic.  Eyes:     General: No scleral icterus. Cardiovascular:     Rate and Rhythm: Normal rate and regular rhythm.  Pulmonary:     Effort: Pulmonary effort is normal.     Breath sounds: Normal breath sounds.  Musculoskeletal:     Cervical back: Neck supple.  Neurological:     General: No focal deficit present.     Mental Status: He is alert.  Psychiatric:        Mood and Affect: Mood normal.        Behavior: Behavior normal.    ------------------------------------------------------------------------------------------------------------------------------------------------------------------------------------------------------------------- Assessment and Plan  Atrial fibrillation (H[facility]) Regular rate and rhythm on exam today.  No symptoms at this time.  He will continue management per cardiology.  Updated labs with her today.  Essential hypertension Blood pressure remains well controlled at this time.  He will continue current medications for management of hypertension.   Nephrolithiasis History of kidney stones.  He remains on Flomax.  He will continue management per  urology.  Secondary hypercoagulable state ([place]) He is doing well with eliquis.  Continue at current strength for anticoagulation related to his A. fib.  Mixed hyperlipidemia Update lipid panel  Major depressive disorder This is managed through psychiatry at the [place].  He will continue on current medications.  Senile purpura ([place]) Once again reassured.  Update CBC today.  Aortic atherosclerosis ([place]) He has quit smoking.  He will continue management of lifestyle risk factors.   No orders of the defined types were placed in this encounter.   Return in about 1 year (around 09/10/2022) for Annual visit.    This visit occurred during the SARS-CoV-2 public health emergency.  Safety protocols were in place, including screening questions prior to the visit, additional usage of staff PPE, and extensive cleaning of exam room while observing appropriate contact time as indicated for disinfecting solutions.

## 2021-09-10 NOTE — Assessment & Plan Note (Signed)
He has quit smoking.  He will continue management of lifestyle risk factors.

## 2021-09-11 LAB — COMPLETE METABOLIC PANEL WITH GFR
AG Ratio: 1.7 (calc) (ref 1.0–2.5)
ALT: 29 U/L (ref 9–46)
AST: 32 U/L (ref 10–35)
Albumin: 4.3 g/dL (ref 3.6–5.1)
Alkaline phosphatase (APISO): 94 U/L (ref 35–144)
BUN/Creatinine Ratio: 13 (calc) (ref 6–22)
BUN: 19 mg/dL (ref 7–25)
CO2: 29 mmol/L (ref 20–32)
Calcium: 9.6 mg/dL (ref 8.6–10.3)
Chloride: 105 mmol/L (ref 98–110)
Creat: 1.41 mg/dL — ABNORMAL HIGH (ref 0.70–1.28)
Globulin: 2.6 g/dL (calc) (ref 1.9–3.7)
Glucose, Bld: 109 mg/dL — ABNORMAL HIGH (ref 65–99)
Potassium: 4.6 mmol/L (ref 3.5–5.3)
Sodium: 142 mmol/L (ref 135–146)
Total Bilirubin: 0.8 mg/dL (ref 0.2–1.2)
Total Protein: 6.9 g/dL (ref 6.1–8.1)
eGFR: 53 mL/min/{1.73_m2} — ABNORMAL LOW (ref >=60)

## 2021-09-11 LAB — CBC WITH DIFFERENTIAL/PLATELET
Absolute Monocytes: 540 cells/uL (ref 200–950)
Basophils Absolute: 30 cells/uL (ref 0–200)
Basophils Relative: 0.8 %
Eosinophils Absolute: 70 cells/uL (ref 15–500)
Eosinophils Relative: 1.9 %
HCT: 40.1 % (ref 38.5–50.0)
Hemoglobin: 13.6 g/dL (ref 13.2–17.1)
Lymphs Abs: 799 cells/uL — ABNORMAL LOW (ref 850–3900)
MCH: 34.2 pg — ABNORMAL HIGH (ref 27.0–33.0)
MCHC: 33.9 g/dL (ref 32.0–36.0)
MCV: 100.8 fL — ABNORMAL HIGH (ref 80.0–100.0)
MPV: 11.3 fL (ref 7.5–12.5)
Monocytes Relative: 14.6 %
Neutro Abs: 2261 cells/uL (ref 1500–7800)
Neutrophils Relative %: 61.1 %
Platelets: 241 10*3/uL (ref 140–400)
RBC: 3.98 10*6/uL — ABNORMAL LOW (ref 4.20–5.80)
RDW: 11.9 % (ref 11.0–15.0)
Total Lymphocyte: 21.6 %
WBC: 3.7 10*3/uL — ABNORMAL LOW (ref 3.8–10.8)

## 2021-09-11 LAB — LIPID PANEL W/REFLEX DIRECT LDL
Cholesterol: 141 mg/dL (ref <200)
HDL: 62 mg/dL (ref >=40)
LDL Cholesterol (Calc): 64 mg/dL (calc)
Non-HDL Cholesterol (Calc): 79 mg/dL (calc) (ref <130)
Total CHOL/HDL Ratio: 2.3 (calc) (ref <5.0)
Triglycerides: 66 mg/dL (ref <150)

## 2021-09-11 LAB — TSH: TSH: 1.43 mIU/L (ref 0.40–4.50)

## 2021-09-12 ENCOUNTER — Telehealth: Payer: Self-pay

## 2021-09-12 NOTE — Telephone Encounter (Signed)
Pt called requesting lab result information especially pertaining to liver function.

## 2021-09-12 NOTE — Telephone Encounter (Signed)
Please see result note.  Thanks!  CM

## 2021-09-12 NOTE — Telephone Encounter (Signed)
LVM advising patient of lab result information.   Dr. Zigmund Daniel is aware Mr. Lotz was concerned about his creatnine being elevated. He will send a message to Mr. Marchetta.

## 2021-10-02 ENCOUNTER — Ambulatory Visit: Payer: Medicare HMO | Admitting: Neurology

## 2021-10-02 ENCOUNTER — Encounter: Payer: Self-pay | Admitting: Neurology

## 2021-10-02 VITALS — BP 132/79 | HR 52 | Ht 71.0 in | Wt 227.5 lb

## 2021-10-02 DIAGNOSIS — G250 Essential tremor: Secondary | ICD-10-CM | POA: Diagnosis not present

## 2021-10-02 NOTE — Progress Notes (Signed)
HISTORY OF PRESENT ILLNESS:  Joshua [name] is a 75 year old male, seen in request by his primary care physician Dr. [name] for evaluation of tremor, initial evaluation was on January 21, 2020.   I have reviewed and summarized the referring note from the referring physician and multiple hospital chart, emergency presentation on September 22, 2020, he presented for shortness of breath, CT chest without evidence of PE, was found to be atrial fibrillation, was started on anticoagulation,  He had DC cardioversion by Dr. Meda Coffee on November 29, 2019, that was successful.  He still anticoagulation Eliquis 5 mg twice a day, amiodarone, he also has history of hyperlipidemia, anger issues, is under the care of psychiatrist at the [facility], was started on Depakote 750 mg at bedtime in 2020 along with Prozac 20 mg, hydroxyzine 10 mg, few months later, around November 2020, he began to notice bilateral hands tremor, involving both hands, right hand seems to be more obvious than the left hand, he is right-handed, now he eats with his left hand, sometimes the shaking was so bad, half of his bones was spilled out, especially when holding on small granulated food, such as rice, peas.   He denies gait abnormality, denied loss sense of smell, no constipation, orthostatic dizziness, he does have vivid dreams, wife reported excessive movement during sleep, his wife also send a note concerning about his gradual increased alcohol intake, patient reported that he can take up to 6 to 8 ounce of hard liquor in certain days, but can go on days without alcohol consumption without craving, he also drinks about 10 ounces of caffeine, he denies family history of tremor   He was started on primidone since January 03, 2019, titrating dose now to 50 mg 5 tablets every night, he noticed mild improvement, does complains of drowsiness.   Laboratory evaluations since 2020, normal TSH, CMP abnormal kidney function, creatinine of  1.39,, CBC showed decreased WBC, with elevated MCV, elevated free T4 1.24, lipid profile showed LDL 73,  Update October 02, 2021: He stopped the Depakote, remained on lamotrigine 100 mg twice daily for his mood disorder, since then his tremor has much improved  Personally reviewed MRI of the brain without contrast April 2021, generalized atrophy, mild supratentorium small vessel disease  TSH was normal  He denies a family history of tremor, denies gait abnormality,  REVIEW OF SYSTEMS: Out of a complete 14 system review of symptoms, the patient complains only of the following symptoms, and all other reviewed systems are negative.  Tremor  ALLERGIES: Allergies  Allergen Reactions   Venlafaxine Palpitations   Bupropion Other (See Comments)    unknown   Zoloft [Sertraline Hcl] Other (See Comments)    Tick    HOME MEDICATIONS: Outpatient Medications Prior to Visit  Medication Sig Dispense Refill   acetaminophen (TYLENOL) 500 MG tablet Take 1,000 mg by mouth every 6 (six) hours as needed (for pain.).     amiodarone (PACERONE) 200 MG tablet Take 0.5 tablets (100 mg total) by mouth daily. 45 tablet 2   apixaban (ELIQUIS) 5 MG TABS tablet Take 5 mg by mouth 2 (two) times daily.     atorvastatin (LIPITOR) 40 MG tablet Take 1 tablet (40 mg total) by mouth daily. 90 tablet 3   Cholecalciferol 25 MCG (1000 UT) tablet Take 1,000 Units by mouth daily.     clobetasol (TEMOVATE) 0.05 % external solution Apply 1 application topically. Uses Monday and Thursday  FLUoxetine (PROZAC) 20 MG capsule Take 20 mg by mouth daily.      lamoTRIgine (LAMICTAL) 100 MG tablet Take 100 mg by mouth 2 (two) times daily.     lamoTRIgine (LAMICTAL) 200 MG tablet TAKE ONE-HALF TABLET BY MOUTH TWICE A DAY TAKING IN THE MORNING AND IN THE AFTERNOON     methocarbamol (ROBAXIN) 500 MG tablet Take 500 mg by mouth as needed for muscle spasms.     tamsulosin (FLOMAX) 0.4 MG CAPS capsule Take 0.4 mg by mouth 3 (three)  times a week.     No facility-administered medications prior to visit.    PAST MEDICAL HISTORY: Past Medical History:  Diagnosis Date   Abnormal findings on diagnostic imaging of lung 09/23/2019   09/22/2019-lung cancer screening-centrilobular and paraseptal emphysema, calcified granulomata noted bilaterally, no suspicious nodules, lung RADS 1, repeat in 12 months    Atrial fibrillation ([place]) 09/23/2019   Centrilobular emphysema ([place]) 09/23/2019   09/22/2019-lung cancer screening-centrilobular and paraseptal emphysema, calcified granulomata noted bilaterally, no suspicious nodules, lung RADS 1, repeat in 12 months  09/23/2019-FVC 3.74 (81% predicted), postbronchodilator ratio 80, postbronchodilator FEV1 2.88 (86% predicted), no bronchodilator response, DLCO 19.44 (73% predicted)   DDD (degenerative disc disease), cervical 05/13/2008   Cervical Disc Degeneration   History of adenomatous polyp of colon 03/03/2018   05/14/17: Colonoscopy: nonadvanced adenoma, f/u 5 yrs, use SuPrep, Murphy/GAP  Last Assessment & Plan:  Relevant Hx: Course: Daily Update: Today's Plan:   Hyperlipidemia    Kidney stone    Nephrolithiasis 05/22/2011   Nontraumatic incomplete tear of right rotator cuff 10/14/2018   Other and unspecified hyperlipidemia 11/20/2009   Hyperlipidemia   RBBB 06/09/2012   Secondary hypercoagulable state ([place]) 09/28/2019   Shortness of breath 09/23/2019    PAST SURGICAL HISTORY: Past Surgical History:  Procedure Laterality Date   CARDIOVERSION N/A 10/15/2019   Procedure: CARDIOVERSION;  Surgeon: Jerline Pain, MD;  Location: [facility] ENDOSCOPY;  Service: Cardiovascular;  Laterality: N/A;   CARDIOVERSION N/A 11/29/2019   Procedure: CARDIOVERSION;  Surgeon: Dorothy Spark, MD;  Location: [facility] ENDOSCOPY;  Service: Cardiovascular;  Laterality: N/A;   HEMORRHOID SURGERY      FAMILY HISTORY: Family History  Problem Relation Age of Onset   Varicose Veins Mother    Stroke Father     SOCIAL  HISTORY: Social History   Socioeconomic History   Marital status: Married    Spouse name: Not on file   Number of children: Not on file   Years of education: Not on file   Highest education level: Not on file  Occupational History   Not on file  Tobacco Use   Smoking status: Former    Packs/day: 2.00    Years: 46.00    Pack years: 92.00    Types: Cigarettes    Quit date: 2010    Years since quitting: 12.9   Smokeless tobacco: Never  Vaping Use   Vaping Use: Never used  Substance and Sexual Activity   Alcohol use: Yes    Alcohol/week: 14.0 standard drinks    Types: 14 Cans of beer per week   Drug use: No   Sexual activity: Yes    Partners: Female  Other Topics Concern   Not on file  Social History Narrative   Not on file   Social Determinants of Health   Financial Resource Strain: Not on file  Food Insecurity: Not on file  Transportation Needs: Not on file  Physical Activity: Not on file  Stress: Not on file  Social Connections: Not on file  Intimate Partner Violence: Not on file   PHYSICAL EXAM  Vitals:   10/02/21 0730  BP: 132/79  Pulse: (!) 52  Weight: 227 lb 8 oz (103.2 kg)  Height: 5\' 11"  (1.803 m)   Body mass index is 31.73 kg/m.    PHYSICAL EXAMNIATION:  Gen: NAD, conversant, well nourised, well groomed                     Cardiovascular: Regular rate rhythm, no peripheral edema, warm, nontender. Eyes: Conjunctivae clear without exudates or hemorrhage Neck: Supple, no carotid bruits. Pulmonary: Clear to auscultation bilaterally   NEUROLOGICAL EXAM:  MENTAL STATUS: Speech/Cognition: Awake, alert, normal speech, oriented to history taking and casual conversation.  CRANIAL NERVES: CN II: Visual fields are full to confrontation.  Pupils are round equal and briskly reactive to light. CN [name], IV, VI: extraocular movement are normal. No ptosis. CN V: Facial sensation is intact to light touch. CN VII: Face is symmetric with normal eye closure  and smile. CN VIII: Hearing is normal to casual conversation CN IX, X: Palate elevates symmetrically. Phonation is normal. CN XI: Head turning and shoulder shrug are intact CN XII: Tongue is midline with normal movements and no atrophy.  MOTOR: Muscle strength is normal.  There is no bradykinesia, no rigidity, only slight posturing tremor,  REFLEXES: Reflexes are 2  and symmetric at the biceps, triceps, knees and ankles. Plantar responses are flexor.  SENSORY: Intact to light touch, pinprick, positional and vibratory sensation at fingers and toes.  COORDINATION: There is no trunk or limb ataxia.    GAIT/STANCE: Posture is normal.  He can get up from seated position arm crossed, gait is steady with normal steps, base, arm swing and turning.    DIAGNOSTIC DATA (LABS, IMAGING, TESTING) - I reviewed patient records, labs, notes, testing and imaging myself where available.  Lab Results  Component Value Date   WBC 3.7 (L) 09/10/2021   HGB 13.6 09/10/2021   HCT 40.1 09/10/2021   MCV 100.8 (H) 09/10/2021   PLT 241 09/10/2021      Component Value Date/Time   NA 142 09/10/2021 0000   NA 144 10/26/2020 0949   K 4.6 09/10/2021 0000   CL 105 09/10/2021 0000   CO2 29 09/10/2021 0000   GLUCOSE 109 (H) 09/10/2021 0000   BUN 19 09/10/2021 0000   BUN 20 10/26/2020 0949   CREATININE 1.41 (H) 09/10/2021 0000   CALCIUM 9.6 09/10/2021 0000   PROT 6.9 09/10/2021 0000   ALBUMIN 3.8 01/15/2021 0852   AST 32 09/10/2021 0000   ALT 29 09/10/2021 0000   ALKPHOS 74 01/15/2021 0852   BILITOT 0.8 09/10/2021 0000   GFRNONAA 58 (L) 03/08/2021 0836   GFRAA 68 03/08/2021 0836   Lab Results  Component Value Date   CHOL 141 09/10/2021   HDL 62 09/10/2021   LDLCALC 64 09/10/2021   TRIG 66 09/10/2021   CHOLHDL 2.3 09/10/2021   No results found for: HGBA1C Lab Results  Component Value Date   VITAMINB12 595 01/21/2020   Lab Results  Component Value Date   TSH 1.43 09/10/2021   ASSESSMENT  AND PLAN 75 y.o. year old male    Bilateral hand tremor  Action tremor, no parkinsonian features,  Much improved after stopping Depakote, likely due to side effect from Depakote,  Normal thyroid functional test, no family history of tremor,  MRI of the brain  showed age-related changes, generalized atrophy, mild supratentorium small vessel disease,  Will discharge from neurology clinic, continue follow-up with primary care and psychiatrist  Marcial Pacas, M.D. Ph.D.  [facility] [place], [place] [address] Phone: [phone] Fax:      [phone]

## 2021-10-05 ENCOUNTER — Other Ambulatory Visit: Payer: Self-pay | Admitting: Cardiology

## 2021-10-25 ENCOUNTER — Telehealth: Payer: Self-pay | Admitting: Neurology

## 2021-10-25 NOTE — Telephone Encounter (Signed)
Patient left vm stating he is still having trouble with his right hip. The pain is now affecting his sleep. His wife takes Tizanidine and he tried one last night which helped. He is asking if he can have a RX for medication?

## 2021-10-25 NOTE — Telephone Encounter (Signed)
Left a message advising patient to call back to schedule an appointment.

## 2021-10-25 NOTE — Telephone Encounter (Signed)
Patient scheduled.

## 2021-10-26 ENCOUNTER — Ambulatory Visit (INDEPENDENT_AMBULATORY_CARE_PROVIDER_SITE_OTHER): Payer: Medicare HMO

## 2021-10-26 ENCOUNTER — Other Ambulatory Visit: Payer: Self-pay

## 2021-10-26 ENCOUNTER — Ambulatory Visit (INDEPENDENT_AMBULATORY_CARE_PROVIDER_SITE_OTHER): Payer: Medicare HMO | Admitting: Sports Medicine

## 2021-10-26 DIAGNOSIS — M1611 Unilateral primary osteoarthritis, right hip: Secondary | ICD-10-CM

## 2021-10-26 DIAGNOSIS — M25551 Pain in right hip: Secondary | ICD-10-CM | POA: Diagnosis not present

## 2021-10-26 MED ORDER — CYCLOBENZAPRINE HCL 10 MG PO TABS: ORAL_TABLET | ORAL | 11 refills | Status: DC

## 2021-10-26 NOTE — Progress Notes (Signed)
° ° °  Procedures performed today:    Procedure: Real-time Ultrasound Guided injection of the right hip joint Device: Samsung HS60  Verbal informed consent obtained.  Time-out conducted.  Noted no overlying erythema, induration, or other signs of local infection.  Skin prepped in a sterile fashion.  Local anesthesia: Topical Ethyl chloride.  With sterile technique and under real time ultrasound guidance: Noted arthritic joint, 1 cc Kenalog 40, 2 cc lidocaine, 2 cc bupivacaine injected easily Completed without difficulty  Advised to call if fevers/chills, erythema, induration, drainage, or persistent bleeding.  Images permanently stored and available for review in PACS.  Impression: Technically successful ultrasound guided injection.  Independent interpretation of notes and tests performed by another provider:   None.  Brief History, Exam, Impression, and Recommendations:    Primary osteoarthritis of right hip This is a pleasant 75 year old male, he has long history of right hip and groin pain, moderate gelling. No trauma. He has tried some over-the-counter medications without much improvement, he did try some of his wife's Flexeril which seemed to help him get some sleep. On exam he has very minimal internal rotation of the right hip with a good amount of pain. Adding x-rays, hip joint injection today, unable to use NSAIDs, he is on Eliquis, Flexeril at night, return to see me in 6 weeks.    ___________________________________________ Gwen Her. Dianah Field, M.D., ABFM., CAQSM. Primary Care and [place] Instructor of [place] [employer] [facility] [employer]

## 2021-10-26 NOTE — Assessment & Plan Note (Signed)
This is a pleasant 75 year old male, he has long history of right hip and groin pain, moderate gelling. No trauma. He has tried some over-the-counter medications without much improvement, he did try some of his wife's Flexeril which seemed to help him get some sleep. On exam he has very minimal internal rotation of the right hip with a good amount of pain. Adding x-rays, hip joint injection today, unable to use NSAIDs, he is on Eliquis, Flexeril at night, return to see me in 6 weeks.

## 2021-11-06 ENCOUNTER — Telehealth: Payer: Self-pay

## 2021-11-06 DIAGNOSIS — M1611 Unilateral primary osteoarthritis, right hip: Secondary | ICD-10-CM

## 2021-11-06 MED ORDER — TRAMADOL HCL 50 MG PO TABS: 50.0000 mg | ORAL_TABLET | Freq: Three times a day (TID) | ORAL | 0 refills | Status: DC | PRN

## 2021-11-06 NOTE — Telephone Encounter (Signed)
Patient called in. He had right hip injection on  12/23. He states he is having increased pain that comes and goes and wants to know what he should do

## 2021-11-06 NOTE — Telephone Encounter (Signed)
Called to let patient know. No answer. Left voicemail for return call

## 2021-11-06 NOTE — Telephone Encounter (Signed)
Joshua [name] called and states the first few days after the hip injection the pain was resolved. Now the pain in his hip comes and goes. He would like to know the next steps. Please advise.

## 2021-11-06 NOTE — Telephone Encounter (Signed)
I am going to add some tramadol, lets give it a week of tramadol and if still having significant discomfort that he is unable to live with then that will necessitate a referral to the surgeon for hip replacement.

## 2021-11-07 NOTE — Telephone Encounter (Signed)
Left msg for a reeturn call from patient.

## 2021-11-07 NOTE — Telephone Encounter (Signed)
Patient aware of Dr. T recommendations and verbalized understanding.  

## 2021-11-08 DIAGNOSIS — R9082 White matter disease, unspecified: Secondary | ICD-10-CM | POA: Diagnosis not present

## 2021-11-08 DIAGNOSIS — R296 Repeated falls: Secondary | ICD-10-CM | POA: Insufficient documentation

## 2021-11-08 DIAGNOSIS — F319 Bipolar disorder, unspecified: Secondary | ICD-10-CM | POA: Insufficient documentation

## 2021-11-08 DIAGNOSIS — I6523 Occlusion and stenosis of bilateral carotid arteries: Secondary | ICD-10-CM | POA: Diagnosis not present

## 2021-11-08 DIAGNOSIS — F10931 Alcohol use, unspecified with withdrawal delirium: Secondary | ICD-10-CM | POA: Diagnosis not present

## 2021-11-08 DIAGNOSIS — N1831 Chronic kidney disease, stage 3a: Secondary | ICD-10-CM | POA: Diagnosis not present

## 2021-11-08 DIAGNOSIS — F419 Anxiety disorder, unspecified: Secondary | ICD-10-CM | POA: Insufficient documentation

## 2021-11-08 DIAGNOSIS — R4701 Aphasia: Secondary | ICD-10-CM | POA: Insufficient documentation

## 2021-11-08 DIAGNOSIS — G319 Degenerative disease of nervous system, unspecified: Secondary | ICD-10-CM | POA: Diagnosis not present

## 2021-11-08 DIAGNOSIS — I480 Paroxysmal atrial fibrillation: Secondary | ICD-10-CM | POA: Diagnosis not present

## 2021-11-08 DIAGNOSIS — R9431 Abnormal electrocardiogram [ECG] [EKG]: Secondary | ICD-10-CM | POA: Insufficient documentation

## 2021-11-08 DIAGNOSIS — F10131 Alcohol abuse with withdrawal delirium: Secondary | ICD-10-CM | POA: Diagnosis not present

## 2021-11-08 DIAGNOSIS — N183 Chronic kidney disease, stage 3 unspecified: Secondary | ICD-10-CM | POA: Insufficient documentation

## 2021-11-08 DIAGNOSIS — R531 Weakness: Secondary | ICD-10-CM | POA: Diagnosis not present

## 2021-11-08 DIAGNOSIS — I517 Cardiomegaly: Secondary | ICD-10-CM | POA: Diagnosis not present

## 2021-11-08 DIAGNOSIS — R059 Cough, unspecified: Secondary | ICD-10-CM | POA: Diagnosis not present

## 2021-11-08 DIAGNOSIS — R4781 Slurred speech: Secondary | ICD-10-CM | POA: Diagnosis not present

## 2021-11-08 DIAGNOSIS — E785 Hyperlipidemia, unspecified: Secondary | ICD-10-CM | POA: Diagnosis not present

## 2021-11-08 DIAGNOSIS — I6782 Cerebral ischemia: Secondary | ICD-10-CM | POA: Diagnosis not present

## 2021-11-08 DIAGNOSIS — R569 Unspecified convulsions: Secondary | ICD-10-CM | POA: Diagnosis not present

## 2021-11-08 DIAGNOSIS — Z20822 Contact with and (suspected) exposure to covid-19: Secondary | ICD-10-CM | POA: Diagnosis not present

## 2021-11-08 DIAGNOSIS — Z789 Other specified health status: Secondary | ICD-10-CM | POA: Diagnosis not present

## 2021-11-08 DIAGNOSIS — H359 Unspecified retinal disorder: Secondary | ICD-10-CM | POA: Diagnosis not present

## 2021-11-08 DIAGNOSIS — I4891 Unspecified atrial fibrillation: Secondary | ICD-10-CM | POA: Diagnosis not present

## 2021-11-08 DIAGNOSIS — R29818 Other symptoms and signs involving the nervous system: Secondary | ICD-10-CM | POA: Diagnosis not present

## 2021-11-09 DIAGNOSIS — I4891 Unspecified atrial fibrillation: Secondary | ICD-10-CM | POA: Diagnosis not present

## 2021-11-09 DIAGNOSIS — R9431 Abnormal electrocardiogram [ECG] [EKG]: Secondary | ICD-10-CM | POA: Diagnosis not present

## 2021-11-09 DIAGNOSIS — R4701 Aphasia: Secondary | ICD-10-CM | POA: Diagnosis not present

## 2021-11-09 DIAGNOSIS — F10931 Alcohol use, unspecified with withdrawal delirium: Secondary | ICD-10-CM | POA: Diagnosis not present

## 2021-11-09 DIAGNOSIS — E785 Hyperlipidemia, unspecified: Secondary | ICD-10-CM | POA: Diagnosis not present

## 2021-11-09 DIAGNOSIS — I082 Rheumatic disorders of both aortic and tricuspid valves: Secondary | ICD-10-CM | POA: Diagnosis not present

## 2021-11-09 DIAGNOSIS — N1831 Chronic kidney disease, stage 3a: Secondary | ICD-10-CM | POA: Diagnosis not present

## 2021-11-09 DIAGNOSIS — F419 Anxiety disorder, unspecified: Secondary | ICD-10-CM | POA: Diagnosis not present

## 2021-11-09 DIAGNOSIS — R296 Repeated falls: Secondary | ICD-10-CM | POA: Diagnosis not present

## 2021-11-09 DIAGNOSIS — F319 Bipolar disorder, unspecified: Secondary | ICD-10-CM | POA: Diagnosis not present

## 2021-11-14 ENCOUNTER — Emergency Department
Admission: RE | Admit: 2021-11-14 | Discharge: 2021-11-14 | Disposition: A | Discharge status: Home / Self Care | Payer: Medicare HMO | Source: Ambulatory Visit

## 2021-11-14 ENCOUNTER — Other Ambulatory Visit: Payer: Self-pay

## 2021-11-14 VITALS — BP 115/78 | HR 74 | Temp 98.3°F | Resp 18 | Ht 70.0 in | Wt 214.0 lb

## 2021-11-14 DIAGNOSIS — J40 Bronchitis, not specified as acute or chronic: Secondary | ICD-10-CM

## 2021-11-14 DIAGNOSIS — R062 Wheezing: Secondary | ICD-10-CM

## 2021-11-14 DIAGNOSIS — J441 Chronic obstructive pulmonary disease with (acute) exacerbation: Secondary | ICD-10-CM | POA: Diagnosis not present

## 2021-11-14 MED ORDER — AEROCHAMBER PLUS FLO-VU MEDIUM MISC
1.0000 | Freq: Once | Status: AC
  Administered 2021-11-14: 1

## 2021-11-14 MED ORDER — AMOXICILLIN-POT CLAVULANATE 875-125 MG PO TABS: 1.0000 | ORAL_TABLET | Freq: Two times a day (BID) | ORAL | 0 refills | Status: DC

## 2021-11-14 MED ORDER — AZITHROMYCIN 250 MG PO TABS: 250.0000 mg | ORAL_TABLET | Freq: Every day | ORAL | 0 refills | Status: DC

## 2021-11-14 MED ORDER — PREDNISONE 20 MG PO TABS: 20.0000 mg | ORAL_TABLET | Freq: Two times a day (BID) | ORAL | 0 refills | Status: DC

## 2021-11-14 MED ORDER — ALBUTEROL SULFATE HFA 108 (90 BASE) MCG/ACT IN AERS
2.0000 | INHALATION_SPRAY | Freq: Once | RESPIRATORY_TRACT | Status: AC
  Administered 2021-11-14: 2 via RESPIRATORY_TRACT

## 2021-11-14 NOTE — ED Triage Notes (Signed)
Pt states that he has a cough, chest congestion, and rattling of lungs. X1 week  Pt states that he is vaccinated for covid. Pt states that he has had flu vaccine.

## 2021-11-14 NOTE — Discharge Instructions (Signed)
Take antibiotic Augmentin 2 times a day with food.  Take a probiotic while on the antibiotic so it does not upset your stomach.  (I initially prescribed a azithromycin, you cannot take this with amiodarone) Take prednisone 2 times a day for 5 days Take Mucinex DM twice a day for cough and congestion Use the albuterol inhaler every 4-6 hours as needed for shortness of breath See your doctor if not improving by next week

## 2021-11-14 NOTE — ED Provider Notes (Signed)
Joshua [name] CARE    CSN: [id number] Arrival date & time: 11/14/21  1831      History   Chief Complaint Chief Complaint  Patient presents with   Cough    Cough, chest congestion and rattling of lungs. X1 week    HPI Joshua [name] is a 76 y.o. male.   HPI Patient's had a little over a week of illness.  Cough and congestion.  Shortness of breath.  "Rattling" of lungs.  States he does not have any known underlying lung disease but has been told he had emphysema.  Did have a long history of cigarette smoking.  Had a CT to rule out lung cancer which documented the emphysematous changes.  Currently has had some sweats and chills but has not had fever Joshua [name] is being treated for similar illness. COVID testing at home is negative  Past Medical History:  Diagnosis Date   Abnormal findings on diagnostic imaging of lung 09/23/2019   09/22/2019-lung cancer screening-centrilobular and paraseptal emphysema, calcified granulomata noted bilaterally, no suspicious nodules, lung RADS 1, repeat in 12 months    Atrial fibrillation ([place]) 09/23/2019   Centrilobular emphysema ([place]) 09/23/2019   09/22/2019-lung cancer screening-centrilobular and paraseptal emphysema, calcified granulomata noted bilaterally, no suspicious nodules, lung RADS 1, repeat in 12 months  09/23/2019-FVC 3.74 (81% predicted), postbronchodilator ratio 80, postbronchodilator FEV1 2.88 (86% predicted), no bronchodilator response, DLCO 19.44 (73% predicted)   DDD (degenerative disc disease), cervical 05/13/2008   Cervical Disc Degeneration   History of adenomatous polyp of colon 03/03/2018   05/14/17: Colonoscopy: nonadvanced adenoma, f/u 5 yrs, use SuPrep, [place]/GAP  Last Assessment & Plan:  Relevant Hx: Course: Daily Update: Today's Plan:   Hyperlipidemia    Kidney stone    Nephrolithiasis 05/22/2011   Nontraumatic incomplete tear of right rotator cuff 10/14/2018   Other and unspecified hyperlipidemia 11/20/2009    Hyperlipidemia   RBBB 06/09/2012   Secondary hypercoagulable state ([place]) 09/28/2019   Shortness of breath 09/23/2019    Patient Active Problem List   Diagnosis Date Noted   Primary osteoarthritis of right hip 10/26/2021   Abnormal liver function tests 09/10/2021   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   Urinary hesitancy 03/08/2021   Thoracic aortic aneurysm without rupture 03/08/2021   Kidney stone    Senile purpura ([place]) 09/07/2020   Aortic atherosclerosis ([place]) 09/07/2020   Sinus bradycardia 03/31/2020   Essential hypertension 03/31/2020   Major depressive disorder 03/07/2020   Essential tremor 03/07/2020   Tremor 01/21/2020   Obesity (BMI 30-39.9) 12/10/2019   Chest pain of uncertain etiology 93/79/0240   Secondary hypercoagulable state ([place]) 09/28/2019   Centrilobular emphysema ([place]) 09/23/2019   Former smoker 09/23/2019   Abnormal findings on diagnostic imaging of lung 09/23/2019   Atrial fibrillation ([place]) 09/23/2019   At risk for sleep apnea 09/23/2019   Shortness of breath 09/23/2019   Nontraumatic incomplete tear of right rotator cuff 10/14/2018   History of adenomatous polyp of colon 03/03/2018   RBBB 06/09/2012   Nephrolithiasis 05/22/2011   Mixed hyperlipidemia 11/20/2009   DDD (degenerative disc disease), cervical 05/13/2008    Past Surgical History:  Procedure Laterality Date   CARDIOVERSION N/A 10/15/2019   Procedure: CARDIOVERSION;  Surgeon: Jerline Pain, MD;  Location: [facility];  Service: Cardiovascular;  Laterality: N/A;   CARDIOVERSION N/A 11/29/2019   Procedure: CARDIOVERSION;  Surgeon: Dorothy Spark, MD;  Location: [facility];  Service: Cardiovascular;  Laterality: N/A;   HEMORRHOID SURGERY  Home Medications    Prior to Admission medications   Medication Sig Start Date End Date Taking? Authorizing Provider  acetaminophen (TYLENOL) 500 MG tablet Take 1,000 mg by mouth every 6 (six) hours as needed (for pain.).   Yes [provider]   amiodarone (PACERONE) 200 MG tablet Take 0.5 tablets (100 mg total) by mouth daily. 02/21/21  Yes Fenton, Clint R, PA  amoxicillin-clavulanate (AUGMENTIN) 875-125 MG tablet Take 1 tablet by mouth every 12 (twelve) hours. 11/14/21  Yes Raylene Everts, MD  apixaban (ELIQUIS) 5 MG TABS tablet Take 5 mg by mouth 2 (two) times daily.   Yes [provider]  atorvastatin (LIPITOR) 40 MG tablet TAKE ONE TABLET BY MOUTH DAILY 10/05/21  Yes Tobb, Kardie, DO  Cholecalciferol 25 MCG (1000 UT) tablet Take 1,000 Units by mouth daily.   Yes [provider]  clobetasol (TEMOVATE) 0.05 % external solution Apply 1 application topically. Uses Monday and Thursday 03/09/20  Yes [provider]  cyclobenzaprine (FLEXERIL) 10 MG tablet One half to one tab PO qHS (muscle relaxer for nighttime use) 10/26/21  Yes Silverio Decamp, MD  FLUoxetine (PROZAC) 20 MG capsule Take 20 mg by mouth daily.    Yes [provider]  lamoTRIgine (LAMICTAL) 200 MG tablet TAKE ONE-HALF TABLET BY MOUTH TWICE A DAY TAKING IN THE MORNING AND IN THE AFTERNOON 09/06/21  Yes [provider]  predniSONE (DELTASONE) 20 MG tablet Take 1 tablet (20 mg total) by mouth 2 (two) times daily with a meal. 11/14/21  Yes Raylene Everts, MD  tamsulosin (FLOMAX) 0.4 MG CAPS capsule Take 0.4 mg by mouth 3 (three) times a week.   Yes [provider]    Family History Family History  Problem Relation Age of Onset   Varicose Veins Mother    Stroke Father     Social History Social History   Tobacco Use   Smoking status: Former    Packs/day: 2.00    Years: 46.00    Pack years: 92.00    Types: Cigarettes    Quit date: 2010    Years since quitting: 13.0   Smokeless tobacco: Never  Vaping Use   Vaping Use: Never used  Substance Use Topics   Alcohol use: Yes    Alcohol/week: 14.0 standard drinks    Types: 14 Cans of beer per week   Drug use: No     Allergies   Venlafaxine, Bupropion,  Tramadol, and Zoloft [sertraline hcl]   Review of Systems Review of Systems See HPI  Physical Exam Triage Vital Signs ED Triage Vitals  Enc Vitals Group     BP 11/14/21 1856 115/78     Pulse Rate 11/14/21 1856 74     Resp 11/14/21 1856 18     Temp 11/14/21 1856 98.3 F (36.8 C)     Temp Source 11/14/21 1856 Oral     SpO2 11/14/21 1856 100 %     Weight 11/14/21 1854 214 lb (97.1 kg)     Height 11/14/21 1854 5\' 10"  (1.778 m)     Head Circumference --      Peak Flow --      Pain Score 11/14/21 1854 0     Pain Loc --      Pain Edu? --      Excl. in [place]? --    No data found.  Updated Vital Signs BP 115/78 (BP Location: Left Arm)    Pulse 74    Temp  98.3 F (36.8 C) (Oral)    Resp 18    Ht 5\' 10"  (1.778 m)    Wt 97.1 kg    SpO2 100%    BMI 30.71 kg/m      Physical Exam Constitutional:      General: He is not in acute distress.    Appearance: He is well-developed and normal weight.     Comments: Pleasant.  Some difficulty finding words  HENT:     Head: Normocephalic and atraumatic.     Right Ear: Tympanic membrane and ear canal normal.     Left Ear: Tympanic membrane normal.     Nose: Nose normal. No congestion.     Mouth/Throat:     Mouth: Mucous membranes are dry.     Pharynx: No posterior oropharyngeal erythema.  Eyes:     Conjunctiva/sclera: Conjunctivae normal.     Pupils: Pupils are equal, round, and reactive to light.  Cardiovascular:     Rate and Rhythm: Normal rate and regular rhythm.     Heart sounds: Normal heart sounds.  Pulmonary:     Effort: Pulmonary effort is normal. No respiratory distress.     Breath sounds: Wheezing and rhonchi present. No rales.  Abdominal:     General: There is no distension.     Palpations: Abdomen is soft.  Musculoskeletal:        General: Normal range of motion.     Cervical back: Normal range of motion.  Lymphadenopathy:     Cervical: Cervical adenopathy present.  Skin:    General: Skin is warm and dry.   Neurological:     Mental Status: He is alert.  Psychiatric:        Mood and Affect: Mood normal.        Behavior: Behavior normal.     UC Treatments / Results  Labs (all labs ordered are listed, but only abnormal results are displayed) Labs Reviewed - No data to display  EKG   Radiology No results found.  Procedures Procedures (including critical care time)  Medications Ordered in UC Medications  albuterol (VENTOLIN HFA) 108 (90 Base) MCG/ACT inhaler 2 puff (2 puffs Inhalation Given 11/14/21 1914)  AeroChamber Plus Flo-Vu Medium MISC 1 each (1 each Other Given 11/14/21 1915)    Initial Impression / Assessment and Plan / UC Course  I have reviewed the triage vital signs and the nursing notes.  Pertinent labs & imaging results that were available during my care of the patient were reviewed by me and considered in my medical decision making (see chart for details).     Patient was given albuterol.  After this her Gwenlyn Found listen to his lungs.  The wheezing was gone but the rhonchi persist.  No rales identified. Final Clinical Impressions(s) / UC Diagnoses   Final diagnoses:  COPD exacerbation ([place])  Wheeze  Bronchitis     Discharge Instructions      Take antibiotic Augmentin 2 times a day with food.  Take a probiotic while on the antibiotic so it does not upset your stomach.  (I initially prescribed a azithromycin, you cannot take this with amiodarone) Take prednisone 2 times a day for 5 days Take Mucinex DM twice a day for cough and congestion Use the albuterol inhaler every 4-6 hours as needed for shortness of breath See your doctor if not improving by next week   ED Prescriptions     Medication Sig Dispense Auth. Provider   predniSONE (DELTASONE) 20 MG tablet  Take 1 tablet (20 mg total) by mouth 2 (two) times daily with a meal. 10 tablet Raylene Everts, MD   azithromycin (ZITHROMAX) 250 MG tablet  (Status: Discontinued) Take 1 tablet (250 mg total) by mouth  daily. Take first 2 tablets together, then 1 every day until finished. 6 tablet Raylene Everts, MD   amoxicillin-clavulanate (AUGMENTIN) 875-125 MG tablet Take 1 tablet by mouth every 12 (twelve) hours. 14 tablet Raylene Everts, MD      PDMP not reviewed this encounter.   Raylene Everts, MD 11/14/21 [phone]

## 2021-11-17 ENCOUNTER — Other Ambulatory Visit (HOSPITAL_COMMUNITY): Payer: Self-pay | Admitting: Physician Assistant

## 2021-11-20 DIAGNOSIS — R4701 Aphasia: Secondary | ICD-10-CM | POA: Diagnosis not present

## 2021-11-20 DIAGNOSIS — Z789 Other specified health status: Secondary | ICD-10-CM | POA: Diagnosis not present

## 2021-11-20 DIAGNOSIS — R296 Repeated falls: Secondary | ICD-10-CM | POA: Diagnosis not present

## 2021-11-22 ENCOUNTER — Telehealth: Payer: Self-pay | Admitting: Family Medicine

## 2021-11-22 DIAGNOSIS — M1611 Unilateral primary osteoarthritis, right hip: Secondary | ICD-10-CM

## 2021-11-22 MED ORDER — HYDROCODONE-ACETAMINOPHEN 10-325 MG PO TABS: 1.0000 | ORAL_TABLET | Freq: Three times a day (TID) | ORAL | 0 refills | Status: DC | PRN

## 2021-11-22 NOTE — Telephone Encounter (Signed)
Called Pt and informed him that a RX was called in for him as well as the other info from Dr. Darene Lamer. He will go tomorrow for an xray and will wait on the call from the surgeon.

## 2021-11-22 NOTE — Telephone Encounter (Signed)
I will send in some hydrocodone, high-dose, I am also going to get him referred off to one of our surgeons, if he is in a lot of pain in spite of the injection at the last visit there is really nothing else I can do so he can probably just cancel the appointment for tomorrow.  I do see a visit with neurology for recurrent falls, because of this I would like him to at least get another x-ray.  He can do all of the stuff today or tomorrow and should expect a call from the surgeon.

## 2021-11-22 NOTE — Telephone Encounter (Signed)
Pt called he is in excruciating pain. Unable to put on his clothes, drive or move from the chair. He is scheduled for an appt tomorrow morning but is concerned about pain from now to tomorrow. He is asking for either an additional RX. Or if he can take an additional tablet of what has been prescribed.

## 2021-11-23 ENCOUNTER — Ambulatory Visit (INDEPENDENT_AMBULATORY_CARE_PROVIDER_SITE_OTHER): Payer: Medicare HMO

## 2021-11-23 ENCOUNTER — Other Ambulatory Visit: Payer: Self-pay

## 2021-11-23 ENCOUNTER — Ambulatory Visit: Payer: Medicare HMO | Admitting: Sports Medicine

## 2021-11-23 DIAGNOSIS — M1611 Unilateral primary osteoarthritis, right hip: Secondary | ICD-10-CM | POA: Diagnosis not present

## 2021-11-23 DIAGNOSIS — M25551 Pain in right hip: Secondary | ICD-10-CM | POA: Diagnosis not present

## 2021-11-27 DIAGNOSIS — M1611 Unilateral primary osteoarthritis, right hip: Secondary | ICD-10-CM | POA: Diagnosis not present

## 2021-11-27 DIAGNOSIS — M25551 Pain in right hip: Secondary | ICD-10-CM | POA: Diagnosis not present

## 2021-11-30 ENCOUNTER — Other Ambulatory Visit: Payer: Self-pay | Admitting: Orthopedic Surgery

## 2021-12-03 NOTE — Progress Notes (Signed)
Surgery orders requested via Epic inbox. °

## 2021-12-04 ENCOUNTER — Telehealth: Payer: Self-pay | Admitting: *Deleted

## 2021-12-04 NOTE — Telephone Encounter (Signed)
Primary Cardiologist:Kardie Tobb, DO  Chart reviewed as part of pre-operative protocol coverage. Because of Villas III's past medical history and time since last visit, he/she will require a follow-up visit in order to better assess preoperative cardiovascular risk.  Pre-op covering staff: - Please schedule appointment and call patient to inform them. - Please contact requesting surgeon's office via preferred method (i.e, phone, fax) to inform them of need for appointment prior to surgery.  If applicable, this message will also be routed to pharmacy pool and/or primary cardiologist for input on holding anticoagulant/antiplatelet agent as requested below so that this information is available at time of patient's appointment.   Deberah Pelton, NP  12/04/2021, 3:31 PM

## 2021-12-04 NOTE — Telephone Encounter (Signed)
° °  Pre-operative Risk Assessment    Patient Name: Joshua [name]  DOB: 07-08-1946 MRN: [id number]      Request for Surgical Clearance    Procedure:   RIGHT ANTERIOR TOTAL HIP ARTHROPLASTY  Date of Surgery:  Clearance 12/21/21                                 Surgeon:  DR. Berenice Primas Surgeon's Group or Practice Name:  [facility]  Phone number:  [phone] Fax number:  [phone]   Type of Clearance Requested:   - Medical  - Pharmacy:  Hold Apixaban (Eliquis) Per Bethena Roys, surgery scheduler, Dr. Berenice Primas request 3-5 days off anticoags   Type of Anesthesia:  Not Indicated   Additional requests/questions:      SignedJeanann Lewandowsky   12/04/2021, 8:57 AM

## 2021-12-04 NOTE — Telephone Encounter (Signed)
Patient with diagnosis of afib on Eliquis for anticoagulation.    Procedure: right anterior THA Date of procedure: 12/21/21  CHA2DS2-VASc Score = 3  This indicates a 3.2% annual risk of stroke. The patient's score is based upon: CHF History: 0 HTN History: 1 Diabetes History: 0 Stroke History: 0 Vascular Disease History: 1 Age Score: 1 Gender Score: 0   Seen 11/08/21 in ED for aphagia, negative for stroke.  CrCl 47mL/min using adjusted body weight Platelet count 206K  Per office protocol, patient can hold Eliquis for 3 days prior to procedure.

## 2021-12-04 NOTE — Telephone Encounter (Signed)
Pt has been scheduled to see Dr. Harriet Masson, 12/13/21, clearance will be addressed at that time.  Will route back to the requesting surgeon's office to make them aware.

## 2021-12-07 ENCOUNTER — Ambulatory Visit: Payer: Medicare HMO | Admitting: Sports Medicine

## 2021-12-11 NOTE — Care Plan (Signed)
Ortho Bundle Case Management Note  Patient Details  Name: Joshua [name] MRN: [id number] Date of Birth: 01-16-1946    Spoke with patient prior to surgery. He will discharge to home with family to assist. Rolling walker ordered. OPPT set up with [facility]. Patient and MD in agreement with plan. Choice offered                  DME Arranged:  Walker rolling DME Agency:  [employer]  HH Arranged:    [place] Agency:     Additional Comments: Please contact me with any questions of if this plan should need to change.  Ladell Heads,  [facility]  [phone] 12/11/2021, 11:35 AM

## 2021-12-12 NOTE — Patient Instructions (Signed)
DUE TO COVID-19 ONLY ONE VISITOR IS ALLOWED TO COME WITH YOU AND STAY IN THE WAITING ROOM ONLY DURING PRE OP AND PROCEDURE.   **NO VISITORS ARE ALLOWED IN THE SHORT STAY AREA OR RECOVERY ROOM!!**  IF YOU WILL BE ADMITTED INTO THE HOSPITAL YOU ARE ALLOWED ONLY TWO SUPPORT PEOPLE DURING VISITATION HOURS ONLY (7 AM -8PM)   The support person(s) must pass our screening, gel in and out, and wear a mask at all times, including in the patients room. Patients must also wear a mask when staff or their support person are in the room. Visitors GUEST BADGE MUST BE WORN VISIBLY  One adult visitor may remain with you overnight and MUST be in the room by 8 P.M.  No visitors under the age of 53. Any visitor under the age of 54 must be accompanied by an adult.        Your procedure is scheduled on: 12/21/21   Report to [facility] Main Entrance    Report to short stay at : 5:15 AM   Call this number if you have problems the morning of surgery [phone]   Do not eat food :After Midnight.   May have liquids until : 4:30 AM   day of surgery  CLEAR LIQUID DIET  Foods Allowed                                                                     Foods Excluded  Water, Black Coffee and tea, regular and decaf                             liquids that you cannot  Plain Jell-O in any flavor  (No red)                                           see through such as: Fruit ices (not with fruit pulp)                                     milk, soups, orange juice              Iced Popsicles (No red)                                    All solid food                                   Apple juices Sports drinks like Gatorade (No red) Lightly seasoned clear broth or consume(fat free) Sugar Sample Menu Breakfast                                Lunch  Supper Cranberry juice                    Beef broth                            Chicken broth Jell-O                                      Grape juice                           Apple juice Coffee or tea                        Jell-O                                      Popsicle                                                Coffee or tea                        Coffee or tea    Complete one Ensure drink the morning of surgery 3 hours prior to scheduled surgery at: 4:30 AM.   The day of surgery:  Drink ONE (1) Pre-Surgery Clear Ensure or G2 at AM the morning of surgery. Drink in one sitting. Do not sip.  This drink was given to you during your hospital  pre-op appointment visit. Nothing else to drink after completing the  Pre-Surgery Clear Ensure or G2.          If you have questions, please contact your surgeons office.  FOLLOW BOWEL PREP AND ANY ADDITIONAL PRE OP INSTRUCTIONS YOU RECEIVED FROM YOUR SURGEON'S OFFICE!!!  Oral Hygiene is also important to reduce your risk of infection.                                    Remember - BRUSH YOUR TEETH THE MORNING OF SURGERY WITH YOUR REGULAR TOOTHPASTE   Do NOT smoke after Midnight   Take these medicines the morning of surgery with A SIP OF WATER: Amioderone,lamotrigine,fluoxetine,prednisone,tamsulosin,amoxicillin.  DO NOT TAKE ANY ORAL DIABETIC MEDICATIONS DAY OF YOUR SURGERY                              You may not have any metal on your body including hair pins, jewelry, and body piercing             Do not wear lotions, powders, perfumes/cologne, or deodorant              Men may shave face and neck.   Do not bring valuables to the hospital. [place].   Contacts, dentures or bridgework may not be worn into surgery.   Bring small overnight bag day of surgery.  Patients discharged on the day of surgery will not be allowed to drive home.  Someone needs to stay with you for the first 24 hours after anesthesia.   Special Instructions: Bring a copy of your healthcare power of attorney and living will documents          the day of surgery if you haven't scanned them before.              Please read over the following fact sheets you were given: IF YOU HAVE QUESTIONS ABOUT YOUR PRE-OP INSTRUCTIONS PLEASE CALL [phone]     [facility] - Preparing for Surgery Before surgery, you can play an important role.  Because skin is not sterile, your skin needs to be as free of germs as possible.  You can reduce the number of germs on your skin by washing with CHG (chlorahexidine gluconate) soap before surgery.  CHG is an antiseptic cleaner which kills germs and bonds with the skin to continue killing germs even after washing. Please DO NOT use if you have an allergy to CHG or antibacterial soaps.  If your skin becomes reddened/irritated stop using the CHG and inform your nurse when you arrive at Short Stay. Do not shave (including legs and underarms) for at least 48 hours prior to the first CHG shower.  You may shave your face/neck. Please follow these instructions carefully:  1.  Shower with CHG Soap the night before surgery and the  morning of Surgery.  2.  If you choose to wash your hair, wash your hair first as usual with your  normal  shampoo.  3.  After you shampoo, rinse your hair and body thoroughly to remove the  shampoo.                           4.  Use CHG as you would any other liquid soap.  You can apply chg directly  to the skin and wash                       Gently with a scrungie or clean washcloth.  5.  Apply the CHG Soap to your body ONLY FROM THE NECK DOWN.   Do not use on face/ open                           Wound or open sores. Avoid contact with eyes, ears mouth and genitals (private parts).                       Wash face,  Genitals (private parts) with your normal soap.             6.  Wash thoroughly, paying special attention to the area where your surgery  will be performed.  7.  Thoroughly rinse your body with warm water from the neck down.  8.  DO NOT shower/wash with your normal soap  after using and rinsing off  the CHG Soap.                9.  Pat yourself dry with a clean towel.            10.  Wear clean pajamas.            11.  Place clean sheets on your bed the night of your first shower and do not  sleep with  pets. Day of Surgery : Do not apply any lotions/deodorants the morning of surgery.  Please wear clean clothes to the hospital/surgery center.  FAILURE TO FOLLOW THESE INSTRUCTIONS MAY RESULT IN THE CANCELLATION OF YOUR SURGERY PATIENT SIGNATURE_________________________________  NURSE SIGNATURE__________________________________  ________________________________________________________________________   Joshua [name]  An incentive spirometer is a tool that can help keep your lungs clear and active. This tool measures how well you are filling your lungs with each breath. Taking long deep breaths may help reverse or decrease the chance of developing breathing (pulmonary) problems (especially infection) following: A long period of time when you are unable to move or be active. BEFORE THE PROCEDURE  If the spirometer includes an indicator to show your best effort, your nurse or respiratory therapist will set it to a desired goal. If possible, sit up straight or lean slightly forward. Try not to slouch. Hold the incentive spirometer in an upright position. INSTRUCTIONS FOR USE  Sit on the edge of your bed if possible, or sit up as far as you can in bed or on a chair. Hold the incentive spirometer in an upright position. Breathe out normally. Place the mouthpiece in your mouth and seal your lips tightly around it. Breathe in slowly and as deeply as possible, raising the piston or the ball toward the top of the column. Hold your breath for 3-5 seconds or for as long as possible. Allow the piston or ball to fall to the bottom of the column. Remove the mouthpiece from your mouth and breathe out normally. Rest for a few seconds and repeat Steps 1 through 7 at  least 10 times every 1-2 hours when you are awake. Take your time and take a few normal breaths between deep breaths. The spirometer may include an indicator to show your best effort. Use the indicator as a goal to work toward during each repetition. After each set of 10 deep breaths, practice coughing to be sure your lungs are clear. If you have an incision (the cut made at the time of surgery), support your incision when coughing by placing a pillow or rolled up towels firmly against it. Once you are able to get out of bed, walk around indoors and cough well. You may stop using the incentive spirometer when instructed by your caregiver.  RISKS AND COMPLICATIONS Take your time so you do not get dizzy or light-headed. If you are in pain, you may need to take or ask for pain medication before doing incentive spirometry. It is harder to take a deep breath if you are having pain. AFTER USE Rest and breathe slowly and easily. It can be helpful to keep track of a log of your progress. Your caregiver can provide you with a simple table to help with this. If you are using the spirometer at home, follow these instructions: [place] IF:  You are having difficultly using the spirometer. You have trouble using the spirometer as often as instructed. Your pain medication is not giving enough relief while using the spirometer. You develop fever of 100.5 F (38.1 C) or higher. SEEK IMMEDIATE MEDICAL CARE IF:  You cough up bloody sputum that had not been present before. You develop fever of 102 F (38.9 C) or greater. You develop worsening pain at or near the incision site. MAKE SURE YOU:  Understand these instructions. Will watch your condition. Will get help right away if you are not doing well or get worse. Document Released: 03/03/2007 Document Revised: 01/13/2012 Document Reviewed:  05/04/2007 ExitCare Patient Information 2014 [place],  [place].   ________________________________________________________________________

## 2021-12-13 ENCOUNTER — Ambulatory Visit: Payer: Medicare HMO | Admitting: Cardiology

## 2021-12-13 ENCOUNTER — Other Ambulatory Visit: Payer: Self-pay

## 2021-12-13 ENCOUNTER — Other Ambulatory Visit: Payer: Self-pay | Admitting: *Deleted

## 2021-12-13 ENCOUNTER — Encounter: Payer: Self-pay | Admitting: Cardiology

## 2021-12-13 VITALS — BP 124/78 | HR 68 | Ht 70.0 in | Wt 222.8 lb

## 2021-12-13 DIAGNOSIS — I480 Paroxysmal atrial fibrillation: Secondary | ICD-10-CM

## 2021-12-13 DIAGNOSIS — I10 Essential (primary) hypertension: Secondary | ICD-10-CM

## 2021-12-13 DIAGNOSIS — Z87891 Personal history of nicotine dependence: Secondary | ICD-10-CM

## 2021-12-13 DIAGNOSIS — E669 Obesity, unspecified: Secondary | ICD-10-CM

## 2021-12-13 DIAGNOSIS — D6869 Other thrombophilia: Secondary | ICD-10-CM | POA: Diagnosis not present

## 2021-12-13 DIAGNOSIS — I2510 Atherosclerotic heart disease of native coronary artery without angina pectoris: Secondary | ICD-10-CM | POA: Diagnosis not present

## 2021-12-13 NOTE — Patient Instructions (Addendum)
Medication Instructions:  Your physician recommends that you continue on your current medications as directed. Please refer to the Current Medication list given to you today.  *If you need a refill on your cardiac medications before your next appointment, please call your pharmacy*   Lab Work: None If you have labs (blood work) drawn today and your tests are completely normal, you will receive your results only by: [place] (if you have MyChart) OR A paper copy in the mail If you have any lab test that is abnormal or we need to change your treatment, we will call you to review the results.   Testing/Procedures: None   Follow-Up: At [facility], you and your health needs are our priority.  As part of our continuing mission to provide you with exceptional heart care, we have created designated Provider Care Teams.  These Care Teams include your primary Cardiologist (physician) and Advanced Practice Providers (APPs -  Physician Assistants and Nurse Practitioners) who all work together to provide you with the care you need, when you need it.  We recommend signing up for the patient portal called "MyChart".  Sign up information is provided on this After Visit Summary.  MyChart is used to connect with patients for Virtual Visits (Telemedicine).  Patients are able to view lab/test results, encounter notes, upcoming appointments, etc.  Non-urgent messages can be sent to your provider as well.   To learn more about what you can do with MyChart, go to [email or web address].    Your next appointment:   6 month(s)  The format for your next appointment:   In Person  Provider:   Berniece Salines, DO     Other Instructions Hold Apixaban (Eliquis) for 2 days before your procedure (total of 4 doses). Restart minimal 12 hours after your procedure at the discretion of the physician preforming the procedure.

## 2021-12-13 NOTE — Progress Notes (Signed)
Cardiology Office Note:    Date:  12/13/2021   ID:  Joshua [name], DOB Dec 30, 1945, MRN [id number]  PCP:  [name], DO  Cardiologist:  Berniece Salines, DO  Electrophysiologist:  None   Referring MD: [name], DO   " I am doing ok- waiting for surgery"  History of Present Illness:    Joshua [name] is a 76 y.o. male with a hx of  atrial fibrillation status post DC cardioversion on November 29, 2019 and October 15, 2019 now on amiodarone and Eliquis, mild coronary artery disease, recently aorta dilatation. The patient is here today for follow-up visit.   I saw the patient in November 2021 at that time we talked about his stress test which was normal but he had symptoms I therefore proceeded the patient to get a coronary CTA.  He did get his coronary CTA which showed evidence of mild coronary artery disease with proximal ascending aorta dilatation.  I saw the patient the patient on Mar 27, 2021 at that time he appeared to be doing well from a cardiovascular standpoint.  Continue his anticoagulation.  He is here today for follow-up visit.  He is pending surgery.   No complaints   Past Medical History:  Diagnosis Date   Abnormal findings on diagnostic imaging of lung 09/23/2019   09/22/2019-lung cancer screening-centrilobular and paraseptal emphysema, calcified granulomata noted bilaterally, no suspicious nodules, lung RADS 1, repeat in 12 months    Atrial fibrillation ([place]) 09/23/2019   Centrilobular emphysema ([place]) 09/23/2019   09/22/2019-lung cancer screening-centrilobular and paraseptal emphysema, calcified granulomata noted bilaterally, no suspicious nodules, lung RADS 1, repeat in 12 months  09/23/2019-FVC 3.74 (81% predicted), postbronchodilator ratio 80, postbronchodilator FEV1 2.88 (86% predicted), no bronchodilator response, DLCO 19.44 (73% predicted)   DDD (degenerative disc disease), cervical 05/13/2008   Cervical Disc Degeneration   History of adenomatous  polyp of colon 03/03/2018   05/14/17: Colonoscopy: nonadvanced adenoma, f/u 5 yrs, use SuPrep, [place]/GAP  Last Assessment & Plan:  Relevant Hx: Course: Daily Update: Today's Plan:   Hyperlipidemia    Kidney stone    Nephrolithiasis 05/22/2011   Nontraumatic incomplete tear of right rotator cuff 10/14/2018   Other and unspecified hyperlipidemia 11/20/2009   Hyperlipidemia   RBBB 06/09/2012   Secondary hypercoagulable state ([place]) 09/28/2019   Shortness of breath 09/23/2019    Past Surgical History:  Procedure Laterality Date   CARDIOVERSION N/A 10/15/2019   Procedure: CARDIOVERSION;  Surgeon: Jerline Pain, MD;  Location: [facility];  Service: Cardiovascular;  Laterality: N/A;   CARDIOVERSION N/A 11/29/2019   Procedure: CARDIOVERSION;  Surgeon: Dorothy Spark, MD;  Location: [facility] ENDOSCOPY;  Service: Cardiovascular;  Laterality: N/A;   HEMORRHOID SURGERY      Current Medications: Current Meds  Medication Sig   amiodarone (PACERONE) 200 MG tablet TAKE 1/2 TABLET BY MOUTH DAILY   apixaban (ELIQUIS) 5 MG TABS tablet Take 5 mg by mouth 2 (two) times daily.   atorvastatin (LIPITOR) 40 MG tablet TAKE ONE TABLET BY MOUTH DAILY   clobetasol (TEMOVATE) 0.05 % external solution Apply 1 application topically once a week. Use in hair   cyclobenzaprine (FLEXERIL) 10 MG tablet One half to one tab PO qHS (muscle relaxer for nighttime use)   fluorouracil (EFUDEX) 5 % cream Apply 1 application topically daily as needed (On head over night).   FLUoxetine (PROZAC) 20 MG capsule Take 20 mg by mouth daily.    HYDROcodone-acetaminophen (NORCO/VICODIN) 5-325 MG tablet Take 1  tablet by mouth 2 (two) times daily as needed for pain.   ketoconazole (NIZORAL) 2 % shampoo Apply 1 application topically 2 (two) times a week. Monday and Thursday   lamoTRIgine (LAMICTAL) 200 MG tablet Take 100 mg by mouth 2 (two) times daily.   oxyCODONE-acetaminophen (PERCOCET/ROXICET) 5-325 MG tablet Take 1 tablet by mouth every 8  (eight) hours as needed for pain.   polyethylene glycol powder (GLYCOLAX/MIRALAX) 17 GM/SCOOP powder Take 17 g by mouth 2 (two) times daily.   tamsulosin (FLOMAX) 0.4 MG CAPS capsule Take 0.4 mg by mouth every other day.   tolnaftate (TINACTIN) 1 % cream Apply 1 application topically daily as needed (Fungus around toes).   vitamin B-6 (PYRIDOXINE) 25 MG tablet Take 100 mg by mouth daily.     Allergies:   Venlafaxine, Bupropion, Tramadol, and Zoloft [sertraline hcl]   Social History   Socioeconomic History   Marital status: Married    Spouse name: Not on file   Number of children: Not on file   Years of education: Not on file   Highest education level: Not on file  Occupational History   Not on file  Tobacco Use   Smoking status: Former    Packs/day: 2.00    Years: 46.00    Pack years: 92.00    Types: Cigarettes    Quit date: 2010    Years since quitting: 13.1   Smokeless tobacco: Never  Vaping Use   Vaping Use: Never used  Substance and Sexual Activity   Alcohol use: Yes    Alcohol/week: 14.0 standard drinks    Types: 14 Cans of beer per week   Drug use: No   Sexual activity: Yes    Partners: Female  Other Topics Concern   Not on file  Social History Narrative   Not on file   Social Determinants of Health   Financial Resource Strain: Not on file  Food Insecurity: Not on file  Transportation Needs: Not on file  Physical Activity: Not on file  Stress: Not on file  Social Connections: Not on file     Family History: The patient's family history includes Stroke in his father; Varicose Veins in his mother.  ROS:   Review of Systems  Constitution: Negative for decreased appetite, fever and weight gain.  HENT: Negative for congestion, ear discharge, hoarse voice and sore throat.   Eyes: Negative for discharge, redness, vision loss in right eye and visual halos.  Cardiovascular: Negative for chest pain, dyspnea on exertion, leg swelling, orthopnea and palpitations.   Respiratory: Negative for cough, hemoptysis, shortness of breath and snoring.   Endocrine: Negative for heat intolerance and polyphagia.  Hematologic/Lymphatic: Negative for bleeding problem. Does not bruise/bleed easily.  Skin: Negative for flushing, nail changes, rash and suspicious lesions.  Musculoskeletal: Negative for arthritis, joint pain, muscle cramps, myalgias, neck pain and stiffness.  Gastrointestinal: Negative for abdominal pain, bowel incontinence, diarrhea and excessive appetite.  Genitourinary: Negative for decreased libido, genital sores and incomplete emptying.  Neurological: Negative for brief paralysis, focal weakness, headaches and loss of balance.  Psychiatric/Behavioral: Negative for altered mental status, depression and suicidal ideas.  Allergic/Immunologic: Negative for HIV exposure and persistent infections.    EKGs/Labs/Other Studies Reviewed:    The following studies were reviewed today:   EKG:  The ekg ordered today demonstrates sinus rhythm, heart rate 68 bpm with underlying right bundle branch block.   Coronary CTA December 2021 FINDINGS: Coronary calcium score: The patient's coronary artery calcium score  is 459, which places the patient in the 55 percentile.   Coronary arteries: Normal coronary origins.  Right dominance.   Right Coronary Artery: Normal caliber vessel, gives rise to PDA. There is a small conus branch. There is a mixed calcified and noncalcified plaque at the proximal portion of the mid RCA with 1-24% stenosis.   Left Main Coronary Artery: Normal caliber vessel. Mixed calcified and noncalcified plaque in the distal left main with 1-24% stenosis.   Left Anterior Descending Coronary Artery: Normal caliber vessel. There is mixed calcified and noncalcified plaque in the proximal LAD with at least 25-49% stenosis, and higher degree stenosis cannot be excluded. There is noncalcified plaque throughout the proximal LAD, and then there is  a mixed calcified and noncalcified plaque proximal to the origin of D1 with at least 25-49% stenosis. There is scattered calcified and noncalcified plaqque throughout the mid LAD with 1-24% stenosis. Gives rise to 1 diagonal branch.   Left Circumflex Artery: Normal caliber vessel. There is mixed calcified and noncalcified plaque in the proximal LCx with 25-49% stenosis. Gives rise to 2 OM branches.   Aorta: Mildly enlarged size, 40 mm at the mid ascending aorta (level of the PA bifurcation) measured double oblique. Scattered calcifications, consistent with aortic atherosclerosis. No dissection.   Aortic Valve: Trivial calcifications. Trileaflet.   Other findings:   Normal pulmonary vein drainage into the left atrium.   Normal left atrial appendage without a thrombus.   Normal size of the pulmonary artery.   Likely small PFO with trivial left to right flow.   IMPRESSION: 1. At least mild nonobstructive CAD, CADRADS = 2. CT FFR will be performed and reported separately. Areas of most concern are in the proximal LAD, with less severe lesions in the proximal LCx and proximal/mid RCA.   2. Coronary calcium score of 459. This was 55th percentile for age and sex matched control.   3.  Normal coronary origin with right dominance.   4.  Aortic atherosclerosis   5.  Likely small PFO with trivial left to right shunt.  Recent Labs: 09/10/2021: ALT 29; BUN 19; Creat 1.41; Hemoglobin 13.6; Platelets 241; Potassium 4.6; Sodium 142; TSH 1.43  Recent Lipid Panel    Component Value Date/Time   CHOL 141 09/10/2021 0000   CHOL 139 12/10/2019 0948   TRIG 66 09/10/2021 0000   HDL 62 09/10/2021 0000   HDL 47 12/10/2019 0948   CHOLHDL 2.3 09/10/2021 0000   LDLCALC 64 09/10/2021 0000    Physical Exam:    VS:  BP 124/78    Pulse 68    Ht 5\' 10"  (1.778 m)    Wt 222 lb 12.8 oz (101.1 kg)    SpO2 94%    BMI 31.97 kg/m     Wt Readings from Last 3 Encounters:  12/13/21 222 lb 12.8 oz  (101.1 kg)  11/14/21 214 lb (97.1 kg)  10/02/21 227 lb 8 oz (103.2 kg)     GEN: Well nourished, well developed in no acute distress HEENT: Normal NECK: No JVD; No carotid bruits LYMPHATICS: No lymphadenopathy CARDIAC: S1S2 noted,RRR, no murmurs, rubs, gallops RESPIRATORY:  Clear to auscultation without rales, wheezing or rhonchi  ABDOMEN: Soft, non-tender, non-distended, +bowel sounds, no guarding. EXTREMITIES: No edema, No cyanosis, no clubbing MUSCULOSKELETAL:  No deformity  SKIN: Warm and dry NEUROLOGIC:  Alert and oriented x 3, non-focal PSYCHIATRIC:  Normal affect, good insight  ASSESSMENT:    1. Essential hypertension   2. PAF (paroxysmal atrial  fibrillation) ([place])   3. Secondary hypercoagulable state ([place])   4. Obesity (BMI 30-39.9)   5. Mild CAD    PLAN:     Clinically he appears to be doing well from the vascular standpoint.  No complaints today.  We discussed in terms of holding his Eliquis, he will hold his Eliquis for 2 days (4 consecutive doses) prior to his surgery.  The patient does not have any unstable cardiac conditions.  Upon evaluation today, He can achieve 4 METs or greater without anginal symptoms.  According to [facility] and AHA guidelines, He requires no further cardiac workup prior to his noncardiac surgery and should be at acceptable risk.  Our service is available as necessary in the perioperative period.   The patient is in agreement with the above plan. The patient left the office in stable condition.  The patient will follow up in 6 months or sooner if needed.  Repeat CTA at his next visit.   Medication Adjustments/Labs and Tests Ordered: Current medicines are reviewed at length with the patient today.  Concerns regarding medicines are outlined above.  Orders Placed This Encounter  Procedures   EKG 12-Lead   No orders of the defined types were placed in this encounter.   Patient Instructions  Medication Instructions:  Your physician recommends  that you continue on your current medications as directed. Please refer to the Current Medication list given to you today.  *If you need a refill on your cardiac medications before your next appointment, please call your pharmacy*   Lab Work: None If you have labs (blood work) drawn today and your tests are completely normal, you will receive your results only by: [place] (if you have MyChart) OR A paper copy in the mail If you have any lab test that is abnormal or we need to change your treatment, we will call you to review the results.   Testing/Procedures: None   Follow-Up: At [facility], you and your health needs are our priority.  As part of our continuing mission to provide you with exceptional heart care, we have created designated Provider Care Teams.  These Care Teams include your primary Cardiologist (physician) and Advanced Practice Providers (APPs -  Physician Assistants and Nurse Practitioners) who all work together to provide you with the care you need, when you need it.  We recommend signing up for the patient portal called "MyChart".  Sign up information is provided on this After Visit Summary.  MyChart is used to connect with patients for Virtual Visits (Telemedicine).  Patients are able to view lab/test results, encounter notes, upcoming appointments, etc.  Non-urgent messages can be sent to your provider as well.   To learn more about what you can do with MyChart, go to [email or web address].    Your next appointment:   6 month(s)  The format for your next appointment:   In Person  Provider:   Berniece Salines, DO     Other Instructions Hold Apixaban (Eliquis) for 2 days before your procedure (total of 4 doses). Restart minimal 12 hours after your procedure at the discretion of the physician preforming the procedure.     Adopting a Healthy Lifestyle.  Know what a healthy weight is for you (roughly BMI <25) and aim to maintain this   Aim for 7+  servings of fruits and vegetables daily   65-80+ fluid ounces of water or unsweet tea for healthy kidneys   Limit to max 1 drink of alcohol per day; avoid  smoking/tobacco   Limit animal fats in diet for cholesterol and heart health - choose grass fed whenever available   Avoid highly processed foods, and foods high in saturated/trans fats   Aim for low stress - take time to unwind and care for your mental health   Aim for 150 min of moderate intensity exercise weekly for heart health, and weights twice weekly for bone health   Aim for 7-9 hours of sleep daily   When it comes to diets, agreement about the perfect plan isnt easy to find, even among the experts. Experts at the [employer] developed an idea known as the Healthy Eating Plate. Just imagine a plate divided into logical, healthy portions.   The emphasis is on diet quality:   Load up on vegetables and fruits - one-half of your plate: Aim for color and variety, and remember that potatoes dont count.   Go for whole grains - one-quarter of your plate: Whole wheat, barley, wheat berries, quinoa, oats, brown rice, and foods made with them. If you want pasta, go with whole wheat pasta.   Protein power - one-quarter of your plate: Fish, chicken, beans, and nuts are all healthy, versatile protein sources. Limit red meat.   The diet, however, does go beyond the plate, offering a few other suggestions.   Use healthy plant oils, such as olive, canola, soy, corn, sunflower and peanut. Check the labels, and avoid partially hydrogenated oil, which have unhealthy trans fats.   If youre thirsty, drink water. Coffee and tea are good in moderation, but skip sugary drinks and limit milk and dairy products to one or two daily servings.   The type of carbohydrate in the diet is more important than the amount. Some sources of carbohydrates, such as vegetables, fruits, whole grains, and beans-are healthier than others.    Finally, stay active  Signed, Berniece Salines, DO  12/13/2021 3:14 PM    [facility]

## 2021-12-14 ENCOUNTER — Other Ambulatory Visit: Payer: Self-pay

## 2021-12-14 ENCOUNTER — Encounter (HOSPITAL_COMMUNITY)
Admission: RE | Admit: 2021-12-14 | Discharge: 2021-12-14 | Disposition: A | Discharge status: Home / Self Care | Payer: Medicare HMO | Source: Ambulatory Visit | Attending: Orthopedic Surgery | Admitting: Orthopedic Surgery

## 2021-12-14 ENCOUNTER — Ambulatory Visit (HOSPITAL_COMMUNITY)
Admission: RE | Admit: 2021-12-14 | Discharge: 2021-12-14 | Disposition: A | Discharge status: Home / Self Care | Payer: Medicare HMO | Source: Ambulatory Visit | Attending: Orthopedic Surgery | Admitting: Orthopedic Surgery

## 2021-12-14 ENCOUNTER — Encounter (HOSPITAL_COMMUNITY): Payer: Self-pay

## 2021-12-14 VITALS — BP 128/79 | HR 60 | Temp 97.8°F | Ht 70.0 in | Wt 222.0 lb

## 2021-12-14 DIAGNOSIS — Z01811 Encounter for preprocedural respiratory examination: Secondary | ICD-10-CM

## 2021-12-14 DIAGNOSIS — I700 Atherosclerosis of aorta: Secondary | ICD-10-CM | POA: Diagnosis not present

## 2021-12-14 DIAGNOSIS — I10 Essential (primary) hypertension: Secondary | ICD-10-CM | POA: Insufficient documentation

## 2021-12-14 DIAGNOSIS — Z01818 Encounter for other preprocedural examination: Secondary | ICD-10-CM | POA: Diagnosis not present

## 2021-12-14 HISTORY — DX: Depression, unspecified: F32.A

## 2021-12-14 HISTORY — DX: Anxiety disorder, unspecified: F41.9

## 2021-12-14 LAB — CBC
HCT: 39 % (ref 39.0–52.0)
Hemoglobin: 12.9 g/dL — ABNORMAL LOW (ref 13.0–17.0)
MCH: 33.4 pg (ref 26.0–34.0)
MCHC: 33.1 g/dL (ref 30.0–36.0)
MCV: 101 fL — ABNORMAL HIGH (ref 80.0–100.0)
Platelets: 271 10*3/uL (ref 150–400)
RBC: 3.86 MIL/uL — ABNORMAL LOW (ref 4.22–5.81)
RDW: 13.4 % (ref 11.5–15.5)
WBC: 4 10*3/uL (ref 4.0–10.5)
nRBC: 0 % (ref 0.0–0.2)

## 2021-12-14 LAB — BASIC METABOLIC PANEL
Anion gap: 8 (ref 5–15)
BUN: 24 mg/dL — ABNORMAL HIGH (ref 8–23)
CO2: 26 mmol/L (ref 22–32)
Calcium: 9.1 mg/dL (ref 8.9–10.3)
Chloride: 106 mmol/L (ref 98–111)
Creatinine, Ser: 1.25 mg/dL — ABNORMAL HIGH (ref 0.61–1.24)
GFR, Estimated: >60 mL/min (ref >60)
Glucose, Bld: 106 mg/dL — ABNORMAL HIGH (ref 70–99)
Potassium: 4.3 mmol/L (ref 3.5–5.1)
Sodium: 140 mmol/L (ref 135–145)

## 2021-12-14 LAB — SURGICAL PCR SCREEN
MRSA, PCR: NEGATIVE
Staphylococcus aureus: NEGATIVE

## 2021-12-14 NOTE — Progress Notes (Signed)
COVID Vaccine Completed: Yes Date COVID Vaccine completed: 08/20/21 x 4 COVID vaccine manufacturer: Pfizer     COVID Test: N/A Bowel prep reminder:N/A  PCP - DO: [name] Cardiologist - DO: Kardie Tobb. LOV: 12/13/20  Chest x-ray -  EKG - 11/09/21 CEW.  Stress Test -  ECHO - 11/09/21 Cardiac Cath -  Pacemaker/ICD device last checked:  Sleep Study -  CPAP -   Fasting Blood Sugar -  Checks Blood Sugar _____ times a day  Blood Thinner Instructions: Eliquis will be held 2 days before surgery Aspirin Instructions: Dr. Berenice Primas Last Dose:  Anesthesia review: Hx: Afib,Rt. BBB  Patient denies shortness of breath, fever, cough and chest pain at PAT appointment   Patient verbalized understanding of instructions that were given to them at the PAT appointment. Patient was also instructed that they will need to review over the PAT instructions again at home before surgery.

## 2021-12-17 NOTE — Anesthesia Preprocedure Evaluation (Addendum)
Anesthesia Evaluation  Patient identified by MRN, date of birth, ID band Patient awake    Reviewed: Allergy & Precautions, NPO status , Patient's Chart, lab work & pertinent test results  History of Anesthesia Complications Negative for: history of anesthetic complications  Airway Mallampati: IV  TM Distance: >3 FB Neck ROM: Full    Dental  (+) Dental Advisory Given, Teeth Intact   Pulmonary neg sleep apnea, neg COPD, former smoker,    breath sounds clear to auscultation       Cardiovascular hypertension, (-) angina+ CAD and + Peripheral Vascular Disease  (-) Past MI + dysrhythmias Atrial Fibrillation  Rhythm:Regular  1. Left ventricular ejection fraction, by estimation, is 55 to 60%. The  left ventricle has normal function. The left ventricle has no regional  wall motion abnormalities. Left ventricular diastolic parameters are  consistent with Grade I diastolic  dysfunction (impaired relaxation).  2. Right ventricular systolic function is normal. The right ventricular  size is moderately enlarged.  3. Left atrial size was moderately dilated.  4. Right atrial size was mildly dilated.  5. The mitral valve is normal in structure. Trivial mitral valve  regurgitation. No evidence of mitral stenosis.  6. The aortic valve is normal in structure. Aortic valve regurgitation is  not visualized. Mild aortic valve sclerosis is present, with no evidence  of aortic valve stenosis.  7. Aneurysm of the ascending aorta, measuring 42 mm.  8. The inferior vena cava is normal in size with greater than 50%  respiratory variability, suggesting right atrial pressure of 3 mmHg.    Neuro/Psych neg Seizures PSYCHIATRIC DISORDERS Anxiety Depression    GI/Hepatic negative GI ROS, Neg liver ROS,   Endo/Other  negative endocrine ROS  Renal/GU Renal diseaseLab Results      Component                Value               Date                       CREATININE               1.25 (H)            12/14/2021                Musculoskeletal  (+) Arthritis ,   Abdominal   Peds  Hematology  (+) Blood dyscrasia, anemia , Lab Results      Component                Value               Date                      WBC                      4.0                 12/14/2021                HGB                      12.9 (L)            12/14/2021                HCT  39.0                12/14/2021                MCV                      101.0 (H)           12/14/2021                PLT                      271                 12/14/2021            eliquis last dose 2/15 1800   Anesthesia Other Findings   Reproductive/Obstetrics                            Anesthesia Physical Anesthesia Plan  ASA: 2  Anesthesia Plan: General   Post-op Pain Management:    Induction: Intravenous  PONV Risk Score and Plan: 2 and Ondansetron and Dexamethasone  Airway Management Planned: Oral ETT  Additional Equipment: None  Intra-op Plan:   Post-operative Plan: Extubation in OR  Informed Consent: I have reviewed the patients History and Physical, chart, labs and discussed the procedure including the risks, benefits and alternatives for the proposed anesthesia with the patient or authorized representative who has indicated his/her understanding and acceptance.     Dental advisory given  Plan Discussed with: CRNA and Anesthesiologist  Anesthesia Plan Comments: (See PAT note 12/14/2021, Konrad Felix Ward, PA-C)       Anesthesia Quick Evaluation

## 2021-12-17 NOTE — Progress Notes (Addendum)
Anesthesia Chart Review   Case: [id number] Date/Time: 12/21/21 0715   Procedure: TOTAL HIP ARTHROPLASTY ANTERIOR APPROACH (Right: Hip)   Anesthesia type: Spinal   Pre-op diagnosis: RIGHT HIP DEGENERATIVE JOINT DISEASE   Location: [facility] ROOM 08 / [facility]   Surgeons: Dorna Leitz, MD       DISCUSSION:76 y.o. former smoker with h/o atrial fibrillation s/p cardioversion (on Eliquis), RBBB, emphysema, right hip djd scheduled for above procedure 12/21/2021 with Dr. Dorna Leitz.   Pt last seen by cardiology 12/13/2021. Per OV note, "Clinically he appears to be doing well from the vascular standpoint.  No complaints today.  We discussed in terms of holding his Eliquis, he will hold his Eliquis for 2 days (4 consecutive doses) prior to his surgery.   The patient does not have any unstable cardiac conditions.  Upon evaluation today, He can achieve 4 METs or greater without anginal symptoms.  According to [facility] and AHA guidelines, He requires no further cardiac workup prior to his noncardiac surgery and should be at acceptable risk.  Our service is available as necessary in the perioperative period."  Discussed with Dr. Harriet Masson, pt ok to hold 3 days as he is scheduled for spinal.  Pt informed. Discussed with surgeon's office as well.   Anticipate pt can proceed with planned procedure barring acute status change.    VS: BP 128/79    Pulse 60    Temp 36.6 C (Oral)    Ht 5\' 10"  (1.778 m)    Wt 100.7 kg    SpO2 94%    BMI 31.85 kg/m   PROVIDERS: [name], DO is PCP   Berniece Salines, DO, is Cardiologist  LABS: Labs reviewed: Acceptable for surgery. (all labs ordered are listed, but only abnormal results are displayed)  Labs Reviewed  CBC - Abnormal; Notable for the following components:      Result Value   RBC 3.86 (*)    Hemoglobin 12.9 (*)    MCV 101.0 (*)    All other components within normal limits  BASIC METABOLIC PANEL - Abnormal; Notable for the following components:   Glucose, Bld 106 (*)     BUN 24 (*)    Creatinine, Ser 1.25 (*)    All other components within normal limits  SURGICAL PCR SCREEN  TYPE AND SCREEN     IMAGES:   EKG: 01/15/2021 Rate 53 bpm  Sinus bradycardia Right bundle branch block Abnormal ECG No significant change since last tracing  CV: Echo 10/25/20 1. Left ventricular ejection fraction, by estimation, is 55 to 60%. The  left ventricle has normal function. The left ventricle has no regional  wall motion abnormalities. Left ventricular diastolic parameters are  consistent with Grade I diastolic  dysfunction (impaired relaxation).   2. Right ventricular systolic function is normal. The right ventricular  size is moderately enlarged.   3. Left atrial size was moderately dilated.   4. Right atrial size was mildly dilated.   5. The mitral valve is normal in structure. Trivial mitral valve  regurgitation. No evidence of mitral stenosis.   6. The aortic valve is normal in structure. Aortic valve regurgitation is  not visualized. Mild aortic valve sclerosis is present, with no evidence  of aortic valve stenosis.   7. Aneurysm of the ascending aorta, measuring 42 mm.   8. The inferior vena cava is normal in size with greater than 50%  respiratory variability, suggesting right atrial pressure of 3 mmHg.   Myocardial Perfusion 12/20/2019  The left ventricular ejection fraction is mildly decreased (45-54%). Nuclear stress EF: 53%. There was no ST segment deviation noted during stress. The study is normal. This is a low risk study. Past Medical History:  Diagnosis Date   Abnormal findings on diagnostic imaging of lung 09/23/2019   09/22/2019-lung cancer screening-centrilobular and paraseptal emphysema, calcified granulomata noted bilaterally, no suspicious nodules, lung RADS 1, repeat in 12 months    Anxiety    Atrial fibrillation ([place]) 09/23/2019   Centrilobular emphysema ([place]) 09/23/2019   09/22/2019-lung cancer screening-centrilobular and  paraseptal emphysema, calcified granulomata noted bilaterally, no suspicious nodules, lung RADS 1, repeat in 12 months  09/23/2019-FVC 3.74 (81% predicted), postbronchodilator ratio 80, postbronchodilator FEV1 2.88 (86% predicted), no bronchodilator response, DLCO 19.44 (73% predicted)   DDD (degenerative disc disease), cervical 05/13/2008   Cervical Disc Degeneration   Depression    History of adenomatous polyp of colon 03/03/2018   05/14/17: Colonoscopy: nonadvanced adenoma, f/u 5 yrs, use SuPrep, Murphy/GAP  Last Assessment & Plan:  Relevant Hx: Course: Daily Update: Today's Plan:   Hyperlipidemia    Kidney stone    Nephrolithiasis 05/22/2011   Nontraumatic incomplete tear of right rotator cuff 10/14/2018   Other and unspecified hyperlipidemia 11/20/2009   Hyperlipidemia   RBBB 06/09/2012   Secondary hypercoagulable state ([place]) 09/28/2019   Shortness of breath 09/23/2019    Past Surgical History:  Procedure Laterality Date   CARDIOVERSION N/A 10/15/2019   Procedure: CARDIOVERSION;  Surgeon: Jerline Pain, MD;  Location: [facility] ENDOSCOPY;  Service: Cardiovascular;  Laterality: N/A;   CARDIOVERSION N/A 11/29/2019   Procedure: CARDIOVERSION;  Surgeon: Dorothy Spark, MD;  Location: [facility] ENDOSCOPY;  Service: Cardiovascular;  Laterality: N/A;   HEMORRHOID SURGERY     VARICOSE VEIN SURGERY Right     MEDICATIONS:  amiodarone (PACERONE) 200 MG tablet   apixaban (ELIQUIS) 5 MG TABS tablet   atorvastatin (LIPITOR) 40 MG tablet   clobetasol (TEMOVATE) 0.05 % external solution   cyclobenzaprine (FLEXERIL) 10 MG tablet   fluorouracil (EFUDEX) 5 % cream   FLUoxetine (PROZAC) 20 MG capsule   HYDROcodone-acetaminophen (NORCO/VICODIN) 5-325 MG tablet   ketoconazole (NIZORAL) 2 % shampoo   lamoTRIgine (LAMICTAL) 200 MG tablet   oxyCODONE-acetaminophen (PERCOCET/ROXICET) 5-325 MG tablet   polyethylene glycol powder (GLYCOLAX/MIRALAX) 17 GM/SCOOP powder   tamsulosin (FLOMAX) 0.4 MG CAPS capsule    tolnaftate (TINACTIN) 1 % cream   vitamin B-6 (PYRIDOXINE) 25 MG tablet   No current facility-administered medications for this encounter.   Konrad Felix Ward, PA-C [facility] Pre-Surgical Testing [phone]

## 2021-12-21 ENCOUNTER — Ambulatory Visit (HOSPITAL_COMMUNITY): Payer: Medicare HMO

## 2021-12-21 ENCOUNTER — Ambulatory Visit (HOSPITAL_BASED_OUTPATIENT_CLINIC_OR_DEPARTMENT_OTHER): Payer: Medicare HMO | Admitting: Anesthesiology

## 2021-12-21 ENCOUNTER — Ambulatory Visit (HOSPITAL_COMMUNITY): Payer: Medicare HMO | Admitting: Physician Assistant

## 2021-12-21 ENCOUNTER — Encounter (HOSPITAL_COMMUNITY)
Admission: RE | Disposition: A | Discharge status: Home / Self Care | Payer: Self-pay | Source: Home / Self Care | Attending: Orthopedic Surgery

## 2021-12-21 ENCOUNTER — Other Ambulatory Visit: Payer: Self-pay

## 2021-12-21 ENCOUNTER — Observation Stay (HOSPITAL_COMMUNITY)
Admission: RE | Admit: 2021-12-21 | Discharge: 2021-12-22 | Disposition: A | Discharge status: Home / Self Care | Payer: Medicare HMO | Attending: Orthopedic Surgery | Admitting: Orthopedic Surgery

## 2021-12-21 ENCOUNTER — Encounter (HOSPITAL_COMMUNITY): Payer: Self-pay | Admitting: Orthopedic Surgery

## 2021-12-21 DIAGNOSIS — Z87891 Personal history of nicotine dependence: Secondary | ICD-10-CM | POA: Insufficient documentation

## 2021-12-21 DIAGNOSIS — M1611 Unilateral primary osteoarthritis, right hip: Principal | ICD-10-CM | POA: Insufficient documentation

## 2021-12-21 DIAGNOSIS — Z471 Aftercare following joint replacement surgery: Secondary | ICD-10-CM | POA: Diagnosis not present

## 2021-12-21 DIAGNOSIS — Z79899 Other long term (current) drug therapy: Secondary | ICD-10-CM | POA: Insufficient documentation

## 2021-12-21 DIAGNOSIS — I2510 Atherosclerotic heart disease of native coronary artery without angina pectoris: Secondary | ICD-10-CM | POA: Diagnosis not present

## 2021-12-21 DIAGNOSIS — Z6831 Body mass index (BMI) 31.0-31.9, adult: Secondary | ICD-10-CM | POA: Insufficient documentation

## 2021-12-21 DIAGNOSIS — I10 Essential (primary) hypertension: Secondary | ICD-10-CM | POA: Insufficient documentation

## 2021-12-21 DIAGNOSIS — E669 Obesity, unspecified: Secondary | ICD-10-CM | POA: Diagnosis not present

## 2021-12-21 DIAGNOSIS — Z96641 Presence of right artificial hip joint: Secondary | ICD-10-CM | POA: Diagnosis not present

## 2021-12-21 HISTORY — PX: TOTAL HIP ARTHROPLASTY: SHX124

## 2021-12-21 LAB — CBC
HCT: 30.3 % — ABNORMAL LOW (ref 39.0–52.0)
Hemoglobin: 9.9 g/dL — ABNORMAL LOW (ref 13.0–17.0)
MCH: 33.3 pg (ref 26.0–34.0)
MCHC: 32.7 g/dL (ref 30.0–36.0)
MCV: 102 fL — ABNORMAL HIGH (ref 80.0–100.0)
Platelets: 237 10*3/uL (ref 150–400)
RBC: 2.97 MIL/uL — ABNORMAL LOW (ref 4.22–5.81)
RDW: 13.6 % (ref 11.5–15.5)
WBC: 12.7 10*3/uL — ABNORMAL HIGH (ref 4.0–10.5)
nRBC: 0 % (ref 0.0–0.2)

## 2021-12-21 LAB — ABO/RH: ABO/RH(D): A POS

## 2021-12-21 LAB — TYPE AND SCREEN
ABO/RH(D): A POS
Antibody Screen: NEGATIVE

## 2021-12-21 SURGERY — ARTHROPLASTY, HIP, TOTAL, ANTERIOR APPROACH
Anesthesia: General | Site: Hip | Laterality: Right

## 2021-12-21 MED ORDER — TIZANIDINE HCL 2 MG PO TABS: 2.0000 mg | ORAL_TABLET | Freq: Three times a day (TID) | ORAL | 0 refills | Status: DC | PRN

## 2021-12-21 MED ORDER — PHENYLEPHRINE 40 MCG/ML (10ML) SYRINGE FOR IV PUSH (FOR BLOOD PRESSURE SUPPORT)
PREFILLED_SYRINGE | INTRAVENOUS | Status: DC | PRN
  Administered 2021-12-21: 40 ug via INTRAVENOUS
  Administered 2021-12-21 (×2): 80 ug via INTRAVENOUS
  Administered 2021-12-21: 120 ug via INTRAVENOUS

## 2021-12-21 MED ORDER — TRANEXAMIC ACID-NACL 1000-0.7 MG/100ML-% IV SOLN
1000.0000 mg | Freq: Once | INTRAVENOUS | Status: AC
  Administered 2021-12-21: 1000 mg via INTRAVENOUS

## 2021-12-21 MED ORDER — BUPIVACAINE LIPOSOME 1.3 % IJ SUSP: 10.0000 mL | Freq: Once | INTRAMUSCULAR | Status: DC

## 2021-12-21 MED ORDER — ORAL CARE MOUTH RINSE: 15.0000 mL | Freq: Once | OROMUCOSAL | Status: AC

## 2021-12-21 MED ORDER — WATER FOR IRRIGATION, STERILE IR SOLN
Status: DC | PRN
  Administered 2021-12-21: 2000 mL

## 2021-12-21 MED ORDER — FENTANYL CITRATE (PF) 250 MCG/5ML IJ SOLN
INTRAMUSCULAR | Status: DC | PRN
  Administered 2021-12-21: 100 ug via INTRAVENOUS
  Administered 2021-12-21: 50 ug via INTRAVENOUS
  Administered 2021-12-21: 100 ug via INTRAVENOUS

## 2021-12-21 MED ORDER — OXYCODONE HCL 5 MG/5ML PO SOLN: 5.0000 mg | Freq: Once | ORAL | Status: DC | PRN

## 2021-12-21 MED ORDER — BUPIVACAINE-EPINEPHRINE (PF) 0.25% -1:200000 IJ SOLN
INTRAMUSCULAR | Status: AC
  Filled 2021-12-21: qty 30

## 2021-12-21 MED ORDER — SODIUM CHLORIDE 0.9 % IR SOLN
Status: DC | PRN
  Administered 2021-12-21: 1000 mL

## 2021-12-21 MED ORDER — DIPHENHYDRAMINE HCL 12.5 MG/5ML PO ELIX: 12.5000 mg | ORAL_SOLUTION | ORAL | Status: DC | PRN

## 2021-12-21 MED ORDER — DEXAMETHASONE SODIUM PHOSPHATE 10 MG/ML IJ SOLN
10.0000 mg | Freq: Once | INTRAMUSCULAR | Status: AC
  Administered 2021-12-22: 10 mg via INTRAVENOUS
  Filled 2021-12-21: qty 1

## 2021-12-21 MED ORDER — ACETAMINOPHEN 325 MG PO TABS: 325.0000 mg | ORAL_TABLET | Freq: Four times a day (QID) | ORAL | Status: DC | PRN

## 2021-12-21 MED ORDER — METHOCARBAMOL 500 MG PO TABS
500.0000 mg | ORAL_TABLET | Freq: Four times a day (QID) | ORAL | Status: DC | PRN
  Administered 2021-12-21 – 2021-12-22 (×2): 500 mg via ORAL
  Filled 2021-12-21 (×2): qty 1

## 2021-12-21 MED ORDER — POVIDONE-IODINE 10 % EX SWAB
2.0000 "application " | Freq: Once | CUTANEOUS | Status: AC
  Administered 2021-12-21: 2 via TOPICAL

## 2021-12-21 MED ORDER — OXYCODONE HCL 5 MG PO TABS
5.0000 mg | ORAL_TABLET | ORAL | Status: DC | PRN
  Administered 2021-12-21: 5 mg via ORAL
  Administered 2021-12-21 – 2021-12-22 (×4): 10 mg via ORAL
  Filled 2021-12-21 (×3): qty 2
  Filled 2021-12-21: qty 1
  Filled 2021-12-21: qty 2

## 2021-12-21 MED ORDER — EPHEDRINE SULFATE-NACL 50-0.9 MG/10ML-% IV SOSY
PREFILLED_SYRINGE | INTRAVENOUS | Status: DC | PRN
  Administered 2021-12-21 (×2): 10 mg via INTRAVENOUS

## 2021-12-21 MED ORDER — LACTATED RINGERS IV BOLUS: 250.0000 mL | Freq: Once | INTRAVENOUS | Status: DC

## 2021-12-21 MED ORDER — TRANEXAMIC ACID-NACL 1000-0.7 MG/100ML-% IV SOLN
1000.0000 mg | INTRAVENOUS | Status: AC
  Administered 2021-12-21: 1000 mg via INTRAVENOUS
  Filled 2021-12-21: qty 100

## 2021-12-21 MED ORDER — PROPOFOL 10 MG/ML IV BOLUS
INTRAVENOUS | Status: DC | PRN
  Administered 2021-12-21: 150 mg via INTRAVENOUS
  Administered 2021-12-21: 50 mg via INTRAVENOUS

## 2021-12-21 MED ORDER — CEFAZOLIN SODIUM-DEXTROSE 2-4 GM/100ML-% IV SOLN
2.0000 g | INTRAVENOUS | Status: AC
  Administered 2021-12-21: 2 g via INTRAVENOUS
  Filled 2021-12-21: qty 100

## 2021-12-21 MED ORDER — ACETAMINOPHEN 10 MG/ML IV SOLN
INTRAVENOUS | Status: AC
  Filled 2021-12-21: qty 100

## 2021-12-21 MED ORDER — ONDANSETRON HCL 4 MG/2ML IJ SOLN
INTRAMUSCULAR | Status: DC | PRN
  Administered 2021-12-21: 4 mg via INTRAVENOUS

## 2021-12-21 MED ORDER — ACETAMINOPHEN 500 MG PO TABS: 1000.0000 mg | ORAL_TABLET | Freq: Once | ORAL | Status: DC | PRN

## 2021-12-21 MED ORDER — PROPOFOL 500 MG/50ML IV EMUL
INTRAVENOUS | Status: AC
  Filled 2021-12-21: qty 50

## 2021-12-21 MED ORDER — HYDROMORPHONE HCL 2 MG/ML IJ SOLN
INTRAMUSCULAR | Status: AC
  Filled 2021-12-21: qty 1

## 2021-12-21 MED ORDER — ACETAMINOPHEN 10 MG/ML IV SOLN
1000.0000 mg | Freq: Once | INTRAVENOUS | Status: DC | PRN
  Administered 2021-12-21: 1000 mg via INTRAVENOUS

## 2021-12-21 MED ORDER — TRANEXAMIC ACID 1000 MG/10ML IV SOLN
2000.0000 mg | Freq: Once | INTRAVENOUS | Status: AC
  Administered 2021-12-21: 2000 mg via TOPICAL
  Filled 2021-12-21: qty 20

## 2021-12-21 MED ORDER — LACTATED RINGERS IV BOLUS
500.0000 mL | Freq: Once | INTRAVENOUS | Status: AC
  Administered 2021-12-21: 500 mL via INTRAVENOUS

## 2021-12-21 MED ORDER — PROMETHAZINE HCL 25 MG/ML IJ SOLN
INTRAMUSCULAR | Status: AC
  Filled 2021-12-21: qty 1

## 2021-12-21 MED ORDER — PHENYLEPHRINE 40 MCG/ML (10ML) SYRINGE FOR IV PUSH (FOR BLOOD PRESSURE SUPPORT)
PREFILLED_SYRINGE | INTRAVENOUS | Status: AC
  Filled 2021-12-21: qty 10

## 2021-12-21 MED ORDER — ENSURE ENLIVE PO LIQD
237.0000 mL | Freq: Two times a day (BID) | ORAL | Status: DC
  Administered 2021-12-22: 237 mL via ORAL

## 2021-12-21 MED ORDER — DEXAMETHASONE SODIUM PHOSPHATE 10 MG/ML IJ SOLN
INTRAMUSCULAR | Status: DC | PRN
  Administered 2021-12-21: 5 mg via INTRAVENOUS

## 2021-12-21 MED ORDER — MENTHOL 3 MG MT LOZG: 1.0000 | LOZENGE | OROMUCOSAL | Status: DC | PRN

## 2021-12-21 MED ORDER — DOCUSATE SODIUM 100 MG PO CAPS
100.0000 mg | ORAL_CAPSULE | Freq: Two times a day (BID) | ORAL | Status: DC
  Administered 2021-12-21 – 2021-12-22 (×2): 100 mg via ORAL
  Filled 2021-12-21 (×2): qty 1

## 2021-12-21 MED ORDER — ALUM & MAG HYDROXIDE-SIMETH 200-200-20 MG/5ML PO SUSP: 30.0000 mL | ORAL | Status: DC | PRN

## 2021-12-21 MED ORDER — PHENOL 1.4 % MT LIQD: 1.0000 | OROMUCOSAL | Status: DC | PRN

## 2021-12-21 MED ORDER — ROCURONIUM BROMIDE 10 MG/ML (PF) SYRINGE
PREFILLED_SYRINGE | INTRAVENOUS | Status: AC
  Filled 2021-12-21: qty 10

## 2021-12-21 MED ORDER — FENTANYL CITRATE (PF) 100 MCG/2ML IJ SOLN
INTRAMUSCULAR | Status: AC
  Filled 2021-12-21: qty 2

## 2021-12-21 MED ORDER — FENTANYL CITRATE PF 50 MCG/ML IJ SOSY
25.0000 ug | PREFILLED_SYRINGE | INTRAMUSCULAR | Status: DC | PRN
  Administered 2021-12-21 (×3): 50 ug via INTRAVENOUS

## 2021-12-21 MED ORDER — METHOCARBAMOL 500 MG IVPB - SIMPLE MED
500.0000 mg | Freq: Four times a day (QID) | INTRAVENOUS | Status: DC | PRN
  Administered 2021-12-21: 500 mg via INTRAVENOUS
  Filled 2021-12-21: qty 50

## 2021-12-21 MED ORDER — ALBUMIN HUMAN 5 % IV SOLN
INTRAVENOUS | Status: AC
  Filled 2021-12-21: qty 250

## 2021-12-21 MED ORDER — HYDROMORPHONE HCL 1 MG/ML IJ SOLN
0.2500 mg | INTRAMUSCULAR | Status: DC | PRN
  Administered 2021-12-21 (×2): 0.5 mg via INTRAVENOUS

## 2021-12-21 MED ORDER — SODIUM CHLORIDE (PF) 0.9 % IJ SOLN
INTRAMUSCULAR | Status: AC
  Filled 2021-12-21: qty 10

## 2021-12-21 MED ORDER — CEFAZOLIN SODIUM-DEXTROSE 2-4 GM/100ML-% IV SOLN
2.0000 g | Freq: Once | INTRAVENOUS | Status: AC
  Administered 2021-12-21: 2 g via INTRAVENOUS

## 2021-12-21 MED ORDER — DOCUSATE SODIUM 100 MG PO CAPS: 100.0000 mg | ORAL_CAPSULE | Freq: Two times a day (BID) | ORAL | 0 refills | Status: DC

## 2021-12-21 MED ORDER — BUPIVACAINE-EPINEPHRINE 0.25% -1:200000 IJ SOLN
INTRAMUSCULAR | Status: DC | PRN
  Administered 2021-12-21: 30 mL

## 2021-12-21 MED ORDER — EPHEDRINE 5 MG/ML INJ
INTRAVENOUS | Status: AC
  Filled 2021-12-21: qty 5

## 2021-12-21 MED ORDER — ROCURONIUM BROMIDE 10 MG/ML (PF) SYRINGE
PREFILLED_SYRINGE | INTRAVENOUS | Status: DC | PRN
  Administered 2021-12-21: 20 mg via INTRAVENOUS
  Administered 2021-12-21: 10 mg via INTRAVENOUS
  Administered 2021-12-21: 70 mg via INTRAVENOUS

## 2021-12-21 MED ORDER — FENTANYL CITRATE PF 50 MCG/ML IJ SOSY
PREFILLED_SYRINGE | INTRAMUSCULAR | Status: AC
  Filled 2021-12-21: qty 3

## 2021-12-21 MED ORDER — PROPOFOL 10 MG/ML IV BOLUS
INTRAVENOUS | Status: AC
  Filled 2021-12-21: qty 20

## 2021-12-21 MED ORDER — SUGAMMADEX SODIUM 200 MG/2ML IV SOLN
INTRAVENOUS | Status: DC | PRN
  Administered 2021-12-21: 200 mg via INTRAVENOUS

## 2021-12-21 MED ORDER — HYDROMORPHONE HCL 1 MG/ML IJ SOLN
INTRAMUSCULAR | Status: AC
  Filled 2021-12-21: qty 2

## 2021-12-21 MED ORDER — PROMETHAZINE HCL 25 MG/ML IJ SOLN
6.2500 mg | Freq: Once | INTRAMUSCULAR | Status: AC
  Administered 2021-12-21: 6.25 mg via INTRAVENOUS

## 2021-12-21 MED ORDER — TRANEXAMIC ACID-NACL 1000-0.7 MG/100ML-% IV SOLN
INTRAVENOUS | Status: AC
  Filled 2021-12-21: qty 100

## 2021-12-21 MED ORDER — CHLORHEXIDINE GLUCONATE 0.12 % MT SOLN
15.0000 mL | Freq: Once | OROMUCOSAL | Status: AC
  Administered 2021-12-21: 15 mL via OROMUCOSAL

## 2021-12-21 MED ORDER — DEXAMETHASONE SODIUM PHOSPHATE 10 MG/ML IJ SOLN: 10.0000 mg | Freq: Once | INTRAMUSCULAR | Status: DC

## 2021-12-21 MED ORDER — HYDROMORPHONE HCL 1 MG/ML IJ SOLN
0.5000 mg | INTRAMUSCULAR | Status: DC | PRN
  Administered 2021-12-21: 0.5 mg via INTRAVENOUS
  Filled 2021-12-21: qty 1

## 2021-12-21 MED ORDER — OXYCODONE HCL 5 MG PO TABS: 5.0000 mg | ORAL_TABLET | Freq: Once | ORAL | Status: DC | PRN

## 2021-12-21 MED ORDER — METHOCARBAMOL 500 MG IVPB - SIMPLE MED
INTRAVENOUS | Status: AC
  Filled 2021-12-21: qty 50

## 2021-12-21 MED ORDER — DEXAMETHASONE SODIUM PHOSPHATE 10 MG/ML IJ SOLN
INTRAMUSCULAR | Status: AC
  Filled 2021-12-21: qty 1

## 2021-12-21 MED ORDER — ONDANSETRON HCL 4 MG PO TABS
4.0000 mg | ORAL_TABLET | Freq: Four times a day (QID) | ORAL | Status: DC | PRN
  Filled 2021-12-21: qty 1

## 2021-12-21 MED ORDER — LACTATED RINGERS IV SOLN: INTRAVENOUS | Status: DC

## 2021-12-21 MED ORDER — FENTANYL CITRATE (PF) 250 MCG/5ML IJ SOLN
INTRAMUSCULAR | Status: AC
  Filled 2021-12-21: qty 5

## 2021-12-21 MED ORDER — HYDROMORPHONE HCL 1 MG/ML IJ SOLN
INTRAMUSCULAR | Status: DC | PRN
  Administered 2021-12-21 (×2): .4 mg via INTRAVENOUS

## 2021-12-21 MED ORDER — MIDAZOLAM HCL 2 MG/2ML IJ SOLN
INTRAMUSCULAR | Status: AC
  Filled 2021-12-21: qty 2

## 2021-12-21 MED ORDER — ONDANSETRON HCL 4 MG/2ML IJ SOLN
INTRAMUSCULAR | Status: AC
  Filled 2021-12-21: qty 2

## 2021-12-21 MED ORDER — LIDOCAINE HCL (PF) 2 % IJ SOLN
INTRAMUSCULAR | Status: AC
  Filled 2021-12-21: qty 5

## 2021-12-21 MED ORDER — CEFAZOLIN SODIUM-DEXTROSE 2-4 GM/100ML-% IV SOLN
INTRAVENOUS | Status: AC
  Filled 2021-12-21: qty 100

## 2021-12-21 MED ORDER — ONDANSETRON HCL 4 MG/2ML IJ SOLN: 4.0000 mg | Freq: Four times a day (QID) | INTRAMUSCULAR | Status: DC | PRN

## 2021-12-21 MED ORDER — ACETAMINOPHEN 160 MG/5ML PO SOLN: 1000.0000 mg | Freq: Once | ORAL | Status: DC | PRN

## 2021-12-21 MED ORDER — ALBUMIN HUMAN 5 % IV SOLN: INTRAVENOUS | Status: DC | PRN

## 2021-12-21 MED ORDER — ACETAMINOPHEN 500 MG PO TABS
1000.0000 mg | ORAL_TABLET | Freq: Four times a day (QID) | ORAL | Status: AC
  Administered 2021-12-21 – 2021-12-22 (×4): 1000 mg via ORAL
  Filled 2021-12-21 (×4): qty 2

## 2021-12-21 MED ORDER — SODIUM CHLORIDE 0.9 % IV SOLN: INTRAVENOUS | Status: DC

## 2021-12-21 MED ORDER — OXYCODONE-ACETAMINOPHEN 5-325 MG PO TABS: 1.0000 | ORAL_TABLET | Freq: Four times a day (QID) | ORAL | 0 refills | Status: DC | PRN

## 2021-12-21 MED ORDER — BUPIVACAINE LIPOSOME 1.3 % IJ SUSP
INTRAMUSCULAR | Status: DC | PRN
  Administered 2021-12-21: 20 mL

## 2021-12-21 MED ORDER — BUPIVACAINE LIPOSOME 1.3 % IJ SUSP
INTRAMUSCULAR | Status: AC
  Filled 2021-12-21: qty 10

## 2021-12-21 MED ORDER — LIDOCAINE 2% (20 MG/ML) 5 ML SYRINGE
INTRAMUSCULAR | Status: DC | PRN
  Administered 2021-12-21: 60 mg via INTRAVENOUS

## 2021-12-21 SURGICAL SUPPLY — 47 items
APL SKNCLS STERI-STRIP NONHPOA (GAUZE/BANDAGES/DRESSINGS) ×1
BAG COUNTER SPONGE SURGICOUNT (BAG) IMPLANT
BAG SPEC THK2 15X12 ZIP CLS (MISCELLANEOUS)
BAG SPNG CNTER NS LX DISP (BAG)
BAG ZIPLOCK 12X15 (MISCELLANEOUS) IMPLANT
BENZOIN TINCTURE PRP APPL 2/3 (GAUZE/BANDAGES/DRESSINGS) ×1 IMPLANT
BLADE SAW SGTL 18X1.27X75 (BLADE) ×2 IMPLANT
BLADE SURG SZ10 CARB STEEL (BLADE) ×4 IMPLANT
COVER PERINEAL POST (MISCELLANEOUS) ×2 IMPLANT
COVER SURGICAL LIGHT HANDLE (MISCELLANEOUS) ×2 IMPLANT
DRAPE FOOT SWITCH (DRAPES) ×2 IMPLANT
DRAPE STERI IOBAN 125X83 (DRAPES) ×2 IMPLANT
DRAPE U-SHAPE 47X51 STRL (DRAPES) ×4 IMPLANT
DRESSING AQUACEL AG SP 3.5X6 (GAUZE/BANDAGES/DRESSINGS) IMPLANT
DRSG AQUACEL AG ADV 3.5X 6 (GAUZE/BANDAGES/DRESSINGS) ×2 IMPLANT
DRSG AQUACEL AG SP 3.5X6 (GAUZE/BANDAGES/DRESSINGS) ×2
DURAPREP 26ML APPLICATOR (WOUND CARE) ×2 IMPLANT
ELECT BLADE TIP CTD 4 INCH (ELECTRODE) ×2 IMPLANT
ELECT REM PT RETURN 15FT ADLT (MISCELLANEOUS) ×2 IMPLANT
ELIMINATOR HOLE APEX DEPUY (Hips) ×1 IMPLANT
GAUZE XEROFORM 1X8 LF (GAUZE/BANDAGES/DRESSINGS) IMPLANT
GLOVE SRG 8 PF TXTR STRL LF DI (GLOVE) ×2 IMPLANT
GLOVE SURG NEOP MICRO LF SZ7.5 (GLOVE) ×4 IMPLANT
GLOVE SURG UNDER POLY LF SZ8 (GLOVE) ×4
GOWN STRL REUS W/TWL XL LVL3 (GOWN DISPOSABLE) ×4 IMPLANT
HEAD CERAMIC 36 PLUS5 (Hips) ×1 IMPLANT
HOLDER FOLEY CATH W/STRAP (MISCELLANEOUS) ×2 IMPLANT
HOOD PEEL AWAY FLYTE STAYCOOL (MISCELLANEOUS) ×4 IMPLANT
KIT TURNOVER KIT A (KITS) IMPLANT
NEEDLE HYPO 22GX1.5 SAFETY (NEEDLE) ×2 IMPLANT
PACK ANTERIOR HIP CUSTOM (KITS) ×2 IMPLANT
PENCIL SMOKE EVACUATOR (MISCELLANEOUS) IMPLANT
PIN SECT CUP 56MM (Hips) ×1 IMPLANT
PINNACLE ALTRX PLUS 4 N 36X56 (Hips) ×1 IMPLANT
SPIKE FLUID TRANSFER (MISCELLANEOUS) ×2 IMPLANT
SPONGE T-LAP 18X18 ~~LOC~~+RFID (SPONGE) ×6 IMPLANT
STAPLER VISISTAT 35W (STAPLE) IMPLANT
STEM CORAIL KA11 (Stem) ×1 IMPLANT
STRIP CLOSURE SKIN 1/2X4 (GAUZE/BANDAGES/DRESSINGS) IMPLANT
SUT ETHIBOND NAB CT1 #1 30IN (SUTURE) ×4 IMPLANT
SUT MNCRL AB 3-0 PS2 18 (SUTURE) IMPLANT
SUT VIC AB 0 CT1 36 (SUTURE) ×2 IMPLANT
SUT VIC AB 1 CT1 36 (SUTURE) ×2 IMPLANT
SUT VIC AB 2-0 CT1 27 (SUTURE) ×2
SUT VIC AB 2-0 CT1 TAPERPNT 27 (SUTURE) ×1 IMPLANT
TAPE STRIPS DRAPE STRL (GAUZE/BANDAGES/DRESSINGS) ×1 IMPLANT
TRAY FOLEY MTR SLVR 16FR STAT (SET/KITS/TRAYS/PACK) ×2 IMPLANT

## 2021-12-21 NOTE — Anesthesia Procedure Notes (Signed)
Procedure Name: Intubation Date/Time: 12/21/2021 7:25 AM Performed by: Lollie Sails, CRNA Pre-anesthesia Checklist: Patient identified, Emergency Drugs available, Suction available, Patient being monitored and Timeout performed Patient Re-evaluated:Patient Re-evaluated prior to induction Oxygen Delivery Method: Circle system utilized Preoxygenation: Pre-oxygenation with 100% oxygen Induction Type: IV induction Ventilation: Mask ventilation without difficulty Laryngoscope Size: Miller and 3 Grade View: Grade I Tube type: Oral Tube size: 8.0 mm Number of attempts: 1 Airway Equipment and Method: Stylet Placement Confirmation: ETT inserted through vocal cords under direct vision, positive ETCO2 and breath sounds checked- equal and bilateral Secured at: 24 cm Tube secured with: Tape Dental Injury: Teeth and Oropharynx as per pre-operative assessment

## 2021-12-21 NOTE — Evaluation (Addendum)
Physical Therapy Evaluation Patient Details Name: Joshua [name] MRN: [id number] DOB: 1946-02-13 Today's Date: 12/21/2021  History of Present Illness  Patient is 76 y.o. male s/p Rt THA anterior approach on 12/21/21 with PMH significant for OA, anxiety, Afib, emphysema, HLD, RBBB, depression.    Clinical Impression  Joshua [name] is a 76 y.o. male POD 0 s/p Rt THA. Patient reports independence with mobility at baseline. Patient is now limited by functional impairments (see PT problem list below) and requires min assist for bed mobility and transfers with RW. Patient was limited to sit<>stand transfer today due to symptomatic hypotension. Patient will benefit from continued skilled PT interventions to address impairments and progress towards PLOF. Acute PT will follow to progress mobility and stair training in preparation for safe discharge home.     Vitals:          BP (mmHg): 104/78 70/47 97/69  118/79  Position: supine standing supine supine  Time 14:00 14:07 14:11 14:15        Recommendations for follow up therapy are one component of a multi-disciplinary discharge planning process, led by the attending physician.  Recommendations may be updated based on patient status, additional functional criteria and insurance authorization.  Follow Up Recommendations Follow physician's recommendations for discharge plan and follow up therapies    Assistance Recommended at Discharge Frequent or constant Supervision/Assistance  Patient can return home with the following  A little help with walking and/or transfers;A little help with bathing/dressing/bathroom;Assistance with cooking/housework;Direct supervision/assist for medications management;Assist for transportation;Help with stairs or ramp for entrance    Equipment Recommendations Rolling walker (2 wheels)  Recommendations for Other Services       Functional Status Assessment Patient has had a recent decline in their functional  status and demonstrates the ability to make significant improvements in function in a reasonable and predictable amount of time.     Precautions / Restrictions Precautions Precautions: Fall Restrictions Weight Bearing Restrictions: No Other Position/Activity Restrictions: WBAT      Mobility  Bed Mobility Overal bed mobility: Needs Assistance Bed Mobility: Supine to Sit, Sit to Supine     Supine to sit: Min guard, HOB elevated Sit to supine: Min assist   General bed mobility comments: Guarding for safety with pivot to EOB and min guard for pivot and pt taking extra time to bring LE's off EOB. Pt required min assist to bring LE's onto bed.    Transfers Overall transfer level: Needs assistance Equipment used: Rolling walker (2 wheels) Transfers: Sit to/from Stand Sit to Stand: Min assist, From elevated surface           General transfer comment: cues for hand placement with RW, assist to power up from EOB and steady in standing. pt c/o dizziness/woozy sensation. noted to be orthostatic with vitals and returned to EOB and supine.    Ambulation/Gait                  Stairs            Wheelchair Mobility    Modified Rankin (Stroke Patients Only)       Balance Overall balance assessment: Needs assistance Sitting-balance support: Feet supported Sitting balance-Leahy Scale: Good     Standing balance support: Reliant on assistive device for balance, Bilateral upper extremity supported Standing balance-Leahy Scale: Poor  Pertinent Vitals/Pain Pain Assessment Pain Assessment: 0-10 Pain Score: 3  Pain Location: Rt hip Pain Descriptors / Indicators: Aching, Discomfort Pain Intervention(s): Limited activity within patient's tolerance, Monitored during session, Repositioned    Home Living Family/patient expects to be discharged to:: Private residence Living Arrangements: Spouse/significant other Available Help  at Discharge: Family Type of Home: House Home Access: Stairs to enter Entrance Stairs-Rails: None Technical brewer of Steps: 2   Home Layout: One level Home Equipment: Conservation officer, nature (2 wheels)      Prior Function Prior Level of Function : Independent/Modified Independent                     Hand Dominance   Dominant Hand: Right    Extremity/Trunk Assessment   Upper Extremity Assessment Upper Extremity Assessment: Overall WFL for tasks assessed    Lower Extremity Assessment Lower Extremity Assessment: Overall WFL for tasks assessed    Cervical / Trunk Assessment Cervical / Trunk Assessment: Normal  Communication   Communication: No difficulties  Cognition Arousal/Alertness: Awake/alert Behavior During Therapy: WFL for tasks assessed/performed Overall Cognitive Status: Within Functional Limits for tasks assessed                                          General Comments      Exercises     Assessment/Plan    PT Assessment Patient needs continued PT services  PT Problem List Decreased strength;Decreased range of motion;Decreased activity tolerance;Decreased balance;Decreased mobility;Decreased knowledge of use of DME;Decreased knowledge of precautions;Pain       PT Treatment Interventions DME instruction;Gait training;Therapeutic activities;Functional mobility training;Stair training;Balance training;Therapeutic exercise;Patient/family education    PT Goals (Current goals can be found in the Care Plan section)  Acute Rehab PT Goals Patient Stated Goal: get home safely PT Goal Formulation: With patient Time For Goal Achievement: 12/28/21 Potential to Achieve Goals: Good    Frequency 7X/week     Co-evaluation               AM-PAC PT "6 Clicks" Mobility  Outcome Measure Help needed turning from your back to your side while in a flat bed without using bedrails?: A Little Help needed moving from lying on your back to  sitting on the side of a flat bed without using bedrails?: A Little Help needed moving to and from a bed to a chair (including a wheelchair)?: A Little Help needed standing up from a chair using your arms (e.g., wheelchair or bedside chair)?: A Little Help needed to walk in hospital room?: A Lot Help needed climbing 3-5 steps with a railing? : Total 6 Click Score: 15    End of Session Equipment Utilized During Treatment: Gait belt Activity Tolerance: Patient tolerated treatment well Patient left: in bed;with call bell/phone within reach Nurse Communication: Mobility status PT Visit Diagnosis: Muscle weakness (generalized) (M62.81);Difficulty in walking, not elsewhere classified (R26.2);Unsteadiness on feet (R26.81)    Time: 6834-1962 PT Time Calculation (min) (ACUTE ONLY): 16 min   Charges:   PT Evaluation $PT Eval Low Complexity: 1 Low          Verner Mould, DPT Acute Rehabilitation Services Office [phone] Pager [phone]   Jacques Navy 12/21/2021, 2:30 PM

## 2021-12-21 NOTE — H&P (Signed)
TOTAL HIP ADMISSION H&P  Patient is admitted for right total hip arthroplasty.  Subjective:  Chief Complaint: right hip pain  HPI: Joshua [name], 76 y.o. male, has a history of pain and functional disability in the right hip(s) due to arthritis and patient has failed non-surgical conservative treatments for greater than 12 weeks to include NSAID's and/or analgesics, use of assistive devices, weight reduction as appropriate, and activity modification.  Onset of symptoms was gradual starting 3 years ago with gradually worsening course since that time.The patient noted no past surgery on the right hip(s).  Patient currently rates pain in the right hip at 9 out of 10 with activity. Patient has night pain, worsening of pain with activity and weight bearing, trendelenberg gait, pain that interfers with activities of daily living, and pain with passive range of motion. Patient has evidence of subchondral cysts, subchondral sclerosis, joint subluxation, and joint space narrowing by imaging studies. This condition presents safety issues increasing the risk of falls. This patient has had  failure of all reasonable conservative care .  There is no current active infection.  Patient Active Problem List   Diagnosis Date Noted   PAF (paroxysmal atrial fibrillation) ([place]) 12/13/2021   Primary osteoarthritis of right hip 10/26/2021   Abnormal liver function tests 09/10/2021   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   Urinary hesitancy 03/08/2021   Thoracic aortic aneurysm without rupture 03/08/2021   Kidney stone    Senile purpura ([place]) 09/07/2020   Aortic atherosclerosis ([place]) 09/07/2020   Sinus bradycardia 03/31/2020   Essential hypertension 03/31/2020   Major depressive disorder 03/07/2020   Essential tremor 03/07/2020   Tremor 01/21/2020   Obesity (BMI 30-39.9) 12/10/2019   Chest pain of uncertain etiology 53/29/9242   Secondary hypercoagulable state ([place]) 09/28/2019   Centrilobular emphysema ([place]) 09/23/2019    Former smoker 09/23/2019   Abnormal findings on diagnostic imaging of lung 09/23/2019   Atrial fibrillation ([place]) 09/23/2019   At risk for sleep apnea 09/23/2019   Shortness of breath 09/23/2019   Nontraumatic incomplete tear of right rotator cuff 10/14/2018   History of adenomatous polyp of colon 03/03/2018   RBBB 06/09/2012   Nephrolithiasis 05/22/2011   Mixed hyperlipidemia 11/20/2009   DDD (degenerative disc disease), cervical 05/13/2008   Past Medical History:  Diagnosis Date   Abnormal findings on diagnostic imaging of lung 09/23/2019   09/22/2019-lung cancer screening-centrilobular and paraseptal emphysema, calcified granulomata noted bilaterally, no suspicious nodules, lung RADS 1, repeat in 12 months    Anxiety    Atrial fibrillation ([place]) 09/23/2019   Centrilobular emphysema ([place]) 09/23/2019   09/22/2019-lung cancer screening-centrilobular and paraseptal emphysema, calcified granulomata noted bilaterally, no suspicious nodules, lung RADS 1, repeat in 12 months  09/23/2019-FVC 3.74 (81% predicted), postbronchodilator ratio 80, postbronchodilator FEV1 2.88 (86% predicted), no bronchodilator response, DLCO 19.44 (73% predicted)   DDD (degenerative disc disease), cervical 05/13/2008   Cervical Disc Degeneration   Depression    History of adenomatous polyp of colon 03/03/2018   05/14/17: Colonoscopy: nonadvanced adenoma, f/u 5 yrs, use SuPrep, Murphy/GAP  Last Assessment & Plan:  Relevant Hx: Course: Daily Update: Today's Plan:   Hyperlipidemia    Kidney stone    Nephrolithiasis 05/22/2011   Nontraumatic incomplete tear of right rotator cuff 10/14/2018   Other and unspecified hyperlipidemia 11/20/2009   Hyperlipidemia   RBBB 06/09/2012   Secondary hypercoagulable state ([place]) 09/28/2019   Shortness of breath 09/23/2019    Past Surgical History:  Procedure Laterality Date   CARDIOVERSION N/A  10/15/2019   Procedure: CARDIOVERSION;  Surgeon: Jerline Pain, MD;  Location: [facility]  ENDOSCOPY;  Service: Cardiovascular;  Laterality: N/A;   CARDIOVERSION N/A 11/29/2019   Procedure: CARDIOVERSION;  Surgeon: Dorothy Spark, MD;  Location: [facility] ENDOSCOPY;  Service: Cardiovascular;  Laterality: N/A;   HEMORRHOID SURGERY     VARICOSE VEIN SURGERY Right     Current Facility-Administered Medications  Medication Dose Route Frequency Provider Last Rate Last Admin   bupivacaine liposome (EXPAREL) 1.3 % injection 133 mg  10 mL Other Once Dorna Leitz, MD       ceFAZolin (ANCEF) IVPB 2g/100 mL premix  2 g Intravenous On Call to OR Dorna Leitz, MD       lactated ringers infusion   Intravenous Continuous Audry Pili, MD 10 mL/hr at 12/21/21 0550 New Bag at 12/21/21 0550   tranexamic acid (CYKLOKAPRON) IVPB 1,000 mg  1,000 mg Intravenous To OR Dorna Leitz, MD       Allergies  Allergen Reactions   Venlafaxine Palpitations   Bupropion Other (See Comments)    unknown   Tramadol Other (See Comments)    Muscle spasm   Zoloft [Sertraline Hcl] Other (See Comments)    Tick    Social History   Tobacco Use   Smoking status: Former    Packs/day: 2.00    Years: 46.00    Pack years: 92.00    Types: Cigarettes    Quit date: 2010    Years since quitting: 13.1   Smokeless tobacco: Never  Substance Use Topics   Alcohol use: Not Currently    Alcohol/week: 14.0 standard drinks    Types: 14 Cans of beer per week    Family History  Problem Relation Age of Onset   Varicose Veins Mother    Stroke Father      Review of Systems ROS: I have reviewed the patient's review of systems thoroughly and there are no positive responses as relates to the HPI.  Objective:  Physical Exam  Vital signs in last 24 hours: Temp:  [97.8 F (36.6 C)] 97.8 F (36.6 C) (02/17 0541) Pulse Rate:  [84] 84 (02/17 0541) Resp:  [18] 18 (02/17 0541) BP: (156)/(94) 156/94 (02/17 0541) SpO2:  [100 %] 100 % (02/17 0541) Weight:  [100.7 kg] 100.7 kg (02/17 0558) Well-developed well-nourished patient  in no acute distress. Alert and oriented x3 HEENT:within normal limits Cardiac: Regular rate and rhythm Pulmonary: Lungs clear to auscultation Abdomen: Soft and nontender.  Normal active bowel sounds  Musculoskeletal: (Right hip: Painful range of motion.  Limited range of motion.  Pain with internal rotation.  Neurovascular intact distally. Labs: Recent Results (from the past 2160 hour(s))  CBC per protocol     Status: Abnormal   Collection Time: 12/14/21  8:37 AM  Result Value Ref Range   WBC 4.0 4.0 - 10.5 K/uL   RBC 3.86 (L) 4.22 - 5.81 MIL/uL   Hemoglobin 12.9 (L) 13.0 - 17.0 g/dL   HCT 39.0 39.0 - 52.0 %   MCV 101.0 (H) 80.0 - 100.0 fL   MCH 33.4 26.0 - 34.0 pg   MCHC 33.1 30.0 - 36.0 g/dL   RDW 13.4 11.5 - 15.5 %   Platelets 271 150 - 400 K/uL   nRBC 0.0 0.0 - 0.2 %    Comment: Performed at [facility], [place] [address], [place], [place] [address]  Basic metabolic panel per protocol     Status: Abnormal   Collection Time: 12/14/21  8:37 AM  Result Value Ref Range   Sodium 140 135 - 145 mmol/L   Potassium 4.3 3.5 - 5.1 mmol/L   Chloride 106 98 - 111 mmol/L   CO2 26 22 - 32 mmol/L   Glucose, Bld 106 (H) 70 - 99 mg/dL    Comment: Glucose reference range applies only to samples taken after fasting for at least 8 hours.   BUN 24 (H) 8 - 23 mg/dL   Creatinine, Ser 1.25 (H) 0.61 - 1.24 mg/dL   Calcium 9.1 8.9 - 10.3 mg/dL   GFR, Estimated >60 >60 mL/min    Comment: (NOTE) Calculated using the CKD-EPI Creatinine Equation (2021)    Anion gap 8 5 - 15    Comment: Performed at [facility], [place] [address], [place], [place] [address]  Surgical PCR Screen     Status: None   Collection Time: 12/14/21  8:37 AM   Specimen: Nasal Mucosa; Nasal Swab  Result Value Ref Range   MRSA, PCR NEGATIVE NEGATIVE   Staphylococcus aureus NEGATIVE NEGATIVE    Comment: (NOTE) The Xpert SA Assay (FDA approved for NASAL specimens in patients 65 years of age  and older), is one component of a comprehensive surveillance program. It is not intended to diagnose infection nor to guide or monitor treatment. Performed at [facility], [place] [address] [place], [place] [address]   Type and screen Order type and screen if day of surgery is less than 15 days from draw of preadmission visit or order morning of surgery if day of surgery is greater than 6 days from preadmission visit.     Status: None   Collection Time: 12/14/21  8:37 AM  Result Value Ref Range   ABO/RH(D) A POS    Antibody Screen NEG    Sample Expiration 12/28/2021,2359    Extend sample reason      NO TRANSFUSIONS OR PREGNANCY IN THE PAST 3 MONTHS Performed at [facility], [place] [address] [place], [place] [address]      Estimated body mass index is 31.85 kg/m as calculated from the following:   Height as of this encounter: 5\' 10"  (1.778 m).   Weight as of this encounter: 100.7 kg.   Imaging Review Plain radiographs demonstrate severe degenerative joint disease of the right hip(s). The bone quality appears to be good for age and reported activity level.      Assessment/Plan:  End stage arthritis, right hip(s)  The patient history, physical examination, clinical judgement of the provider and imaging studies are consistent with end stage degenerative joint disease of the right hip(s) and total hip arthroplasty is deemed medically necessary. The treatment options including medical management, injection therapy, arthroscopy and arthroplasty were discussed at length. The risks and benefits of total hip arthroplasty were presented and reviewed. The risks due to aseptic loosening, infection, stiffness, dislocation/subluxation,  thromboembolic complications and other imponderables were discussed.  The patient acknowledged the explanation, agreed to proceed with the plan and consent was signed. Patient is being admitted for inpatient treatment for  surgery, pain control, PT, OT, prophylactic antibiotics, VTE prophylaxis, progressive ambulation and ADL's and discharge planning.The patient is planning to be discharged home with home health services

## 2021-12-21 NOTE — Transfer of Care (Signed)
Immediate Anesthesia Transfer of Care Note  Patient: Kade Demicco Frink III  Procedure(s) Performed: TOTAL HIP ARTHROPLASTY ANTERIOR APPROACH (Right: Hip)  Patient Location: PACU  Anesthesia Type:General  Level of Consciousness: awake, alert  and patient cooperative  Airway & Oxygen Therapy: Patient Spontanous Breathing and Patient connected to face mask oxygen  Post-op Assessment: Report given to RN and Post -op Vital signs reviewed and stable  Post vital signs: Reviewed and stable  Last Vitals:  Vitals Value Taken Time  BP 143/77 12/21/21 0949  Temp 36.9 C 12/21/21 0949  Pulse 81 12/21/21 0952  Resp 18 12/21/21 0952  SpO2 96 % 12/21/21 0952  Vitals shown include unvalidated device data.  Last Pain:  Vitals:   12/21/21 0949  TempSrc:   PainSc: 0-No pain      Patients Stated Pain Goal: 3 (37/85/88 5027)  Complications: No notable events documented.

## 2021-12-21 NOTE — Discharge Instructions (Signed)

## 2021-12-21 NOTE — Op Note (Signed)
PATIENT ID:      Joshua [name]  MRN:     [id number] DOB/AGE:    Nov 21, 1945 / 76 y.o.       OPERATIVE REPORT    DATE OF PROCEDURE:  12/21/2021       PREOPERATIVE DIAGNOSIS:  RIGHT HIP DEGENERATIVE JOINT DISEASE                                                       Estimated body mass index is 31.85 kg/m as calculated from the following:   Height as of this encounter: 5\' 10"  (1.778 m).   Weight as of this encounter: 100.7 kg.     POSTOPERATIVE DIAGNOSIS:  right hip degenerative joint disease                                                           PROCEDURE:  1. right total hip arthroplasty using a 56 mm DePuy Pinnacle  Cup, [employer],  +4 neutral liner, a +5 36 mm ceramic head,  and a #11  Corail stem, 2.interpretation of multiple intraoperative fluoroscopic images   SURGEON: Alta Corning    ASSISTANT:   Gaspar Skeeters PA-C  (present throughout entire procedure and necessary for timely completion of the procedure)  ANESTHESIA: general  BLOOD LOSS: 100CC Tranexamic Acid: 1 gram IV DRAINS: None COMPLICATIONS: None    NDICATIONS FOR PROCEDURE:Patient with end-stage arthritis of the right hip.  X-rays show bone-on-bone arthritic changes. Despite conservative measures with observation, anti-inflammatory medicine, narcotics, use of a cane, has severe unremitting pain and can ambulate only less than 1 block before resting.  Patient desires elective right total hip arthroplasty to decrease pain and increase function. The risks, benefits, and alternatives were discussed at length including but not limited to the risks of infection, bleeding, nerve injury, stiffness, blood clots, the need for revision surgery, cardiopulmonary complications, among others, and they were willing to proceed.Benefits have been discussed. Questions answered.     PROCEDURE IN DETAIL: The patient was identified by armband,  received preoperative IV antibiotics in the holding area at [facility], taken to the operating room , appropriate anesthetic monitors  were attached and spinal anesthesia was induced.  The patient was placed onto the hot bed and all bony prominences were well-padded.The right hip was prepped and draped for an anterior approach to the hip.  An incision was made and the subcutaneous dissection was down to the level of the tensor fascia.  The fascia was opened and finger dissected.  The bleeders coming across the anterior portion of the hip were identified and cauterized. Retractors were put in place above and below the femoral neck.  The capsule was opened and tagged and a provisional neck cut was made.  The head was removed and sized on the back table.  The acetabulum was sequentially reamed to a level of 67mm and a 56 mm porous-coated pinnacle cup was hammered into place with 45 of lateral opening and 30 of anteversion.fluoroscopy was used to ensure this position of the cup.  A +4 neutral liner was placed in the cup  before returning to the femur.  Attention was turned towards the femur where the leg was actually rotated, extended, and adduction did.  The femur was sequentially broached until a size of 11broach gave a perfect fit and fill.at this point a  +5 mm delta ceramic hip ball was placed and the hip reduced.  Fluoroscopic images were taken to assess the leg length, fit and fill of the stem, and cup position.  We were happy with the construct at this point.  The 11 broach was removed and a final Corrail stem with standard offset  and a +6mm ceramic hip ball was placed and reduced.  Final images were taken to make certain there were happy with the position at this point.   The capsule was closed with #1 Vicryl suture.  The tensor fascia was closed with 0 Vicryl suture.  The skin was then closed with combination of 0 and 2-0 Vicryl suture.  The top layer was with 3-0 Monocryl suture.  Benzoin and Steri-Strips were applied  and a sterile compressive dressing was  applied and the patient taken to recovery room she noted be in satisfactory condition.  Past medical Motion for the procedure was approximately 300 cc.  Of note Gaspar Skeeters was with the entire case and assisted by retraction of tissues, manipulation of the leg, and closing the minimize or time.    Alta Corning 04/29/2021, 6:15 PM  Alta Corning 12/21/2021, 1:13 PM

## 2021-12-22 DIAGNOSIS — M1611 Unilateral primary osteoarthritis, right hip: Secondary | ICD-10-CM | POA: Diagnosis not present

## 2021-12-22 DIAGNOSIS — E669 Obesity, unspecified: Secondary | ICD-10-CM | POA: Diagnosis not present

## 2021-12-22 DIAGNOSIS — Z87891 Personal history of nicotine dependence: Secondary | ICD-10-CM | POA: Diagnosis not present

## 2021-12-22 DIAGNOSIS — I2510 Atherosclerotic heart disease of native coronary artery without angina pectoris: Secondary | ICD-10-CM | POA: Diagnosis not present

## 2021-12-22 DIAGNOSIS — Z6831 Body mass index (BMI) 31.0-31.9, adult: Secondary | ICD-10-CM | POA: Diagnosis not present

## 2021-12-22 DIAGNOSIS — I10 Essential (primary) hypertension: Secondary | ICD-10-CM | POA: Diagnosis not present

## 2021-12-22 DIAGNOSIS — Z79899 Other long term (current) drug therapy: Secondary | ICD-10-CM | POA: Diagnosis not present

## 2021-12-22 LAB — CBC
HCT: 31.8 % — ABNORMAL LOW (ref 39.0–52.0)
Hemoglobin: 10.2 g/dL — ABNORMAL LOW (ref 13.0–17.0)
MCH: 33.7 pg (ref 26.0–34.0)
MCHC: 32.1 g/dL (ref 30.0–36.0)
MCV: 105 fL — ABNORMAL HIGH (ref 80.0–100.0)
Platelets: 247 10*3/uL (ref 150–400)
RBC: 3.03 MIL/uL — ABNORMAL LOW (ref 4.22–5.81)
RDW: 13.8 % (ref 11.5–15.5)
WBC: 13.2 10*3/uL — ABNORMAL HIGH (ref 4.0–10.5)
nRBC: 0 % (ref 0.0–0.2)

## 2021-12-22 MED ORDER — APIXABAN 5 MG PO TABS
5.0000 mg | ORAL_TABLET | Freq: Two times a day (BID) | ORAL | Status: DC
  Administered 2021-12-22: 5 mg via ORAL
  Filled 2021-12-22: qty 1

## 2021-12-22 NOTE — Progress Notes (Signed)
The patient is alert and oriented and has been seen by his. The orders for discharge were written. IV has been removed. Went over discharge instructions with patient and family. He is being discharged via wheelchair with all of his belongings.

## 2021-12-22 NOTE — Discharge Summary (Signed)
Patient ID: Joshua [name] MRN: [id number] DOB/AGE: 74-May-1947 74 y.o.  Admit date: 12/21/2021 Discharge date: 12/22/2021  Admission Diagnoses:  Principal Problem:   Primary osteoarthritis of right hip   Discharge Diagnoses:  Same  Past Medical History:  Diagnosis Date   Abnormal findings on diagnostic imaging of lung 09/23/2019   09/22/2019-lung cancer screening-centrilobular and paraseptal emphysema, calcified granulomata noted bilaterally, no suspicious nodules, lung RADS 1, repeat in 12 months    Anxiety    Atrial fibrillation ([place]) 09/23/2019   Centrilobular emphysema ([place]) 09/23/2019   09/22/2019-lung cancer screening-centrilobular and paraseptal emphysema, calcified granulomata noted bilaterally, no suspicious nodules, lung RADS 1, repeat in 12 months  09/23/2019-FVC 3.74 (81% predicted), postbronchodilator ratio 80, postbronchodilator FEV1 2.88 (86% predicted), no bronchodilator response, DLCO 19.44 (73% predicted)   DDD (degenerative disc disease), cervical 05/13/2008   Cervical Disc Degeneration   Depression    History of adenomatous polyp of colon 03/03/2018   05/14/17: Colonoscopy: nonadvanced adenoma, f/u 5 yrs, use SuPrep, Murphy/GAP  Last Assessment & Plan:  Relevant Hx: Course: Daily Update: Today's Plan:   Hyperlipidemia    Kidney stone    Nephrolithiasis 05/22/2011   Nontraumatic incomplete tear of right rotator cuff 10/14/2018   Other and unspecified hyperlipidemia 11/20/2009   Hyperlipidemia   RBBB 06/09/2012   Secondary hypercoagulable state ([place]) 09/28/2019   Shortness of breath 09/23/2019    Surgeries: Procedure(s): TOTAL HIP ARTHROPLASTY ANTERIOR APPROACH on 12/21/2021   Consultants:   Discharged Condition: Improved  Hospital Course: Joshua [name] is an 76 y.o. male who was admitted 12/21/2021 for operative treatment ofPrimary osteoarthritis of right hip. Patient has severe unremitting pain that affects sleep, daily activities, and  work/hobbies. After pre-op clearance the patient was taken to the operating room on 12/21/2021 and underwent  Procedure(s): TOTAL HIP ARTHROPLASTY ANTERIOR APPROACH.    Patient was given perioperative antibiotics:  Anti-infectives (From admission, onward)    Start     Dose/Rate Route Frequency Ordered Stop   12/21/21 1330  ceFAZolin (ANCEF) IVPB 2g/100 mL premix        2 g 200 mL/hr over 30 Minutes Intravenous  Once 12/21/21 1005 12/21/21 1458   12/21/21 1306  ceFAZolin (ANCEF) 2-4 GM/100ML-% IVPB       Note to Pharmacy: Melody Haver K: cabinet override      12/21/21 1306 12/22/21 0114   12/21/21 0600  ceFAZolin (ANCEF) IVPB 2g/100 mL premix        2 g 200 mL/hr over 30 Minutes Intravenous On call to O.R. 12/21/21 6213 12/21/21 0747        Patient was given sequential compression devices, early ambulation, and chemoprophylaxis to prevent DVT.  Patient benefited maximally from hospital stay and there were no complications.    Recent vital signs: Patient Vitals for the past 24 hrs:  BP Temp Temp src Pulse Resp SpO2  12/22/21 0510 114/67 98.4 F (36.9 C) Oral 64 17 92 %  12/22/21 0103 127/89 98 F (36.7 C) Oral 73 16 99 %  12/21/21 2250 -- -- -- 91 -- --  12/21/21 2028 122/73 97.8 F (36.6 C) Oral (!) 101 18 95 %  12/21/21 1702 (!) 125/91 (!) 97.5 F (36.4 C) Oral 91 18 97 %  12/21/21 1615 103/79 -- -- -- -- --  12/21/21 1600 117/84 -- -- -- -- --  12/21/21 1545 112/73 -- -- -- -- --  12/21/21 1530 123/87 -- -- -- -- --  12/21/21 1515 119/88 -- -- -- -- --  12/21/21 1500 114/85 -- -- -- -- --  12/21/21 1445 (!) 179/77 -- -- -- -- --  12/21/21 1430 111/75 -- -- -- -- --  12/21/21 1415 118/79 -- -- -- -- --  12/21/21 1400 104/78 -- -- 96 -- 93 %  12/21/21 1345 116/76 -- -- 93 -- 91 %  12/21/21 1330 121/76 -- -- 94 -- 90 %  12/21/21 1315 119/80 -- -- 94 -- (!) 88 %  12/21/21 1245 116/79 -- -- 96 -- 90 %  12/21/21 1230 103/84 -- -- 97 -- 95 %  12/21/21 1215 100/74 -- --  94 (!) 9 97 %  12/21/21 1200 107/65 -- -- 91 (!) 9 94 %  12/21/21 1145 113/73 97.6 F (36.4 C) -- 92 (!) 9 93 %  12/21/21 1130 100/71 -- -- 91 10 92 %  12/21/21 1123 -- 97.6 F (36.4 C) -- 94 20 (!) 89 %  12/21/21 1115 112/72 -- -- 91 19 92 %  12/21/21 1110 -- -- -- 88 10 96 %  12/21/21 1100 113/75 -- -- 88 (!) 9 97 %  12/21/21 1059 113/75 97.7 F (36.5 C) -- 88 15 94 %  12/21/21 1050 -- -- -- 89 13 98 %  12/21/21 1045 117/74 -- -- 89 13 98 %  12/21/21 1030 104/71 97.7 F (36.5 C) -- 88 13 92 %  12/21/21 1025 -- -- -- 90 17 (!) 88 %  12/21/21 1015 105/86 -- -- 77 16 (!) 89 %  12/21/21 1010 -- -- -- 77 12 97 %  12/21/21 1001 120/72 -- -- 84 16 96 %  12/21/21 1000 120/72 -- -- 84 (!) 21 97 %  12/21/21 0950 -- -- -- 83 13 97 %  12/21/21 0949 (!) 143/77 98.5 F (36.9 C) -- -- 18 --     Recent laboratory studies:  Recent Labs    12/21/21 1033 12/22/21 0414  WBC 12.7* 13.2*  HGB 9.9* 10.2*  HCT 30.3* 31.8*  PLT 237 247     Discharge Medications:   Allergies as of 12/22/2021       Reactions   Venlafaxine Palpitations   Bupropion Other (See Comments)   unknown   Tramadol Other (See Comments)   Muscle spasm   Zoloft [sertraline Hcl] Other (See Comments)   Tick        Medication List     STOP taking these medications    cyclobenzaprine 10 MG tablet Commonly known as: FLEXERIL   HYDROcodone-acetaminophen 5-325 MG tablet Commonly known as: NORCO/VICODIN       TAKE these medications    amiodarone 200 MG tablet Commonly known as: PACERONE TAKE 1/2 TABLET BY MOUTH DAILY   atorvastatin 40 MG tablet Commonly known as: LIPITOR TAKE ONE TABLET BY MOUTH DAILY   clobetasol 0.05 % external solution Commonly known as: TEMOVATE Apply 1 application topically once a week. Use in hair   docusate sodium 100 MG capsule Commonly known as: Colace Take 1 capsule (100 mg total) by mouth 2 (two) times daily.   Eliquis 5 MG Tabs tablet Generic drug: apixaban Take 5  mg by mouth 2 (two) times daily.   fluorouracil 5 % cream Commonly known as: EFUDEX Apply 1 application topically daily as needed (On head over night).   FLUoxetine 20 MG capsule Commonly known as: PROZAC Take 20 mg by mouth daily.   ketoconazole 2 % shampoo Commonly known as: NIZORAL Apply 1 application topically 2 (two) times a week. Monday and Thursday  lamoTRIgine 200 MG tablet Commonly known as: LAMICTAL Take 100 mg by mouth 2 (two) times daily.   oxyCODONE-acetaminophen 5-325 MG tablet Commonly known as: PERCOCET/ROXICET Take 1-2 tablets by mouth every 6 (six) hours as needed for severe pain. What changed:  how much to take when to take this reasons to take this   polyethylene glycol powder 17 GM/SCOOP powder Commonly known as: GLYCOLAX/MIRALAX Take 17 g by mouth 2 (two) times daily.   tamsulosin 0.4 MG Caps capsule Commonly known as: FLOMAX Take 0.4 mg by mouth every other day.   tiZANidine 2 MG tablet Commonly known as: ZANAFLEX Take 1 tablet (2 mg total) by mouth every 8 (eight) hours as needed for muscle spasms.   tolnaftate 1 % cream Commonly known as: TINACTIN Apply 1 application topically daily as needed (Fungus around toes).   vitamin B-6 25 MG tablet Commonly known as: pyridOXINE Take 100 mg by mouth daily.               Durable Medical Equipment  (From admission, onward)           Start     Ordered   12/21/21 1816  DME Walker rolling  Once       Question:  Patient needs a walker to treat with the following condition  Answer:  Primary osteoarthritis of right hip   12/21/21 1815   12/21/21 1816  DME 3 n 1  Once        12/21/21 1815              Discharge Care Instructions  (From admission, onward)           Start     Ordered   12/22/21 0000  Weight bearing as tolerated        12/22/21 0845            Diagnostic Studies: DG Chest 2 View  Result Date: 12/16/2021 CLINICAL DATA:  Preop chest radiograph. EXAM:  CHEST - 2 VIEW COMPARISON:  Chest radiograph dated 09/23/2019. FINDINGS: No focal consolidation, pleural effusion, pneumothorax. The cardiac silhouette is within limits. Atherosclerotic calcification of the aortic arch. Degenerative changes of the spine. No acute osseous pathology. IMPRESSION: No active cardiopulmonary disease. Electronically Signed   By: Anner Crete M.D.   On: 12/16/2021 02:46   DG C-Arm 1-60 Min-No Report  Result Date: 12/21/2021 Fluoroscopy was utilized by the requesting physician.  No radiographic interpretation.   DG C-Arm 1-60 Min-No Report  Result Date: 12/21/2021 Fluoroscopy was utilized by the requesting physician.  No radiographic interpretation.   DG HIP UNILAT WITH PELVIS 1V RIGHT  Result Date: 12/21/2021 CLINICAL DATA:  Right hip arthroplasty EXAM: DG HIP (WITH OR WITHOUT PELVIS) 1V RIGHT COMPARISON:  Radiograph 11/23/2021 FINDINGS: Intraoperative images during right hip arthroplasty. Normal alignment without evidence of loosening or periprosthetic fracture. IMPRESSION: Intraoperative images during right hip arthroplasty. No evidence of immediate hardware complication. Electronically Signed   By: Maurine Simmering M.D.   On: 12/21/2021 09:50   DG HIP UNILAT W OR W/O PELVIS 2-3 VIEWS RIGHT  Result Date: 11/23/2021 CLINICAL DATA:  Right hip pain for a month without injury. EXAM: DG HIP (WITH OR WITHOUT PELVIS) 2-3V RIGHT COMPARISON:  None. FINDINGS: No fracture or dislocation. Degenerative changes are seen in both hips without loss of joint space on the left. There appears to be central loss of joint space on the right. No other abnormalities. IMPRESSION: 1. Degenerative changes in both hips without loss of  joint space on the left. There is apparent loss of joint space centrally on the right. No other abnormalities. Electronically Signed   By: Dorise Bullion [name] M.D.   On: 11/23/2021 12:07    Disposition: Discharge disposition: 01-Home or Self Care       Discharge  Instructions     Call MD / Call 911   Complete by: As directed    If you experience chest pain or shortness of breath, CALL 911 and be transported to the hospital emergency room.  If you develope a fever above 101 F, pus (white drainage) or increased drainage or redness at the wound, or calf pain, call your surgeon's office.   Constipation Prevention   Complete by: As directed    Drink plenty of fluids.  Prune juice may be helpful.  You may use a stool softener, such as Colace (over the counter) 100 mg twice a day.  Use MiraLax (over the counter) for constipation as needed.   Diet - low sodium heart healthy   Complete by: As directed    Driving restrictions   Complete by: As directed    No driving for 2 weeks   Increase activity slowly as tolerated   Complete by: As directed    Patient may shower   Complete by: As directed    You may shower without a dressing once there is no drainage.  Do not wash over the wound.  If drainage remains, cover wound with plastic wrap and then shower.   Post-operative opioid taper instructions:   Complete by: As directed    POST-OPERATIVE OPIOID TAPER INSTRUCTIONS: It is important to wean off of your opioid medication as soon as possible. If you do not need pain medication after your surgery it is ok to stop day one. Opioids include: Codeine, Hydrocodone(Norco, Vicodin), Oxycodone(Percocet, oxycontin) and hydromorphone amongst others.  Long term and even short term use of opiods can cause: Increased pain response Dependence Constipation Depression Respiratory depression And more.  Withdrawal symptoms can include Flu like symptoms Nausea, vomiting And more Techniques to manage these symptoms Hydrate well Eat regular healthy meals Stay active Use relaxation techniques(deep breathing, meditating, yoga) Do Not substitute Alcohol to help with tapering If you have been on opioids for less than two weeks and do not have pain than it is ok to stop all  together.  Plan to wean off of opioids This plan should start within one week post op of your joint replacement. Maintain the same interval or time between taking each dose and first decrease the dose.  Cut the total daily intake of opioids by one tablet each day Next start to increase the time between doses. The last dose that should be eliminated is the evening dose.      Weight bearing as tolerated   Complete by: As directed         Follow-up Information     Dorna Leitz, MD. Go on 01/03/2022.   Specialty: Orthopedic Surgery Why: Your appointment has been scheduled for 10:00 Contact information: [place] [id number] [phone]         [facility] [place]. Go on 12/24/2021.   Why: Your appointment has been scheduled for 11:45. Please arrive a few minutes early to complete your paperwork Contact information: [phone]                 Signed: Joanell Rising 12/22/2021, 8:46 AM

## 2021-12-22 NOTE — Progress Notes (Signed)
Physical Therapy Treatment Patient Details Name: Joshua [name] MRN: [id number] DOB: [birth year] Today's Date: 12/22/2021   History of Present Illness Patient is 76 y.o. male s/p Rt THA anterior approach on 12/21/21 with PMH significant for OA, anxiety, Afib, emphysema, HLD, RBBB, depression.    PT Comments    Pt ambulated in hallway, practiced safe stair technique and performed LE exercises.  Pt provided with stair and HEP handouts and had no further questions.  Pt feels ready for d/c home today.    Recommendations for follow up therapy are one component of a multi-disciplinary discharge planning process, led by the attending physician.  Recommendations may be updated based on patient status, additional functional criteria and insurance authorization.  Follow Up Recommendations  Follow physician's recommendations for discharge plan and follow up therapies     Assistance Recommended at Discharge Frequent or constant Supervision/Assistance  Patient can return home with the following A little help with walking and/or transfers;A little help with bathing/dressing/bathroom;Assistance with cooking/housework;Direct supervision/assist for medications management;Assist for transportation;Help with stairs or ramp for entrance   Equipment Recommendations  Rolling walker (2 wheels)    Recommendations for Other Services       Precautions / Restrictions Precautions Precautions: Fall Restrictions Other Position/Activity Restrictions: WBAT     Mobility  Bed Mobility Overal bed mobility: Needs Assistance Bed Mobility: Supine to Sit     Supine to sit: Supervision, HOB elevated          Transfers Overall transfer level: Needs assistance Equipment used: Rolling walker (2 wheels) Transfers: Sit to/from Stand Sit to Stand: Min guard, Supervision           General transfer comment: verbal cues for hand placement    Ambulation/Gait Ambulation/Gait assistance: Min guard,  Supervision Gait Distance (Feet): 200 Feet Assistive device: Rolling walker (2 wheels) Gait Pattern/deviations: Step-through pattern, Decreased stride length, Antalgic       General Gait Details: verbal cues for step length and RW positioning   Stairs Stairs: Yes Stairs assistance: Min guard Stair Management: Step to pattern, Backwards, With walker Number of Stairs: 3 General stair comments: verbal cues for sequence, RW positioning, safety; pt performed once and reports understanding   Wheelchair Mobility    Modified Rankin (Stroke Patients Only)       Balance                                            Cognition Arousal/Alertness: Awake/alert Behavior During Therapy: WFL for tasks assessed/performed Overall Cognitive Status: Within Functional Limits for tasks assessed                                          Exercises Total Joint Exercises Ankle Circles/Pumps: AROM, Both, 10 reps Quad Sets: AROM, Both, 10 reps Heel Slides: 10 reps, Right, AAROM Hip ABduction/ADduction: AAROM, Right, 10 reps, Standing, Supine Long Arc Quad: AROM, Right, Seated, 10 reps Knee Flexion: AROM, Right, Standing, 10 reps Marching in Standing: AROM, Right, Standing, 10 reps Standing Hip Extension: AROM, Right, Standing, 10 reps    General Comments        Pertinent Vitals/Pain Pain Assessment Pain Assessment: 0-10 Pain Score: 5  Pain Location: Rt hip Pain Descriptors / Indicators: Aching, Discomfort, Sore Pain Intervention(s): Repositioned, Monitored during session  Home Living                          Prior Function            PT Goals (current goals can now be found in the care plan section) Progress towards PT goals: Progressing toward goals    Frequency    7X/week      PT Plan Current plan remains appropriate    Co-evaluation              AM-PAC PT "6 Clicks" Mobility   Outcome Measure  Help needed  turning from your back to your side while in a flat bed without using bedrails?: A Little Help needed moving from lying on your back to sitting on the side of a flat bed without using bedrails?: A Little Help needed moving to and from a bed to a chair (including a wheelchair)?: A Little Help needed standing up from a chair using your arms (e.g., wheelchair or bedside chair)?: A Little Help needed to walk in hospital room?: A Little Help needed climbing 3-5 steps with a railing? : A Little 6 Click Score: 18    End of Session Equipment Utilized During Treatment: Gait belt Activity Tolerance: Patient tolerated treatment well Patient left: with call bell/phone within reach;in chair Nurse Communication: Mobility status PT Visit Diagnosis: Muscle weakness (generalized) (M62.81);Difficulty in walking, not elsewhere classified (R26.2);Unsteadiness on feet (R26.81)     Time: 8101-7510 PT Time Calculation (min) (ACUTE ONLY): 31 min  Charges:  $Gait Training: 8-22 mins $Therapeutic Exercise: 8-22 mins                     {Kati PT, DPT Acute Rehabilitation Services Pager: [phone] Office: [place] 12/22/2021, 1:53 PM

## 2021-12-22 NOTE — TOC Transition Note (Signed)
Transition of Care [facility]) - CM/SW Discharge Note  Patient Details  Name: Joshua [name] [facility] MRN: [id number] Date of Birth: 24-Apr-1946  Transition of Care [facility]) CM/SW Contact:  Sherie Don, LCSW Phone Number: 12/22/2021, 11:39 AM  Clinical Narrative: Patient is expected to discharge home after working with PT. CSW spoke with patient's wife regarding discharge plan. Patient will go home with OPPT at [facility] with the first appointment scheduled for 12/24/21. Wife reported they have an elevated toilet at home and the patient had become unsteady at home so she already bought a rolling walker. Patient will not need any DME at this time. TOC signing off.  Final next level of care: OP Rehab Barriers to Discharge: No Barriers Identified  Patient Goals and CMS Choice Patient states their goals for this hospitalization and ongoing recovery are:: Discharge home with OPPT at [facility] Choice offered to / list presented to : NA  Discharge Plan and Services       DME Arranged: N/A DME Agency: NA  Readmission Risk Interventions No flowsheet data found.

## 2021-12-22 NOTE — Plan of Care (Signed)
  Problem: Education: Goal: Knowledge of General Education information will improve Description Including pain rating scale, medication(s)/side effects and non-pharmacologic comfort measures Outcome: Progressing   

## 2021-12-22 NOTE — Progress Notes (Signed)
PATIENT ID: Joshua [name]  MRN: [id number]  DOB/AGE:  [birth year] / 76 y.o.  1 Day Post-Op Procedure(s) (LRB): TOTAL HIP ARTHROPLASTY ANTERIOR APPROACH (Right)    PROGRESS NOTE Subjective: Patient is alert, oriented, no Nausea, no Vomiting, yes passing gas, . Taking PO well. Denies SOB, Chest or Calf Pain. Using Incentive Spirometer, PAS in place. Ambulate WBAT with pt up to go to bathroom overnight Patient reports pain as  mild .    Objective: Vital signs in last 24 hours: Vitals:   12/21/21 2028 12/21/21 2250 12/22/21 0103 12/22/21 0510  BP: 122/73  127/89 114/67  Pulse: (!) 101 91 73 64  Resp: 18  16 17   Temp: 97.8 F (36.6 C)  98 F (36.7 C) 98.4 F (36.9 C)  TempSrc: Oral  Oral Oral  SpO2: 95%  99% 92%  Weight:      Height:          Intake/Output from previous day: I/O last 3 completed shifts: In: 5435.7 [P.O.:1140; I.V.:3795.7; IV Piggyback:500] Out: 3080 [Urine:2080; Blood:1000]   Intake/Output this shift: No intake/output data recorded.   LABORATORY DATA: Recent Labs    12/21/21 1033 12/22/21 0414  WBC 12.7* 13.2*  HGB 9.9* 10.2*  HCT 30.3* 31.8*  PLT 237 247    Examination: Neurologically intact Neurovascular intact Sensation intact distally Intact pulses distally Dorsiflexion/Plantar flexion intact Incision: dressing C/D/I and no drainage No cellulitis present Compartment soft} XR AP&Lat of hip shows well placed\fixed THA  Assessment:   1 Day Post-Op Procedure(s) (LRB): TOTAL HIP ARTHROPLASTY ANTERIOR APPROACH (Right) ADDITIONAL DIAGNOSIS:  Expected Acute Blood Loss Anemia, Cardiac Arrythmia afib and empysema  Patient's anticipated LOS is less than 2 midnights, meeting these requirements: - Younger than 27 - Lives within 1 hour of care - Has a competent adult at home to recover with post-op recover - NO history of  - Chronic pain requiring opiods  - Diabetes  - Coronary Artery Disease  - Heart failure  - Heart attack  - Stroke  -  DVT/VTE  - Cardiac arrhythmia  - Respiratory Failure/COPD  - Renal failure  - Anemia  - Advanced Liver disease     Plan: PT/OT WBAT, THA  DVT Prophylaxis: SCDx72 hrs, eliquis  DISCHARGE PLAN: Home  DISCHARGE NEEDS: HHPT, Walker, and 3-in-1 comode seat

## 2021-12-24 ENCOUNTER — Other Ambulatory Visit: Payer: Self-pay

## 2021-12-24 ENCOUNTER — Encounter (HOSPITAL_COMMUNITY): Payer: Self-pay | Admitting: Orthopedic Surgery

## 2021-12-24 ENCOUNTER — Ambulatory Visit: Payer: Medicare HMO | Attending: Orthopedic Surgery | Admitting: Physical Therapy

## 2021-12-24 DIAGNOSIS — R262 Difficulty in walking, not elsewhere classified: Secondary | ICD-10-CM | POA: Diagnosis not present

## 2021-12-24 DIAGNOSIS — Z96649 Presence of unspecified artificial hip joint: Secondary | ICD-10-CM | POA: Diagnosis not present

## 2021-12-24 DIAGNOSIS — M25651 Stiffness of right hip, not elsewhere classified: Secondary | ICD-10-CM

## 2021-12-24 DIAGNOSIS — M6281 Muscle weakness (generalized): Secondary | ICD-10-CM

## 2021-12-24 DIAGNOSIS — M25551 Pain in right hip: Secondary | ICD-10-CM

## 2021-12-24 DIAGNOSIS — R2689 Other abnormalities of gait and mobility: Secondary | ICD-10-CM

## 2021-12-24 NOTE — Therapy (Signed)
[place], [place], [address] Phone: [phone]   Fax:  [phone]  Physical Therapy Evaluation  Patient Details  Name: Joshua [name] MRN: [id number] Date of Birth: 1946/08/23 Referring Provider (PT): Dorna Leitz   Encounter Date: 12/24/2021   PT End of Session - 12/24/21 1253     Visit Number 1    Number of Visits 16    Date for PT Re-Evaluation 02/18/22    Authorization Type [employer]    Progress Note Due on Visit 10    PT Start Time 1145    PT Stop Time 1230    PT Time Calculation (min) 45 min    Equipment Utilized During Treatment Gait belt    Activity Tolerance Patient tolerated treatment well    Behavior During Therapy [facility] for tasks assessed/performed             Past Medical History:  Diagnosis Date   Abnormal findings on diagnostic imaging of lung 09/23/2019   09/22/2019-lung cancer screening-centrilobular and paraseptal emphysema, calcified granulomata noted bilaterally, no suspicious nodules, lung RADS 1, repeat in 12 months    Anxiety    Atrial fibrillation ([place]) 09/23/2019   Centrilobular emphysema ([place]) 09/23/2019   09/22/2019-lung cancer screening-centrilobular and paraseptal emphysema, calcified granulomata noted bilaterally, no suspicious nodules, lung RADS 1, repeat in 12 months  09/23/2019-FVC 3.74 (81% predicted), postbronchodilator ratio 80, postbronchodilator FEV1 2.88 (86% predicted), no bronchodilator response, DLCO 19.44 (73% predicted)   DDD (degenerative disc disease), cervical 05/13/2008   Cervical Disc Degeneration   Depression    History of adenomatous polyp of colon 03/03/2018   05/14/17: Colonoscopy: nonadvanced adenoma, f/u 5 yrs, use SuPrep, Murphy/GAP  Last Assessment & Plan:  Relevant Hx: Course: Daily Update: Today's Plan:   Hyperlipidemia    Kidney stone    Nephrolithiasis 05/22/2011   Nontraumatic incomplete tear of right rotator cuff  10/14/2018   Other and unspecified hyperlipidemia 11/20/2009   Hyperlipidemia   RBBB 06/09/2012   Secondary hypercoagulable state ([place]) 09/28/2019   Shortness of breath 09/23/2019    Past Surgical History:  Procedure Laterality Date   CARDIOVERSION N/A 10/15/2019   Procedure: CARDIOVERSION;  Surgeon: Jerline Pain, MD;  Location: [facility] ENDOSCOPY;  Service: Cardiovascular;  Laterality: N/A;   CARDIOVERSION N/A 11/29/2019   Procedure: CARDIOVERSION;  Surgeon: Dorothy Spark, MD;  Location[facility] ENDOSCOPY;  Service: Cardiovascular;  Laterality: N/A;   Eleva Right     There were no vitals filed for this visit.    Subjective Assessment - 12/24/21 1150     Subjective Pt had THA on 12/21/21. Pt reports his hip has been getting better and has been doing exercises at home. Pt was able to walk ~1-2 miles prior to his hip issues.    Pertinent History RTC surgery 4 years ago    How long can you sit comfortably? n/a    How long can you stand comfortably? <5 minutes    How long can you walk comfortably? Able to get around house    Patient Stated Goals Cutting grass/yard work, cooking, car maintenance    Currently in Pain? Yes    Pain Score 2    at worst 6 or 7/10 when using R LE   Pain Location Hip    Pain Orientation Right    Pain Descriptors / Indicators Aching;Discomfort    Pain Type Surgical pain    Pain  Onset In the past 7 days    Pain Frequency Intermittent    Aggravating Factors  Prolonged weight bearing    Pain Relieving Factors Rest    Effect of Pain on Daily Activities Walking, standing                [facility] PT Assessment - 12/24/21 0001       Assessment   Medical Diagnosis THA    Referring Provider (PT) Berenice Primas, John    Onset Date/Surgical Date 12/21/21    Hand Dominance Right    Prior Therapy 4 years ago for RTC      Precautions   Precautions Fall      Restrictions   Weight Bearing Restrictions No      Balance Screen    Has the patient fallen in the past 6 months Yes    How many times? 3   prior to surgery   Has the patient had a decrease in activity level because of a fear of falling?  Yes    Is the patient reluctant to leave their home because of a fear of falling?  No      Home Ecologist residence    Living Arrangements Spouse/significant other    Available Help at Discharge Family    Type of [place] to enter    Entrance Stairs-Number of Steps 2    [place] to live on main level with bedroom/bathroom    [place] - 2 wheels      Prior Function   Vocation Retired      Observation/Other Assessments   Focus on Therapeutic Outcomes (FOTO)  34 (risk adjusted 31); predicted 70 at visit 15      Sensation   Light Touch Impaired Detail   along incision     ROM / Strength   AROM / PROM / Strength Strength      Strength   Strength Assessment Site Hip;Knee;Ankle    Right/Left Hip Right    Right Hip Flexion 3-/5    Right Hip Extension 3+/5    Right Hip External Rotation  3+/5    Right Hip Internal Rotation 3+/5    Right Hip ABduction 3-/5    Right Hip ADduction 3/5    Right/Left Knee Right    Right Knee Flexion 4/5    Right Knee Extension 4-/5      Transfers   Five time sit to stand comments  16 sec      Ambulation/Gait   Ambulation Distance (Feet) 90 Feet    Assistive device Rolling walker    Gait Pattern Step-through pattern;Decreased step length - left;Decreased dorsiflexion - right;Decreased weight shift to right;Right foot flat;Antalgic    Ambulation Surface Level;Indoor      Standardized Balance Assessment   Standardized Balance Assessment Timed Up and Go Test      Timed Up and Go Test   Normal TUG (seconds) 15                        Objective measurements completed on examination: See above findings.       [place] Adult PT Treatment/Exercise - 12/24/21 0001        Knee/Hip Exercises: Standing   Heel Raises 1 set;10 reps    Knee Flexion Right;1 set;10 reps    Hip Flexion Stengthening;1 set;10 reps;Knee bent;Right  Hip Abduction Stengthening;1 set;10 reps;Right      Knee/Hip Exercises: Supine   Bridges 1 set;5 reps                     PT Education - 12/24/21 1251     Education Details Discussed HEP updates and PT POC    Person(s) Educated Patient;Spouse    Methods Explanation;Demonstration;Tactile cues;Verbal cues;Handout    Comprehension Verbalized understanding;Returned demonstration;Verbal cues required;Tactile cues required              PT Short Term Goals - 12/24/21 1302       PT SHORT TERM GOAL #1   Title independent with initial HEP    Time 4    Period Weeks    Status New    Target Date 01/21/22      PT SHORT TERM GOAL #2   Title Pt will have improved 5x STS to </=13 sec to demo improved functional LE strength    Baseline 16 sec    Time 4    Period Weeks    Status New    Target Date 01/21/22      PT SHORT TERM GOAL #3   Title Pt will improve TUG score to </=13 sec with LRAD to demo decreased fall risk    Baseline 15 sec    Time 4    Period Weeks    Status New    Target Date 01/21/22      PT SHORT TERM GOAL #4   Title PT will assess Berg Balance Score to obtain baseline score    Time 2    Period Weeks    Status New    Target Date 01/07/22               PT Long Term Goals - 12/24/21 1303       PT LONG TERM GOAL #1   Title independent with advanced HEP    Time 8    Period Weeks    Status New    Target Date 02/18/22      PT LONG TERM GOAL #2   Title Pt will have improved FOTO score to 70    Time 8    Period Weeks    Status New    Target Date 02/18/22      PT LONG TERM GOAL #3   Title Pt will be able to amb at least 1000' in the community with LRAD    Time 8    Period Weeks    Status New    Target Date 02/18/22      PT LONG TERM GOAL #4   Title Pt will demo at least  8 point increase in Berg Balance Score for MDIC    Baseline TBA    Time 8    Period Weeks    Status New    Target Date 02/18/22      PT LONG TERM GOAL #5   Title Pt will demo at least 4/5 strength in R LE    Time 8    Period Weeks    Status New    Target Date 02/18/22                    Plan - 12/24/21 1253     Clinical Impression Statement Mr. Norfleet is a 76 y/o M presenting to OPPT s/p R THA (anterior approach) on 12/21/21. No hip precautions noted. Pt is WBAT. On assessment, pt demos increased  R hip edema limiting ROM and hip/knee strength. Pt's pain limiting his standing tolerance and weight bearing. Pt's 5x STS and TUG score place him at an increased fall risk. Pt with history of falls prior to surgery -- has been cleared by neuro. Pt would benefit from PT to address these deficits to reach his goal to safely return to yard work and house work.    Personal Factors and Comorbidities Age;Fitness;Time since onset of injury/illness/exacerbation    Examination-Activity Limitations Locomotion Level;Transfers;Squat;Stairs;Stand;Lift;Toileting;Carry;Bed Mobility;Bathing    Examination-Participation Restrictions Meal Prep;Cleaning;Community Activity;Yard Work;Shop;Driving    Stability/Clinical Decision Making Stable/Uncomplicated    Clinical Decision Making Low    Rehab Potential Good    PT Frequency 2x / week    PT Duration 8 weeks    PT Treatment/Interventions ADLs/Self Care Home Management;Aquatic Therapy;Cryotherapy;Electrical Stimulation;Iontophoresis 4mg /ml Dexamethasone;Moist Heat;DME Instruction;Ultrasound;Gait training;Stair training;Functional mobility training;Therapeutic activities;Therapeutic exercise;Balance training;Neuromuscular re-education;Manual techniques;Patient/family education;Dry needling;Passive range of motion;Taping;Vasopneumatic Device    PT Next Visit Plan Pt to bring exercises from hospital to review in clinic. Work on quad and hip Art therapist.  Assess Berg Balance Score if enough time.    PT Home Exercise Plan Access Code: [email or web address]    Consulted and Agree with Plan of Care Patient;Family member/caregiver    Family Member Consulted Wife, Izora Gala             Patient will benefit from skilled therapeutic intervention in order to improve the following deficits and impairments:  Abnormal gait, Decreased range of motion, Difficulty walking, Decreased endurance, Pain, Decreased balance, Decreased mobility, Decreased strength, Increased edema, Hypomobility, Impaired flexibility  Visit Diagnosis: Difficulty in walking, not elsewhere classified  Muscle weakness (generalized)  Other abnormalities of gait and mobility  Pain in right hip  Stiffness of right hip, not elsewhere classified     Problem List Patient Active Problem List   Diagnosis Date Noted   PAF (paroxysmal atrial fibrillation) ([place]) 12/13/2021   Primary osteoarthritis of right hip 10/26/2021   Abnormal liver function tests 09/10/2021   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   Urinary hesitancy 03/08/2021   Thoracic aortic aneurysm without rupture 03/08/2021   Kidney stone    Senile purpura ([place]) 09/07/2020   Aortic atherosclerosis ([place]) 09/07/2020   Sinus bradycardia 03/31/2020   Essential hypertension 03/31/2020   Major depressive disorder 03/07/2020   Essential tremor 03/07/2020   Tremor 01/21/2020   Obesity (BMI 30-39.9) 12/10/2019   Chest pain of uncertain etiology 54/07/8118   Secondary hypercoagulable state ([place]) 09/28/2019   Centrilobular emphysema ([place]) 09/23/2019   Former smoker 09/23/2019   Abnormal findings on diagnostic imaging of lung 09/23/2019   Atrial fibrillation ([place]) 09/23/2019   At risk for sleep apnea 09/23/2019   Shortness of breath 09/23/2019   Nontraumatic incomplete tear of right rotator cuff 10/14/2018   History of adenomatous polyp of colon 03/03/2018   RBBB 06/09/2012   Nephrolithiasis 05/22/2011   Mixed hyperlipidemia 11/20/2009   DDD  (degenerative disc disease), cervical 05/13/2008    [facility] April Gordy Levan, PT, DPT 12/24/2021, 1:09 PM  [facility] [place] [address] [place], [place], [address] Phone: [phone]   Fax:  [phone]  Name: Joshua [name] [facility] [name] MRN: [id number] Date of Birth: [birth year]

## 2021-12-26 ENCOUNTER — Other Ambulatory Visit: Payer: Self-pay

## 2021-12-26 ENCOUNTER — Ambulatory Visit: Payer: Medicare HMO | Admitting: Physical Therapy

## 2021-12-26 DIAGNOSIS — M6281 Muscle weakness (generalized): Secondary | ICD-10-CM

## 2021-12-26 DIAGNOSIS — M25651 Stiffness of right hip, not elsewhere classified: Secondary | ICD-10-CM

## 2021-12-26 DIAGNOSIS — M25551 Pain in right hip: Secondary | ICD-10-CM

## 2021-12-26 DIAGNOSIS — Z96649 Presence of unspecified artificial hip joint: Secondary | ICD-10-CM | POA: Diagnosis not present

## 2021-12-26 DIAGNOSIS — R2689 Other abnormalities of gait and mobility: Secondary | ICD-10-CM

## 2021-12-26 DIAGNOSIS — R262 Difficulty in walking, not elsewhere classified: Secondary | ICD-10-CM | POA: Diagnosis not present

## 2021-12-26 NOTE — Therapy (Signed)
[place], [place], [address] Phone: [phone]   Fax:  [phone]  Physical Therapy Treatment  Patient Details  Name: Joshua [name] MRN: [id number] Date of Birth: 1946-08-08 Referring Provider (PT): Dorna Leitz   Encounter Date: 12/26/2021   PT End of Session - 12/26/21 1132     Visit Number 2    Number of Visits 16    Date for PT Re-Evaluation 02/18/22    Authorization Type [employer]    Progress Note Due on Visit 10    PT Start Time 1105    PT Stop Time 1145    PT Time Calculation (min) 40 min    Equipment Utilized During Treatment Gait belt    Activity Tolerance Patient tolerated treatment well    Behavior During Therapy [facility] for tasks assessed/performed             Past Medical History:  Diagnosis Date   Abnormal findings on diagnostic imaging of lung 09/23/2019   09/22/2019-lung cancer screening-centrilobular and paraseptal emphysema, calcified granulomata noted bilaterally, no suspicious nodules, lung RADS 1, repeat in 12 months    Anxiety    Atrial fibrillation ([place]) 09/23/2019   Centrilobular emphysema ([place]) 09/23/2019   09/22/2019-lung cancer screening-centrilobular and paraseptal emphysema, calcified granulomata noted bilaterally, no suspicious nodules, lung RADS 1, repeat in 12 months  09/23/2019-FVC 3.74 (81% predicted), postbronchodilator ratio 80, postbronchodilator FEV1 2.88 (86% predicted), no bronchodilator response, DLCO 19.44 (73% predicted)   DDD (degenerative disc disease), cervical 05/13/2008   Cervical Disc Degeneration   Depression    History of adenomatous polyp of colon 03/03/2018   05/14/17: Colonoscopy: nonadvanced adenoma, f/u 5 yrs, use SuPrep, Murphy/GAP  Last Assessment & Plan:  Relevant Hx: Course: Daily Update: Today's Plan:   Hyperlipidemia    Kidney stone    Nephrolithiasis 05/22/2011   Nontraumatic incomplete tear of right rotator cuff  10/14/2018   Other and unspecified hyperlipidemia 11/20/2009   Hyperlipidemia   RBBB 06/09/2012   Secondary hypercoagulable state ([place]) 09/28/2019   Shortness of breath 09/23/2019    Past Surgical History:  Procedure Laterality Date   CARDIOVERSION N/A 10/15/2019   Procedure: CARDIOVERSION;  Surgeon: Jerline Pain, MD;  Location: [facility] ENDOSCOPY;  Service: Cardiovascular;  Laterality: N/A;   CARDIOVERSION N/A 11/29/2019   Procedure: CARDIOVERSION;  Surgeon: Dorothy Spark, MD;  Location: [facility] ENDOSCOPY;  Service: Cardiovascular;  Laterality: N/A;   [place] Right 12/21/2021   Procedure: TOTAL HIP ARTHROPLASTY ANTERIOR APPROACH;  Surgeon: Dorna Leitz, MD;  Location: [facility] ORS;  Service: Orthopedics;  Laterality: Right;   VARICOSE VEIN SURGERY Right     There were no vitals filed for this visit.   Subjective Assessment - 12/26/21 1159     Subjective Pt reports increased pain and soreness today. He has been doing both the exercises from the hospital and exercises provided to him on initial eval.    Pertinent History RTC surgery 4 years ago    How long can you sit comfortably? n/a    How long can you stand comfortably? <5 minutes    How long can you walk comfortably? Able to get around house    Patient Stated Goals Cutting grass/yard work, cooking, car maintenance    Currently in Pain? Yes    Pain Score 4     Pain Location Hip    Pain Orientation Right    Pain Descriptors / Indicators  Aching;Discomfort    Pain Onset In the past 7 days                [facility] PT Assessment - 12/26/21 0001       Standardized Balance Assessment   Standardized Balance Assessment Berg Balance Test      Berg Balance Test   Sit to Stand Able to stand without using hands and stabilize independently    Standing Unsupported Able to stand safely 2 minutes    Sitting with Back Unsupported but Feet Supported on Floor or Stool Able to sit safely and securely 2 minutes     Stand to Sit Sits safely with minimal use of hands    Transfers Able to transfer safely, minor use of hands    Standing Unsupported with Eyes Closed Able to stand 10 seconds with supervision    Standing Unsupported with Feet Together Able to place feet together independently and stand 1 minute safely    From Standing, Reach Forward with Outstretched Arm Can reach confidently >25 cm (10")    From Standing Position, Pick up Object from Floor Able to pick up shoe, needs supervision    From Standing Position, Turn to Look Behind Over each Shoulder Looks behind from both sides and weight shifts well    Turn 360 Degrees Able to turn 360 degrees safely but slowly    Standing Unsupported, Alternately Place Feet on Step/Stool Able to complete >2 steps/needs minimal assist    Standing Unsupported, One Foot in Front Able to plae foot ahead of the other independently and hold 30 seconds    Standing on One Leg Tries to lift leg/unable to hold 3 seconds but remains standing independently    Total Score 45    Berg comment: 45/56                           [facility] Adult PT Treatment/Exercise - 12/26/21 0001       Knee/Hip Exercises: Stretches   Passive Hamstring Stretch Right;30 seconds    Passive Hamstring Stretch Limitations seated    Public librarian Limitations prone with strap    Hip Flexor Stretch Right;2 reps;30 seconds    Hip Flexor Stretch Limitations standing    Other Knee/Hip Stretches Seated adductor stretch x30 sec      Knee/Hip Exercises: Standing   Terminal Knee Extension Strengthening;Right;Left;10 reps      Knee/Hip Exercises: Seated   Long Arc Quad Strengthening;Right;10 reps    Long Arc Quad Limitations red tband    Hamstring Curl Strengthening;Right;2 sets;10 reps    Hamstring Limitations red tband      Modalities   Modalities Cryotherapy      Cryotherapy   Number Minutes Cryotherapy 5 Minutes   while discussing HEP    Cryotherapy Location Hip    Type of Cryotherapy Ice pack                 Balance Exercises - 12/26/21 0001       Balance Exercises: Standing   Retro Gait 2 reps   x30'   Marching Forwards   x25' (limited due to increased pain)               PT Education - 12/26/21 1156     Education Details Discussed not overdoing exercises and providing himself recovery/rest days. Updated exercises with a few gentle stretches for pt. Discussed compiling exercises so  they would be less redundant. Discussed using ice especially after he does a lot of activity (i.e. increased walking or after all of his exercises).    Person(s) Educated Patient    Methods Explanation;Demonstration;Tactile cues;Verbal cues;Handout    Comprehension Returned demonstration;Verbalized understanding;Verbal cues required;Tactile cues required;Need further instruction              PT Short Term Goals - 12/26/21 1206       PT SHORT TERM GOAL #1   Title independent with initial HEP    Time 4    Period Weeks    Status New    Target Date 01/21/22      PT SHORT TERM GOAL #2   Title Pt will have improved 5x STS to </=13 sec to demo improved functional LE strength    Baseline 16 sec    Time 4    Period Weeks    Status New    Target Date 01/21/22      PT SHORT TERM GOAL #3   Title Pt will improve TUG score to </=13 sec with LRAD to demo decreased fall risk    Baseline 15 sec    Time 4    Period Weeks    Status New    Target Date 01/21/22      PT SHORT TERM GOAL #4   Title PT will assess Berg Balance Score to obtain baseline score    Baseline 45/56 on 12/26/21    Time 2    Period Weeks    Status Achieved    Target Date 01/07/22               PT Long Term Goals - 12/24/21 1303       PT LONG TERM GOAL #1   Title independent with advanced HEP    Time 8    Period Weeks    Status New    Target Date 02/18/22      PT LONG TERM GOAL #2   Title Pt will have improved FOTO score to 70     Time 8    Period Weeks    Status New    Target Date 02/18/22      PT LONG TERM GOAL #3   Title Pt will be able to amb at least 1000' in the community with LRAD    Time 8    Period Weeks    Status New    Target Date 02/18/22      PT LONG TERM GOAL #4   Title Pt will demo at least 8 point increase in Berg Balance Score for MDIC    Baseline TBA    Time 8    Period Weeks    Status New    Target Date 02/18/22      PT LONG TERM GOAL #5   Title Pt will demo at least 4/5 strength in R LE    Time 8    Period Weeks    Status New    Target Date 02/18/22                   Plan - 12/26/21 1200     Clinical Impression Statement Pt with increased soreness today -- suspect this is because he is performing all exercises from initial eval and ones given to him by the hospital. Focused on consolidating his strengthening exercises and updating them as needed (i.e. long arc quad with red tband) and providing him gentle stretches for "rest"/"recovery" days.  Encouraged pt to use ice particularly after increased activity levels to further help reduce his soreness.    Personal Factors and Comorbidities Age;Fitness;Time since onset of injury/illness/exacerbation    Examination-Activity Limitations Locomotion Level;Transfers;Squat;Stairs;Stand;Lift;Toileting;Carry;Bed Mobility;Bathing    Examination-Participation Restrictions Meal Prep;Cleaning;Community Activity;Yard Work;Shop;Driving    Stability/Clinical Decision Making Stable/Uncomplicated    Rehab Potential Good    PT Frequency 2x / week    PT Duration 8 weeks    PT Treatment/Interventions ADLs/Self Care Home Management;Aquatic Therapy;Cryotherapy;Electrical Stimulation;Iontophoresis 4mg /ml Dexamethasone;Moist Heat;DME Instruction;Ultrasound;Gait training;Stair training;Functional mobility training;Therapeutic activities;Therapeutic exercise;Balance training;Neuromuscular re-education;Manual techniques;Patient/family education;Dry  needling;Passive range of motion;Taping;Vasopneumatic Device    PT Next Visit Plan Work on hip and quad strengthening. Continue to progress pt's resistance as tolerated. Work on [employer] as able.    PT Home Exercise Plan Access Code: [email or web address]    Consulted and Agree with Plan of Care Patient;Family member/caregiver    Family Member Consulted Wife, Izora Gala             Patient will benefit from skilled therapeutic intervention in order to improve the following deficits and impairments:  Abnormal gait, Decreased range of motion, Difficulty walking, Decreased endurance, Pain, Decreased balance, Decreased mobility, Decreased strength, Increased edema, Hypomobility, Impaired flexibility  Visit Diagnosis: Difficulty in walking, not elsewhere classified  Muscle weakness (generalized)  Other abnormalities of gait and mobility  Stiffness of right hip, not elsewhere classified  Pain in right hip     Problem List Patient Active Problem List   Diagnosis Date Noted   PAF (paroxysmal atrial fibrillation) ([place]) 12/13/2021   Primary osteoarthritis of right hip 10/26/2021   Abnormal liver function tests 09/10/2021   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   Urinary hesitancy 03/08/2021   Thoracic aortic aneurysm without rupture 03/08/2021   Kidney stone    Senile purpura ([place]) 09/07/2020   Aortic atherosclerosis ([place]) 09/07/2020   Sinus bradycardia 03/31/2020   Essential hypertension 03/31/2020   Major depressive disorder 03/07/2020   Essential tremor 03/07/2020   Tremor 01/21/2020   Obesity (BMI 30-39.9) 12/10/2019   Chest pain of uncertain etiology 81/27/5170   Secondary hypercoagulable state ([place]) 09/28/2019   Centrilobular emphysema ([place]) 09/23/2019   Former smoker 09/23/2019   Abnormal findings on diagnostic imaging of lung 09/23/2019   Atrial fibrillation ([place]) 09/23/2019   At risk for sleep apnea 09/23/2019   Shortness of breath 09/23/2019   Nontraumatic incomplete tear of right rotator cuff  10/14/2018   History of adenomatous polyp of colon 03/03/2018   RBBB 06/09/2012   Nephrolithiasis 05/22/2011   Mixed hyperlipidemia 11/20/2009   DDD (degenerative disc disease), cervical 05/13/2008    [facility] April Gordy Levan, PT, DPT 12/26/2021, 12:07 PM  [place] [address] [place], [place], [address] Phone: [phone]   Fax:  [phone]  Name: Joshua [name] MRN: [id number] Date of Birth: [birth year]

## 2021-12-31 NOTE — Anesthesia Postprocedure Evaluation (Signed)
Anesthesia Post Note  Patient: Joshua [name]  Procedure(s) Performed: TOTAL HIP ARTHROPLASTY ANTERIOR APPROACH (Right: Hip)     Patient location during evaluation: PACU Anesthesia Type: General Level of consciousness: awake and alert Pain management: pain level controlled Vital Signs Assessment: post-procedure vital signs reviewed and stable Respiratory status: spontaneous breathing, nonlabored ventilation, respiratory function stable and patient connected to nasal cannula oxygen Cardiovascular status: blood pressure returned to baseline and stable Postop Assessment: no apparent nausea or vomiting Anesthetic complications: no   No notable events documented.  Last Vitals:  Vitals:   12/22/21 0510 12/22/21 0942  BP: 114/67 109/65  Pulse: 64 78  Resp: 17 18  Temp: 36.9 C 36.9 C  SpO2: 92% 90%    Last Pain:  Vitals:   12/22/21 1351  TempSrc:   PainSc: 5                  Lessly Stigler

## 2022-01-01 ENCOUNTER — Ambulatory Visit: Payer: Medicare HMO | Admitting: Physical Therapy

## 2022-01-01 ENCOUNTER — Other Ambulatory Visit: Payer: Self-pay

## 2022-01-01 DIAGNOSIS — M25551 Pain in right hip: Secondary | ICD-10-CM

## 2022-01-01 DIAGNOSIS — Z96649 Presence of unspecified artificial hip joint: Secondary | ICD-10-CM | POA: Diagnosis not present

## 2022-01-01 DIAGNOSIS — M6281 Muscle weakness (generalized): Secondary | ICD-10-CM

## 2022-01-01 DIAGNOSIS — R2689 Other abnormalities of gait and mobility: Secondary | ICD-10-CM

## 2022-01-01 DIAGNOSIS — R262 Difficulty in walking, not elsewhere classified: Secondary | ICD-10-CM | POA: Diagnosis not present

## 2022-01-01 DIAGNOSIS — M25651 Stiffness of right hip, not elsewhere classified: Secondary | ICD-10-CM

## 2022-01-01 NOTE — Therapy (Signed)
[place], [place], [address] Phone: [phone]   Fax:  [phone]  Physical Therapy Treatment  Patient Details  Name: Joshua [name] MRN: [id number] Date of Birth: 1946/08/06 Referring Provider (PT): Dorna Leitz   Encounter Date: 01/01/2022   PT End of Session - 01/01/22 0934     Visit Number 3    Number of Visits 16    Date for PT Re-Evaluation 02/18/22    Authorization Type [employer]    Progress Note Due on Visit 10    PT Start Time 0934    PT Stop Time 1015    PT Time Calculation (min) 41 min    Equipment Utilized During Treatment Gait belt    Activity Tolerance Patient tolerated treatment well    Behavior During Therapy [facility] for tasks assessed/performed             Past Medical History:  Diagnosis Date   Abnormal findings on diagnostic imaging of lung 09/23/2019   09/22/2019-lung cancer screening-centrilobular and paraseptal emphysema, calcified granulomata noted bilaterally, no suspicious nodules, lung RADS 1, repeat in 12 months    Anxiety    Atrial fibrillation ([place]) 09/23/2019   Centrilobular emphysema ([place]) 09/23/2019   09/22/2019-lung cancer screening-centrilobular and paraseptal emphysema, calcified granulomata noted bilaterally, no suspicious nodules, lung RADS 1, repeat in 12 months  09/23/2019-FVC 3.74 (81% predicted), postbronchodilator ratio 80, postbronchodilator FEV1 2.88 (86% predicted), no bronchodilator response, DLCO 19.44 (73% predicted)   DDD (degenerative disc disease), cervical 05/13/2008   Cervical Disc Degeneration   Depression    History of adenomatous polyp of colon 03/03/2018   05/14/17: Colonoscopy: nonadvanced adenoma, f/u 5 yrs, use SuPrep, Murphy/GAP  Last Assessment & Plan:  Relevant Hx: Course: Daily Update: Today's Plan:   Hyperlipidemia    Kidney stone    Nephrolithiasis 05/22/2011   Nontraumatic incomplete tear of right rotator cuff  10/14/2018   Other and unspecified hyperlipidemia 11/20/2009   Hyperlipidemia   RBBB 06/09/2012   Secondary hypercoagulable state ([place]) 09/28/2019   Shortness of breath 09/23/2019    Past Surgical History:  Procedure Laterality Date   CARDIOVERSION N/A 10/15/2019   Procedure: CARDIOVERSION;  Surgeon: Jerline Pain, MD;  Location: [facility] ENDOSCOPY;  Service: Cardiovascular;  Laterality: N/A;   CARDIOVERSION N/A 11/29/2019   Procedure: CARDIOVERSION;  Surgeon: Dorothy Spark, MD;  Location: [facility] ENDOSCOPY;  Service: Cardiovascular;  Laterality: N/A;   [place] Right 12/21/2021   Procedure: TOTAL HIP ARTHROPLASTY ANTERIOR APPROACH;  Surgeon: Dorna Leitz, MD;  Location: [facility] ORS;  Service: Orthopedics;  Laterality: Right;   VARICOSE VEIN SURGERY Right     There were no vitals filed for this visit.   Subjective Assessment - 01/01/22 0939     Subjective Pt notes some popping when performing his adductor stretch but no pain. Pt states it's hardest in the morning. Pt has difficulty sleeping at night.    Pertinent History RTC surgery 4 years ago    How long can you sit comfortably? n/a    How long can you stand comfortably? <5 minutes    How long can you walk comfortably? Able to get around house    Patient Stated Goals Cutting grass/yard work, cooking, car maintenance    Currently in Pain? Yes    Pain Score 6     Pain Location Hip    Pain Orientation Right    Pain Descriptors / Indicators  Aching;Discomfort    Pain Type Surgical pain    Pain Onset In the past 7 days                [facility] PT Assessment - 01/01/22 0001       Assessment   Medical Diagnosis THA    Referring Provider (PT) Berenice Primas, John    Onset Date/Surgical Date 12/21/21    Hand Dominance Right                           [facility] Adult PT Treatment/Exercise - 01/01/22 0001       Ambulation/Gait   Ambulation/Gait Yes    Ambulation/Gait Assistance 5:  Supervision    Ambulation Distance (Feet) 160 Feet    Assistive device Rolling walker    Gait Pattern Step-through pattern;Trunk flexed    Ambulation Surface Level;Indoor    Gait Comments cues to maintain trunk and hip extension throughout gait and keep close to RW      Knee/Hip Exercises: Stretches   Passive Hamstring Stretch Right;30 seconds    Passive Hamstring Stretch Limitations supine with strap    Other Knee/Hip Stretches figure 4 stretch x30 sec      Knee/Hip Exercises: Standing   Hip Abduction Stengthening;1 set;10 reps;Right;Left    Abduction Limitations red tband    Hip Extension Stengthening;Right;Left;10 reps    Extension Limitations red tband      Knee/Hip Exercises: Supine   Quad Sets Strengthening;2 sets;10 reps    Quad Sets Limitations 3 sec hold    Straight Leg Raises Strengthening;Right;10 reps    Other Supine Knee/Hip Exercises Marching 2x10      Knee/Hip Exercises: Sidelying   Clams 2x10 R                       PT Short Term Goals - 12/26/21 1206       PT SHORT TERM GOAL #1   Title independent with initial HEP    Time 4    Period Weeks    Status New    Target Date 01/21/22      PT SHORT TERM GOAL #2   Title Pt will have improved 5x STS to </=13 sec to demo improved functional LE strength    Baseline 16 sec    Time 4    Period Weeks    Status New    Target Date 01/21/22      PT SHORT TERM GOAL #3   Title Pt will improve TUG score to </=13 sec with LRAD to demo decreased fall risk    Baseline 15 sec    Time 4    Period Weeks    Status New    Target Date 01/21/22      PT SHORT TERM GOAL #4   Title PT will assess Berg Balance Score to obtain baseline score    Baseline 45/56 on 12/26/21    Time 2    Period Weeks    Status Achieved    Target Date 01/07/22               PT Long Term Goals - 12/24/21 1303       PT LONG TERM GOAL #1   Title independent with advanced HEP    Time 8    Period Weeks    Status New     Target Date 02/18/22      PT LONG TERM GOAL #2  Title Pt will have improved FOTO score to 70    Time 8    Period Weeks    Status New    Target Date 02/18/22      PT LONG TERM GOAL #3   Title Pt will be able to amb at least 1000' in the community with LRAD    Time 8    Period Weeks    Status New    Target Date 02/18/22      PT LONG TERM GOAL #4   Title Pt will demo at least 8 point increase in Berg Balance Score for MDIC    Baseline TBA    Time 8    Period Weeks    Status New    Target Date 02/18/22      PT LONG TERM GOAL #5   Title Pt will demo at least 4/5 strength in R LE    Time 8    Period Weeks    Status New    Target Date 02/18/22                   Plan - 01/01/22 0958     Clinical Impression Statement Treatment session focused on continued hip and quad strengthening. Only able to perform SLR up to 2" above mat table this session. Tolerated addition of red tband with his standing exercises. Pt reports feeling his L LE dragging while standing on his R LE while performing his standing exercises -- will ask MD about this due to possible leg length discrepancy. Will try and measure this next session. Took out adductor stretch due to pt report of popping when performing this.    Personal Factors and Comorbidities Age;Fitness;Time since onset of injury/illness/exacerbation    Examination-Activity Limitations Locomotion Level;Transfers;Squat;Stairs;Stand;Lift;Toileting;Carry;Bed Mobility;Bathing    Examination-Participation Restrictions Meal Prep;Cleaning;Community Activity;Yard Work;Shop;Driving    Stability/Clinical Decision Making Stable/Uncomplicated    Rehab Potential Good    PT Frequency 2x / week    PT Duration 8 weeks    PT Treatment/Interventions ADLs/Self Care Home Management;Aquatic Therapy;Cryotherapy;Electrical Stimulation;Iontophoresis 4mg /ml Dexamethasone;Moist Heat;DME Instruction;Ultrasound;Gait training;Stair training;Functional mobility  training;Therapeutic activities;Therapeutic exercise;Balance training;Neuromuscular re-education;Manual techniques;Patient/family education;Dry needling;Passive range of motion;Taping;Vasopneumatic Device    PT Next Visit Plan Work on hip and quad strengthening. Continue to progress pt's resistance as tolerated. Work on [employer] as able.    PT Home Exercise Plan Access Code: [email or web address]    Consulted and Agree with Plan of Care Patient;Family member/caregiver    Family Member Consulted Wife, Izora Gala             Patient will benefit from skilled therapeutic intervention in order to improve the following deficits and impairments:  Abnormal gait, Decreased range of motion, Difficulty walking, Decreased endurance, Pain, Decreased balance, Decreased mobility, Decreased strength, Increased edema, Hypomobility, Impaired flexibility  Visit Diagnosis: Difficulty in walking, not elsewhere classified  Muscle weakness (generalized)  Other abnormalities of gait and mobility  Stiffness of right hip, not elsewhere classified  Pain in right hip     Problem List Patient Active Problem List   Diagnosis Date Noted   PAF (paroxysmal atrial fibrillation) ([place]) 12/13/2021   Primary osteoarthritis of right hip 10/26/2021   Abnormal liver function tests 09/10/2021   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   Urinary hesitancy 03/08/2021   Thoracic aortic aneurysm without rupture 03/08/2021   Kidney stone    Senile purpura ([place]) 09/07/2020   Aortic atherosclerosis ([place]) 09/07/2020   Sinus bradycardia 03/31/2020   Essential hypertension 03/31/2020   Major depressive disorder  03/07/2020   Essential tremor 03/07/2020   Tremor 01/21/2020   Obesity (BMI 30-39.9) 12/10/2019   Chest pain of uncertain etiology 68/61/6837   Secondary hypercoagulable state ([place]) 09/28/2019   Centrilobular emphysema ([place]) 09/23/2019   Former smoker 09/23/2019   Abnormal findings on diagnostic imaging of lung 09/23/2019   Atrial fibrillation  ([place]) 09/23/2019   At risk for sleep apnea 09/23/2019   Shortness of breath 09/23/2019   Nontraumatic incomplete tear of right rotator cuff 10/14/2018   History of adenomatous polyp of colon 03/03/2018   RBBB 06/09/2012   Nephrolithiasis 05/22/2011   Mixed hyperlipidemia 11/20/2009   DDD (degenerative disc disease), cervical 05/13/2008    [facility] April Gordy Levan, PT, DPT 01/01/2022, 11:51 AM  [facility] [place] [address] [place], [place], [address] Phone: [phone]   Fax:  [phone]  Name: Joshua [name] MRN: [id number] Date of Birth: [birth year]

## 2022-01-03 DIAGNOSIS — M1611 Unilateral primary osteoarthritis, right hip: Secondary | ICD-10-CM | POA: Diagnosis not present

## 2022-01-03 DIAGNOSIS — Z9889 Other specified postprocedural states: Secondary | ICD-10-CM | POA: Diagnosis not present

## 2022-01-04 ENCOUNTER — Ambulatory Visit: Payer: Medicare HMO | Attending: Orthopedic Surgery | Admitting: Physical Therapy

## 2022-01-04 ENCOUNTER — Other Ambulatory Visit: Payer: Self-pay

## 2022-01-04 DIAGNOSIS — M25651 Stiffness of right hip, not elsewhere classified: Secondary | ICD-10-CM | POA: Insufficient documentation

## 2022-01-04 DIAGNOSIS — R262 Difficulty in walking, not elsewhere classified: Secondary | ICD-10-CM | POA: Insufficient documentation

## 2022-01-04 DIAGNOSIS — M6281 Muscle weakness (generalized): Secondary | ICD-10-CM | POA: Diagnosis not present

## 2022-01-04 DIAGNOSIS — R2689 Other abnormalities of gait and mobility: Secondary | ICD-10-CM | POA: Insufficient documentation

## 2022-01-04 DIAGNOSIS — M25551 Pain in right hip: Secondary | ICD-10-CM | POA: Diagnosis not present

## 2022-01-04 NOTE — Therapy (Signed)
[place], [place], [address] Phone: [phone]   Fax:  [phone]  Physical Therapy Treatment  Patient Details  Name: Joshua [name] [facility] III MRN: [id number] Date of Birth: 1946/02/23 Referring Provider (PT): Dorna Leitz   Encounter Date: 01/04/2022   PT End of Session - 01/04/22 1059     Visit Number 4    Number of Visits 16    Date for PT Re-Evaluation 02/18/22    Authorization Type [employer]    Progress Note Due on Visit 10    PT Start Time 1100    PT Stop Time 1140    PT Time Calculation (min) 40 min    Equipment Utilized During Treatment Gait belt    Activity Tolerance Patient tolerated treatment well    Behavior During Therapy [facility] for tasks assessed/performed             Past Medical History:  Diagnosis Date   Abnormal findings on diagnostic imaging of lung 09/23/2019   09/22/2019-lung cancer screening-centrilobular and paraseptal emphysema, calcified granulomata noted bilaterally, no suspicious nodules, lung RADS 1, repeat in 12 months    Anxiety    Atrial fibrillation ([place]) 09/23/2019   Centrilobular emphysema ([place]) 09/23/2019   09/22/2019-lung cancer screening-centrilobular and paraseptal emphysema, calcified granulomata noted bilaterally, no suspicious nodules, lung RADS 1, repeat in 12 months  09/23/2019-FVC 3.74 (81% predicted), postbronchodilator ratio 80, postbronchodilator FEV1 2.88 (86% predicted), no bronchodilator response, DLCO 19.44 (73% predicted)   DDD (degenerative disc disease), cervical 05/13/2008   Cervical Disc Degeneration   Depression    History of adenomatous polyp of colon 03/03/2018   05/14/17: Colonoscopy: nonadvanced adenoma, f/u 5 yrs, use SuPrep, Murphy/GAP  Last Assessment & Plan:  Relevant Hx: Course: Daily Update: Today's Plan:   Hyperlipidemia    Kidney stone    Nephrolithiasis 05/22/2011   Nontraumatic incomplete tear of right rotator cuff  10/14/2018   Other and unspecified hyperlipidemia 11/20/2009   Hyperlipidemia   RBBB 06/09/2012   Secondary hypercoagulable state ([place]) 09/28/2019   Shortness of breath 09/23/2019    Past Surgical History:  Procedure Laterality Date   CARDIOVERSION N/A 10/15/2019   Procedure: CARDIOVERSION;  Surgeon: Jerline Pain, MD;  Location: [facility] ENDOSCOPY;  Service: Cardiovascular;  Laterality: N/A;   CARDIOVERSION N/A 11/29/2019   Procedure: CARDIOVERSION;  Surgeon: Dorothy Spark, MD;  Location: [facility] ENDOSCOPY;  Service: Cardiovascular;  Laterality: N/A;   [place] Right 12/21/2021   Procedure: TOTAL HIP ARTHROPLASTY ANTERIOR APPROACH;  Surgeon: Dorna Leitz, MD;  Location: [facility] ORS;  Service: Orthopedics;  Laterality: Right;   VARICOSE VEIN SURGERY Right     There were no vitals filed for this visit.   Subjective Assessment - 01/04/22 1114     Subjective Pt states he saw his doctor yesterday. Pt got his bandage removed. No other issues. States Dr. Berenice Primas feels that pt's popping could just be a tendon or ligament moving. He also had stated to wait a few more weeks in regards to the leg length discrepancy.    Pertinent History RTC surgery 4 years ago    How long can you sit comfortably? n/a    How long can you stand comfortably? <5 minutes    How long can you walk comfortably? Able to get around house    Patient Stated Goals Cutting grass/yard work, cooking, car maintenance    Currently in Pain? Yes    Pain  Score 6     Pain Location Hip    Pain Orientation Right    Pain Descriptors / Indicators Aching;Discomfort    Pain Type Surgical pain    Pain Onset 1 to 4 weeks ago                [facility] PT Assessment - 01/04/22 0001       Assessment   Medical Diagnosis THA    Referring Provider (PT) Berenice Primas, John    Onset Date/Surgical Date 12/21/21    Hand Dominance Right                           [facility] Adult PT Treatment/Exercise -  01/04/22 0001       Ambulation/Gait   Ambulation/Gait Assistance 5: Supervision    Ambulation Distance (Feet) 240 Feet    Assistive device Straight cane    Gait Pattern Step-through pattern;Trunk flexed    Ambulation Surface Level;Indoor    Stairs Yes    Stairs Assistance 4: Min guard    Stair Management Technique Alternating pattern;With cane    Number of Stairs 9    Height of Stairs 4    Pre-Gait Activities Next to counter: wide tandem rocking forward/backward x10 working on weight shift and maintaining hip extension    Gait Comments cues for hip extension      Knee/Hip Exercises: Stretches   Passive Hamstring Stretch Right;30 seconds    Passive Hamstring Stretch Limitations supine with strap    Hip Flexor Stretch Right;2 reps;20 seconds    Hip Flexor Stretch Limitations supine      Knee/Hip Exercises: Standing   Hip Abduction Stengthening;1 set;10 reps;Right;Left    Abduction Limitations green tband    Hip Extension Stengthening;Right;Left;10 reps    Extension Limitations green tband      Knee/Hip Exercises: Seated   Long Arc Quad Strengthening;Right;20 reps    Long Arc Quad Limitations green tband    Hamstring Curl Strengthening;Right;2 sets;10 reps    Hamstring Limitations green tband      Knee/Hip Exercises: Supine   Short Arc Quad Sets Strengthening;Right;10 reps;2 sets    Short Arc Quad Sets Limitations 3 sec hold    Heel Slides AROM;10 reps    Heel Slides Limitations with strap 3 sec hold    Straight Leg Raises Strengthening;Right;10 reps      Manual Therapy   Manual therapy comments gentle STM and TPR along R TFL and proximal quad near pt's incision                       PT Short Term Goals - 12/26/21 1206       PT SHORT TERM GOAL #1   Title independent with initial HEP    Time 4    Period Weeks    Status New    Target Date 01/21/22      PT SHORT TERM GOAL #2   Title Pt will have improved 5x STS to </=13 sec to demo improved functional  LE strength    Baseline 16 sec    Time 4    Period Weeks    Status New    Target Date 01/21/22      PT SHORT TERM GOAL #3   Title Pt will improve TUG score to </=13 sec with LRAD to demo decreased fall risk    Baseline 15 sec    Time 4    Period Weeks  Status New    Target Date 01/21/22      PT SHORT TERM GOAL #4   Title PT will assess Berg Balance Score to obtain baseline score    Baseline 45/56 on 12/26/21    Time 2    Period Weeks    Status Achieved    Target Date 01/07/22               PT Long Term Goals - 12/24/21 1303       PT LONG TERM GOAL #1   Title independent with advanced HEP    Time 8    Period Weeks    Status New    Target Date 02/18/22      PT LONG TERM GOAL #2   Title Pt will have improved FOTO score to 70    Time 8    Period Weeks    Status New    Target Date 02/18/22      PT LONG TERM GOAL #3   Title Pt will be able to amb at least 1000' in the community with LRAD    Time 8    Period Weeks    Status New    Target Date 02/18/22      PT LONG TERM GOAL #4   Title Pt will demo at least 8 point increase in Berg Balance Score for MDIC    Baseline TBA    Time 8    Period Weeks    Status New    Target Date 02/18/22      PT LONG TERM GOAL #5   Title Pt will demo at least 4/5 strength in R LE    Time 8    Period Weeks    Status New    Target Date 02/18/22                   Plan - 01/04/22 1152     Clinical Impression Statement Pt is 2 weeks post op. Improved SLR to >2" raise from mat table. Pt able to tolerate transition to green tband for his exercises. Worked on gait training with Ina this session with pt demonstrating great reciprocal pattern.    Personal Factors and Comorbidities Age;Fitness;Time since onset of injury/illness/exacerbation    Examination-Activity Limitations Locomotion Level;Transfers;Squat;Stairs;Stand;Lift;Toileting;Carry;Bed Mobility;Bathing    Examination-Participation Restrictions Meal  Prep;Cleaning;Community Activity;Yard Work;Shop;Driving    Stability/Clinical Decision Making Stable/Uncomplicated    Rehab Potential Good    PT Frequency 2x / week    PT Duration 8 weeks    PT Treatment/Interventions ADLs/Self Care Home Management;Aquatic Therapy;Cryotherapy;Electrical Stimulation;Iontophoresis 4mg /ml Dexamethasone;Moist Heat;DME Instruction;Ultrasound;Gait training;Stair training;Functional mobility training;Therapeutic activities;Therapeutic exercise;Balance training;Neuromuscular re-education;Manual techniques;Patient/family education;Dry needling;Passive range of motion;Taping;Vasopneumatic Device    PT Next Visit Plan Work on hip and quad strengthening. Continue to progress pt's resistance as tolerated. Work on [employer] as able.    PT Home Exercise Plan Access Code: [email or web address]    Consulted and Agree with Plan of Care Patient;Family member/caregiver    Family Member Consulted Wife, Izora Gala             Patient will benefit from skilled therapeutic intervention in order to improve the following deficits and impairments:  Abnormal gait, Decreased range of motion, Difficulty walking, Decreased endurance, Pain, Decreased balance, Decreased mobility, Decreased strength, Increased edema, Hypomobility, Impaired flexibility  Visit Diagnosis: Difficulty in walking, not elsewhere classified  Muscle weakness (generalized)  Other abnormalities of gait and mobility  Stiffness of right hip, not elsewhere classified  Pain in right  hip     Problem List Patient Active Problem List   Diagnosis Date Noted   PAF (paroxysmal atrial fibrillation) ([place]) 12/13/2021   Primary osteoarthritis of right hip 10/26/2021   Abnormal liver function tests 09/10/2021   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   Urinary hesitancy 03/08/2021   Thoracic aortic aneurysm without rupture 03/08/2021   Kidney stone    Senile purpura ([place]) 09/07/2020   Aortic atherosclerosis ([place]) 09/07/2020   Sinus bradycardia  03/31/2020   Essential hypertension 03/31/2020   Major depressive disorder 03/07/2020   Essential tremor 03/07/2020   Tremor 01/21/2020   Obesity (BMI 30-39.9) 12/10/2019   Chest pain of uncertain etiology 79/48/0165   Secondary hypercoagulable state ([place]) 09/28/2019   Centrilobular emphysema ([place]) 09/23/2019   Former smoker 09/23/2019   Abnormal findings on diagnostic imaging of lung 09/23/2019   Atrial fibrillation ([place]) 09/23/2019   At risk for sleep apnea 09/23/2019   Shortness of breath 09/23/2019   Nontraumatic incomplete tear of right rotator cuff 10/14/2018   History of adenomatous polyp of colon 03/03/2018   RBBB 06/09/2012   Nephrolithiasis 05/22/2011   Mixed hyperlipidemia 11/20/2009   DDD (degenerative disc disease), cervical 05/13/2008    [facility] April Gordy Levan, PT, DPT 01/04/2022, 11:58 AM  [facility] [place] [address] [place], [place], [address] Phone: [phone]   Fax:  [phone]  Name: Alfonse Garringer [facility] III MRN: [id number] Date of Birth: [birth year]

## 2022-01-08 ENCOUNTER — Other Ambulatory Visit: Payer: Self-pay

## 2022-01-08 ENCOUNTER — Ambulatory Visit: Payer: Medicare HMO | Admitting: Physical Therapy

## 2022-01-08 DIAGNOSIS — M25651 Stiffness of right hip, not elsewhere classified: Secondary | ICD-10-CM

## 2022-01-08 DIAGNOSIS — M6281 Muscle weakness (generalized): Secondary | ICD-10-CM

## 2022-01-08 DIAGNOSIS — M25551 Pain in right hip: Secondary | ICD-10-CM | POA: Diagnosis not present

## 2022-01-08 DIAGNOSIS — R262 Difficulty in walking, not elsewhere classified: Secondary | ICD-10-CM

## 2022-01-08 DIAGNOSIS — R2689 Other abnormalities of gait and mobility: Secondary | ICD-10-CM

## 2022-01-08 NOTE — Therapy (Signed)
[place], [place], [address] Phone: [phone]   Fax:  [phone]  Physical Therapy Treatment  Patient Details  Name: Joshua [name] [facility] III MRN: [id number] Date of Birth: 1946-04-17 Referring Provider (PT): Dorna Leitz   Encounter Date: 01/08/2022   PT End of Session - 01/08/22 1009     Visit Number 5    Number of Visits 16    Date for PT Re-Evaluation 02/18/22    Authorization Type [employer]    Progress Note Due on Visit 10    PT Start Time 1010    PT Stop Time 1055    PT Time Calculation (min) 45 min    Equipment Utilized During Treatment Gait belt    Activity Tolerance Patient tolerated treatment well    Behavior During Therapy [facility] for tasks assessed/performed             Past Medical History:  Diagnosis Date   Abnormal findings on diagnostic imaging of lung 09/23/2019   09/22/2019-lung cancer screening-centrilobular and paraseptal emphysema, calcified granulomata noted bilaterally, no suspicious nodules, lung RADS 1, repeat in 12 months    Anxiety    Atrial fibrillation ([place]) 09/23/2019   Centrilobular emphysema ([place]) 09/23/2019   09/22/2019-lung cancer screening-centrilobular and paraseptal emphysema, calcified granulomata noted bilaterally, no suspicious nodules, lung RADS 1, repeat in 12 months  09/23/2019-FVC 3.74 (81% predicted), postbronchodilator ratio 80, postbronchodilator FEV1 2.88 (86% predicted), no bronchodilator response, DLCO 19.44 (73% predicted)   DDD (degenerative disc disease), cervical 05/13/2008   Cervical Disc Degeneration   Depression    History of adenomatous polyp of colon 03/03/2018   05/14/17: Colonoscopy: nonadvanced adenoma, f/u 5 yrs, use SuPrep, [place]/GAP  Last Assessment & Plan:  Relevant Hx: Course: Daily Update: Today's Plan:   Hyperlipidemia    Kidney stone    Nephrolithiasis 05/22/2011   Nontraumatic incomplete tear of right rotator cuff  10/14/2018   Other and unspecified hyperlipidemia 11/20/2009   Hyperlipidemia   RBBB 06/09/2012   Secondary hypercoagulable state ([place]) 09/28/2019   Shortness of breath 09/23/2019    Past Surgical History:  Procedure Laterality Date   CARDIOVERSION N/A 10/15/2019   Procedure: CARDIOVERSION;  Surgeon: Jerline Pain, MD;  Location: [facility] ENDOSCOPY;  Service: Cardiovascular;  Laterality: N/A;   CARDIOVERSION N/A 11/29/2019   Procedure: CARDIOVERSION;  Surgeon: Dorothy Spark, MD;  Location: [facility] ENDOSCOPY;  Service: Cardiovascular;  Laterality: N/A;   [place] Right 12/21/2021   Procedure: TOTAL HIP ARTHROPLASTY ANTERIOR APPROACH;  Surgeon: Dorna Leitz, MD;  Location: [facility] ORS;  Service: Orthopedics;  Laterality: Right;   VARICOSE VEIN SURGERY Right     There were no vitals filed for this visit.   Subjective Assessment - 01/08/22 1016     Subjective Pt states he still has continued difficulty sleeping. Pt tried to take benadryl but it did not make him drowsy at all. Pt reports he will call doctor. Pt states he has tried the cream and massage    Pertinent History RTC surgery 4 years ago    How long can you sit comfortably? n/a    How long can you stand comfortably? <5 minutes    How long can you walk comfortably? Able to get around house    Patient Stated Goals Cutting grass/yard work, cooking, car maintenance    Currently in Pain? Yes    Pain Score 6     Pain Location Hip  Pain Orientation Right    Pain Descriptors / Indicators Aching;Discomfort    Pain Type Surgical pain    Pain Onset 1 to 4 weeks ago                [facility] PT Assessment - 01/08/22 0001       Assessment   Medical Diagnosis THA    Referring Provider (PT) Berenice Primas, John    Onset Date/Surgical Date 12/21/21    Hand Dominance Right    Next MD Visit 3 more weeks                 Vestibular Assessment - 01/08/22 0001       Positional Testing   Sidelying Test  Sidelying Right;Sidelying Left      Sidelying Right   Sidelying Right Duration 0    Sidelying Right Symptoms No nystagmus      Sidelying Left   Sidelying Left Duration 10    Sidelying Left Symptoms Upbeat, left rotatory nystagmus                      [facility] Adult PT Treatment/Exercise - 01/08/22 0001       Ambulation/Gait   Ambulation/Gait Assistance 5: Supervision    Ambulation Distance (Feet) 240 Feet    Assistive device Straight cane    Gait Pattern Step-through pattern    Ambulation Surface Level;Indoor    Gait Comments Trialed with no a/d -- pt appeared more antalgic      Knee/Hip Exercises: Stretches   Other Knee/Hip Stretches Sidelying TFL/ITB stretch x30 sec    Other Knee/Hip Stretches Figure 4 stretch x30 sec      Knee/Hip Exercises: Aerobic   Nustep L5x5 min LEs/UEs      Knee/Hip Exercises: Supine   Straight Leg Raises Strengthening;Right;10 reps;2 sets;Left      Knee/Hip Exercises: Sidelying   Clams 2x10 R             Vestibular Treatment/Exercise - 01/08/22 0001       Vestibular Treatment/Exercise   Vestibular Treatment Provided Canalith Repositioning    Canalith Repositioning Semont Procedure Left Posterior      Semont Procedure Left Posterior   Number of Reps  2    Overall Response Improved Symptoms                    PT Education - 01/08/22 1247     Education Details Discussed BPPV and how to address it at home.              PT Short Term Goals - 12/26/21 1206       PT SHORT TERM GOAL #1   Title independent with initial HEP    Time 4    Period Weeks    Status New    Target Date 01/21/22      PT SHORT TERM GOAL #2   Title Pt will have improved 5x STS to </=13 sec to demo improved functional LE strength    Baseline 16 sec    Time 4    Period Weeks    Status New    Target Date 01/21/22      PT SHORT TERM GOAL #3   Title Pt will improve TUG score to </=13 sec with LRAD to demo decreased fall risk     Baseline 15 sec    Time 4    Period Weeks    Status New    Target Date 01/21/22  PT SHORT TERM GOAL #4   Title PT will assess Berg Balance Score to obtain baseline score    Baseline 45/56 on 12/26/21    Time 2    Period Weeks    Status Achieved    Target Date 01/07/22               PT Long Term Goals - 01/08/22 1248       PT LONG TERM GOAL #1   Title independent with advanced HEP    Time 8    Period Weeks    Status On-going    Target Date 02/18/22      PT LONG TERM GOAL #2   Title Pt will have improved FOTO score to 70    Time 8    Period Weeks    Status On-going    Target Date 02/18/22      PT LONG TERM GOAL #3   Title Pt will be able to amb at least 1000' in the community with LRAD    Time 8    Period Weeks    Status On-going    Target Date 02/18/22      PT LONG TERM GOAL #4   Title Pt will demo at least 8 point increase in Berg Balance Score for MDIC    Baseline TBA    Time 8    Period Weeks    Status On-going    Target Date 02/18/22      PT LONG TERM GOAL #5   Title Pt will demo at least 4/5 strength in R LE    Time 8    Period Weeks    Status On-going    Target Date 02/18/22      Additional Long Term Goals   Additional Long Term Goals Yes      PT LONG TERM GOAL #6   Title Pt will be (-) with all canalith testing positions to demo resolution of BPPV    Status New    Target Date 02/18/22                   Plan - 01/08/22 1240     Clinical Impression Statement Pt reports continued discomfort along R anterior hip near incision. Provided sidelying TFL stretch to try and address this. Pt has been performing self massage at home as well. Pt with greatly improving strength. Able to amb short distance without any a/d; however, became antalgic after ~25'. Continues to demo good gait pattern with SPC. Discussed weaning him off RW at home and in the community. Pt continues to get dizzy after sup to sit -- assessed him to find pt (+) for L  BPPV. Provided Semont maneuver x 2 this session. Re-cert sent to address new findings.    Personal Factors and Comorbidities Age;Fitness;Time since onset of injury/illness/exacerbation    Examination-Activity Limitations Locomotion Level;Transfers;Squat;Stairs;Stand;Lift;Toileting;Carry;Bed Mobility;Bathing    Examination-Participation Restrictions Meal Prep;Cleaning;Community Activity;Yard Work;Shop;Driving    Stability/Clinical Decision Making Stable/Uncomplicated    Rehab Potential Good    PT Frequency 2x / week    PT Duration 8 weeks    PT Treatment/Interventions ADLs/Self Care Home Management;Aquatic Therapy;Cryotherapy;Electrical Stimulation;Iontophoresis '4mg'$ /ml Dexamethasone;Moist Heat;DME Instruction;Ultrasound;Gait training;Stair training;Functional mobility training;Therapeutic activities;Therapeutic exercise;Balance training;Neuromuscular re-education;Manual techniques;Patient/family education;Dry needling;Passive range of motion;Taping;Vasopneumatic Device;Vestibular    PT Next Visit Plan Assess for L posterior canalith BPPV and treat accordingly. Work on hip and quad strengthening. Continue to progress pt's resistance as tolerated. Work on [employer] as able.    PT  Home Exercise Plan Access Code: [email or web address]    Consulted and Agree with Plan of Care Patient;Family member/caregiver    Family Member Consulted Wife, Izora Gala             Patient will benefit from skilled therapeutic intervention in order to improve the following deficits and impairments:  Abnormal gait, Decreased range of motion, Difficulty walking, Decreased endurance, Pain, Decreased balance, Decreased mobility, Decreased strength, Increased edema, Hypomobility, Impaired flexibility  Visit Diagnosis: Difficulty in walking, not elsewhere classified  Muscle weakness (generalized)  Other abnormalities of gait and mobility  Stiffness of right hip, not elsewhere classified  Pain in right hip     Problem  List Patient Active Problem List   Diagnosis Date Noted   PAF (paroxysmal atrial fibrillation) ([place]) 12/13/2021   Primary osteoarthritis of right hip 10/26/2021   Abnormal liver function tests 09/10/2021   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   Urinary hesitancy 03/08/2021   Thoracic aortic aneurysm without rupture 03/08/2021   Kidney stone    Senile purpura ([place]) 09/07/2020   Aortic atherosclerosis ([place]) 09/07/2020   Sinus bradycardia 03/31/2020   Essential hypertension 03/31/2020   Major depressive disorder 03/07/2020   Essential tremor 03/07/2020   Tremor 01/21/2020   Obesity (BMI 30-39.9) 12/10/2019   Chest pain of uncertain etiology 35/45/6256   Secondary hypercoagulable state ([place]) 09/28/2019   Centrilobular emphysema ([place]) 09/23/2019   Former smoker 09/23/2019   Abnormal findings on diagnostic imaging of lung 09/23/2019   Atrial fibrillation ([place]) 09/23/2019   At risk for sleep apnea 09/23/2019   Shortness of breath 09/23/2019   Nontraumatic incomplete tear of right rotator cuff 10/14/2018   History of adenomatous polyp of colon 03/03/2018   RBBB 06/09/2012   Nephrolithiasis 05/22/2011   Mixed hyperlipidemia 11/20/2009   DDD (degenerative disc disease), cervical 05/13/2008    [facility] April Gordy Levan, PT, DPT 01/08/2022, 12:49 PM  [facility] [place] [address] [place], [place], [address] Phone: [phone]   Fax:  [phone]  Name: Joshua [name] [name] MRN: [id number] Date of Birth: [birth year]

## 2022-01-11 ENCOUNTER — Other Ambulatory Visit: Payer: Self-pay

## 2022-01-11 ENCOUNTER — Ambulatory Visit: Payer: Medicare HMO | Admitting: Physical Therapy

## 2022-01-11 DIAGNOSIS — M25551 Pain in right hip: Secondary | ICD-10-CM | POA: Diagnosis not present

## 2022-01-11 DIAGNOSIS — M25651 Stiffness of right hip, not elsewhere classified: Secondary | ICD-10-CM | POA: Diagnosis not present

## 2022-01-11 DIAGNOSIS — M6281 Muscle weakness (generalized): Secondary | ICD-10-CM | POA: Diagnosis not present

## 2022-01-11 DIAGNOSIS — R2689 Other abnormalities of gait and mobility: Secondary | ICD-10-CM | POA: Diagnosis not present

## 2022-01-11 DIAGNOSIS — R262 Difficulty in walking, not elsewhere classified: Secondary | ICD-10-CM | POA: Diagnosis not present

## 2022-01-11 NOTE — Therapy (Addendum)
[place] [place], [place], [address] Phone: [phone]   Fax:  [phone]  Physical Therapy Treatment  Patient Details  Name: Joshua [name] MRN: [id number] Date of Birth: [birth year] Referring Provider (PT): Dorna Leitz   Encounter Date: 01/11/2022   PT End of Session - 01/11/22 1401     Visit Number 6    Number of Visits 16    Date for PT Re-Evaluation 02/18/22    Authorization Type [employer]    Progress Note Due on Visit 10    PT Start Time 1401    PT Stop Time 1445    PT Time Calculation (min) 44 min    Equipment Utilized During Treatment Gait belt    Activity Tolerance Patient tolerated treatment well    Behavior During Therapy [facility] for tasks assessed/performed             Past Medical History:  Diagnosis Date   Abnormal findings on diagnostic imaging of lung 09/23/2019   09/22/2019-lung cancer screening-centrilobular and paraseptal emphysema, calcified granulomata noted bilaterally, no suspicious nodules, lung RADS 1, repeat in 12 months    Anxiety    Atrial fibrillation ([place]) 09/23/2019   Centrilobular emphysema ([place]) 09/23/2019   09/22/2019-lung cancer screening-centrilobular and paraseptal emphysema, calcified granulomata noted bilaterally, no suspicious nodules, lung RADS 1, repeat in 12 months  09/23/2019-FVC 3.74 (81% predicted), postbronchodilator ratio 80, postbronchodilator FEV1 2.88 (86% predicted), no bronchodilator response, DLCO 19.44 (73% predicted)   DDD (degenerative disc disease), cervical 05/13/2008   Cervical Disc Degeneration   Depression    History of adenomatous polyp of colon 03/03/2018   05/14/17: Colonoscopy: nonadvanced adenoma, f/u 5 yrs, use SuPrep, Murphy/GAP  Last Assessment & Plan:  Relevant Hx: Course: Daily Update: Today's Plan:   Hyperlipidemia    Kidney stone    Nephrolithiasis 05/22/2011   Nontraumatic incomplete tear of right rotator cuff  10/14/2018   Other and unspecified hyperlipidemia 11/20/2009   Hyperlipidemia   RBBB 06/09/2012   Secondary hypercoagulable state ([place]) 09/28/2019   Shortness of breath 09/23/2019    Past Surgical History:  Procedure Laterality Date   CARDIOVERSION N/A 10/15/2019   Procedure: CARDIOVERSION;  Surgeon: Jerline Pain, MD;  Location: [facility] ENDOSCOPY;  Service: Cardiovascular;  Laterality: N/A;   CARDIOVERSION N/A 11/29/2019   Procedure: CARDIOVERSION;  Surgeon: Dorothy Spark, MD;  Location: [facility] ENDOSCOPY;  Service: Cardiovascular;  Laterality: N/A;   [place] Right 12/21/2021   Procedure: TOTAL HIP ARTHROPLASTY ANTERIOR APPROACH;  Surgeon: Dorna Leitz, MD;  Location: [facility] ORS;  Service: Orthopedics;  Laterality: Right;   VARICOSE VEIN SURGERY Right     There were no vitals filed for this visit.   Subjective Assessment - 01/11/22 1403     Subjective Pt has talked to PA to help with his sleep -- has tried it 2 nights. Pt reports continued tightness through the front of the hip.    Pertinent History RTC surgery 4 years ago    How long can you sit comfortably? n/a    How long can you stand comfortably? <5 minutes    How long can you walk comfortably? Able to get around house    Patient Stated Goals Cutting grass/yard work, cooking, car maintenance    Pain Onset 1 to 4 weeks ago                [facility] PT Assessment - 01/11/22 0001  Assessment   Medical Diagnosis THA    Referring Provider (PT) Berenice Primas, John    Onset Date/Surgical Date 12/21/21    Hand Dominance Right    Next MD Visit 3 more weeks                           [facility] Adult PT Treatment/Exercise - 01/11/22 0001       Ambulation/Gait   Ambulation/Gait Assistance 5: Supervision    Ambulation Distance (Feet) 240 Feet    Assistive device None    Gait Pattern Step-through pattern    Ambulation Surface Level;Indoor    Stairs Yes    Stairs Assistance 5:  Supervision    Stair Management Technique Alternating pattern;Two rails    Number of Stairs 15    Height of Stairs --   4-6 in     Knee/Hip Exercises: Stretches   Other Knee/Hip Stretches Figure 4 stretch x30 sec      Knee/Hip Exercises: Aerobic   Nustep L4x5 min LEs/UEs cool down      Knee/Hip Exercises: Standing   Heel Raises 2 sets;10 reps    Wall Squat 2 sets;10 reps    Other Standing Knee Exercises lateral band walk red tband x3 rep near counter, forwards and backwards monster walk x3 reps by counter    Other Standing Knee Exercises tandem stance 3x20 sec each leg      Knee/Hip Exercises: Seated   Sit to Sand 2 sets;10 reps;without UE support      Knee/Hip Exercises: Supine   Heel Slides 10 reps                       PT Short Term Goals - 12/26/21 1206       PT SHORT TERM GOAL #1   Title independent with initial HEP    Time 4    Period Weeks    Status New    Target Date 01/21/22      PT SHORT TERM GOAL #2   Title Pt will have improved 5x STS to </=13 sec to demo improved functional LE strength    Baseline 16 sec    Time 4    Period Weeks    Status New    Target Date 01/21/22      PT SHORT TERM GOAL #3   Title Pt will improve TUG score to </=13 sec with LRAD to demo decreased fall risk    Baseline 15 sec    Time 4    Period Weeks    Status New    Target Date 01/21/22      PT SHORT TERM GOAL #4   Title PT will assess Berg Balance Score to obtain baseline score    Baseline 45/56 on 12/26/21    Time 2    Period Weeks    Status Achieved    Target Date 01/07/22               PT Long Term Goals - 01/08/22 1248       PT LONG TERM GOAL #1   Title independent with advanced HEP    Time 8    Period Weeks    Status On-going    Target Date 02/18/22      PT LONG TERM GOAL #2   Title Pt will have improved FOTO score to 70    Time 8    Period Weeks    Status On-going  Target Date 02/18/22      PT LONG TERM GOAL #3   Title Pt will  be able to amb at least 1000' in the community with LRAD    Time 8    Period Weeks    Status On-going    Target Date 02/18/22      PT LONG TERM GOAL #4   Title Pt will demo at least 8 point increase in Berg Balance Score for MDIC    Baseline TBA    Time 8    Period Weeks    Status On-going    Target Date 02/18/22      PT LONG TERM GOAL #5   Title Pt will demo at least 4/5 strength in R LE    Time 8    Period Weeks    Status On-going    Target Date 02/18/22      Additional Long Term Goals   Additional Long Term Goals Yes      PT LONG TERM GOAL #6   Title Pt will be (-) with all canalith testing positions to demo resolution of BPPV    Status New    Target Date 02/18/22                   Plan - 01/11/22 1440     Clinical Impression Statement Pt reports dizziness has been improving. Session focused on progressing pt's HEP. Tolerated well. Continued to work on amb without a/d and improving single leg stability. Pt able to perform alternating step pattern on stairs.    Personal Factors and Comorbidities Age;Fitness;Time since onset of injury/illness/exacerbation    Examination-Activity Limitations Locomotion Level;Transfers;Squat;Stairs;Stand;Lift;Toileting;Carry;Bed Mobility;Bathing    Examination-Participation Restrictions Meal Prep;Cleaning;Community Activity;Yard Work;Shop;Driving    Stability/Clinical Decision Making Stable/Uncomplicated    Rehab Potential Good    PT Frequency 2x / week    PT Duration 8 weeks    PT Treatment/Interventions ADLs/Self Care Home Management;Aquatic Therapy;Cryotherapy;Electrical Stimulation;Iontophoresis '4mg'$ /ml Dexamethasone;Moist Heat;DME Instruction;Ultrasound;Gait training;Stair training;Functional mobility training;Therapeutic activities;Therapeutic exercise;Balance training;Neuromuscular re-education;Manual techniques;Patient/family education;Dry needling;Passive range of motion;Taping;Vasopneumatic Device;Vestibular    PT Next Visit  Plan Assess for L posterior canalith BPPV and treat accordingly. Work on hip and quad strengthening. Continue to progress pt's resistance as tolerated. Work on [employer] as able.    PT Home Exercise Plan Access Code: [email or web address]    Consulted and Agree with Plan of Care Patient;Family member/caregiver    Family Member Consulted Wife, Izora Gala             Patient will benefit from skilled therapeutic intervention in order to improve the following deficits and impairments:  Abnormal gait, Decreased range of motion, Difficulty walking, Decreased endurance, Pain, Decreased balance, Decreased mobility, Decreased strength, Increased edema, Hypomobility, Impaired flexibility  Visit Diagnosis: Difficulty in walking, not elsewhere classified  Muscle weakness (generalized)  Other abnormalities of gait and mobility  Stiffness of right hip, not elsewhere classified  Pain in right hip     Problem List Patient Active Problem List   Diagnosis Date Noted   PAF (paroxysmal atrial fibrillation) ([place]) 12/13/2021   Primary osteoarthritis of right hip 10/26/2021   Abnormal liver function tests 09/10/2021   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   Urinary hesitancy 03/08/2021   Thoracic aortic aneurysm without rupture 03/08/2021   Kidney stone    Senile purpura ([place]) 09/07/2020   Aortic atherosclerosis ([place]) 09/07/2020   Sinus bradycardia 03/31/2020   Essential hypertension 03/31/2020   Major depressive disorder 03/07/2020   Essential tremor 03/07/2020   Tremor  01/21/2020   Obesity (BMI 30-39.9) 12/10/2019   Chest pain of uncertain etiology 11/88/6773   Secondary hypercoagulable state ([place]) 09/28/2019   Centrilobular emphysema ([place]) 09/23/2019   Former smoker 09/23/2019   Abnormal findings on diagnostic imaging of lung 09/23/2019   Atrial fibrillation ([place]) 09/23/2019   At risk for sleep apnea 09/23/2019   Shortness of breath 09/23/2019   Nontraumatic incomplete tear of right rotator cuff 10/14/2018    History of adenomatous polyp of colon 03/03/2018   RBBB 06/09/2012   Nephrolithiasis 05/22/2011   Mixed hyperlipidemia 11/20/2009   DDD (degenerative disc disease), cervical 05/13/2008    [facility] April Gordy Levan, PT, DPT 01/11/2022, 3:38 PM  [facility] [place], [place], [address] Phone: [phone]   Fax:  [phone]  Name: Joshua [name] [facility] [name] MRN: [id number] Date of Birth: 1946/05/26

## 2022-01-15 ENCOUNTER — Ambulatory Visit: Payer: Medicare HMO | Admitting: Physical Therapy

## 2022-01-15 ENCOUNTER — Other Ambulatory Visit: Payer: Self-pay

## 2022-01-15 DIAGNOSIS — R262 Difficulty in walking, not elsewhere classified: Secondary | ICD-10-CM

## 2022-01-15 DIAGNOSIS — R2689 Other abnormalities of gait and mobility: Secondary | ICD-10-CM

## 2022-01-15 DIAGNOSIS — M25551 Pain in right hip: Secondary | ICD-10-CM | POA: Diagnosis not present

## 2022-01-15 DIAGNOSIS — M6281 Muscle weakness (generalized): Secondary | ICD-10-CM

## 2022-01-15 DIAGNOSIS — M25651 Stiffness of right hip, not elsewhere classified: Secondary | ICD-10-CM

## 2022-01-15 NOTE — Therapy (Signed)
[place] [facility] ?[place], [place], [address] ?Phone: [phone]   Fax:  [phone] ? ?Physical Therapy Treatment ? ?Patient Details  ?Name: Joshua [name] ?MRN: [id number] ?Date of Birth: [birth year] ?Referring Provider (PT): Dorna Leitz ? ? ?Encounter Date: 01/15/2022 ? ? PT End of Session - 01/15/22 1025   ? ? Visit Number 7   ? Number of Visits 16   ? Date for PT Re-Evaluation 02/18/22   ? Authorization Type [employer]   ? Progress Note Due on Visit 10   ? PT Start Time 1023   ? PT Stop Time 1105   ? PT Time Calculation (min) 42 min   ? Equipment Utilized During Treatment Gait belt   ? Activity Tolerance Patient tolerated treatment well   ? Behavior During Therapy [facility] for tasks assessed/performed   ? ?  ?  ? ?  ? ? ?Past Medical History:  ?Diagnosis Date  ? Abnormal findings on diagnostic imaging of lung 09/23/2019  ? 09/22/2019-lung cancer screening-centrilobular and paraseptal emphysema, calcified granulomata noted bilaterally, no suspicious nodules, lung RADS 1, repeat in 12 months   ? Anxiety   ? Atrial fibrillation ([place]) 09/23/2019  ? Centrilobular emphysema ([place]) 09/23/2019  ? 09/22/2019-lung cancer screening-centrilobular and paraseptal emphysema, calcified granulomata noted bilaterally, no suspicious nodules, lung RADS 1, repeat in 12 months  09/23/2019-FVC 3.74 (81% predicted), postbronchodilator ratio 80, postbronchodilator FEV1 2.88 (86% predicted), no bronchodilator response, DLCO 19.44 (73% predicted)  ? DDD (degenerative disc disease), cervical 05/13/2008  ? Cervical Disc Degeneration  ? Depression   ? History of adenomatous polyp of colon 03/03/2018  ? 05/14/17: Colonoscopy: nonadvanced adenoma, f/u 5 yrs, use SuPrep, [place]/GAP  Last Assessment & Plan:  Relevant Hx: Course: Daily Update: Today's Plan:  ? Hyperlipidemia   ? Kidney stone   ? Nephrolithiasis 05/22/2011  ? Nontraumatic incomplete tear of right rotator cuff  10/14/2018  ? Other and unspecified hyperlipidemia 11/20/2009  ? Hyperlipidemia  ? RBBB 06/09/2012  ? Secondary hypercoagulable state ([place]) 09/28/2019  ? Shortness of breath 09/23/2019  ? ? ?Past Surgical History:  ?Procedure Laterality Date  ? CARDIOVERSION N/A 10/15/2019  ? Procedure: CARDIOVERSION;  Surgeon: Jerline Pain, MD;  Location: [facility] ENDOSCOPY;  Service: Cardiovascular;  Laterality: N/A;  ? CARDIOVERSION N/A 11/29/2019  ? Procedure: CARDIOVERSION;  Surgeon: Dorothy Spark, MD;  Location: [facility];  Service: Cardiovascular;  Laterality: N/A;  ? HEMORRHOID SURGERY    ? 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 Procedure: TOTAL HIP ARTHROPLASTY ANTERIOR APPROACH;  Surgeon: Dorna Leitz, MD;  Location: [facility] ORS;  Service: Orthopedics;  Laterality: Right;  ? VARICOSE VEIN SURGERY Right   ? ? ?There were no vitals filed for this visit. ? ? Subjective Assessment - 01/15/22 1026   ? ? Subjective Pt reports continued R hip tightness. Has been able to sleep better.   ? Pertinent History RTC surgery 4 years ago   ? How long can you sit comfortably? n/a   ? How long can you stand comfortably? <5 minutes   ? How long can you walk comfortably? Able to get around house   ? Patient Stated Goals Cutting grass/yard work, cooking, car maintenance   ? Currently in Pain? Yes   ? Pain Score 2    ? Pain Location Hip   ? Pain Orientation Right   ? Pain Descriptors / Indicators Tightness   ? Pain Type Surgical pain   ? Pain Onset  1 to 4 weeks ago   ? ?  ?  ? ?  ? ? ? ? ? [facility] PT Assessment - 01/15/22 0001   ? ?  ? Assessment  ? Medical Diagnosis THA   ? Referring Provider (PT) Dorna Leitz   ? Onset Date/Surgical Date 12/21/21   ? Hand Dominance Right   ? Next MD Visit 3 more weeks   ? ?  ?  ? ?  ? ? ? ? ? ? ? ? ? ? ? ? ? ? ? ? [place] Adult PT Treatment/Exercise - 01/15/22 0001   ? ?  ? Ambulation/Gait  ? Gait Comments Retro gait x 1 lap around gym to increase hip and lumbar extension   ?  ? Knee/Hip Exercises: Stretches  ?  Quad Stretch Right;30 seconds;2 reps   ? Quad Stretch Limitations supine with strap and then prone with strap   ? Hip Flexor Stretch Right;60 seconds   ? Hip Flexor Stretch Limitations supine   ? Other Knee/Hip Stretches Hip flexion stretch into counter 2x20 sec   ? Other Knee/Hip Stretches Figure 4 stretch x30 sec   ?  ? Knee/Hip Exercises: Aerobic  ? Nustep L6 x 5 min LEs/UEs   ?  ? Knee/Hip Exercises: Standing  ? Other Standing Knee Exercises Mini counter squat 2x10   ? Other Standing Knee Exercises Glutes on/off wall with setting x10; Mini deadlifts with 5# x10   ?  ? Knee/Hip Exercises: Prone  ? Hamstring Curl 2 sets;10 reps   ? Hamstring Curl Limitations green tband   ? Hip Extension Strengthening;2 sets;10 reps   ?  ? Manual Therapy  ? Manual therapy comments gentle STM and TPR along R TFL and proximal quad near pt's incision   ? ?  ?  ? ?  ? ? ? ? ? ? ? ? ? ? ? ? PT Short Term Goals - 12/26/21 1206   ? ?  ? PT SHORT TERM GOAL #1  ? Title independent with initial HEP   ? Time 4   ? Period Weeks   ? Status New   ? Target Date 01/21/22   ?  ? PT SHORT TERM GOAL #2  ? Title Pt will have improved 5x STS to </=13 sec to demo improved functional LE strength   ? Baseline 16 sec   ? Time 4   ? Period Weeks   ? Status New   ? Target Date 01/21/22   ?  ? PT SHORT TERM GOAL #3  ? Title Pt will improve TUG score to </=13 sec with LRAD to demo decreased fall risk   ? Baseline 15 sec   ? Time 4   ? Period Weeks   ? Status New   ? Target Date 01/21/22   ?  ? PT SHORT TERM GOAL #4  ? Title PT will assess Berg Balance Score to obtain baseline score   ? Baseline 45/56 on 12/26/21   ? Time 2   ? Period Weeks   ? Status Achieved   ? Target Date 01/07/22   ? ?  ?  ? ?  ? ? ? ? PT Long Term Goals - 01/08/22 1248   ? ?  ? PT LONG TERM GOAL #1  ? Title independent with advanced HEP   ? Time 8   ? Period Weeks   ? Status On-going   ? Target Date 02/18/22   ?  ? PT LONG TERM  GOAL #2  ? Title Pt will have improved FOTO score to 70   ?  Time 8   ? Period Weeks   ? Status On-going   ? Target Date 02/18/22   ?  ? PT LONG TERM GOAL #3  ? Title Pt will be able to amb at least 1000' in the community with [place]   ? Time 8   ? Period Weeks   ? Status On-going   ? Target Date 02/18/22   ?  ? PT LONG TERM GOAL #4  ? Title Pt will demo at least 8 point increase in [place] Score for MDIC   ? Baseline TBA   ? Time 8   ? Period Weeks   ? Status On-going   ? Target Date 02/18/22   ?  ? PT LONG TERM GOAL #5  ? Title Pt will demo at least 4/5 strength in R LE   ? Time 8   ? Period Weeks   ? Status On-going   ? Target Date 02/18/22   ?  ? Additional Long Term Goals  ? Additional Long Term Goals Yes   ?  ? PT LONG TERM GOAL #6  ? Title Pt will be (-) with all canalith testing positions to demo resolution of BPPV   ? Status New   ? Target Date 02/18/22   ? ?  ?  ? ?  ? ? ? ? ? ? ? ? Plan - 01/15/22 1105   ? ? Clinical Impression Statement Increased focus on hip/lumbar extension and hamstrings to offset pt's anterior hip tightness. Initiated mini deadlifts this session. Pt with difficulty performing squats with good form (knees go past toes) and requires cueing. Pt with improving strength.   ? Personal Factors and Comorbidities Age;Fitness;Time since onset of injury/illness/exacerbation   ? Examination-Activity Limitations Locomotion Level;Transfers;Squat;Stairs;Stand;Lift;Toileting;Carry;Bed Mobility;Bathing   ? Examination-Participation Restrictions Meal Prep;Cleaning;Community Activity;Yard Work;Shop;Driving   ? PT Treatment/Interventions ADLs/Self Care Home Management;Aquatic Therapy;Cryotherapy;Electrical Stimulation;Iontophoresis '4mg'$ /ml Dexamethasone;Moist Heat;DME Instruction;Ultrasound;Gait training;Stair training;Functional mobility training;Therapeutic activities;Therapeutic exercise;Balance training;Neuromuscular re-education;Manual techniques;Patient/family education;Dry needling;Passive range of motion;Taping;Vasopneumatic Device;Vestibular   ? PT  Next Visit Plan Assess for L posterior canalith BPPV and treat accordingly. Work on hip and quad strengthening. Continue to progress pt's resistance as tolerated. Work on [employer] as able.   ? PT Home Ex

## 2022-01-18 ENCOUNTER — Ambulatory Visit: Payer: Medicare HMO | Admitting: Physical Therapy

## 2022-01-18 ENCOUNTER — Other Ambulatory Visit: Payer: Self-pay

## 2022-01-18 DIAGNOSIS — M25651 Stiffness of right hip, not elsewhere classified: Secondary | ICD-10-CM | POA: Diagnosis not present

## 2022-01-18 DIAGNOSIS — R2689 Other abnormalities of gait and mobility: Secondary | ICD-10-CM

## 2022-01-18 DIAGNOSIS — M6281 Muscle weakness (generalized): Secondary | ICD-10-CM

## 2022-01-18 DIAGNOSIS — R262 Difficulty in walking, not elsewhere classified: Secondary | ICD-10-CM | POA: Diagnosis not present

## 2022-01-18 DIAGNOSIS — M25551 Pain in right hip: Secondary | ICD-10-CM | POA: Diagnosis not present

## 2022-01-18 NOTE — Therapy (Signed)
[facility] ?[place] ?[place], [place], [address] ?Phone: [phone]   Fax:  [phone] ? ?Physical Therapy Treatment ? ?Patient Details  ?Name: Joshua [name] ?MRN: [id number] ?Date of Birth: Mar 23, 1946 ?Referring Provider (PT): Dorna Leitz ? ? ?Encounter Date: 01/18/2022 ? ? PT End of Session - 01/18/22 1104   ? ? Visit Number 8   ? Number of Visits 16   ? Date for PT Re-Evaluation 02/18/22   ? Authorization Type [employer]   ? Progress Note Due on Visit 10   ? PT Start Time 1104   ? PT Stop Time 1145   ? PT Time Calculation (min) 41 min   ? Equipment Utilized During Treatment Gait belt   ? Activity Tolerance Patient tolerated treatment well   ? Behavior During Therapy [facility] for tasks assessed/performed   ? ?  ?  ? ?  ? ? ?Past Medical History:  ?Diagnosis Date  ? Abnormal findings on diagnostic imaging of lung 09/23/2019  ? 09/22/2019-lung cancer screening-centrilobular and paraseptal emphysema, calcified granulomata noted bilaterally, no suspicious nodules, lung RADS 1, repeat in 12 months   ? Anxiety   ? Atrial fibrillation ([place]) 09/23/2019  ? Centrilobular emphysema ([place]) 09/23/2019  ? 09/22/2019-lung cancer screening-centrilobular and paraseptal emphysema, calcified granulomata noted bilaterally, no suspicious nodules, lung RADS 1, repeat in 12 months  09/23/2019-FVC 3.74 (81% predicted), postbronchodilator ratio 80, postbronchodilator FEV1 2.88 (86% predicted), no bronchodilator response, DLCO 19.44 (73% predicted)  ? DDD (degenerative disc disease), cervical 05/13/2008  ? Cervical Disc Degeneration  ? Depression   ? History of adenomatous polyp of colon 03/03/2018  ? 05/14/17: Colonoscopy: nonadvanced adenoma, f/u 5 yrs, use SuPrep, Murphy/GAP  Last Assessment & Plan:  Relevant Hx: Course: Daily Update: Today's Plan:  ? Hyperlipidemia   ? Kidney stone   ? Nephrolithiasis 05/22/2011  ? Nontraumatic incomplete tear of right rotator cuff  10/14/2018  ? Other and unspecified hyperlipidemia 11/20/2009  ? Hyperlipidemia  ? RBBB 06/09/2012  ? Secondary hypercoagulable state ([place]) 09/28/2019  ? Shortness of breath 09/23/2019  ? ? ?Past Surgical History:  ?Procedure Laterality Date  ? CARDIOVERSION N/A 10/15/2019  ? Procedure: CARDIOVERSION;  Surgeon: Jerline Pain, MD;  Location: [facility] ENDOSCOPY;  Service: Cardiovascular;  Laterality: N/A;  ? CARDIOVERSION N/A 11/29/2019  ? Procedure: CARDIOVERSION;  Surgeon: Dorothy Spark, MD;  Location: [place];  Service: Cardiovascular;  Laterality: N/A;  ? HEMORRHOID SURGERY    ? 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 Procedure: TOTAL HIP ARTHROPLASTY ANTERIOR APPROACH;  Surgeon: Dorna Leitz, MD;  Location: [facility];  Service: Orthopedics;  Laterality: Right;  ? VARICOSE VEIN SURGERY Right   ? ? ?There were no vitals filed for this visit. ? ? Subjective Assessment - 01/18/22 1105   ? ? Subjective Pt states he is to follow up with Dr. Carlean Purl 01/31/22. Has continued R hip swelling. Pt reports he was able to walk to the mailbox without the cane. Continued numbness along incision.   ? Pertinent History RTC surgery 4 years ago   ? How long can you sit comfortably? n/a   ? How long can you stand comfortably? <5 minutes   ? How long can you walk comfortably? Able to get around house   ? Patient Stated Goals Cutting grass/yard work, cooking, car maintenance   ? Currently in Pain? Yes   ? Pain Score 1    ? Pain Location Hip   ? Pain Orientation  Right   ? Pain Descriptors / Indicators Tightness   ? Pain Type Surgical pain   ? Pain Onset 1 to 4 weeks ago   ? ?  ?  ? ?  ? ? ? ? ? [facility] PT Assessment - 01/18/22 0001   ? ?  ? Assessment  ? Medical Diagnosis THA   ? Referring Provider (PT) Dorna Leitz   ? Onset Date/Surgical Date 12/21/21   ? Hand Dominance Right   ? Next MD Visit 3 more weeks   ?  ? Transfers  ? Five time sit to stand comments  11   ?  ? Timed Up and Go Test  ? Normal TUG (seconds) 8   ? ?  ?  ? ?   ? ? ? ? ? ? ? ? ? ? ? ? ? ? ? ? [facility] Adult PT Treatment/Exercise - 01/18/22 0001   ? ?  ? Knee/Hip Exercises: Stretches  ? Hip Flexor Stretch Right;30 seconds;2 reps   ? Hip Flexor Stretch Limitations on step   ?  ? Knee/Hip Exercises: Machines for Strengthening  ? Cybex Knee Extension 3 plates 3x10 SL   ? Total Gym Leg Press 4 plates x10 DL; 5 plates x10 DL; 4 plates 3x10 SL eccentrics then concentrics   ?  ? Knee/Hip Exercises: Standing  ? Knee Flexion Right;10 reps;3 sets   ? Knee Flexion Limitations green tband   ? SLS 3x20 sec   ? Other Standing Knee Exercises Mini deadlifts 10# KB x20; 5# KB x10   ? ?  ?  ? ?  ? ? ? ? ? ? ? ? ? ? ? ? PT Short Term Goals - 01/18/22 1137   ? ?  ? PT SHORT TERM GOAL #1  ? Title independent with initial HEP   ? Time 4   ? Period Weeks   ? Status Achieved   ? Target Date 01/21/22   ?  ? PT SHORT TERM GOAL #2  ? Title Pt will have improved 5x STS to </=13 sec to demo improved functional LE strength   ? Baseline 11 sec   ? Time 4   ? Period Weeks   ? Status Achieved   ? Target Date 01/21/22   ?  ? PT SHORT TERM GOAL #3  ? Title Pt will improve TUG score to </=13 sec with LRAD to demo decreased fall risk   ? Baseline 8 sec   ? Time 4   ? Period Weeks   ? Status Achieved   ? Target Date 01/21/22   ?  ? PT SHORT TERM GOAL #4  ? Title PT will assess Berg Balance Score to obtain baseline score   ? Baseline 45/56 on 12/26/21   ? Time 2   ? Period Weeks   ? Status Achieved   ? Target Date 01/07/22   ? ?  ?  ? ?  ? ? ? ? PT Long Term Goals - 01/08/22 1248   ? ?  ? PT LONG TERM GOAL #1  ? Title independent with advanced HEP   ? Time 8   ? Period Weeks   ? Status On-going   ? Target Date 02/18/22   ?  ? PT LONG TERM GOAL #2  ? Title Pt will have improved FOTO score to 70   ? Time 8   ? Period Weeks   ? Status On-going   ? Target Date 02/18/22   ?  ?  PT LONG TERM GOAL #3  ? Title Pt will be able to amb at least 1000' in the community with [place]   ? Time 8   ? Period Weeks   ? Status On-going   ?  Target Date 02/18/22   ?  ? PT LONG TERM GOAL #4  ? Title Pt will demo at least 8 point increase in [place] Score for MDIC   ? Baseline TBA   ? Time 8   ? Period Weeks   ? Status On-going   ? Target Date 02/18/22   ?  ? PT LONG TERM GOAL #5  ? Title Pt will demo at least 4/5 strength in R LE   ? Time 8   ? Period Weeks   ? Status On-going   ? Target Date 02/18/22   ?  ? Additional Long Term Goals  ? Additional Long Term Goals Yes   ?  ? PT LONG TERM GOAL #6  ? Title Pt will be (-) with all canalith testing positions to demo resolution of BPPV   ? Status New   ? Target Date 02/18/22   ? ?  ?  ? ?  ? ? ? ? ? ? ? ? Plan - 01/18/22 1118   ? ? Clinical Impression Statement Initiated machine strengthening this session with good tolerance. Continued to work on improving deadlifts. Pt has met all of his STGs.   ? Personal Factors and Comorbidities Age;Fitness;Time since onset of injury/illness/exacerbation   ? Examination-Activity Limitations Locomotion Level;Transfers;Squat;Stairs;Stand;Lift;Toileting;Carry;Bed Mobility;Bathing   ? Examination-Participation Restrictions Meal Prep;Cleaning;Community Activity;Yard Work;Shop;Driving   ? Stability/Clinical Decision Making Stable/Uncomplicated   ? Rehab Potential Good   ? PT Frequency 2x / week   ? PT Duration 8 weeks   ? PT Treatment/Interventions ADLs/Self Care Home Management;Aquatic Therapy;Cryotherapy;Electrical Stimulation;Iontophoresis 39m/ml Dexamethasone;Moist Heat;DME Instruction;Ultrasound;Gait training;Stair training;Functional mobility training;Therapeutic activities;Therapeutic exercise;Balance training;Neuromuscular re-education;Manual techniques;Patient/family education;Dry needling;Passive range of motion;Taping;Vasopneumatic Device;Vestibular   ? PT Next Visit Plan Assess for L posterior canalith BPPV and treat accordingly. Work on hip and quad strengthening. Continue to progress pt's resistance as tolerated.   ? PT Home Exercise Plan Access Code:  [email or web address]  ? Consulted and Agree with Plan of Care Patient;Family member/caregiver   ? Family Member Consulted Wife, NIzora Gala  ? ?  ?  ? ?  ? ? ?Patient will benefit from skilled therapeutic intervention in order to im

## 2022-01-21 ENCOUNTER — Other Ambulatory Visit: Payer: Self-pay

## 2022-01-21 ENCOUNTER — Ambulatory Visit: Payer: Medicare HMO | Admitting: Physical Therapy

## 2022-01-21 DIAGNOSIS — M6281 Muscle weakness (generalized): Secondary | ICD-10-CM

## 2022-01-21 DIAGNOSIS — M25651 Stiffness of right hip, not elsewhere classified: Secondary | ICD-10-CM | POA: Diagnosis not present

## 2022-01-21 DIAGNOSIS — R262 Difficulty in walking, not elsewhere classified: Secondary | ICD-10-CM | POA: Diagnosis not present

## 2022-01-21 DIAGNOSIS — R2689 Other abnormalities of gait and mobility: Secondary | ICD-10-CM

## 2022-01-21 DIAGNOSIS — M25551 Pain in right hip: Secondary | ICD-10-CM | POA: Diagnosis not present

## 2022-01-21 NOTE — Therapy (Signed)
[place] [facility] ?[place] ?[name], [place], [address] ?Phone: [phone]   Fax:  [phone] ? ?Physical Therapy Treatment ? ?Patient Details  ?Name: Isaiahs Chancy Hewett III ?MRN: [id number] ?Date of Birth: January 26, 1946 ?Referring Provider (PT): Dorna Leitz ? ? ?Encounter Date: 01/21/2022 ? ? PT End of Session - 01/21/22 9983   ? ? Visit Number 9   ? Number of Visits 16   ? Date for PT Re-Evaluation 02/18/22   ? Authorization Type [employer]   ? Progress Note Due on Visit 10   ? PT Start Time [phone]   ? PT Stop Time 1015   ? PT Time Calculation (min) 47 min   ? Equipment Utilized During Treatment Gait belt   ? Activity Tolerance Patient tolerated treatment well   ? Behavior During Therapy [facility] for tasks assessed/performed   ? ?  ?  ? ?  ? ? ?Past Medical History:  ?Diagnosis Date  ? Abnormal findings on diagnostic imaging of lung 09/23/2019  ? 09/22/2019-lung cancer screening-centrilobular and paraseptal emphysema, calcified granulomata noted bilaterally, no suspicious nodules, lung RADS 1, repeat in 12 months   ? Anxiety   ? Atrial fibrillation ([place]) 09/23/2019  ? Centrilobular emphysema ([place]) 09/23/2019  ? 09/22/2019-lung cancer screening-centrilobular and paraseptal emphysema, calcified granulomata noted bilaterally, no suspicious nodules, lung RADS 1, repeat in 12 months  09/23/2019-FVC 3.74 (81% predicted), postbronchodilator ratio 80, postbronchodilator FEV1 2.88 (86% predicted), no bronchodilator response, DLCO 19.44 (73% predicted)  ? DDD (degenerative disc disease), cervical 05/13/2008  ? Cervical Disc Degeneration  ? Depression   ? History of adenomatous polyp of colon 03/03/2018  ? 05/14/17: Colonoscopy: nonadvanced adenoma, f/u 5 yrs, use SuPrep, [place]/GAP  Last Assessment & Plan:  Relevant Hx: Course: Daily Update: Today's Plan:  ? Hyperlipidemia   ? Kidney stone   ? Nephrolithiasis 05/22/2011  ? Nontraumatic incomplete tear of right rotator cuff  10/14/2018  ? Other and unspecified hyperlipidemia 11/20/2009  ? Hyperlipidemia  ? RBBB 06/09/2012  ? Secondary hypercoagulable state ([place]) 09/28/2019  ? Shortness of breath 09/23/2019  ? ? ?Past Surgical History:  ?Procedure Laterality Date  ? CARDIOVERSION N/A 10/15/2019  ? Procedure: CARDIOVERSION;  Surgeon: Jerline Pain, MD;  Location[facility] ENDOSCOPY;  Service: Cardiovascular;  Laterality: N/A;  ? CARDIOVERSION N/A 11/29/2019  ? Procedure: CARDIOVERSION;  Surgeon: Dorothy Spark, MD;  Location: [place];  Service: Cardiovascular;  Laterality: N/A;  ? HEMORRHOID SURGERY    ? 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 Procedure: TOTAL HIP ARTHROPLASTY ANTERIOR APPROACH;  Surgeon: Dorna Leitz, MD;  Location: [facility];  Service: Orthopedics;  Laterality: Right;  ? VARICOSE VEIN SURGERY Right   ? ? ?There were no vitals filed for this visit. ? ? Subjective Assessment - 01/21/22 0930   ? ? Subjective Pt reports continued numbness along incision. Pt reports some pain with his hip stretches. Pt got some walking done in [facility] and felt fine doing it.   ? Pertinent History RTC surgery 4 years ago   ? How long can you sit comfortably? n/a   ? How long can you stand comfortably? <5 minutes   ? How long can you walk comfortably? Able to get around house   ? Patient Stated Goals Cutting grass/yard work, cooking, car maintenance   ? Currently in Pain? Yes   ? Pain Score 2    ? Pain Location Hip   ? Pain Orientation Right   ? Pain Onset  1 to 4 weeks ago   ? ?  ?  ? ?  ? ? ? ? ? [facility] PT Assessment - 01/21/22 0001   ? ?  ? Assessment  ? Medical Diagnosis THA   ? Referring Provider (PT) Dorna Leitz   ? Onset Date/Surgical Date 12/21/21   ? Hand Dominance Right   ? Next MD Visit 3 more weeks   ? ?  ?  ? ?  ? ? ? ? ? ? Vestibular Assessment - 01/21/22 0001   ? ?  ? Positional Testing  ? Dix-Hallpike Dix-Hallpike Left   ?  ? Dix-Hallpike Left  ? Dix-Hallpike Left Duration 12   ? Dix-Hallpike Left Symptoms Upbeat, left  rotatory nystagmus   ? ?  ?  ? ?  ? ? ? ? ? ? ? ? ? ? ? [facility] Adult PT Treatment/Exercise - 01/21/22 0001   ? ?  ? Knee/Hip Exercises: Aerobic  ? Tread Mill 2.0 mph x 5 min (grade 2 x 2 min) cool down   ? Nustep L6 x 5 min LEs/UEs warm up   ?  ? Knee/Hip Exercises: Machines for Strengthening  ? Cybex Knee Extension 3 plates 2x10 SL   ? Total Gym Leg Press DL 5 plates 2x5; SL 5 plates 2x10 on R   ?  ? Knee/Hip Exercises: Standing  ? Lateral Step Up Right;Left;2 sets;10 reps;Step Height: 4"   ? Forward Step Up Right;Left;2 sets;10 reps;Step Height: 4"   ? Forward Step Up Limitations runner's step up   ? ?  ?  ? ?  ? ? Vestibular Treatment/Exercise - 01/21/22 0001   ? ?  ? Vestibular Treatment/Exercise  ? Vestibular Treatment Provided Canalith Repositioning   ? Canalith Repositioning Epley Manuever Left   ?  ?  EPLEY MANUEVER LEFT  ? Number of Reps  2   ? Overall Response  Improved Symptoms   ?  RESPONSE DETAILS LEFT smaller beats of nystagmus with shorter duration on 2nd Epley maneuver   ? ?  ?  ? ?  ? ? ? ? ? Balance Exercises - 01/21/22 0001   ? ?  ? Balance Exercises: Standing  ? Tandem Stance Eyes open;Foam/compliant surface;2 reps;30 secs   ? Tandem Gait Forward;Retro;3 reps   by counter  ? Other Standing Exercises "grapevine" x 3 reps by counter   ? ?  ?  ? ?  ? ? ? ? ? ? ? PT Short Term Goals - 01/18/22 1137   ? ?  ? PT SHORT TERM GOAL #1  ? Title independent with initial HEP   ? Time 4   ? Period Weeks   ? Status Achieved   ? Target Date 01/21/22   ?  ? PT SHORT TERM GOAL #2  ? Title Pt will have improved 5x STS to </=13 sec to demo improved functional LE strength   ? Baseline 11 sec   ? Time 4   ? Period Weeks   ? Status Achieved   ? Target Date 01/21/22   ?  ? PT SHORT TERM GOAL #3  ? Title Pt will improve TUG score to </=13 sec with LRAD to demo decreased fall risk   ? Baseline 8 sec   ? Time 4   ? Period Weeks   ? Status Achieved   ? Target Date 01/21/22   ?  ? PT SHORT TERM GOAL #4  ? Title PT will assess  Berg Balance Score  to obtain baseline score   ? Baseline 45/56 on 12/26/21   ? Time 2   ? Period Weeks   ? Status Achieved   ? Target Date 01/07/22   ? ?  ?  ? ?  ? ? ? ? PT Long Term Goals - 01/08/22 1248   ? ?  ? PT LONG TERM GOAL #1  ? Title independent with advanced HEP   ? Time 8   ? Period Weeks   ? Status On-going   ? Target Date 02/18/22   ?  ? PT LONG TERM GOAL #2  ? Title Pt will have improved FOTO score to 70   ? Time 8   ? Period Weeks   ? Status On-going   ? Target Date 02/18/22   ?  ? PT LONG TERM GOAL #3  ? Title Pt will be able to amb at least 1000' in the community with [place]   ? Time 8   ? Period Weeks   ? Status On-going   ? Target Date 02/18/22   ?  ? PT LONG TERM GOAL #4  ? Title Pt will demo at least 8 point increase in [place] Score for MDIC   ? Baseline TBA   ? Time 8   ? Period Weeks   ? Status On-going   ? Target Date 02/18/22   ?  ? PT LONG TERM GOAL #5  ? Title Pt will demo at least 4/5 strength in R LE   ? Time 8   ? Period Weeks   ? Status On-going   ? Target Date 02/18/22   ?  ? Additional Long Term Goals  ? Additional Long Term Goals Yes   ?  ? PT LONG TERM GOAL #6  ? Title Pt will be (-) with all canalith testing positions to demo resolution of BPPV   ? Status New   ? Target Date 02/18/22   ? ?  ?  ? ?  ? ? ? ? ? ? ? ? Plan - 01/21/22 0955   ? ? Clinical Impression Statement Rechecked pt's BPPV this session. Some continued posterior canalithiasis noted -- addressed with Epley maneuver this session. R hip continues to be improving in strength. Has continued N/T on R anterior hip. Discussed trialing taping when all of pt's scabs have fallen off.   ? Personal Factors and Comorbidities Age;Fitness;Time since onset of injury/illness/exacerbation   ? Examination-Activity Limitations Locomotion Level;Transfers;Squat;Stairs;Stand;Lift;Toileting;Carry;Bed Mobility;Bathing   ? Examination-Participation Restrictions Meal Prep;Cleaning;Community Activity;Yard Work;Shop;Driving   ?  Stability/Clinical Decision Making Stable/Uncomplicated   ? Rehab Potential Good   ? PT Frequency 2x / week   ? PT Duration 8 weeks   ? PT Treatment/Interventions ADLs/Self Care Home Management;Aquatic Therapy;Cryot

## 2022-01-24 ENCOUNTER — Other Ambulatory Visit: Payer: Self-pay

## 2022-01-24 ENCOUNTER — Ambulatory Visit: Payer: Medicare HMO | Admitting: Physical Therapy

## 2022-01-24 DIAGNOSIS — M25651 Stiffness of right hip, not elsewhere classified: Secondary | ICD-10-CM

## 2022-01-24 DIAGNOSIS — R2689 Other abnormalities of gait and mobility: Secondary | ICD-10-CM

## 2022-01-24 DIAGNOSIS — M6281 Muscle weakness (generalized): Secondary | ICD-10-CM

## 2022-01-24 DIAGNOSIS — R262 Difficulty in walking, not elsewhere classified: Secondary | ICD-10-CM

## 2022-01-24 DIAGNOSIS — M25551 Pain in right hip: Secondary | ICD-10-CM

## 2022-01-24 NOTE — Therapy (Signed)
[facility] [place] [address] [place], [place], [address] Phone: [phone]   Fax:  [phone]  Physical Therapy Treatment and 10th Visit PN  Patient Details  Name: Joshua [name] [facility] III MRN: [id number] Date of Birth: Oct 11, 1946 Referring Provider (PT): Jodi Geralds  Progress Note Reporting Period 12/24/2021 to 01/24/2022  See note below for Objective Data and Assessment of Progress/Goals.      Encounter Date: 01/24/2022   PT End of Session - 01/24/22 0929     Visit Number 10    Number of Visits 16    Date for PT Re-Evaluation 02/18/22    Authorization Type [employer]    Progress Note Due on Visit 10    PT Start Time 0930    PT Stop Time 1015    PT Time Calculation (min) 45 min    Equipment Utilized During Treatment Gait belt    Activity Tolerance Patient tolerated treatment well    Behavior During Therapy WFL for tasks assessed/performed             Past Medical History:  Diagnosis Date   Abnormal findings on diagnostic imaging of lung 09/23/2019   09/22/2019-lung cancer screening-centrilobular and paraseptal emphysema, calcified granulomata noted bilaterally, no suspicious nodules, lung RADS 1, repeat in 12 months    Anxiety    Atrial fibrillation (HCC) 09/23/2019   Centrilobular emphysema (HCC) 09/23/2019   09/22/2019-lung cancer screening-centrilobular and paraseptal emphysema, calcified granulomata noted bilaterally, no suspicious nodules, lung RADS 1, repeat in 12 months  09/23/2019-FVC 3.74 (81% predicted), postbronchodilator ratio 80, postbronchodilator FEV1 2.88 (86% predicted), no bronchodilator response, DLCO 19.44 (73% predicted)   DDD (degenerative disc disease), cervical 05/13/2008   Cervical Disc Degeneration   Depression    History of adenomatous polyp of colon 03/03/2018   05/14/17: Colonoscopy: nonadvanced adenoma, f/u 5 yrs, use SuPrep, Murphy/GAP  Last Assessment & Plan:  Relevant Hx:  Course: Daily Update: Today's Plan:   Hyperlipidemia    Kidney stone    Nephrolithiasis 05/22/2011   Nontraumatic incomplete tear of right rotator cuff 10/14/2018   Other and unspecified hyperlipidemia 11/20/2009   Hyperlipidemia   RBBB 06/09/2012   Secondary hypercoagulable state (HCC) 09/28/2019   Shortness of breath 09/23/2019    Past Surgical History:  Procedure Laterality Date   CARDIOVERSION N/A 10/15/2019   Procedure: CARDIOVERSION;  Surgeon: Jake Bathe, MD;  Location[facility] ENDOSCOPY;  Service: Cardiovascular;  Laterality: N/A;   CARDIOVERSION N/A 11/29/2019   Procedure: CARDIOVERSION;  Surgeon: Lars Masson, MD;  Location: [facility] ENDOSCOPY;  Service: Cardiovascular;  Laterality: N/A;   HEMORRHOID SURGERY     TOTAL HIP ARTHROPLASTY Right 12/21/2021   Procedure: TOTAL HIP ARTHROPLASTY ANTERIOR APPROACH;  Surgeon: Jodi Geralds, MD;  Location: [facility] ORS;  Service: Orthopedics;  Laterality: Right;   VARICOSE VEIN SURGERY Right     There were no vitals filed for this visit.   Subjective Assessment - 01/24/22 0932     Subjective Pt reports some increased soreness this morning. Pt states he was able to sleep throughout the night for the first time on just 2 tylenol.    Pertinent History RTC surgery 4 years ago    How long can you sit comfortably? n/a    How long can you stand comfortably? <5 minutes    How long can you walk comfortably? Able to get around house    Patient Stated Goals Cutting grass/yard work, cooking, car maintenance    Currently in Pain? Yes  Pain Score 3     Pain Location Hip    Pain Orientation Right    Pain Descriptors / Indicators Sore    Pain Type Surgical pain    Pain Onset 1 to 4 weeks ago                [facility] PT Assessment - 01/24/22 0001       Assessment   Medical Diagnosis THA    Referring Provider (PT) Luiz Blare, Joshua    Onset Date/Surgical Date 12/21/21    Hand Dominance Right      Berg Balance Test   Sit to Stand Able to stand  without using hands and stabilize independently    Standing Unsupported Able to stand safely 2 minutes    Sitting with Back Unsupported but Feet Supported on Floor or Stool Able to sit safely and securely 2 minutes    Stand to Sit Sits safely with minimal use of hands    Transfers Able to transfer safely, minor use of hands    Standing Unsupported with Eyes Closed Able to stand 10 seconds safely    Standing Unsupported with Feet Together Able to place feet together independently and stand 1 minute safely    From Standing, Reach Forward with Outstretched Arm Can reach confidently >25 cm (10")    From Standing Position, Pick up Object from Floor Able to pick up shoe safely and easily    From Standing Position, Turn to Look Behind Over each Shoulder Looks behind from both sides and weight shifts well    Turn 360 Degrees Able to turn 360 degrees safely in 4 seconds or less    Standing Unsupported, Alternately Place Feet on Step/Stool Able to stand independently and safely and complete 8 steps in 20 seconds    Standing Unsupported, One Foot in Front Able to plae foot ahead of the other independently and hold 30 seconds    Standing on One Leg Tries to lift leg/unable to hold 3 seconds but remains standing independently    Total Score 52                           [facility] Adult PT Treatment/Exercise - 01/24/22 0001       Knee/Hip Exercises: Stretches   Passive Hamstring Stretch Right;30 seconds;Left;2 reps    Medical laboratory scientific officer Limitations standing holding on rail    Hip Flexor Stretch Right;30 seconds;2 reps    Hip Flexor Stretch Limitations on step      Knee/Hip Exercises: Aerobic   Tread Mill 2.5 mph x 2 min grade 3, x1 min grade 4, x3 min grade 1      Knee/Hip Exercises: Standing   Other Standing Knee Exercises deadlifts 10# KB 2x10      Knee/Hip Exercises: Seated   Sit to Sand 10 reps;without UE support   staggered stance                       PT Short Term Goals - 01/18/22 1137       PT SHORT TERM GOAL #1   Title independent with initial HEP    Time 4    Period Weeks    Status Achieved    Target Date 01/21/22      PT SHORT TERM GOAL #2   Title Pt will have improved 5x STS to </=13 sec to demo improved functional LE  strength    Baseline 11 sec    Time 4    Period Weeks    Status Achieved    Target Date 01/21/22      PT SHORT TERM GOAL #3   Title Pt will improve TUG score to </=13 sec with LRAD to demo decreased fall risk    Baseline 8 sec    Time 4    Period Weeks    Status Achieved    Target Date 01/21/22      PT SHORT TERM GOAL #4   Title PT will assess Berg Balance Score to obtain baseline score    Baseline 45/56 on 12/26/21    Time 2    Period Weeks    Status Achieved    Target Date 01/07/22               PT Long Term Goals - 01/24/22 1025       PT LONG TERM GOAL #1   Title independent with advanced HEP    Time 8    Period Weeks    Status On-going    Target Date 02/18/22      PT LONG TERM GOAL #2   Title Pt will have improved FOTO score to 70    Time 8    Period Weeks    Status On-going    Target Date 02/18/22      PT LONG TERM GOAL #3   Title Pt will be able to amb at least 1000' in the community with LRAD    Time 8    Period Weeks    Status On-going    Target Date 02/18/22      PT LONG TERM GOAL #4   Title Pt will demo at least 8 point increase in Berg Balance Score for MDIC    Baseline TBA    Time 8    Period Weeks    Status Achieved    Target Date 02/18/22      PT LONG TERM GOAL #5   Title Pt will demo at least 4/5 strength in R LE    Time 8    Period Weeks    Status On-going    Target Date 02/18/22      PT LONG TERM GOAL #6   Title Pt will be (-) with all canalith testing positions to demo resolution of BPPV    Status Achieved    Target Date 02/18/22                   Plan - 01/24/22 1019     Clinical Impression Statement  Dix-Hallpike was (-) this session; however, pt reports mild shift upon supine to sit. Rechecked pt's Berg Balance which has improved to 52/56. He is no longer considered a high fall risk. Progressed pt's exercises in HEP. Worked on aerobics and amb endurance on treadmill. Worked on increasing grade to prep him for community/outdoor amb. [facility]our[facility] pt that [facility]. He continues to make good gains towards his goals and would benefit from continued PT.    Personal Factors and Comorbidities Age;Fitness;Time since onset of injury/illness/exacerbation    Examination-Activity Limitations Locomotion Level;Transfers;Squat;Stairs;Stand;Lift;Toileting;Carry;Bed Mobility;Bathing    Examination-Participation Restrictions Meal Prep;Cleaning;Community Activity;Yard Work;Shop;Driving    Stability/Clinical Decision Making Stable/Uncomplicated    Rehab Potential Good    PT Frequency 2x / week    PT Duration 8 weeks    PT Treatment/Interventions ADLs/Self Care Home Management;Aquatic Therapy;Cryotherapy;Lobbyist  Stimulation;Iontophoresis 4mg /ml Dexamethasone;Moist Heat;DME Instruction;Ultrasound;Gait training;Stair training;Functional mobility training;Therapeutic activities;Therapeutic exercise;Balance training;Neuromuscular re-education;Manual techniques;Patient/family education;Dry needling;Passive range of motion;Taping;Vasopneumatic Device;Vestibular    PT Next Visit Plan Assess for L posterior canalith BPPV and treat accordingly. Work on hip and quad strengthening. Continue to progress pt's resistance as tolerated.    PT Home Exercise Plan Access Code: [email or web address]    Consulted and Agree with Plan of Care Patient;Family member/caregiver    Family Member Consulted Wife, Harriett Sine             Patient will benefit from skilled therapeutic intervention in order to improve the following deficits and impairments:  Abnormal gait, Decreased range of motion, Difficulty walking, Decreased  endurance, Pain, Decreased balance, Decreased mobility, Decreased strength, Increased edema, Hypomobility, Impaired flexibility  Visit Diagnosis: Difficulty in walking, not elsewhere classified  Muscle weakness (generalized)  Other abnormalities of gait and mobility  Stiffness of right hip, not elsewhere classified  Pain in right hip     Problem List Patient Active Problem List   Diagnosis Date Noted   PAF (paroxysmal atrial fibrillation) (HCC) 12/13/2021   Primary osteoarthritis of right hip 10/26/2021   Abnormal liver function tests 09/10/2021   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   Urinary hesitancy 03/08/2021   Thoracic aortic aneurysm without rupture 03/08/2021   Kidney stone    Senile purpura (HCC) 09/07/2020   Aortic atherosclerosis (HCC) 09/07/2020   Sinus bradycardia 03/31/2020   Essential hypertension 03/31/2020   Major depressive disorder 03/07/2020   Essential tremor 03/07/2020   Tremor 01/21/2020   Obesity (BMI 30-39.9) 12/10/2019   Chest pain of uncertain etiology 12/10/2019   Secondary hypercoagulable state (HCC) 09/28/2019   Centrilobular emphysema (HCC) 09/23/2019   Former smoker 09/23/2019   Abnormal findings on diagnostic imaging of lung 09/23/2019   Atrial fibrillation (HCC) 09/23/2019   At risk for sleep apnea 09/23/2019   Shortness of breath 09/23/2019   Nontraumatic incomplete tear of right rotator cuff 10/14/2018   History of adenomatous polyp of colon 03/03/2018   RBBB 06/09/2012   Nephrolithiasis 05/22/2011   Mixed hyperlipidemia 11/20/2009   DDD (degenerative disc disease), cervical 05/13/2008    [facility] April Dell Ponto, PT, DPT 01/24/2022, 10:27 AM  [facility] [address] [place], [place], [address] Phone: [phone]   Fax:  [phone]  Name: Joshua [name] [facility] III MRN: [id number] Date of Birth: Oct 26, 1946

## 2022-01-28 ENCOUNTER — Other Ambulatory Visit: Payer: Self-pay

## 2022-01-28 ENCOUNTER — Ambulatory Visit: Payer: Medicare HMO | Admitting: Physical Therapy

## 2022-01-28 DIAGNOSIS — R2689 Other abnormalities of gait and mobility: Secondary | ICD-10-CM

## 2022-01-28 DIAGNOSIS — M25551 Pain in right hip: Secondary | ICD-10-CM | POA: Diagnosis not present

## 2022-01-28 DIAGNOSIS — M25651 Stiffness of right hip, not elsewhere classified: Secondary | ICD-10-CM

## 2022-01-28 DIAGNOSIS — M6281 Muscle weakness (generalized): Secondary | ICD-10-CM | POA: Diagnosis not present

## 2022-01-28 DIAGNOSIS — R262 Difficulty in walking, not elsewhere classified: Secondary | ICD-10-CM

## 2022-01-28 NOTE — Therapy (Signed)
[place] [facility] ?[place], [place], [address] ?Phone: [phone]   Fax:  [phone] ? ?Physical Therapy Treatment ? ?Patient Details  ?Name: Joshua [name] ?MRN: [id number] ?Date of Birth: February 07, 1946 ?Referring Provider (PT): Dorna Leitz ? ? ?Encounter Date: 01/28/2022 ? ? PT End of Session - 01/28/22 0931   ? ? Visit Number 11   ? Number of Visits 16   ? Date for PT Re-Evaluation 02/18/22   ? Authorization Type [employer]   ? Progress Note Due on Visit 10   ? PT Start Time 0930   ? PT Stop Time 1010   ? PT Time Calculation (min) 40 min   ? Equipment Utilized During Treatment Gait belt   ? Activity Tolerance Patient tolerated treatment well   ? Behavior During Therapy [facility] for tasks assessed/performed   ? ?  ?  ? ?  ? ? ?Past Medical History:  ?Diagnosis Date  ? Abnormal findings on diagnostic imaging of lung 09/23/2019  ? 09/22/2019-lung cancer screening-centrilobular and paraseptal emphysema, calcified granulomata noted bilaterally, no suspicious nodules, lung RADS 1, repeat in 12 months   ? Anxiety   ? Atrial fibrillation ([place]) 09/23/2019  ? Centrilobular emphysema ([place]) 09/23/2019  ? 09/22/2019-lung cancer screening-centrilobular and paraseptal emphysema, calcified granulomata noted bilaterally, no suspicious nodules, lung RADS 1, repeat in 12 months  09/23/2019-FVC 3.74 (81% predicted), postbronchodilator ratio 80, postbronchodilator FEV1 2.88 (86% predicted), no bronchodilator response, DLCO 19.44 (73% predicted)  ? DDD (degenerative disc disease), cervical 05/13/2008  ? Cervical Disc Degeneration  ? Depression   ? History of adenomatous polyp of colon 03/03/2018  ? 05/14/17: Colonoscopy: nonadvanced adenoma, f/u 5 yrs, use SuPrep, Murphy/GAP  Last Assessment & Plan:  Relevant Hx: Course: Daily Update: Today's Plan:  ? Hyperlipidemia   ? Kidney stone   ? Nephrolithiasis 05/22/2011  ? Nontraumatic incomplete tear of right rotator cuff  10/14/2018  ? Other and unspecified hyperlipidemia 11/20/2009  ? Hyperlipidemia  ? RBBB 06/09/2012  ? Secondary hypercoagulable state ([place]) 09/28/2019  ? Shortness of breath 09/23/2019  ? ? ?Past Surgical History:  ?Procedure Laterality Date  ? CARDIOVERSION N/A 10/15/2019  ? Procedure: CARDIOVERSION;  Surgeon: Jerline Pain, MD;  Location: [facility] ENDOSCOPY;  Service: Cardiovascular;  Laterality: N/A;  ? CARDIOVERSION N/A 11/29/2019  ? Procedure: CARDIOVERSION;  Surgeon: Dorothy Spark, MD;  Location: [place];  Service: Cardiovascular;  Laterality: N/A;  ? HEMORRHOID SURGERY    ? 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 Procedure: TOTAL HIP ARTHROPLASTY ANTERIOR APPROACH;  Surgeon: Dorna Leitz, MD;  Location: [facility];  Service: Orthopedics;  Laterality: Right;  ? VARICOSE VEIN SURGERY Right   ? ? ?There were no vitals filed for this visit. ? ? Subjective Assessment - 01/28/22 0931   ? ? Subjective Pt reports scab has completely come off his hip.   ? Pertinent History RTC surgery 4 years ago   ? How long can you sit comfortably? n/a   ? How long can you stand comfortably? <5 minutes   ? How long can you walk comfortably? Able to get around house   ? Patient Stated Goals Cutting grass/yard work, cooking, car maintenance   ? Currently in Pain? Yes   ? Pain Score 3    0-3 depending on movement  ? Pain Location Hip   ? Pain Orientation Right   ? Pain Descriptors / Indicators Sore   ? Pain Type Surgical pain   ?  Pain Onset 1 to 4 weeks ago   ? ?  ?  ? ?  ? ? ? ? ? [facility] PT Assessment - 01/28/22 0001   ? ?  ? Assessment  ? Medical Diagnosis THA   ? Referring Provider (PT) Dorna Leitz   ? Onset Date/Surgical Date 12/21/21   ? Hand Dominance Right   ? ?  ?  ? ?  ? ? ? ? ? ? ? ? ? ? ? ? ? ? ? ? [place] Adult PT Treatment/Exercise - 01/28/22 0001   ? ?  ? Knee/Hip Exercises: Machines for Strengthening  ? Cybex Knee Extension 4 plates 3x10 SL   ? Total Gym Leg Press DL 5 plates x10; SL 5 plates x10 on R, SL 6 plates 2x10 on  R   ?  ? Knee/Hip Exercises: Standing  ? Lateral Step Up Right;Left;10 reps;Step Height: 6"   ? Lateral Step Up Limitations runner's step up   ? Forward Step Up Right;Left;10 reps;Step Height: 6"   ? Forward Step Up Limitations runner's step up   ? Step Down Right;Left;10 reps;Step Height: 6"   ? Step Down Limitations eccentrics   ?  ? Manual Therapy  ? Manual Therapy Taping   ? Kinesiotex Edema   ?  ? Kinesiotix  ? Edema 2 squid shaped tape from inner thigh to hip flexor   ? ?  ?  ? ?  ? ? ? ? ? ? ? ? ? ? ? ? PT Short Term Goals - 01/18/22 1137   ? ?  ? PT SHORT TERM GOAL #1  ? Title independent with initial HEP   ? Time 4   ? Period Weeks   ? Status Achieved   ? Target Date 01/21/22   ?  ? PT SHORT TERM GOAL #2  ? Title Pt will have improved 5x STS to </=13 sec to demo improved functional LE strength   ? Baseline 11 sec   ? Time 4   ? Period Weeks   ? Status Achieved   ? Target Date 01/21/22   ?  ? PT SHORT TERM GOAL #3  ? Title Pt will improve TUG score to </=13 sec with LRAD to demo decreased fall risk   ? Baseline 8 sec   ? Time 4   ? Period Weeks   ? Status Achieved   ? Target Date 01/21/22   ?  ? PT SHORT TERM GOAL #4  ? Title PT will assess Berg Balance Score to obtain baseline score   ? Baseline 45/56 on 12/26/21   ? Time 2   ? Period Weeks   ? Status Achieved   ? Target Date 01/07/22   ? ?  ?  ? ?  ? ? ? ? PT Long Term Goals - 01/24/22 1025   ? ?  ? PT LONG TERM GOAL #1  ? Title independent with advanced HEP   ? Time 8   ? Period Weeks   ? Status On-going   ? Target Date 02/18/22   ?  ? PT LONG TERM GOAL #2  ? Title Pt will have improved FOTO score to 70   ? Time 8   ? Period Weeks   ? Status On-going   ? Target Date 02/18/22   ?  ? PT LONG TERM GOAL #3  ? Title Pt will be able to amb at least 1000' in the community with [place]   ? Time 8   ?  Period Weeks   ? Status On-going   ? Target Date 02/18/22   ?  ? PT LONG TERM GOAL #4  ? Title Pt will demo at least 8 point increase in [place] Score for MDIC   ?  Baseline TBA   ? Time 8   ? Period Weeks   ? Status Achieved   ? Target Date 02/18/22   ?  ? PT LONG TERM GOAL #5  ? Title Pt will demo at least 4/5 strength in R LE   ? Time 8   ? Period Weeks   ? Status On-going   ? Target Date 02/18/22   ?  ? PT LONG TERM GOAL #6  ? Title Pt will be (-) with all canalith testing positions to demo resolution of BPPV   ? Status Achieved   ? Target Date 02/18/22   ? ?  ?  ? ?  ? ? ? ? ? ? ? ? Plan - 01/28/22 0957   ? ? Clinical Impression Statement Trialed k-tape this session to help improve pt's R hip flexor edema. Continued to work on strength and stability. Pt able to tolerate increased weight. He is to see ortho 01/31/22   ? Personal Factors and Comorbidities Age;Fitness;Time since onset of injury/illness/exacerbation   ? Examination-Activity Limitations Locomotion Level;Transfers;Squat;Stairs;Stand;Lift;Toileting;Carry;Bed Mobility;Bathing   ? Examination-Participation Restrictions Meal Prep;Cleaning;Community Activity;Yard Work;Shop;Driving   ? Stability/Clinical Decision Making Stable/Uncomplicated   ? Rehab Potential Good   ? PT Frequency 2x / week   ? PT Duration 8 weeks   ? PT Treatment/Interventions ADLs/Self Care Home Management;Aquatic Therapy;Cryotherapy;Electrical Stimulation;Iontophoresis '4mg'$ /ml Dexamethasone;Moist Heat;DME Instruction;Ultrasound;Gait training;Stair training;Functional mobility training;Therapeutic activities;Therapeutic exercise;Balance training;Neuromuscular re-education;Manual techniques;Patient/family education;Dry needling;Passive range of motion;Taping;Vasopneumatic Device;Vestibular   ? PT Next Visit Plan Assess for L posterior canalith BPPV and treat accordingly. Work on hip and quad strengthening. Continue to progress pt's resistance as tolerated.   ? PT Home Exercise Plan Access Code: [email or web address]   ? Consulted and Agree with Plan of Care Patient;Family member/caregiver   ? Family Member Consulted Wife, Izora Gala   ? ?  ?  ? ?  ? ? ?Patient will  benefit from skilled therapeutic intervention in order to improve the following deficits and impairments:  Abnormal gait, Decreased range of motion, Difficulty walking, Decreased endurance, Pain, Decrea

## 2022-01-31 ENCOUNTER — Encounter: Payer: Medicare HMO | Admitting: Physical Therapy

## 2022-01-31 DIAGNOSIS — M1611 Unilateral primary osteoarthritis, right hip: Secondary | ICD-10-CM | POA: Diagnosis not present

## 2022-01-31 DIAGNOSIS — Z9889 Other specified postprocedural states: Secondary | ICD-10-CM | POA: Diagnosis not present

## 2022-02-01 ENCOUNTER — Ambulatory Visit: Payer: Medicare HMO | Admitting: Physical Therapy

## 2022-02-01 DIAGNOSIS — R262 Difficulty in walking, not elsewhere classified: Secondary | ICD-10-CM | POA: Diagnosis not present

## 2022-02-01 DIAGNOSIS — R2689 Other abnormalities of gait and mobility: Secondary | ICD-10-CM | POA: Diagnosis not present

## 2022-02-01 DIAGNOSIS — M6281 Muscle weakness (generalized): Secondary | ICD-10-CM

## 2022-02-01 DIAGNOSIS — M25551 Pain in right hip: Secondary | ICD-10-CM | POA: Diagnosis not present

## 2022-02-01 DIAGNOSIS — M25651 Stiffness of right hip, not elsewhere classified: Secondary | ICD-10-CM

## 2022-02-01 NOTE — Therapy (Signed)
[place] ?[facility] ?[address] ?[place], [place], [address] ?Phone: [phone]   Fax:  [phone] ? ?Physical Therapy Treatment ? ?Patient Details  ?Name: Joshua [name] ?MRN: [id number] ?Date of Birth: 1946/02/15 ?Referring Provider (PT): Dorna Leitz ? ? ?Encounter Date: 02/01/2022 ? ? PT End of Session - 02/01/22 1324   ? ? Visit Number 12   ? Number of Visits 16   ? Date for PT Re-Evaluation 02/18/22   ? Authorization Type [employer]   ? Progress Note Due on Visit 10   ? PT Start Time [phone]   ? PT Stop Time 1015   ? PT Time Calculation (min) 38 min   ? Activity Tolerance Patient tolerated treatment well   ? Behavior During Therapy [facility] for tasks assessed/performed   ? ?  ?  ? ?  ? ? ?Past Medical History:  ?Diagnosis Date  ? Abnormal findings on diagnostic imaging of lung 09/23/2019  ? 09/22/2019-lung cancer screening-centrilobular and paraseptal emphysema, calcified granulomata noted bilaterally, no suspicious nodules, lung RADS 1, repeat in 12 months   ? Anxiety   ? Atrial fibrillation ([place]) 09/23/2019  ? Centrilobular emphysema ([place]) 09/23/2019  ? 09/22/2019-lung cancer screening-centrilobular and paraseptal emphysema, calcified granulomata noted bilaterally, no suspicious nodules, lung RADS 1, repeat in 12 months  09/23/2019-FVC 3.74 (81% predicted), postbronchodilator ratio 80, postbronchodilator FEV1 2.88 (86% predicted), no bronchodilator response, DLCO 19.44 (73% predicted)  ? DDD (degenerative disc disease), cervical 05/13/2008  ? Cervical Disc Degeneration  ? Depression   ? History of adenomatous polyp of colon 03/03/2018  ? 05/14/17: Colonoscopy: nonadvanced adenoma, f/u 5 yrs, use SuPrep, Murphy/GAP  Last Assessment & Plan:  Relevant Hx: Course: Daily Update: Today's Plan:  ? Hyperlipidemia   ? Kidney stone   ? Nephrolithiasis 05/22/2011  ? Nontraumatic incomplete tear of right rotator cuff 10/14/2018  ? Other and unspecified hyperlipidemia  11/20/2009  ? Hyperlipidemia  ? RBBB 06/09/2012  ? Secondary hypercoagulable state ([place]) 09/28/2019  ? Shortness of breath 09/23/2019  ? ? ?Past Surgical History:  ?Procedure Laterality Date  ? CARDIOVERSION N/A 10/15/2019  ? Procedure: CARDIOVERSION;  Surgeon: Jerline Pain, MD;  Location: [facility] ENDOSCOPY;  Service: Cardiovascular;  Laterality: N/A;  ? CARDIOVERSION N/A 11/29/2019  ? Procedure: CARDIOVERSION;  Surgeon: Dorothy Spark, MD;  Location: [place];  Service: Cardiovascular;  Laterality: N/A;  ? HEMORRHOID SURGERY    ? 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 Procedure: TOTAL HIP ARTHROPLASTY ANTERIOR APPROACH;  Surgeon: Dorna Leitz, MD;  Location: [facility];  Service: Orthopedics;  Laterality: Right;  ? VARICOSE VEIN SURGERY Right   ? ? ?There were no vitals filed for this visit. ? ? Subjective Assessment - 02/01/22 0939   ? ? Subjective Pt states that he feels that the taping did help some with his swelling. Pt has been cleared to drive by ortho. Ortho feels that edema is normal.   ? Pertinent History RTC surgery 4 years ago   ? How long can you sit comfortably? n/a   ? How long can you stand comfortably? <5 minutes   ? How long can you walk comfortably? Able to get around house   ? Patient Stated Goals Cutting grass/yard work, cooking, car maintenance   ? Currently in Pain? Yes   ? Pain Score 3    ? Pain Location Hip   ? Pain Onset 1 to 4 weeks ago   ? ?  ?  ? ?  ? ? ? ? ?  [facility] PT Assessment - 02/01/22 0001   ? ?  ? Assessment  ? Medical Diagnosis THA   ? Referring Provider (PT) Dorna Leitz   ? Onset Date/Surgical Date 12/21/21   ? Hand Dominance Right   ? Next MD Visit In 2 more months   ? ?  ?  ? ?  ? ? ? ? ? ? ? ? ? ? ? ? ? ? ? ? [place] [facility] PT Treatment/Exercise - 02/01/22 0001   ? ?  ? Knee/Hip Exercises: Aerobic  ? [place] 1.7 to 2.5 mph x 8 min   ?  ? Knee/Hip Exercises: Machines for Strengthening  ? Cybex Knee Extension 4 plates 3x10 eccentrics on L   ?  ? Knee/Hip Exercises: Standing   ? Knee Flexion Right;10 reps;3 sets   ? Knee Flexion Limitations green tband   ? Lateral Step Up Right;Left;10 reps;Step Height: 6"   ? Lateral Step Up Limitations runner's step up   ? Forward Step Up Right;Left;10 reps;Step Height: 6"   ? Forward Step Up Limitations runner's step up   ? Step Down Right;Left;10 reps;Step Height: 6"   ? Step Down Limitations eccentrics   ?  ? Knee/Hip Exercises: Seated  ? Sit to Sand 2 sets;without UE support;10 reps   staggered sit to stand; eccentric SL return to sitting  ? ?  ?  ? ?  ? ? ? ? ? ? ? ? ? ? ? ? PT Short Term Goals - 01/18/22 1137   ? ?  ? PT SHORT TERM GOAL #1  ? Title independent with initial HEP   ? Time 4   ? Period Weeks   ? Status Achieved   ? Target Date 01/21/22   ?  ? PT SHORT TERM GOAL #2  ? Title Pt will have improved 5x STS to </=13 sec to demo improved functional LE strength   ? Baseline 11 sec   ? Time 4   ? Period Weeks   ? Status Achieved   ? Target Date 01/21/22   ?  ? PT SHORT TERM GOAL #3  ? Title Pt will improve TUG score to </=13 sec with LRAD to demo decreased fall risk   ? Baseline 8 sec   ? Time 4   ? Period Weeks   ? Status Achieved   ? Target Date 01/21/22   ?  ? PT SHORT TERM GOAL #4  ? Title PT will assess Berg Balance Score to obtain baseline score   ? Baseline 45/56 on 12/26/21   ? Time 2   ? Period Weeks   ? Status Achieved   ? Target Date 01/07/22   ? ?  ?  ? ?  ? ? ? ? PT Long Term Goals - 01/24/22 1025   ? ?  ? PT LONG TERM GOAL #1  ? Title independent with advanced HEP   ? Time 8   ? Period Weeks   ? Status On-going   ? Target Date 02/18/22   ?  ? PT LONG TERM GOAL #2  ? Title Pt will have improved FOTO score to 70   ? Time 8   ? Period Weeks   ? Status On-going   ? Target Date 02/18/22   ?  ? PT LONG TERM GOAL #3  ? Title Pt will be able to amb at least 1000' in the community with [place]   ? Time 8   ? Period Weeks   ?  Status On-going   ? Target Date 02/18/22   ?  ? PT LONG TERM GOAL #4  ? Title Pt will demo at least 8 point increase  in [place] Score for MDIC   ? Baseline TBA   ? Time 8   ? Period Weeks   ? Status Achieved   ? Target Date 02/18/22   ?  ? PT LONG TERM GOAL #5  ? Title Pt will demo at least 4/5 strength in R LE   ? Time 8   ? Period Weeks   ? Status On-going   ? Target Date 02/18/22   ?  ? PT LONG TERM GOAL #6  ? Title Pt will be (-) with all canalith testing positions to demo resolution of BPPV   ? Status Achieved   ? Target Date 02/18/22   ? ?  ?  ? ?  ? ? ? ? ? ? ? ? ? ?Patient will benefit from skilled therapeutic intervention in order to improve the following deficits and impairments:    ? ?Visit Diagnosis: ?Difficulty in walking, not elsewhere classified ? ?Muscle weakness (generalized) ? ?Other abnormalities of gait and mobility ? ?Stiffness of right hip, not elsewhere classified ? ?Pain in right hip ? ? ? ? ?Problem List ?Patient Active Problem List  ? Diagnosis Date Noted  ? PAF (paroxysmal atrial fibrillation) ([place]) 12/13/2021  ? Primary osteoarthritis of right hip 10/26/2021  ? Abnormal liver function tests 09/10/2021  ? 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  ? Urinary hesitancy 03/08/2021  ? Thoracic aortic aneurysm without rupture 03/08/2021  ? Kidney stone   ? Senile purpura ([place]) 09/07/2020  ? Aortic atherosclerosis ([place]) 09/07/2020  ? Sinus bradycardia 03/31/2020  ? Essential hypertension 03/31/2020  ? Major depressive disorder 03/07/2020  ? Essential tremor 03/07/2020  ? Tremor 01/21/2020  ? Obesity (BMI 30-39.9) 12/10/2019  ? Chest pain of uncertain etiology 48/47/2072  ? Secondary hypercoagulable state ([place]) 09/28/2019  ? Centrilobular emphysema ([place]) 09/23/2019  ? Former smoker 09/23/2019  ? Abnormal findings on diagnostic imaging of lung 09/23/2019  ? Atrial fibrillation ([place]) 09/23/2019  ? At risk for sleep apnea 09/23/2019  ? Shortness of breath 09/23/2019  ? Nontraumatic incomplete tear of right rotator cuff 10/14/2018  ? History of adenomatous polyp of colon 03/03/2018  ? RBBB 06/09/2012  ? Nephrolithiasis 05/22/2011   ? Mixed hyperlipidemia 11/20/2009  ? DDD (degenerative disc disease), cervical 05/13/2008  ? ? ?Shifra Swartzentruber April Gordy Levan, PT, DPT ?02/01/2022, 10:09 AM ? ?[place] ?[facility]

## 2022-02-04 DIAGNOSIS — N2889 Other specified disorders of kidney and ureter: Secondary | ICD-10-CM | POA: Diagnosis not present

## 2022-02-04 DIAGNOSIS — N200 Calculus of kidney: Secondary | ICD-10-CM | POA: Diagnosis not present

## 2022-02-04 DIAGNOSIS — N401 Enlarged prostate with lower urinary tract symptoms: Secondary | ICD-10-CM | POA: Diagnosis not present

## 2022-02-04 DIAGNOSIS — N138 Other obstructive and reflux uropathy: Secondary | ICD-10-CM | POA: Diagnosis not present

## 2022-02-05 ENCOUNTER — Ambulatory Visit: Payer: Medicare HMO | Attending: Orthopedic Surgery | Admitting: Physical Therapy

## 2022-02-05 DIAGNOSIS — M6281 Muscle weakness (generalized): Secondary | ICD-10-CM | POA: Insufficient documentation

## 2022-02-05 DIAGNOSIS — M25651 Stiffness of right hip, not elsewhere classified: Secondary | ICD-10-CM | POA: Diagnosis not present

## 2022-02-05 DIAGNOSIS — M25551 Pain in right hip: Secondary | ICD-10-CM | POA: Diagnosis not present

## 2022-02-05 DIAGNOSIS — R2689 Other abnormalities of gait and mobility: Secondary | ICD-10-CM | POA: Diagnosis not present

## 2022-02-05 DIAGNOSIS — R262 Difficulty in walking, not elsewhere classified: Secondary | ICD-10-CM | POA: Diagnosis not present

## 2022-02-05 NOTE — Therapy (Signed)
[place] ?[facility] ?[address] ?[place], [place], [address] ?Phone: [phone]   Fax:  [phone] ? ?Physical Therapy Treatment ? ?Patient Details  ?Name: Koty Anctil Ogando III ?MRN: [id number] ?Date of Birth: 1946/03/06 ?Referring Provider (PT): Dorna Leitz ? ? ?Encounter Date: 02/05/2022 ? ? PT End of Session - 02/05/22 1019   ? ? Visit Number 13   ? Number of Visits 16   ? Date for PT Re-Evaluation 02/18/22   ? Authorization Type [employer] Medicare   ? Progress Note Due on Visit 10   ? PT Start Time 1020   ? PT Stop Time 1100   ? PT Time Calculation (min) 40 min   ? Activity Tolerance Patient tolerated treatment well   ? Behavior During Therapy [facility] for tasks assessed/performed   ? ?  ?  ? ?  ? ? ?Past Medical History:  ?Diagnosis Date  ? Abnormal findings on diagnostic imaging of lung 09/23/2019  ? 09/22/2019-lung cancer screening-centrilobular and paraseptal emphysema, calcified granulomata noted bilaterally, no suspicious nodules, lung RADS 1, repeat in 12 months   ? Anxiety   ? Atrial fibrillation ([place]) 09/23/2019  ? Centrilobular emphysema ([place]) 09/23/2019  ? 09/22/2019-lung cancer screening-centrilobular and paraseptal emphysema, calcified granulomata noted bilaterally, no suspicious nodules, lung RADS 1, repeat in 12 months  09/23/2019-FVC 3.74 (81% predicted), postbronchodilator ratio 80, postbronchodilator FEV1 2.88 (86% predicted), no bronchodilator response, DLCO 19.44 (73% predicted)  ? DDD (degenerative disc disease), cervical 05/13/2008  ? Cervical Disc Degeneration  ? Depression   ? History of adenomatous polyp of colon 03/03/2018  ? 05/14/17: Colonoscopy: nonadvanced adenoma, f/u 5 yrs, use SuPrep, [place]/GAP  Last Assessment & Plan:  Relevant Hx: Course: Daily Update: Today's Plan:  ? Hyperlipidemia   ? Kidney stone   ? Nephrolithiasis 05/22/2011  ? Nontraumatic incomplete tear of right rotator cuff 10/14/2018  ? Other and unspecified hyperlipidemia  11/20/2009  ? Hyperlipidemia  ? RBBB 06/09/2012  ? Secondary hypercoagulable state ([place]) 09/28/2019  ? Shortness of breath 09/23/2019  ? ? ?Past Surgical History:  ?Procedure Laterality Date  ? CARDIOVERSION N/A 10/15/2019  ? Procedure: CARDIOVERSION;  Surgeon: Jerline Pain, MD;  Location: [facility] END[facility]COPY;  Service: Cardiovascular;  Laterality: N/A;  ? CARDIOVERSION N/A 11/29/2019  ? Procedure: CARDIOVERSION;  Surgeon: Dorothy Spark, MD;  Location: [place];  Service: Cardiovascular;  Laterality: N/A;  ? HEMORRHOID SURGERY    ? 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 Procedure: TOTAL HIP ARTHROPLASTY ANTERIOR APPROACH;  Surgeon: Dorna Leitz, MD;  Location: [facility];  Service: Orthopedics;  Laterality: Right;  ? VARICOSE VEIN SURGERY Right   ? ? ?There were no vitals filed for this visit. ? ? Subjective Assessment - 02/05/22 1031   ? ? Subjective Pt notes some mild pain on the other hip. He states that he is averaging 2000 to 2500 steps.   ? Pertinent History RTC surgery 4 years ago   ? How long can you sit comfortably? n/a   ? How long can you stand comfortably? <5 minutes   ? How long can you walk comfortably? Able to get around house   ? Patient Stated Goals Cutting grass/yard work, cooking, car maintenance   ? Currently in Pain? Yes   ? Pain Score 3    ? Pain Location Hip   ? Pain Onset 1 to 4 weeks ago   ? ?  ?  ? ?  ? ? ? ? ? [facility] PT Assessment -  02/05/22 0001   ? ?  ? Assessment  ? Medical Diagnosis THA   ? Referring Provider (PT) Dorna Leitz   ? Onset Date/Surgical Date 12/21/21   ? Hand Dominance Right   ? Next MD Visit In 2 more months   ? ?  ?  ? ?  ? ? ? ? ? ? ? ? ? ? ? ? ? ? ? ? [place] Adult PT Treatment/Exercise - 02/05/22 0001   ? ?  ? Ambulation/Gait  ? Ambulation/Gait Yes   ? Ambulation/Gait Assistance 5: Supervision   ? Ambulation Distance (Feet) 1000 Feet   ? Assistive device None   ? Gait Pattern Step-through pattern   ? Ambulation Surface Level;Unlevel;Indoor;Outdoor;Grass   ? Gait  Comments Ascended/descended hills and unlevel grassy surfaces   ?  ? Knee/Hip Exercises: Machines for Strengthening  ? Cybex Knee Extension 6 plates 3x10 eccentrics on L   ? Total Gym Leg Press 6-7 plates 3x10 eccentrics on L   ?  ? Knee/Hip Exercises: Standing  ? Forward Lunges Right;Left;3 sets   next to counter  ? Side Lunges 10 reps   ? SLS with Vectors On foam: lateral tap on cone 2x10; backwards tap on floor 2x10   ? Other Standing Knee Exercises tandem stance on blue foam 2x30 sec   ? ?  ?  ? ?  ? ? ? ? ? ? ? ? ? ? ? ? PT Short Term Goals - 01/18/22 1137   ? ?  ? PT SHORT TERM GOAL #1  ? Title independent with initial HEP   ? Time 4   ? Period Weeks   ? Status Achieved   ? Target Date 01/21/22   ?  ? PT SHORT TERM GOAL #2  ? Title Pt will have improved 5x STS to </=13 sec to demo improved functional LE strength   ? Baseline 11 sec   ? Time 4   ? Period Weeks   ? Status Achieved   ? Target Date 01/21/22   ?  ? PT SHORT TERM GOAL #3  ? Title Pt will improve TUG score to </=13 sec with LRAD to demo decreased fall risk   ? Baseline 8 sec   ? Time 4   ? Period Weeks   ? Status Achieved   ? Target Date 01/21/22   ?  ? PT SHORT TERM GOAL #4  ? Title PT will assess Berg Balance Score to obtain baseline score   ? Baseline 45/56 on 12/26/21   ? Time 2   ? Period Weeks   ? Status Achieved   ? Target Date 01/07/22   ? ?  ?  ? ?  ? ? ? ? PT Long Term Goals - 01/24/22 1025   ? ?  ? PT LONG TERM GOAL #1  ? Title independent with advanced HEP   ? Time 8   ? Period Weeks   ? Status On-going   ? Target Date 02/18/22   ?  ? PT LONG TERM GOAL #2  ? Title Pt will have improved FOTO score to 70   ? Time 8   ? Period Weeks   ? Status On-going   ? Target Date 02/18/22   ?  ? PT LONG TERM GOAL #3  ? Title Pt will be able to amb at least 1000' in the community with [place]   ? Time 8   ? Period Weeks   ? Status On-going   ?  Target Date 02/18/22   ?  ? PT LONG TERM GOAL #4  ? Title Pt will demo at least 8 point increase in [place]  Score for MDIC   ? Baseline TBA   ? Time 8   ? Period Weeks   ? Status Achieved   ? Target Date 02/18/22   ?  ? PT LONG TERM GOAL #5  ? Title Pt will demo at least 4/5 strength in R LE   ? Time 8   ? Period Weeks   ? Status On-going   ? Target Date 02/18/22   ?  ? PT LONG TERM GOAL #6  ? Title Pt will be (-) with all canalith testing positions to demo resolution of BPPV   ? Status Achieved   ? Target Date 02/18/22   ? ?  ?  ? ?  ? ? ? ? ? ? ? ? Plan - 02/05/22 1101   ? ? Clinical Impression Statement Pt able to demo improved community mobility. Able to ascend/descend hill and grassy areas without any issue. Continued to work on strength and stability.   ? Personal Factors and Comorbidities Age;Fitness;Time since onset of injury/illness/exacerbation   ? Examination-Activity Limitations Locomotion Level;Transfers;Squat;Stairs;Stand;Lift;Toileting;Carry;Bed Mobility;Bathing   ? Examination-Participation Restrictions Meal Prep;Cleaning;Community Activity;Yard Work;Shop;Driving   ? Stability/Clinical Decision Making Stable/Uncomplicated   ? Rehab Potential Good   ? PT Frequency 2x / week   ? PT Duration 8 weeks   ? PT Treatment/Interventions ADLs/Self Care Home Management;Aquatic Therapy;Cryotherapy;Electrical Stimulation;Iontophoresis '4mg'$ /ml Dexamethasone;Moist Heat;DME Instruction;Ultrasound;Gait training;Stair training;Functional mobility training;Therapeutic activities;Therapeutic exercise;Balance training;Neuromuscular re-education;Manual techniques;Patient/family education;Dry needling;Passive range of motion;Taping;Vasopneumatic Device;Vestibular   ? PT Next Visit Plan Assess for L posterior canalith BPPV and treat accordingly. Work on hip and quad strengthening. Continue to progress pt's resistance as tolerated.   ? PT Home Exercise Plan Access Code: [email or web address]   ? Consulted and Agree with Plan of Care Patient;Family member/caregiver   ? Family Member Consulted Wife, Izora Gala   ? ?  ?  ? ?  ? ? ?Patient will benefit  from skilled therapeutic intervention in order to improve the following deficits and impairments:  Abnormal gait, Decreased range of motion, Difficulty walking, Decreased endurance, Pain, Decreased balance, Dec

## 2022-02-08 ENCOUNTER — Ambulatory Visit: Payer: Medicare HMO | Admitting: Physical Therapy

## 2022-02-08 DIAGNOSIS — R2689 Other abnormalities of gait and mobility: Secondary | ICD-10-CM

## 2022-02-08 DIAGNOSIS — M25551 Pain in right hip: Secondary | ICD-10-CM

## 2022-02-08 DIAGNOSIS — R262 Difficulty in walking, not elsewhere classified: Secondary | ICD-10-CM | POA: Diagnosis not present

## 2022-02-08 DIAGNOSIS — M25651 Stiffness of right hip, not elsewhere classified: Secondary | ICD-10-CM

## 2022-02-08 DIAGNOSIS — M6281 Muscle weakness (generalized): Secondary | ICD-10-CM

## 2022-02-08 NOTE — Therapy (Signed)
[facility] ?[name] ?[place], [place], [address] ?Phone: [phone]   Fax:  [phone] ? ?Physical Therapy Treatment ? ?Patient Details  ?Name: Rolland Steinert Sargent III ?MRN: [id number] ?Date of Birth: 1946/06/25 ?Referring Provider (PT): Dorna Leitz ? ? ?Encounter Date: 02/08/2022 ? ? PT End of Session - 02/08/22 1138   ? ? Visit Number 14   ? Number of Visits 16   ? Date for PT Re-Evaluation 02/18/22   ? Authorization Type [employer]   ? Progress Note Due on Visit 10   ? PT Start Time 1139   ? PT Stop Time 1225   ? PT Time Calculation (min) 46 min   ? Activity Tolerance Patient tolerated treatment well   ? Behavior During Therapy [facility] for tasks assessed/performed   ? ?  ?  ? ?  ? ? ?Past Medical History:  ?Diagnosis Date  ? Abnormal findings on diagnostic imaging of lung 09/23/2019  ? 09/22/2019-lung cancer screening-centrilobular and paraseptal emphysema, calcified granulomata noted bilaterally, no suspicious nodules, lung RADS 1, repeat in 12 months   ? Anxiety   ? Atrial fibrillation ([place]) 09/23/2019  ? Centrilobular emphysema ([place]) 09/23/2019  ? 09/22/2019-lung cancer screening-centrilobular and paraseptal emphysema, calcified granulomata noted bilaterally, no suspicious nodules, lung RADS 1, repeat in 12 months  09/23/2019-FVC 3.74 (81% predicted), postbronchodilator ratio 80, postbronchodilator FEV1 2.88 (86% predicted), no bronchodilator response, DLCO 19.44 (73% predicted)  ? DDD (degenerative disc disease), cervical 05/13/2008  ? Cervical Disc Degeneration  ? Depression   ? History of adenomatous polyp of colon 03/03/2018  ? 05/14/17: Colonoscopy: nonadvanced adenoma, f/u 5 yrs, use SuPrep, Murphy/GAP  Last Assessment & Plan:  Relevant Hx: Course: Daily Update: Today's Plan:  ? Hyperlipidemia   ? Kidney stone   ? Nephrolithiasis 05/22/2011  ? Nontraumatic incomplete tear of right rotator cuff 10/14/2018  ? Other and unspecified hyperlipidemia  11/20/2009  ? Hyperlipidemia  ? RBBB 06/09/2012  ? Secondary hypercoagulable state ([place]) 09/28/2019  ? Shortness of breath 09/23/2019  ? ? ?Past Surgical History:  ?Procedure Laterality Date  ? CARDIOVERSION N/A 10/15/2019  ? Procedure: CARDIOVERSION;  Surgeon: Jerline Pain, MD;  Location: [facility] ENDOSCOPY;  Service: Cardiovascular;  Laterality: N/A;  ? CARDIOVERSION N/A 11/29/2019  ? Procedure: CARDIOVERSION;  Surgeon: Dorothy Spark, MD;  Location: [facility];  Service: Cardiovascular;  Laterality: N/A;  ? HEMORRHOID SURGERY    ? 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 Procedure: TOTAL HIP ARTHROPLASTY ANTERIOR APPROACH;  Surgeon: Dorna Leitz, MD;  Location: [facility];  Service: Orthopedics;  Laterality: Right;  ? VARICOSE VEIN SURGERY Right   ? ? ?There were no vitals filed for this visit. ? ? Subjective Assessment - 02/08/22 1143   ? ? [facility] went [facility] exercise machines. Pt states soreness goes away with ice. Still not feeling sensation on lateral thigh.   ? Pertinent History RTC surgery 4 years ago   ? How long can you sit comfortably? n/a   ? How long can you stand comfortably? <5 minutes   ? How long can you walk comfortably? Able to get around house   ? Patient Stated Goals Cutting grass/yard work, cooking, car maintenance   ? Currently in Pain? Yes   ? Pain Score 1    ? Pain Location Hip   ? Pain Orientation Right   ? Pain Onset 1 to 4 weeks ago   ? ?  ?  ? ?  ? ? ? ? ? ? ? ? ? ? ? ? ? ? ? ? ? ? ? ?  [place] Adult PT Treatment/Exercise - 02/08/22 0001   ? ?  ? Knee/Hip Exercises: Aerobic  ? T[place] 2.2 mph x 8 min   ?  ? Knee/Hip Exercises: Standing  ? Forward Lunges Right;Left;10 reps   ? SLS 3x30 sec   ? SLS with Vectors On foam:forward tap on cone 2x10; lateral tap on cone 2x10; backwards tap on floor 2x10, backwards/across 2x10   ? Other Standing Knee Exercises captain morgan 2x10 sec hold   ? Other Standing Knee Exercises 2x10 step up on blue side  of bosu   ? ?  ?  ? ?  ? ? ? ? ? ? ? ? ? ? ? ? PT Short Term Goals - 01/18/22 1137   ? ?  ? PT SHORT TERM GOAL #1  ? Title independent with initial HEP   ? Time 4   ? Period Weeks   ? Status Achieved   ? Target Date 01/21/22   ?  ? PT SHORT TERM GOAL #2  ? Title Pt will have improved 5x STS to </=13 sec to demo improved functional LE strength   ? Baseline 11 sec   ? Time 4   ? Period Weeks   ? Status Achieved   ? Target Date 01/21/22   ?  ? PT SHORT TERM GOAL #3  ? Title Pt will improve TUG score to </=13 sec with LRAD to demo decreased fall risk   ? Baseline 8 sec   ? Time 4   ? Period Weeks   ? Status Achieved   ? Target Date 01/21/22   ?  ? PT SHORT TERM GOAL #4  ? Title PT will assess Berg Balance Score to obtain baseline score   ? Baseline 45/56 on 12/26/21   ? Time 2   ? Period Weeks   ? Status Achieved   ? Target Date 01/07/22   ? ?  ?  ? ?  ? ? ? ? PT Long Term Goals - 01/24/22 1025   ? ?  ? PT LONG TERM GOAL #1  ? Title independent with advanced HEP   ? Time 8   ? Period Weeks   ? Status On-going   ? Target Date 02/18/22   ?  ? PT LONG TERM GOAL #2  ? Title Pt will have improved FOTO score to 70   ? Time 8   ? Period Weeks   ? Status On-going   ? Target Date 02/18/22   ?  ? PT LONG TERM GOAL #3  ? Title Pt will be able to amb at least 1000' in the community with [place]   ? Time 8   ? Period Weeks   ? Status On-going   ? Target Date 02/18/22   ?  ? PT LONG TERM GOAL #4  ? Title Pt will demo at least 8 point increase in [place] Score for MDIC   ? Baseline TBA   ? Time 8   ? Period Weeks   ? Status Achieved   ? Target Date 02/18/22   ?  ? PT LONG TERM GOAL #5  ? Title Pt will demo at least 4/5 strength in R LE   ? Time 8   ? Period Weeks   ? Status On-going   ? Target Date 02/18/22   ?  ? PT LONG TERM GOAL #6  ? Title Pt will be (-) with all canalith testing positions to demo resolution of BPPV   ?  Status Achieved   ? Target Date 02/18/22   ? ?  ?  ? ?  ? ? ? ? ? ? ? ? Plan - 02/08/22 1220   ? ? Clinical  Impression Statement [facility] Has been able to perform machine strengthening. PT session thus focused on improving single leg stability and balance.   ? Personal Factors and Comorbidities Age;Fitness;Time since onset of injury/illness/exacerbation   ? Examination-Activity Limitations Locomotion Level;Transfers;Squat;Stairs;Stand;Lift;Toileting;Carry;Bed Mobility;Bathing   ? Examination-Participation Restrictions Meal Prep;Cleaning;Community Activity;Yard Work;Shop;Driving   ? Stability/Clinical Decision Making Stable/Uncomplicated   ? Rehab Potential Good   ? PT Frequency 2x / week   ? PT Duration 8 weeks   ? PT Treatment/Interventions ADLs/Self Care Home Management;Aquatic Therapy;Cryotherapy;Electrical Stimulation;Iontophoresis '4mg'$ /ml Dexamethasone;Moist Heat;DME Instruction;Ultrasound;Gait training;Stair training;Functional mobility training;Therapeutic activities;Therapeutic exercise;Balance training;Neuromuscular re-education;Manual techniques;Patient/family education;Dry needling;Passive range of motion;Taping;Vasopneumatic Device;Vestibular   ? PT Next Visit Plan Continue to work on LE stability and balance (single leg). Continue single leg strengthening.   ? PT Home Exercise Plan Access Code: [email or web address]   ? Consulted and Agree with Plan of Care Patient;Family member/caregiver   ? Family Member Consulted --   ? ?  ?  ? ?  ? ? ?Patient will benefit from skilled therapeutic intervention in order to improve the following deficits and impairments:  Abnormal gait, Decreased range of motion, Difficulty walking, Decreased endurance, Pain, Decreased balance, Decreased mobility, Decreased strength, Increased edema, Hypomobility, Impaired flexibility ? ?Visit Diagnosis: ?Difficulty in walking, not elsewhere classified ? ?Other abnormalities of gait and mobility ? ?Muscle weakness (generalized) ? ?Stiffness of right hip, not elsewhere classified ? ?Pain in right hip ? ? ? ? ?Problem List ?Patient Active  Problem List  ? Diagnosis Date Noted  ? PAF (paroxysmal atrial fibrillation) ([place]) 12/13/2021  ? Primary osteoarthritis of right hip 10/26/2021  ? Abnormal liver function tests 09/10/2021  ? Mild CAD 05/

## 2022-02-12 ENCOUNTER — Ambulatory Visit: Payer: Medicare HMO | Admitting: Physical Therapy

## 2022-02-12 DIAGNOSIS — R2689 Other abnormalities of gait and mobility: Secondary | ICD-10-CM | POA: Diagnosis not present

## 2022-02-12 DIAGNOSIS — M6281 Muscle weakness (generalized): Secondary | ICD-10-CM | POA: Diagnosis not present

## 2022-02-12 DIAGNOSIS — R262 Difficulty in walking, not elsewhere classified: Secondary | ICD-10-CM

## 2022-02-12 DIAGNOSIS — M25551 Pain in right hip: Secondary | ICD-10-CM | POA: Diagnosis not present

## 2022-02-12 DIAGNOSIS — M25651 Stiffness of right hip, not elsewhere classified: Secondary | ICD-10-CM

## 2022-02-12 NOTE — Therapy (Signed)
?[facility] ?[place] ?[place], [place], [address] ?Phone: [phone]   Fax:  [phone] ? ?Physical Therapy Treatment ? ?Patient Details  ?Name: Joshua [name] ?MRN: [id number] ?Date of Birth: 07-12-1946 ?Referring Provider (PT): Dorna Leitz ? ? ?Encounter Date: 02/12/2022 ? ? PT End of Session - 02/12/22 1102   ? ? Visit Number 15   ? Number of Visits 16   ? Date for PT Re-Evaluation 02/18/22   ? Authorization Type [employer] Medicare   ? Progress Note Due on Visit 10   ? PT Start Time 1102   ? PT Stop Time 1145   ? PT Time Calculation (min) 43 min   ? Activity Tolerance Patient tolerated treatment well   ? Behavior During Therapy [facility] for tasks assessed/performed   ? ?  ?  ? ?  ? ? ?Past Medical History:  ?Diagnosis Date  ? Abnormal findings on diagnostic imaging of lung 09/23/2019  ? 09/22/2019-lung cancer screening-centrilobular and paraseptal emphysema, calcified granulomata noted bilaterally, no suspicious nodules, lung RADS 1, repeat in 12 months   ? Anxiety   ? Atrial fibrillation ([place]) 09/23/2019  ? Centrilobular emphysema ([place]) 09/23/2019  ? 09/22/2019-lung cancer screening-centrilobular and paraseptal emphysema, calcified granulomata noted bilaterally, no suspicious nodules, lung RADS 1, repeat in 12 months  09/23/2019-FVC 3.74 (81% predicted), postbronchodilator ratio 80, postbronchodilator FEV1 2.88 (86% predicted), no bronchodilator response, DLCO 19.44 (73% predicted)  ? DDD (degenerative disc disease), cervical 05/13/2008  ? Cervical Disc Degeneration  ? Depression   ? History of adenomatous polyp of colon 03/03/2018  ? 05/14/17: Colonoscopy: nonadvanced adenoma, f/u 5 yrs, use SuPrep, [place]/GAP  Last Assessment & Plan:  Relevant Hx: Course: Daily Update: Today's Plan:  ? Hyperlipidemia   ? Kidney stone   ? Nephrolithiasis 05/22/2011  ? Nontraumatic incomplete tear of right rotator cuff 10/14/2018  ? Other and unspecified hyperlipidemia  11/20/2009  ? Hyperlipidemia  ? RBBB 06/09/2012  ? Secondary hypercoagulable state ([place]) 09/28/2019  ? Shortness of breath 09/23/2019  ? ? ?Past Surgical History:  ?Procedure Laterality Date  ? CARDIOVERSION N/A 10/15/2019  ? Procedure: CARDIOVERSION;  Surgeon: Jerline Pain, MD;  Location: [facility] ENDOSCOPY;  Service: Cardiovascular;  Laterality: N/A;  ? CARDIOVERSION N/A 11/29/2019  ? Procedure: CARDIOVERSION;  Surgeon: Dorothy Spark, MD;  Location: [place];  Service: Cardiovascular;  Laterality: N/A;  ? HEMORRHOID SURGERY    ? 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 Procedure: TOTAL HIP ARTHROPLASTY ANTERIOR APPROACH;  Surgeon: Dorna Leitz, MD;  Location: [facility];  Service: Orthopedics;  Laterality: Right;  ? VARICOSE VEIN SURGERY Right   ? ? ?There were no vitals filed for this visit. ? ? Subjective Assessment - 02/12/22 1113   ? ? Subjective Pt notes some continued difficulty with flexing his right hip. Otherwise no issues.   ? Pertinent History RTC surgery 4 years ago   ? How long can you sit comfortably? n/a   ? How long can you stand comfortably? <5 minutes   ? How long can you walk comfortably? Able to get around house   ? Patient Stated Goals Cutting grass/yard work, cooking, car maintenance   ? Currently in Pain? Yes   ? Pain Score 1    ? Pain Location Hip   ? Pain Onset 1 to 4 weeks ago   ? ?  ?  ? ?  ? ? ? ? ? [facility] PT Assessment - 02/12/22 0001   ? ?  ?  Assessment  ? Medical Diagnosis THA   ? Referring Provider (PT) Dorna Leitz   ? Onset Date/Surgical Date 12/21/21   ? Hand Dominance Right   ? Next MD Visit In 2 more months   ? ?  ?  ? ?  ? ? ? ? ? ? ? ? ? ? ? ? ? ? ? ? [place] Adult PT Treatment/Exercise - 02/12/22 0001   ? ?  ? Knee/Hip Exercises: Stretches  ? Piriformis Stretch Right;2 reps;30 seconds   ? Other Knee/Hip Stretches butterfly groin stretch supine 2x30 sec   ? Other Knee/Hip Stretches seated lumbar/hip flexion x30 sec   ?  ? Knee/Hip Exercises: Aerobic  ? Other Aerobic Amb around  building/outdoors x 8 min   ?  ? Knee/Hip Exercises: Seated  ? Other Seated Knee/Hip Exercises Eccentric marching from end range flexion 2x8   ?  ? Knee/Hip Exercises: Supine  ? Other Supine Knee/Hip Exercises Marching 2x10   ?  ? Manual Therapy  ? Manual therapy comments LAD R LE; grade [name] inferior hip mobilization and lateral hip mobilization   ? ?  ?  ? ?  ? ? ? ? ? ? ? ? ? ? ? ? PT Short Term Goals - 01/18/22 1137   ? ?  ? PT SHORT TERM GOAL #1  ? Title independent with initial HEP   ? Time 4   ? Period Weeks   ? Status Achieved   ? Target Date 01/21/22   ?  ? PT SHORT TERM GOAL #2  ? Title Pt will have improved 5x STS to </=13 sec to demo improved functional LE strength   ? Baseline 11 sec   ? Time 4   ? Period Weeks   ? Status Achieved   ? Target Date 01/21/22   ?  ? PT SHORT TERM GOAL #3  ? Title Pt will improve TUG score to </=13 sec with LRAD to demo decreased fall risk   ? Baseline 8 sec   ? Time 4   ? Period Weeks   ? Status Achieved   ? Target Date 01/21/22   ?  ? PT SHORT TERM GOAL #4  ? Title PT will assess Berg Balance Score to obtain baseline score   ? Baseline 45/56 on 12/26/21   ? Time 2   ? Period Weeks   ? Status Achieved   ? Target Date 01/07/22   ? ?  ?  ? ?  ? ? ? ? PT Long Term Goals - 01/24/22 1025   ? ?  ? PT LONG TERM GOAL #1  ? Title independent with advanced HEP   ? Time 8   ? Period Weeks   ? Status On-going   ? Target Date 02/18/22   ?  ? PT LONG TERM GOAL #2  ? Title Pt will have improved FOTO score to 70   ? Time 8   ? Period Weeks   ? Status On-going   ? Target Date 02/18/22   ?  ? PT LONG TERM GOAL #3  ? Title Pt will be able to amb at least 1000' in the community with [place]   ? Time 8   ? Period Weeks   ? Status On-going   ? Target Date 02/18/22   ?  ? PT LONG TERM GOAL #4  ? Title Pt will demo at least 8 point increase in [place] Score for MDIC   ? Baseline TBA   ?  Time 8   ? Period Weeks   ? Status Achieved   ? Target Date 02/18/22   ?  ? PT LONG TERM GOAL #5  ? Title Pt  will demo at least 4/5 strength in R LE   ? Time 8   ? Period Weeks   ? Status On-going   ? Target Date 02/18/22   ?  ? PT LONG TERM GOAL #6  ? Title Pt will be (-) with all canalith testing positions to demo resolution of BPPV   ? Status Achieved   ? Target Date 02/18/22   ? ?  ?  ? ?  ? ? ? ? ? ? ? ? Plan - 02/12/22 1142   ? ? Clinical Impression Statement Session focused on improving general hip mobility and pt's continued stiffness into hip flexion. Worked on hip flexor strengthening this session.   ? Personal Factors and Comorbidities Age;Fitness;Time since onset of injury/illness/exacerbation   ? Examination-Activity Limitations Locomotion Level;Transfers;Squat;Stairs;Stand;Lift;Toileting;Carry;Bed Mobility;Bathing   ? Examination-Participation Restrictions Meal Prep;Cleaning;Community Activity;Yard Work;Shop;Driving   ? Stability/Clinical Decision Making Stable/Uncomplicated   ? Rehab Potential Good   ? PT Frequency 2x / week   ? PT Duration 8 weeks   ? PT Treatment/Interventions ADLs/Self Care Home Management;Aquatic Therapy;Cryotherapy;Electrical Stimulation;Iontophoresis '4mg'$ /ml Dexamethasone;Moist Heat;DME Instruction;Ultrasound;Gait training;Stair training;Functional mobility training;Therapeutic activities;Therapeutic exercise;Balance training;Neuromuscular re-education;Manual techniques;Patient/family education;Dry needling;Passive range of motion;Taping;Vasopneumatic Device;Vestibular   ? PT Next Visit Plan Continue to work on LE stability and balance (single leg). Continue single leg strengthening.   ? PT Home Exercise Plan Access Code: [email or web address]   ? Consulted and Agree with Plan of Care Patient;Family member/caregiver   ? ?  ?  ? ?  ? ? ?Patient will benefit from skilled therapeutic intervention in order to improve the following deficits and impairments:  Abnormal gait, Decreased range of motion, Difficulty walking, Decreased endurance, Pain, Decreased balance, Decreased mobility, Decreased strength,  Increased edema, Hypomobility, Impaired flexibility ? ?Visit Diagnosis: ?Difficulty in walking, not elsewhere classified ? ?Other abnormalities of gait and mobility ? ?Muscle weakness (generalized) ? ?St

## 2022-02-15 ENCOUNTER — Ambulatory Visit: Payer: Medicare HMO | Admitting: Physical Therapy

## 2022-02-15 DIAGNOSIS — M25551 Pain in right hip: Secondary | ICD-10-CM | POA: Diagnosis not present

## 2022-02-15 DIAGNOSIS — R2689 Other abnormalities of gait and mobility: Secondary | ICD-10-CM | POA: Diagnosis not present

## 2022-02-15 DIAGNOSIS — M6281 Muscle weakness (generalized): Secondary | ICD-10-CM | POA: Diagnosis not present

## 2022-02-15 DIAGNOSIS — M25651 Stiffness of right hip, not elsewhere classified: Secondary | ICD-10-CM | POA: Diagnosis not present

## 2022-02-15 DIAGNOSIS — R262 Difficulty in walking, not elsewhere classified: Secondary | ICD-10-CM | POA: Diagnosis not present

## 2022-02-15 NOTE — Therapy (Signed)
[place] [facility] ?[place] ?[place], [place], [address] ?Phone: [phone]   Fax:  [phone] ? ?Physical Therapy Treatment ? ?Patient Details  ?Name: Joshua [name] ?MRN: [id number] ?Date of Birth: 28-Feb-1946 ?Referring Provider (PT): Dorna Leitz ? ? ?Encounter Date: 02/15/2022 ? ? PT End of Session - 02/15/22 1104   ? ? Visit Number 16   ? Number of Visits 16   ? Date for PT Re-Evaluation 02/18/22   ? Authorization Type [employer]   ? Progress Note Due on Visit 10   ? PT Start Time 1102   ? PT Stop Time 1145   ? PT Time Calculation (min) 43 min   ? Activity Tolerance Patient tolerated treatment well   ? Behavior During Therapy [facility] for tasks assessed/performed   ? ?  ?  ? ?  ? ? ?Past Medical History:  ?Diagnosis Date  ? Abnormal findings on diagnostic imaging of lung 09/23/2019  ? 09/22/2019-lung cancer screening-centrilobular and paraseptal emphysema, calcified granulomata noted bilaterally, no suspicious nodules, lung RADS 1, repeat in 12 months   ? Anxiety   ? Atrial fibrillation ([place]) 09/23/2019  ? Centrilobular emphysema ([place]) 09/23/2019  ? 09/22/2019-lung cancer screening-centrilobular and paraseptal emphysema, calcified granulomata noted bilaterally, no suspicious nodules, lung RADS 1, repeat in 12 months  09/23/2019-FVC 3.74 (81% predicted), postbronchodilator ratio 80, postbronchodilator FEV1 2.88 (86% predicted), no bronchodilator response, DLCO 19.44 (73% predicted)  ? DDD (degenerative disc disease), cervical 05/13/2008  ? Cervical Disc Degeneration  ? Depression   ? History of adenomatous polyp of colon 03/03/2018  ? 05/14/17: Colonoscopy: nonadvanced adenoma, f/u 5 yrs, use SuPrep, Murphy/GAP  Last Assessment & Plan:  Relevant Hx: Course: Daily Update: Today's Plan:  ? Hyperlipidemia   ? Kidney stone   ? Nephrolithiasis 05/22/2011  ? Nontraumatic incomplete tear of right rotator cuff 10/14/2018  ? Other and unspecified hyperlipidemia  11/20/2009  ? Hyperlipidemia  ? RBBB 06/09/2012  ? Secondary hypercoagulable state ([place]) 09/28/2019  ? Shortness of breath 09/23/2019  ? ? ?Past Surgical History:  ?Procedure Laterality Date  ? CARDIOVERSION N/A 10/15/2019  ? Procedure: CARDIOVERSION;  Surgeon: Jerline Pain, MD;  [facility] END[facility]COPY;  Service: Cardiovascular;  Laterality: N/A;  ? CARDIOVERSION N/A 11/29/2019  ? Procedure: CARDIOVERSION;  Surgeon: Dorothy Spark, MD;  Location: [place];  Service: Cardiovascular;  Laterality: N/A;  ? HEMORRHOID SURGERY    ? 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 Procedure: TOTAL HIP ARTHROPLASTY ANTERIOR APPROACH;  Surgeon: Dorna Leitz, MD;  Location: [facility];  Service: Orthopedics;  Laterality: Right;  ? VARICOSE VEIN SURGERY Right   ? ? ?There were no vitals filed for this visit. ? ? Subjective Assessment - 02/15/22 1104   ? ? Subjective Pt notes improvements in his hip flexion but still not quite to level he wants to don/doff clothes.   ? Pertinent History RTC surgery 4 years ago   ? How long can you sit comfortably? n/a   ? How long can you stand comfortably? <5 minutes   ? How long can you walk comfortably? Able to get around house   ? Patient Stated Goals Cutting grass/yard work, cooking, car maintenance   ? Currently in Pain? Yes   ? Pain Score 1    ? Pain Location Hip   ? Pain Orientation Right   ? Pain Onset 1 to 4 weeks ago   ? ?  ?  ? ?  ? ? ? ? ?  [facility] PT Assessment - 02/15/22 0001   ? ?  ? Assessment  ? Medical Diagnosis THA   ? Referring Provider (PT) Dorna Leitz   ? Onset Date/Surgical Date 12/21/21   ? Hand Dominance Right   ? Next MD Visit In 2 more months   ? ?  ?  ? ?  ? ? ? ? ? ? ? ? ? ? ? ? ? ? ? ? [facility] PT Treatment/Exercise - 02/15/22 0001   ? ?  ? Knee/Hip Exercises: Stretches  ? Piriformis Stretch Right;2 reps;30 seconds   ? Other Knee/Hip Stretches butterfly groin stretch supine 2x30 sec   ? Other Knee/Hip Stretches knee to chest 2x30 sec   ?  ? Knee/Hip Exercises:  Aerobic  ? Tread Mill 2.0 mph x 5 min   ?  ? Knee/Hip Exercises: Standing  ? Other Standing Knee Exercises self mob for inferior/posterior glide and then with lateral glide with belt for hip flexion x10 each   ?  ? Knee/Hip Exercises: Supine  ? Other Supine Knee/Hip Exercises hip flexion AAROM at end range x10 with manual assist   ? Other Supine Knee/Hip Exercises hip flexion red tband 2x10   ?  ? Knee/Hip Exercises: Prone  ? Other Prone Exercises quadruped into child's pose 10x5 sec   ?  ? Manual Therapy  ? Manual therapy comments LAD R LE; grade [name] inferior and posterior hip mobilization   ? ?  ?  ? ?  ? ? ? ? ? ? ? ? ? ? ? ? PT Short Term Goals - 01/18/22 1137   ? ?  ? PT SHORT TERM GOAL #1  ? Title independent with initial HEP   ? Time 4   ? Period Weeks   ? Status Achieved   ? Target Date 01/21/22   ?  ? PT SHORT TERM GOAL #2  ? Title Pt will have improved 5x STS to </=13 sec to demo improved functional LE strength   ? Baseline 11 sec   ? Time 4   ? Period Weeks   ? Status Achieved   ? Target Date 01/21/22   ?  ? PT SHORT TERM GOAL #3  ? Title Pt will improve TUG score to </=13 sec with LRAD to demo decreased fall risk   ? Baseline 8 sec   ? Time 4   ? Period Weeks   ? Status Achieved   ? Target Date 01/21/22   ?  ? PT SHORT TERM GOAL #4  ? Title PT will assess Berg Balance Score to obtain baseline score   ? Baseline 45/56 on 12/26/21   ? Time 2   ? Period Weeks   ? Status Achieved   ? Target Date 01/07/22   ? ?  ?  ? ?  ? ? ? ? PT Long Term Goals - 01/24/22 1025   ? ?  ? PT LONG TERM GOAL #1  ? Title independent with advanced HEP   ? Time 8   ? Period Weeks   ? Status On-going   ? Target Date 02/18/22   ?  ? PT LONG TERM GOAL #2  ? Title Pt will have improved FOTO score to 70   ? Time 8   ? Period Weeks   ? Status On-going   ? Target Date 02/18/22   ?  ? PT LONG TERM GOAL #3  ? Title Pt will be able to amb at least 1000'  in the community with [place]   ? Time 8   ? Period Weeks   ? Status On-going   ? Target Date  02/18/22   ?  ? PT LONG TERM GOAL #4  ? Title Pt will demo at least 8 point increase in [place] Score for MDIC   ? Baseline TBA   ? Time 8   ? Period Weeks   ? Status Achieved   ? Target Date 02/18/22   ?  ? PT LONG TERM GOAL #5  ? Title Pt will demo at least 4/5 strength in R LE   ? Time 8   ? Period Weeks   ? Status On-going   ? Target Date 02/18/22   ?  ? PT LONG TERM GOAL #6  ? Title Pt will be (-) with all canalith testing positions to demo resolution of BPPV   ? Status Achieved   ? Target Date 02/18/22   ? ?  ?  ? ?  ? ? ? ? ? ? ? ? Plan - 02/15/22 1129   ? ? Clinical Impression Statement Focused on improving hip flexion per pt's personal goals. Worked on ROM and strength this session in various positions.   ? Personal Factors and Comorbidities Age;Fitness;Time since onset of injury/illness/exacerbation   ? Examination-Activity Limitations Locomotion Level;Transfers;Squat;Stairs;Stand;Lift;Toileting;Carry;Bed Mobility;Bathing   ? Examination-Participation Restrictions Meal Prep;Cleaning;Community Activity;Yard Work;Shop;Driving   ? Stability/Clinical Decision Making Stable/Uncomplicated   ? Rehab Potential Good   ? PT Frequency 2x / week   ? PT Duration 8 weeks   ? PT Treatment/Interventions ADLs/Self Care Home Management;Aquatic Therapy;Cryotherapy;Electrical Stimulation;Iontophoresis '4mg'$ /ml Dexamethasone;Moist Heat;DME Instruction;Ultrasound;Gait training;Stair training;Functional mobility training;Therapeutic activities;Therapeutic exercise;Balance training;Neuromuscular re-education;Manual techniques;Patient/family education;Dry needling;Passive range of motion;Taping;Vasopneumatic Device;Vestibular   ? PT Next Visit Plan Continue to work on LE stability and balance (single leg). Continue single leg strengthening.   ? PT Home Exercise Plan Access Code: [email or web address]   ? Consulted and Agree with Plan of Care Patient;Family member/caregiver   ? ?  ?  ? ?  ? ? ?Patient will benefit from skilled therapeutic  intervention in order to improve the following deficits and impairments:  Abnormal gait, Decreased range of motion, Difficulty walking, Decreased endurance, Pain, Decreased balance, Decreased mobility, Dec

## 2022-02-18 ENCOUNTER — Ambulatory Visit: Payer: Medicare HMO | Admitting: Physical Therapy

## 2022-02-18 DIAGNOSIS — R2689 Other abnormalities of gait and mobility: Secondary | ICD-10-CM

## 2022-02-18 DIAGNOSIS — M25651 Stiffness of right hip, not elsewhere classified: Secondary | ICD-10-CM | POA: Diagnosis not present

## 2022-02-18 DIAGNOSIS — R262 Difficulty in walking, not elsewhere classified: Secondary | ICD-10-CM

## 2022-02-18 DIAGNOSIS — M6281 Muscle weakness (generalized): Secondary | ICD-10-CM

## 2022-02-18 DIAGNOSIS — M25551 Pain in right hip: Secondary | ICD-10-CM | POA: Diagnosis not present

## 2022-02-18 NOTE — Therapy (Signed)
[place] ?[facility] ?[place] ?[place], [place], [address] ?Phone: [phone]   Fax:  [phone] ? ?Physical Therapy Treatment and Discharge ? ?Patient Details  ?Name: Joshua [name] ?MRN: [id number] ?Date of Birth: 12/26/1945 ?Referring Provider (PT): Dorna Leitz ? ? ?PHYSICAL THERAPY DISCHARGE SUMMARY ? ?Visits from Start of Care: 17 ? ?Current functional level related to goals / functional outcomes: ?See below ?  ?Remaining deficits: ?Some continued glute weakness (hip abd and ext) bilat ?  ?Education / Equipment: ?See below  ? ?Patient agrees to discharge. Patient goals were met. Patient is being discharged due to meeting the stated rehab goals. ? ? ?Encounter Date: 02/18/2022 ? ? PT End of Session - 02/18/22 0803   ? ? Visit Number 17   ? Number of Visits 16   ? Date for PT Re-Evaluation 02/18/22   ? Authorization Type [employer]   ? Progress Note Due on Visit 10   ? PT Start Time [phone]   ? PT Stop Time 0845   ? PT Time Calculation (min) 42 min   ? Activity Tolerance Patient tolerated treatment well   ? Behavior During Therapy [facility] for tasks assessed/performed   ? ?  ?  ? ?  ? ? ?Past Medical History:  ?Diagnosis Date  ? Abnormal findings on diagnostic imaging of lung 09/23/2019  ? 09/22/2019-lung cancer screening-centrilobular and paraseptal emphysema, calcified granulomata noted bilaterally, no suspicious nodules, lung RADS 1, repeat in 12 months   ? Anxiety   ? Atrial fibrillation ([place]) 09/23/2019  ? Centrilobular emphysema ([place]) 09/23/2019  ? 09/22/2019-lung cancer screening-centrilobular and paraseptal emphysema, calcified granulomata noted bilaterally, no suspicious nodules, lung RADS 1, repeat in 12 months  09/23/2019-FVC 3.74 (81% predicted), postbronchodilator ratio 80, postbronchodilator FEV1 2.88 (86% predicted), no bronchodilator response, DLCO 19.44 (73% predicted)  ? DDD (degenerative disc disease), cervical 05/13/2008  ? Cervical  Disc Degeneration  ? Depression   ? History of adenomatous polyp of colon 03/03/2018  ? 05/14/17: Colonoscopy: nonadvanced adenoma, f/u 5 yrs, use SuPrep, [place]/GAP  Last Assessment & Plan:  Relevant Hx: Course: Daily Update: Today's Plan:  ? Hyperlipidemia   ? Kidney stone   ? Nephrolithiasis 05/22/2011  ? Nontraumatic incomplete tear of right rotator cuff 10/14/2018  ? Other and unspecified hyperlipidemia 11/20/2009  ? Hyperlipidemia  ? RBBB 06/09/2012  ? Secondary hypercoagulable state ([place]) 09/28/2019  ? Shortness of breath 09/23/2019  ? ? ?Past Surgical History:  ?Procedure Laterality Date  ? CARDIOVERSION N/A 10/15/2019  ? Procedure: CARDIOVERSION;  Surgeon: Jerline Pain, MD;  Location: [facility] ENDOSCOPY;  Service: Cardiovascular;  Laterality: N/A;  ? CARDIOVERSION N/A 11/29/2019  ? Procedure: CARDIOVERSION;  Surgeon: Dorothy Spark, MD;  Location: [place];  Service: Cardiovascular;  Laterality: N/A;  ? HEMORRHOID SURGERY    ? 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 Procedure: TOTAL HIP ARTHROPLASTY ANTERIOR APPROACH;  Surgeon: Dorna Leitz, MD;  Location: [facility];  Service: Orthopedics;  Laterality: Right;  ? VARICOSE VEIN SURGERY Right   ? ? ?There were no vitals filed for this visit. ? ? Subjective Assessment - 02/18/22 0804   ? ? Subjective Pt states he has not been able to find a good spot to work on his hip self mobilizations at home. He has been doing his other stretches and exercises.   ? Pertinent History RTC surgery 4 years ago   ? How long can you sit comfortably? n/a   ? How long can  you stand comfortably? <5 minutes   ? How long can you walk comfortably? Able to get around house   ? Patient Stated Goals Cutting grass/yard work, cooking, car maintenance   ? Currently in Pain? Yes   ? Pain Score 1    ? Pain Location Hip   ? Pain Orientation Right   ? Pain Onset 1 to 4 weeks ago   ? ?  ?  ? ?  ? ? ? ? ? [facility] PT Assessment - 02/18/22 0001   ? ?  ? Assessment  ? Medical Diagnosis THA   ?  Referring Provider (PT) Dorna Leitz   ? Onset Date/Surgical Date 12/21/21   ? Hand Dominance Right   ?  ? Observation/Other Assessments  ? Focus on Therapeutic Outcomes (FOTO)  75   ?  ? Strength  ? Right Hip Flexion 5/5   ? Right Hip Extension 4/5   ? Right Hip External Rotation  4/5   ? Right Hip Internal Rotation 4/5   ? Right Hip ABduction 4/5   ? Right Hip ADduction 4/5   ? Right Knee Flexion 5/5   ? Right Knee Extension 5/5   ? ?  ?  ? ?  ? ? ? ? ? ? ? ? ? ? ? ? ? ? ? ? [facility] Adult PT Treatment/Exercise - 02/18/22 0001   ? ?  ? Knee/Hip Exercises: Stretches  ? Other Knee/Hip Stretches butterfly groin stretch supine 2x30 sec   ? Other Knee/Hip Stretches figure 4 stretch x30 sec; knee to chest x30 sec   ?  ? Knee/Hip Exercises: Aerobic  ? Tread Mill 2.0 mph x 6 min   ?  ? Knee/Hip Exercises: Standing  ? Other Standing Knee Exercises lateral band walk red tband x10; backwards monster walk red tband x10   ?  ? Knee/Hip Exercises: Supine  ? Other Supine Knee/Hip Exercises Marching 2x10   ?  ? Manual Therapy  ? Manual therapy comments LAD R LE; grade [name] inferior and posterior hip mobilization   ? ?  ?  ? ?  ? ? ? ? ? ? ? ? ? ? ? ? PT Short Term Goals - 01/18/22 1137   ? ?  ? PT SHORT TERM GOAL #1  ? Title independent with initial HEP   ? Time 4   ? Period Weeks   ? Status Achieved   ? Target Date 01/21/22   ?  ? PT SHORT TERM GOAL #2  ? Title Pt will have improved 5x STS to </=13 sec to demo improved functional LE strength   ? Baseline 11 sec   ? Time 4   ? Period Weeks   ? Status Achieved   ? Target Date 01/21/22   ?  ? PT SHORT TERM GOAL #3  ? Title Pt will improve TUG score to </=13 sec with LRAD to demo decreased fall risk   ? Baseline 8 sec   ? Time 4   ? Period Weeks   ? Status Achieved   ? Target Date 01/21/22   ?  ? PT SHORT TERM GOAL #4  ? Title PT will assess Berg Balance Score to obtain baseline score   ? Baseline 45/56 on 12/26/21   ? Time 2   ? Period Weeks   ? Status Achieved   ? Target Date 01/07/22    ? ?  ?  ? ?  ? ? ? ? PT Long Term Goals -  02/18/22 0819   ? ?  ? PT LONG TERM GOAL #1  ? Title independent with advanced HEP   ? Time 8   ? Period Weeks   ? Status Achieved   ? Target Date 02/18/22   ?  ? PT LONG TERM GOAL #2  ? Title Pt will have improved FOTO score to 70   ? Time 8   ? Period Weeks   ? Status Achieved   ? Target Date 02/18/22   ?  ? PT LONG TERM GOAL #3  ? Title Pt will be able to amb at least 1000' in the community with [place]   ? Time 8   ? Period Weeks   ? Status Achieved   ? Target Date 02/18/22   ?  ? PT LONG TERM GOAL #4  ? Title Pt will demo at least 8 point increase in [place] Score for MDIC   ? Baseline TBA   ? Time 8   ? Period Weeks   ? Status Achieved   ? Target Date 02/18/22   ?  ? PT LONG TERM GOAL #5  ? Title Pt will demo at least 4/5 strength in R LE   ? Time 8   ? Period Weeks   ? Status Achieved   ? Target Date 02/18/22   ?  ? PT LONG TERM GOAL #6  ? Title Pt will be (-) with all canalith testing positions to demo resolution of BPPV   ? Status Achieved   ? Target Date 02/18/22   ? ?  ?  ? ?  ? ? ? ? ? ? ? ? Plan - 02/18/22 0818   ? ? Clinical Impression Statement Continued to work on improving hip flexion. Reviewed goals. Pt has met all LTGs at this time. He is ready for PT d/c.   ? Personal Factors and Comorbidities Age;Fitness;Time since onset of injury/illness/exacerbation   ? Examination-Activity Limitations Locomotion Level;Transfers;Squat;Stairs;Stand;Lift;Toileting;Carry;Bed Mobility;Bathing   ? Examination-Participation Restrictions Meal Prep;Cleaning;Community Activity;Yard Work;Shop;Driving   ? Stability/Clinical Decision Making Stable/Uncomplicated   ? Rehab Potential Good   ? PT Frequency 2x / week   ? PT Duration 8 weeks   ? PT Treatment/Interventions ADLs/Self Care Home Management;Aquatic Therapy;Cryotherapy;Electrical Stimulation;Iontophoresis 14m/ml Dexamethasone;Moist Heat;DME Instruction;Ultrasound;Gait training;Stair training;Functional mobility  training;Therapeutic activities;Therapeutic exercise;Balance training;Neuromuscular re-education;Manual techniques;Patient/family education;Dry needling;Passive range of motion;Taping;Vasopneumatic Device;Vestibular   ? P

## 2022-02-22 ENCOUNTER — Encounter: Payer: Medicare HMO | Admitting: Physical Therapy

## 2022-02-27 ENCOUNTER — Telehealth: Payer: Self-pay

## 2022-02-27 MED ORDER — PREDNISONE 50 MG PO TABS: ORAL_TABLET | ORAL | 0 refills | Status: DC

## 2022-02-27 NOTE — Telephone Encounter (Signed)
To PCP

## 2022-02-27 NOTE — Telephone Encounter (Signed)
Joshua [name] called stating that patient tested positive for COVID on Saturday and is doing well except for the cough. She is concerned because of his emphysema that he may need something to control the cough. Please advise.  ?

## 2022-03-04 ENCOUNTER — Ambulatory Visit (INDEPENDENT_AMBULATORY_CARE_PROVIDER_SITE_OTHER): Payer: Medicare HMO

## 2022-03-04 ENCOUNTER — Ambulatory Visit: Payer: Medicare HMO | Admitting: Adult Health

## 2022-03-04 ENCOUNTER — Encounter: Payer: Self-pay | Admitting: Adult Health

## 2022-03-04 VITALS — BP 120/70 | HR 83 | Temp 98.2°F | Ht 71.0 in | Wt 214.8 lb

## 2022-03-04 DIAGNOSIS — J208 Acute bronchitis due to other specified organisms: Secondary | ICD-10-CM

## 2022-03-04 DIAGNOSIS — U071 COVID-19: Secondary | ICD-10-CM | POA: Insufficient documentation

## 2022-03-04 DIAGNOSIS — J432 Centrilobular emphysema: Secondary | ICD-10-CM

## 2022-03-04 DIAGNOSIS — G4733 Obstructive sleep apnea (adult) (pediatric): Secondary | ICD-10-CM

## 2022-03-04 DIAGNOSIS — R059 Cough, unspecified: Secondary | ICD-10-CM | POA: Diagnosis not present

## 2022-03-04 DIAGNOSIS — J209 Acute bronchitis, unspecified: Secondary | ICD-10-CM | POA: Insufficient documentation

## 2022-03-04 MED ORDER — AMOXICILLIN-POT CLAVULANATE 875-125 MG PO TABS: 1.0000 | ORAL_TABLET | Freq: Two times a day (BID) | ORAL | 0 refills | Status: AC

## 2022-03-04 MED ORDER — BENZONATATE 200 MG PO CAPS: 200.0000 mg | ORAL_CAPSULE | Freq: Three times a day (TID) | ORAL | 1 refills | Status: DC | PRN

## 2022-03-04 NOTE — Patient Instructions (Addendum)
Augmentin '875mg'$  Twice daily  for 1 week , take with food.  ?Mucinex DM Twice daily  As needed  cough/congestion  ?Tessalon Three times a day  As needed  cough  ?Saline nasal rinses As needed   ?Zyrtec '10mg'$  At bedtime  As needed  drainage  ?Call back if you change your mind regarding your sleep apnea.  ?You are overdue for your lung cancer screening CT chest  ?Follow up with Dr. Hermina Staggers in 3 months and As needed   ?Please contact office for sooner follow up if symptoms do not improve or worsen or seek emergency care  ? ?

## 2022-03-04 NOTE — Assessment & Plan Note (Signed)
Acute bronchitis.  Patient is to begin empiric antibiotics.  Check chest x-ray for possible pneumonia. ? ?Plan  ?Patient Instructions  ?Augmentin '875mg'$  Twice daily  for 1 week , take with food.  ?Mucinex DM Twice daily  As needed  cough/congestion  ?Tessalon Three times a day  As needed  cough  ?Saline nasal rinses As needed   ?Zyrtec '10mg'$  At bedtime  As needed  drainage  ?Call back if you change your mind regarding your sleep apnea.  ?You are overdue for your lung cancer screening CT chest  ?Follow up with Dr. Hermina Staggers in 3 months and As needed   ?Please contact office for sooner follow up if symptoms do not improve or worsen or seek emergency care  ? ?  ? ?

## 2022-03-04 NOTE — Assessment & Plan Note (Signed)
Recent COVID-19 infection and fully vaccinated.  Patient had minimum cold-like symptoms.  However symptoms have progressively worsened over the last few days.  Now with a post viral bronchitis.  Will need to check x-ray for possible underlying pneumonia.  We will begin empiric antibiotics. ? ?Plan  ?Patient Instructions  ?Augmentin '875mg'$  Twice daily  for 1 week , take with food.  ?Mucinex DM Twice daily  As needed  cough/congestion  ?Tessalon Three times a day  As needed  cough  ?Saline nasal rinses As needed   ?Zyrtec '10mg'$  At bedtime  As needed  drainage  ?Call back if you change your mind regarding your sleep apnea.  ?You are overdue for your lung cancer screening CT chest  ?Follow up with Dr. Hermina Staggers in 3 months and As needed   ?Please contact office for sooner follow up if symptoms do not improve or worsen or seek emergency care  ? ?  ? ?

## 2022-03-04 NOTE — Assessment & Plan Note (Signed)
Mild emphysema -on no maintenance inhalers.  Previous PFTs were relatively normal.  Patient is encouraged to continue with the lung cancer screening program. ? ?Plan  ?Patient Instructions  ?Augmentin '875mg'$  Twice daily  for 1 week , take with food.  ?Mucinex DM Twice daily  As needed  cough/congestion  ?Tessalon Three times a day  As needed  cough  ?Saline nasal rinses As needed   ?Zyrtec '10mg'$  At bedtime  As needed  drainage  ?Call back if you change your mind regarding your sleep apnea.  ?You are overdue for your lung cancer screening CT chest  ?Follow up with Dr. Hermina Staggers in 3 months and As needed   ?Please contact office for sooner follow up if symptoms do not improve or worsen or seek emergency care  ? ?  ? ?

## 2022-03-04 NOTE — Assessment & Plan Note (Signed)
Moderate obstructive sleep apnea.  Patient declines CPAP start.  Patient education was given.  Patient is to call us back if he changes his mind on CPAP. ?

## 2022-03-04 NOTE — Progress Notes (Signed)
? ?'@Patient'$  ID: Joshua [name], male    DOB: February 24, 1946, 76 y.o.   MRN: [id number] ? ?Chief Complaint  ?Patient presents with  ? Acute Visit  ? ? ?Referring provider: ?[name], DO ? ?HPI: ?76 year old male former smoker heavy smoking year history with a 90-pack-year history followed for mild emphysema on CT imaging.  Relatively normal PFTs.  And obstructive sleep apnea ? ?TEST/EVENTS :  ?Home sleep study December 06, 2019 showed moderate sleep apnea ? ?03/04/2022 Acute OV  ?Patient presents for an acute office visit.  Last seen January 17 2020.  Patient has some mild emphysema on CT imaging.  Is not on any maintenance regimen.  Had previously tried [employer] with no perceived benefits.  Previous PFTs were relatively normal..  Patient says that he got COVID-19 infection about 7 to 10 days ago.  Had been fully vaccinated.  Did not require antivirals.  Patient says he was called in a 5-day course of prednisone.  Patient says he has mild cold-like symptoms.  However over the last 2 to 3 days he has had increased cough congestion with thick green mucus.  Has some fatigue and low energy.  He denies any hemoptysis.  No calf pain.  Patient does have A-fib and is on anticoagulation therapy with Eliquis. ?Patient says his appetite is good with no nausea vomiting diarrhea.  His wife also had COVID.  She had mild symptoms only. ? ?Patient does have mild emphysema.  Participates in the lung cancer screening program.  Last CT was November 2021 that showed lung RADS 1 with mild emphysema.  Some subpleural reticulation and groundglass. ?Patient is overdue for his lung cancer screening CT chest. ? ?Patient has underlying sleep apnea.  He was diagnosed on home sleep study December 06, 2019.  He was recommended to start on CPAP.  Patient says he got his CPAP but returned to he said it was no way that he is going to wear this.  We discussed the implications of sleep apnea.  Patient does not want to restart CPAP. ? ? ? ?Allergies   ?Allergen Reactions  ? Venlafaxine Palpitations  ? Bupropion Other (See Comments)  ?  unknown  ? Tramadol Other (See Comments)  ?  Muscle spasm  ? Zoloft [Sertraline Hcl] Other (See Comments)  ?  Tick  ? ? ?Immunization History  ?Administered Date(s) Administered  ? Fluad Quad(high Dose 65+) 06/21/2019, 08/14/2020, 07/13/2021  ? Hep A / Hep B 09/03/2019, 10/04/2019, 01/31/2020  ? Hepatitis A 10/04/2019, 01/31/2020  ? Hepatitis A, Adult 09/03/2019  ? Hepatitis B 09/03/2019, 10/04/2019, 01/31/2020  ? Influenza Split 08/18/2014, 07/05/2017  ? Influenza, High Dose Seasonal PF 09/04/2014, 08/21/2015, 08/04/2016  ? Influenza,inj,Quad PF,6+ Mos 08/11/2019  ? Influenza,trivalent, recombinat, inj, PF 08/16/2013  ? Influenza-Unspecified 11/04/2012, 08/28/2018, 08/04/2020, 08/20/2021  ? PFIZER(Purple Top)SARS-COV-2 Vaccination 01/04/2019, 02/01/2019, 08/21/2020, 03/02/2021, 08/20/2021  ? Pension scheme manager 42yr & up 08/20/2021  ? Pneumococcal Conjugate-13 05/25/2014, 10/05/2018  ? Pneumococcal Polysaccharide-23 08/16/2013, 06/01/2015  ? Tdap 08/04/2008, 05/24/2009, 02/01/2016  ? Zoster Recombinat (Shingrix) 09/03/2019, 12/03/2019  ? Zoster, Live 08/15/2015  ? ? ?Past Medical History:  ?Diagnosis Date  ? Abnormal findings on diagnostic imaging of lung 09/23/2019  ? 09/22/2019-lung cancer screening-centrilobular and paraseptal emphysema, calcified granulomata noted bilaterally, no suspicious nodules, lung RADS 1, repeat in 12 months   ? Anxiety   ? Atrial fibrillation ([place] 09/23/2019  ? Centrilobular emphysema ([place] 09/23/2019  ? 09/22/2019-lung cancer screening-centrilobular and paraseptal emphysema, calcified granulomata  noted bilaterally, no suspicious nodules, lung RADS 1, repeat in 12 months  09/23/2019-FVC 3.74 (81% predicted), postbronchodilator ratio 80, postbronchodilator FEV1 2.88 (86% predicted), no bronchodilator response, DLCO 19.44 (73% predicted)  ? DDD (degenerative disc disease),  cervical 05/13/2008  ? Cervical Disc Degeneration  ? Depression   ? History of adenomatous polyp of colon 03/03/2018  ? 05/14/17: Colonoscopy: nonadvanced adenoma, f/u 5 yrs, use SuPrep, [place]/GAP  Last Assessment & Plan:  Relevant Hx: Course: Daily Update: Today's Plan:  ? Hyperlipidemia   ? Kidney stone   ? Nephrolithiasis 05/22/2011  ? Nontraumatic incomplete tear of right rotator cuff 10/14/2018  ? Other and unspecified hyperlipidemia 11/20/2009  ? Hyperlipidemia  ? RBBB 06/09/2012  ? Secondary hypercoagulable state ([place]) 09/28/2019  ? Shortness of breath 09/23/2019  ? ? ?Tobacco History: ?Social History  ? ?Tobacco Use  ?Smoking Status Former  ? Packs/day: 2.00  ? Years: 46.00  ? Pack years: 92.00  ? Types: Cigarettes  ? Quit date: 2010  ? Years since quitting: 13.3  ?Smokeless Tobacco Never  ? ?Counseling given: Not Answered ? ? ?Outpatient Medications Prior to Visit  ?Medication Sig Dispense Refill  ? amiodarone (PACERONE) 200 MG tablet TAKE 1/2 TABLET BY MOUTH DAILY 45 tablet 2  ? apixaban (ELIQUIS) 5 MG TABS tablet Take 5 mg by mouth 2 (two) times daily.    ? atorvastatin (LIPITOR) 40 MG tablet TAKE ONE TABLET BY MOUTH DAILY 90 tablet 3  ? clobetasol (TEMOVATE) 0.05 % external solution Apply 1 application topically once a week. Use in hair    ? docusate sodium (COLACE) 100 MG capsule Take 1 capsule (100 mg total) by mouth 2 (two) times daily. 30 capsule 0  ? fluorouracil (EFUDEX) 5 % cream Apply 1 application topically daily as needed (On head over night).    ? FLUoxetine (PROZAC) 20 MG capsule Take 20 mg by mouth daily.     ? ketoconazole (NIZORAL) 2 % shampoo Apply 1 application topically 2 (two) times a week. Monday and Thursday    ? lamoTRIgine (LAMICTAL) 200 MG tablet Take 100 mg by mouth 2 (two) times daily.    ? polyethylene glycol powder (GLYCOLAX/MIRALAX) 17 GM/SCOOP powder Take 17 g by mouth 2 (two) times daily.    ? tamsulosin (FLOMAX) 0.4 MG CAPS capsule Take 0.4 mg by mouth every other day.     ? vitamin B-6 (PYRIDOXINE) 25 MG tablet Take 100 mg by mouth daily. (Patient not taking: Reported on 12/24/2021)    ? oxyCODONE-acetaminophen (PERCOCET/ROXICET) 5-325 MG tablet Take 1-2 tablets by mouth every 6 (six) hours as needed for severe pain. 40 tablet 0  ? predniSONE (DELTASONE) 50 MG tablet Take 1 tab po daily x5 days. (Patient not taking: Reported on 03/04/2022) 5 tablet 0  ? tiZANidine (ZANAFLEX) 2 MG tablet Take 1 tablet (2 mg total) by mouth every 8 (eight) hours as needed for muscle spasms. (Patient not taking: Reported on 03/04/2022) 40 tablet 0  ? tolnaftate (TINACTIN) 1 % cream Apply 1 application topically daily as needed (Fungus around toes). (Patient not taking: Reported on 03/04/2022)    ? ?No facility-administered medications prior to visit.  ? ? ? ?Review of Systems:  ? ?Constitutional:   No  weight loss, night sweats,  Fevers, chills, fatigue, or  lassitude. ? ?HEENT:   No headaches,  Difficulty swallowing,  Tooth/dental problems, or  Sore throat,  ?              No sneezing,  itching, ear ache,  ?+nasal congestion, post nasal drip,  ? ?CV:  No chest pain,  Orthopnea, PND, swelling in lower extremities, anasarca, dizziness, palpitations, syncope.  ? ?GI  No heartburn, indigestion, abdominal pain, nausea, vomiting, diarrhea, change in bowel habits, loss of appetite, bloody stools.  ? ?Resp:.  No chest wall deformity ? ?Skin: no rash or lesions. ? ?GU: no dysuria, change in color of urine, no urgency or frequency.  No flank pain, no hematuria  ? ?MS:  No joint pain or swelling.  No decreased range of motion.  No back pain. ? ? ? ?Physical Exam ? ?BP 120/70 (BP Location: Left Arm, Patient Position: Sitting, Cuff Size: Normal)   Pulse 83   Temp 98.2 ?F (36.8 ?C) (Oral)   Ht '5\' 11"'$  (1.803 m)   Wt 214 lb 12.8 oz (97.4 kg)   SpO2 97%   BMI 29.96 kg/m?  ? ?GEN: A/Ox3; pleasant , NAD, well nourished  ?  ?HEENT:  [place]/AT,  NOSE-clear drainage  THROAT-clear, no lesions, no postnasal drip or exudate noted.   ? ?NECK:  Supple w/ fair ROM; no JVD; normal carotid impulses w/o bruits; no thyromegaly or nodules palpated; no lymphadenopathy.   ? ?RESP  Clear  P & A; w/o, wheezes/ rales/ or rhonchi. no accessory muscle use, no

## 2022-03-21 DIAGNOSIS — F1020 Alcohol dependence, uncomplicated: Secondary | ICD-10-CM | POA: Diagnosis not present

## 2022-03-21 DIAGNOSIS — Z01812 Encounter for preprocedural laboratory examination: Secondary | ICD-10-CM | POA: Diagnosis not present

## 2022-03-22 DIAGNOSIS — Z113 Encounter for screening for infections with a predominantly sexual mode of transmission: Secondary | ICD-10-CM | POA: Diagnosis not present

## 2022-03-22 DIAGNOSIS — Z01812 Encounter for preprocedural laboratory examination: Secondary | ICD-10-CM | POA: Diagnosis not present

## 2022-03-22 DIAGNOSIS — A539 Syphilis, unspecified: Secondary | ICD-10-CM | POA: Diagnosis not present

## 2022-03-22 DIAGNOSIS — Z130 Encounter for screening for diseases of the blood and blood-forming organs and certain disorders involving the immune mechanism: Secondary | ICD-10-CM | POA: Diagnosis not present

## 2022-03-22 DIAGNOSIS — F1020 Alcohol dependence, uncomplicated: Secondary | ICD-10-CM | POA: Diagnosis not present

## 2022-03-22 DIAGNOSIS — Z13228 Encounter for screening for other metabolic disorders: Secondary | ICD-10-CM | POA: Diagnosis not present

## 2022-03-22 DIAGNOSIS — Z20822 Contact with and (suspected) exposure to covid-19: Secondary | ICD-10-CM | POA: Diagnosis not present

## 2022-04-01 DIAGNOSIS — Z13228 Encounter for screening for other metabolic disorders: Secondary | ICD-10-CM | POA: Diagnosis not present

## 2022-04-01 DIAGNOSIS — A539 Syphilis, unspecified: Secondary | ICD-10-CM | POA: Diagnosis not present

## 2022-04-01 DIAGNOSIS — Z130 Encounter for screening for diseases of the blood and blood-forming organs and certain disorders involving the immune mechanism: Secondary | ICD-10-CM | POA: Diagnosis not present

## 2022-04-01 DIAGNOSIS — F1020 Alcohol dependence, uncomplicated: Secondary | ICD-10-CM | POA: Diagnosis not present

## 2022-04-01 DIAGNOSIS — Z01812 Encounter for preprocedural laboratory examination: Secondary | ICD-10-CM | POA: Diagnosis not present

## 2022-04-01 DIAGNOSIS — Z20822 Contact with and (suspected) exposure to covid-19: Secondary | ICD-10-CM | POA: Diagnosis not present

## 2022-04-02 DIAGNOSIS — M7989 Other specified soft tissue disorders: Secondary | ICD-10-CM | POA: Diagnosis not present

## 2022-04-02 DIAGNOSIS — M79604 Pain in right leg: Secondary | ICD-10-CM | POA: Diagnosis not present

## 2022-04-18 ENCOUNTER — Ambulatory Visit (HOSPITAL_COMMUNITY): Payer: Medicare HMO | Admitting: Physician Assistant

## 2022-04-26 DIAGNOSIS — R4189 Other symptoms and signs involving cognitive functions and awareness: Secondary | ICD-10-CM | POA: Diagnosis not present

## 2022-04-26 DIAGNOSIS — R4701 Aphasia: Secondary | ICD-10-CM | POA: Diagnosis not present

## 2022-04-26 DIAGNOSIS — Z789 Other specified health status: Secondary | ICD-10-CM | POA: Diagnosis not present

## 2022-05-02 ENCOUNTER — Encounter (HOSPITAL_COMMUNITY): Payer: Self-pay | Admitting: Physician Assistant

## 2022-05-02 ENCOUNTER — Ambulatory Visit (HOSPITAL_COMMUNITY)
Admission: RE | Admit: 2022-05-02 | Discharge: 2022-05-02 | Disposition: A | Discharge status: Home / Self Care | Payer: Medicare HMO | Source: Ambulatory Visit | Attending: Physician Assistant | Admitting: Physician Assistant

## 2022-05-02 VITALS — BP 124/70 | HR 56 | Ht 71.0 in | Wt 209.6 lb

## 2022-05-02 DIAGNOSIS — D6859 Other primary thrombophilia: Secondary | ICD-10-CM | POA: Insufficient documentation

## 2022-05-02 DIAGNOSIS — F32A Depression, unspecified: Secondary | ICD-10-CM | POA: Diagnosis not present

## 2022-05-02 DIAGNOSIS — I480 Paroxysmal atrial fibrillation: Secondary | ICD-10-CM | POA: Insufficient documentation

## 2022-05-02 DIAGNOSIS — Z79899 Other long term (current) drug therapy: Secondary | ICD-10-CM | POA: Insufficient documentation

## 2022-05-02 DIAGNOSIS — Z87891 Personal history of nicotine dependence: Secondary | ICD-10-CM | POA: Diagnosis not present

## 2022-05-02 DIAGNOSIS — I2510 Atherosclerotic heart disease of native coronary artery without angina pectoris: Secondary | ICD-10-CM | POA: Insufficient documentation

## 2022-05-02 DIAGNOSIS — R0683 Snoring: Secondary | ICD-10-CM | POA: Diagnosis not present

## 2022-05-02 DIAGNOSIS — Z86010 Personal history of colonic polyps: Secondary | ICD-10-CM | POA: Insufficient documentation

## 2022-05-02 DIAGNOSIS — Z823 Family history of stroke: Secondary | ICD-10-CM | POA: Insufficient documentation

## 2022-05-02 DIAGNOSIS — I358 Other nonrheumatic aortic valve disorders: Secondary | ICD-10-CM | POA: Insufficient documentation

## 2022-05-02 DIAGNOSIS — Z87442 Personal history of urinary calculi: Secondary | ICD-10-CM | POA: Insufficient documentation

## 2022-05-02 DIAGNOSIS — I4819 Other persistent atrial fibrillation: Secondary | ICD-10-CM | POA: Diagnosis not present

## 2022-05-02 DIAGNOSIS — Z8489 Family history of other specified conditions: Secondary | ICD-10-CM | POA: Diagnosis not present

## 2022-05-02 DIAGNOSIS — I4510 Unspecified right bundle-branch block: Secondary | ICD-10-CM | POA: Diagnosis not present

## 2022-05-02 DIAGNOSIS — Z7901 Long term (current) use of anticoagulants: Secondary | ICD-10-CM | POA: Diagnosis not present

## 2022-05-02 DIAGNOSIS — R400 Somnolence: Secondary | ICD-10-CM | POA: Diagnosis not present

## 2022-05-02 DIAGNOSIS — E785 Hyperlipidemia, unspecified: Secondary | ICD-10-CM | POA: Diagnosis not present

## 2022-05-02 DIAGNOSIS — I7121 Aneurysm of the ascending aorta, without rupture: Secondary | ICD-10-CM | POA: Insufficient documentation

## 2022-05-02 DIAGNOSIS — I700 Atherosclerosis of aorta: Secondary | ICD-10-CM | POA: Diagnosis not present

## 2022-05-02 DIAGNOSIS — D6869 Other thrombophilia: Secondary | ICD-10-CM | POA: Diagnosis not present

## 2022-05-02 DIAGNOSIS — Z96641 Presence of right artificial hip joint: Secondary | ICD-10-CM | POA: Diagnosis not present

## 2022-05-02 DIAGNOSIS — Z888 Allergy status to other drugs, medicaments and biological substances status: Secondary | ICD-10-CM | POA: Insufficient documentation

## 2022-05-02 DIAGNOSIS — J439 Emphysema, unspecified: Secondary | ICD-10-CM | POA: Diagnosis not present

## 2022-05-02 LAB — COMPREHENSIVE METABOLIC PANEL
ALT: 36 U/L (ref 0–44)
AST: 40 U/L (ref 15–41)
Albumin: 4.1 g/dL (ref 3.5–5.0)
Alkaline Phosphatase: 105 U/L (ref 38–126)
Anion gap: 6 (ref 5–15)
BUN: 14 mg/dL (ref 8–23)
CO2: 26 mmol/L (ref 22–32)
Calcium: 9.5 mg/dL (ref 8.9–10.3)
Chloride: 107 mmol/L (ref 98–111)
Creatinine, Ser: 1.34 mg/dL — ABNORMAL HIGH (ref 0.61–1.24)
GFR, Estimated: 56 mL/min — ABNORMAL LOW (ref >60)
Glucose, Bld: 137 mg/dL — ABNORMAL HIGH (ref 70–99)
Potassium: 4.4 mmol/L (ref 3.5–5.1)
Sodium: 139 mmol/L (ref 135–145)
Total Bilirubin: 1 mg/dL (ref 0.3–1.2)
Total Protein: 6.9 g/dL (ref 6.5–8.1)

## 2022-05-02 LAB — TSH: TSH: 1.342 u[IU]/mL (ref 0.350–4.500)

## 2022-05-02 NOTE — Progress Notes (Signed)
Primary Care Physician: [name], DO Primary Cardiologist: Dr Harriet Masson Primary Electrophysiologist: none Referring Physician: [facility]   Joshua [name] is a 76 y.o. male with a history of paroxysmal atrial fibrillation, emphysema, RBBB, aortic atherosclerosis who presents for follow up in the [facility]. The patient was initially diagnosed with atrial fibrillation in 2013 and saw cardiology at [facility] More recently, he was seen by pulmonology for increased SOB and was found to be in rate controlled afib. His D-dimer was also noted to be elevated and so he was sent to the ER for evaluation. CTA and ultrasounds negative for PE or DVT. Patient was started on Eliquis for a CHADS2VASC score of 2. Patient reports that in hindsight, his SOB has been steadily getting worse for 2-3 months. He does note that his heart rate will jump up to 170s-180 when he walks on the treadmill. He does have occasional palpitations. He admits to snoring and daytime somnolence and has a sleep study pending. He denies significant alcohol use. Patient is s/p unsuccessful DCCV on 10/15/19. Echo showed preserved EF with severely dilated LA. Patient is s/p DCCV on 11/29/19 on amiodarone.    On follow up today, patient has overall done well since his last visit. He did have one episode of "fluttering" in his chest two days ago which lasted for several minutes. Otherwise, he has had no symptoms of afib. He has lost 13 lbs since his last visit with Dr Harriet Masson by eating smaller portions and walking more frequently. He also completed an alcohol rehab program and has not had any alcohol for 45 days, congratulated.   Today, he denies symptoms of palpitations, chest pain, orthopnea, PND, lower extremity edema, dizziness, presyncope, dizziness, syncope, bleeding, or neurologic sequela. The patient is tolerating medications without difficulties and is otherwise without complaint today.    Atrial  Fibrillation Risk Factors:  he does have symptoms or diagnosis of sleep apnea. He is not on CPAP therapy. he does not have a history of rheumatic fever. he does have a history of alcohol use. The patient does not have a history of early familial atrial fibrillation or other arrhythmias.  he has a BMI of Body mass index is 29.23 kg/m.Marland Kitchen Filed Weights   05/02/22 1004  Weight: 95.1 kg    Family History  Problem Relation Age of Onset   Varicose Veins Mother    Stroke Father      Atrial Fibrillation Management history:  Previous antiarrhythmic drugs: amiodarone Previous cardioversions: 10/15/19, 11/29/19 Previous ablations: none CHADS2VASC score: 2 Anticoagulation history: Eliquis   Past Medical History:  Diagnosis Date   Abnormal findings on diagnostic imaging of lung 09/23/2019   09/22/2019-lung cancer screening-centrilobular and paraseptal emphysema, calcified granulomata noted bilaterally, no suspicious nodules, lung RADS 1, repeat in 12 months    Anxiety    Atrial fibrillation ([place]) 09/23/2019   Centrilobular emphysema ([place]) 09/23/2019   09/22/2019-lung cancer screening-centrilobular and paraseptal emphysema, calcified granulomata noted bilaterally, no suspicious nodules, lung RADS 1, repeat in 12 months  09/23/2019-FVC 3.74 (81% predicted), postbronchodilator ratio 80, postbronchodilator FEV1 2.88 (86% predicted), no bronchodilator response, DLCO 19.44 (73% predicted)   DDD (degenerative disc disease), cervical 05/13/2008   Cervical Disc Degeneration   Depression    History of adenomatous polyp of colon 03/03/2018   05/14/17: Colonoscopy: nonadvanced adenoma, f/u 5 yrs, use SuPrep, [place]/GAP  Last Assessment & Plan:  Relevant Hx: Course: Daily Update: Today's Plan:   Hyperlipidemia  Kidney stone    Nephrolithiasis 05/22/2011   Nontraumatic incomplete tear of right rotator cuff 10/14/2018   Other and unspecified hyperlipidemia 11/20/2009   Hyperlipidemia   RBBB  06/09/2012   Secondary hypercoagulable state ([place]) 09/28/2019   Shortness of breath 09/23/2019   Past Surgical History:  Procedure Laterality Date   CARDIOVERSION N/A 10/15/2019   Procedure: CARDIOVERSION;  Surgeon: Jerline Pain, MD;  Location: [facility] ENDOSCOPY;  Service: Cardiovascular;  Laterality: N/A;   CARDIOVERSION N/A 11/29/2019   Procedure: CARDIOVERSION;  Surgeon: Dorothy Spark, MD;  Location: [facility] ENDOSCOPY;  Service: Cardiovascular;  Laterality: N/A;   [place] Right 12/21/2021   Procedure: TOTAL HIP ARTHROPLASTY ANTERIOR APPROACH;  Surgeon: Dorna Leitz, MD;  Location: [facility] ORS;  Service: Orthopedics;  Laterality: Right;   VARICOSE VEIN SURGERY Right     Current Outpatient Medications  Medication Sig Dispense Refill   amiodarone (PACERONE) 200 MG tablet TAKE 1/2 TABLET BY MOUTH DAILY 45 tablet 2   apixaban (ELIQUIS) 5 MG TABS tablet Take 5 mg by mouth 2 (two) times daily.     atorvastatin (LIPITOR) 40 MG tablet TAKE ONE TABLET BY MOUTH DAILY 90 tablet 3   benzonatate (TESSALON) 200 MG capsule Take 1 capsule (200 mg total) by mouth 3 (three) times daily as needed for cough. 30 capsule 1   clobetasol (TEMOVATE) 0.05 % external solution Apply 1 application topically once a week. Use in hair     docusate sodium (COLACE) 100 MG capsule Take 1 capsule (100 mg total) by mouth 2 (two) times daily. 30 capsule 0   fluorouracil (EFUDEX) 5 % cream Apply 1 application topically daily as needed (On head over night).     FLUoxetine (PROZAC) 20 MG capsule Take 20 mg by mouth daily.      ketoconazole (NIZORAL) 2 % shampoo Apply 1 application topically 2 (two) times a week. Monday and Thursday     lamoTRIgine (LAMICTAL) 200 MG tablet Take 100 mg by mouth 2 (two) times daily.     naltrexone (DEPADE) 50 MG tablet Take 50 mg by mouth every morning.     polyethylene glycol powder (GLYCOLAX/MIRALAX) 17 GM/SCOOP powder Take 17 g by mouth 2 (two) times daily.      tamsulosin (FLOMAX) 0.4 MG CAPS capsule Take 0.4 mg by mouth every other day.     vitamin B-6 (PYRIDOXINE) 25 MG tablet Take 100 mg by mouth daily.     No current facility-administered medications for this encounter.    Allergies  Allergen Reactions   Venlafaxine Palpitations   Bupropion Other (See Comments)    unknown   Tramadol Other (See Comments)    Muscle spasm   Zoloft [Sertraline Hcl] Other (See Comments)    Tick    Social History   Socioeconomic History   Marital status: Married    Spouse name: Not on file   Number of children: Not on file   Years of education: Not on file   Highest education level: Not on file  Occupational History   Not on file  Tobacco Use   Smoking status: Former    Packs/day: 2.00    Years: 46.00    Total pack years: 92.00    Types: Cigarettes    Quit date: 2010    Years since quitting: 13.4   Smokeless tobacco: Never   Tobacco comments:    Former smoker 05/02/22  Vaping Use   Vaping Use: Never used  Substance  and Sexual Activity   Alcohol use: Not Currently    Alcohol/week: 14.0 standard drinks of alcohol    Types: 14 Cans of beer per week    Comment: stop drinking 44 days ago 05/02/22   Drug use: No   Sexual activity: Yes    Partners: Female  Other Topics Concern   Not on file  Social History Narrative   Not on file   Social Determinants of Health   Financial Resource Strain: Not on file  Food Insecurity: Not on file  Transportation Needs: Not on file  Physical Activity: Not on file  Stress: Not on file  Social Connections: Not on file  Intimate Partner Violence: Not on file     ROS- All systems are reviewed and negative except as per the HPI above.  Physical Exam: Vitals:   05/02/22 1004  BP: 124/70  Pulse: (!) 56  Weight: 95.1 kg  Height: '5\' 11"'$  (1.803 m)    GEN- The patient is a well appearing elderly male, alert and oriented x 3 today.   HEENT-head normocephalic, atraumatic, sclera clear, conjunctiva  pink, hearing intact, trachea midline. Lungs- Clear to ausculation bilaterally, normal work of breathing Heart- Regular rate and rhythm, no murmurs, rubs or gallops  GI- soft, NT, ND, + BS Extremities- no clubbing, cyanosis, or edema MS- no significant deformity or atrophy Skin- no rash or lesion Psych- euthymic mood, full affect Neuro- strength and sensation are intact   Wt Readings from Last 3 Encounters:  05/02/22 95.1 kg  03/04/22 97.4 kg  12/21/21 100.7 kg    EKG today demonstrates  SB, RBBB Vent. rate 56 BPM PR interval 138 ms QRS duration 162 ms QT/QTcB 494/476 ms  Echo 10/25/20  1. Left ventricular ejection fraction, by estimation, is 55 to 60%. The  left ventricle has normal function. The left ventricle has no regional  wall motion abnormalities. Left ventricular diastolic parameters are  consistent with Grade I diastolic  dysfunction (impaired relaxation).   2. Right ventricular systolic function is normal. The right ventricular  size is moderately enlarged.   3. Left atrial size was moderately dilated.   4. Right atrial size was mildly dilated.   5. The mitral valve is normal in structure. Trivial mitral valve  regurgitation. No evidence of mitral stenosis.   6. The aortic valve is normal in structure. Aortic valve regurgitation is  not visualized. Mild aortic valve sclerosis is present, with no evidence  of aortic valve stenosis.   7. Aneurysm of the ascending aorta, measuring 42 mm.   8. The inferior vena cava is normal in size with greater than 50%  respiratory variability, suggesting right atrial pressure of 3 mmHg.   Comparison(s): EF 65-70, enlarged LA and RA, ascending aorta 29m, no AV  insufficiency or stenosis seen.   Epic records are reviewed at length today   CHA2DS2-VASc Score = 2  The patient's score is based upon: CHF History: 0 HTN History: 0 Diabetes History: 0 Stroke History: 0 Vascular Disease History: 1 Age Score: 1 Gender Score:  0       ASSESSMENT AND PLAN: 1. Persistent Atrial Fibrillation (ICD10:  I48.19) The patient's CHA2DS2-VASc score is 2, indicating a 2.2% annual risk of stroke.   [place] Continue amiodarone 100 mg daily.  Check cmet/TSH today. Continue Eliquis 5 mg BID  2. Secondary Hypercoagulable State (ICD10:  D68.69) The patient is at significant risk for stroke/thromboembolism based upon his CHA2DS2-VASc Score of 2.  Continue Apixaban (  Eliquis).   3. OSA Dx with moderate OSA.  He is not on CPAP.  4. CAD Coronary artery calcification and aortic atherosclerosis seen on chest CT. Myoview 12/20/19 normal. No anginal symptoms.  5. Bradycardia Longstanding and asymptomatic No changes today.   Follow up with Dr Harriet Masson per recall. AF clinic in one year.    [facility] [address] [place], [place] [address] [phone] 05/02/2022 10:39 AM

## 2022-05-09 DIAGNOSIS — M25651 Stiffness of right hip, not elsewhere classified: Secondary | ICD-10-CM | POA: Diagnosis not present

## 2022-05-09 DIAGNOSIS — Z96641 Presence of right artificial hip joint: Secondary | ICD-10-CM | POA: Diagnosis not present

## 2022-05-13 DIAGNOSIS — Z86010 Personal history of colonic polyps: Secondary | ICD-10-CM | POA: Diagnosis not present

## 2022-05-13 DIAGNOSIS — Z09 Encounter for follow-up examination after completed treatment for conditions other than malignant neoplasm: Secondary | ICD-10-CM | POA: Diagnosis not present

## 2022-05-13 DIAGNOSIS — D123 Benign neoplasm of transverse colon: Secondary | ICD-10-CM | POA: Diagnosis not present

## 2022-05-13 DIAGNOSIS — D124 Benign neoplasm of descending colon: Secondary | ICD-10-CM | POA: Diagnosis not present

## 2022-05-13 DIAGNOSIS — D125 Benign neoplasm of sigmoid colon: Secondary | ICD-10-CM | POA: Diagnosis not present

## 2022-05-15 ENCOUNTER — Ambulatory Visit (HOSPITAL_COMMUNITY): Payer: Medicare HMO | Admitting: Physician Assistant

## 2022-05-15 LAB — HM COLONOSCOPY

## 2022-05-16 DIAGNOSIS — L218 Other seborrheic dermatitis: Secondary | ICD-10-CM | POA: Diagnosis not present

## 2022-05-16 DIAGNOSIS — L814 Other melanin hyperpigmentation: Secondary | ICD-10-CM | POA: Diagnosis not present

## 2022-05-16 DIAGNOSIS — Z1283 Encounter for screening for malignant neoplasm of skin: Secondary | ICD-10-CM | POA: Diagnosis not present

## 2022-05-16 DIAGNOSIS — R4701 Aphasia: Secondary | ICD-10-CM | POA: Diagnosis not present

## 2022-05-16 DIAGNOSIS — L570 Actinic keratosis: Secondary | ICD-10-CM | POA: Diagnosis not present

## 2022-05-16 DIAGNOSIS — D1801 Hemangioma of skin and subcutaneous tissue: Secondary | ICD-10-CM | POA: Diagnosis not present

## 2022-05-20 NOTE — Progress Notes (Unsigned)
   I, [name], LAT, ATC acting as a scribe for [name], MD.  Subjective:    CC: L upper arm pain  HPI: Pt is a 76 y/o male c/o L upper arm pain x /. Pt locates pain to   Neck pain: Radiates:  UE Numbness/tingling: UE Weakness: Aggravates: Treatments tried:  Pertinent review of Systems: ***  Relevant historical information: ***   Objective:   There were no vitals filed for this visit. General: Well Developed, well nourished, and in no acute distress.   MSK: ***  Lab and Radiology Results No results found for this or any previous visit (from the past 72 hour(s)). No results found.    Impression and Recommendations:    Assessment and Plan: 76 y.o. male with ***.  PDMP not reviewed this encounter. No orders of the defined types were placed in this encounter.  No orders of the defined types were placed in this encounter.   Discussed warning signs or symptoms. Please see discharge instructions. Patient expresses understanding.   ***

## 2022-05-21 ENCOUNTER — Encounter: Payer: Self-pay | Admitting: Family Medicine

## 2022-05-21 ENCOUNTER — Ambulatory Visit: Payer: Self-pay

## 2022-05-21 ENCOUNTER — Ambulatory Visit: Payer: Medicare HMO | Admitting: Family Medicine

## 2022-05-21 ENCOUNTER — Ambulatory Visit (INDEPENDENT_AMBULATORY_CARE_PROVIDER_SITE_OTHER): Payer: Medicare HMO

## 2022-05-21 VITALS — BP 140/80 | HR 50 | Ht 71.0 in | Wt 212.2 lb

## 2022-05-21 DIAGNOSIS — M25512 Pain in left shoulder: Secondary | ICD-10-CM | POA: Diagnosis not present

## 2022-05-21 DIAGNOSIS — M47812 Spondylosis without myelopathy or radiculopathy, cervical region: Secondary | ICD-10-CM | POA: Diagnosis not present

## 2022-05-21 DIAGNOSIS — M19012 Primary osteoarthritis, left shoulder: Secondary | ICD-10-CM | POA: Diagnosis not present

## 2022-05-21 DIAGNOSIS — M542 Cervicalgia: Secondary | ICD-10-CM

## 2022-05-21 MED ORDER — GABAPENTIN 300 MG PO CAPS: 300.0000 mg | ORAL_CAPSULE | Freq: Three times a day (TID) | ORAL | 2 refills | Status: DC

## 2022-05-21 MED ORDER — PREDNISONE 50 MG PO TABS: 50.0000 mg | ORAL_TABLET | Freq: Every day | ORAL | 0 refills | Status: AC

## 2022-05-21 NOTE — Patient Instructions (Addendum)
Good to see you today.  Prednisone.  Gaba '300mg'$  at night only.  I've referred you to PT at [facility].  Their office will call you to schedule but please let us know if you don't hear from them in one week regarding scheduling.  Please get an Xray today before you leave.  Follow-up: one month

## 2022-05-22 NOTE — Progress Notes (Signed)
Cervical spine x-ray shows arthritis worse at C6-7.

## 2022-05-22 NOTE — Progress Notes (Signed)
Left shoulder x-ray shows a little bit of arthritis of the main shoulder joint and more significant arthritis of the small AC shoulder joint at the top of the shoulder.

## 2022-05-28 ENCOUNTER — Ambulatory Visit: Payer: Medicare HMO | Attending: Family Medicine | Admitting: Physical Therapy

## 2022-05-28 ENCOUNTER — Encounter: Payer: Self-pay | Admitting: Physical Therapy

## 2022-05-28 ENCOUNTER — Other Ambulatory Visit: Payer: Self-pay

## 2022-05-28 DIAGNOSIS — M542 Cervicalgia: Secondary | ICD-10-CM | POA: Insufficient documentation

## 2022-05-28 DIAGNOSIS — M25512 Pain in left shoulder: Secondary | ICD-10-CM | POA: Insufficient documentation

## 2022-05-28 DIAGNOSIS — M6281 Muscle weakness (generalized): Secondary | ICD-10-CM | POA: Diagnosis not present

## 2022-05-28 DIAGNOSIS — R293 Abnormal posture: Secondary | ICD-10-CM | POA: Diagnosis not present

## 2022-05-28 NOTE — Therapy (Addendum)
OUTPATIENT PHYSICAL THERAPY SHOULDER EVALUATION   Patient Name: Joshua [name] MRN: [id number] DOB:07/18/1946, 76 y.o., male Today's Date: 05/28/2022   PT End of Session - 05/28/22 0855     Visit Number 1    Number of Visits 12    Date for PT Re-Evaluation 07/09/22    Authorization Type [employer]    PT Start Time [phone]    PT Stop Time 0930    PT Time Calculation (min) 40 min    Activity Tolerance Patient tolerated treatment well    Behavior During Therapy [facility] for tasks assessed/performed             Past Medical History:  Diagnosis Date   Abnormal findings on diagnostic imaging of lung 09/23/2019   09/22/2019-lung cancer screening-centrilobular and paraseptal emphysema, calcified granulomata noted bilaterally, no suspicious nodules, lung RADS 1, repeat in 12 months    Anxiety    Atrial fibrillation ([place]) 09/23/2019   Centrilobular emphysema ([place]) 09/23/2019   09/22/2019-lung cancer screening-centrilobular and paraseptal emphysema, calcified granulomata noted bilaterally, no suspicious nodules, lung RADS 1, repeat in 12 months  09/23/2019-FVC 3.74 (81% predicted), postbronchodilator ratio 80, postbronchodilator FEV1 2.88 (86% predicted), no bronchodilator response, DLCO 19.44 (73% predicted)   DDD (degenerative disc disease), cervical 05/13/2008   Cervical Disc Degeneration   Depression    History of adenomatous polyp of colon 03/03/2018   05/14/17: Colonoscopy: nonadvanced adenoma, f/u 5 yrs, use SuPrep, Murphy/GAP  Last Assessment & Plan:  Relevant Hx: Course: Daily Update: Today's Plan:   Hyperlipidemia    Kidney stone    Nephrolithiasis 05/22/2011   Nontraumatic incomplete tear of right rotator cuff 10/14/2018   Other and unspecified hyperlipidemia 11/20/2009   Hyperlipidemia   RBBB 06/09/2012   Secondary hypercoagulable state ([place]) 09/28/2019   Shortness of breath 09/23/2019   Past Surgical History:  Procedure Laterality Date   CARDIOVERSION N/A  10/15/2019   Procedure: CARDIOVERSION;  Surgeon: Jerline Pain, MD;  Location[facility] ENDOSCOPY;  Service: Cardiovascular;  Laterality: N/A;   CARDIOVERSION N/A 11/29/2019   Procedure: CARDIOVERSION;  Surgeon: Dorothy Spark, MD;  Location: [facility] ENDOSCOPY;  Service: Cardiovascular;  Laterality: N/A;   [place] Right 12/21/2021   Procedure: TOTAL HIP ARTHROPLASTY ANTERIOR APPROACH;  Surgeon: Dorna Leitz, MD;  Location: [facility] ORS;  Service: Orthopedics;  Laterality: Right;   VARICOSE VEIN SURGERY Right    Patient Active Problem List   Diagnosis Date Noted   COVID-19 virus infection 03/04/2022   Acute bronchitis 03/04/2022   OSA (obstructive sleep apnea) 03/04/2022   PAF (paroxysmal atrial fibrillation) ([place]) 12/13/2021   Primary osteoarthritis of right hip 10/26/2021   Abnormal liver function tests 09/10/2021   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   Urinary hesitancy 03/08/2021   Thoracic aortic aneurysm without rupture ([place]) 03/08/2021   Kidney stone    Senile purpura ([place]) 09/07/2020   Aortic atherosclerosis ([place]) 09/07/2020   Sinus bradycardia 03/31/2020   Essential hypertension 03/31/2020   Major depressive disorder 03/07/2020   Essential tremor 03/07/2020   Tremor 01/21/2020   Obesity (BMI 30-39.9) 12/10/2019   Chest pain of uncertain etiology 17/00/1749   Secondary hypercoagulable state ([place]) 09/28/2019   Centrilobular emphysema ([place]) 09/23/2019   Former smoker 09/23/2019   Abnormal findings on diagnostic imaging of lung 09/23/2019   Atrial fibrillation ([place]) 09/23/2019   At risk for sleep apnea 09/23/2019   Shortness of breath 09/23/2019   Nontraumatic incomplete tear of right rotator cuff 10/14/2018  History of adenomatous polyp of colon 03/03/2018   RBBB 06/09/2012   Nephrolithiasis 05/22/2011   Mixed hyperlipidemia 11/20/2009   DDD (degenerative disc disease), cervical 05/13/2008    PCP: [name]  REFERRING PROVIDER: Gregor Hams,  MD  REFERRING DIAG: [phone] (ICD-10-CM) - Acute pain of left shoulder M54.2 (ICD-10-CM) - Neck pain  THERAPY DIAG:  Acute pain of left shoulder - Plan: PT plan of care cert/re-cert  Abnormal posture  Muscle weakness (generalized)  Rationale for Evaluation and Treatment Rehabilitation  ONSET DATE: ~1 month ago  SUBJECTIVE:                                                                                                                                                                                      SUBJECTIVE STATEMENT: Pt reports L shoulder pain x 1 month ago. Saw Dr. [name] and did x-rays. Pt has been taking gabapentin for pain. Pt first noticed it when trying to perform a pull up or push up. Pain was going up to his neck.   PERTINENT HISTORY: No prior shoulder injuries R THA R RTC repair  PAIN:  Are you having pain? Yes: NPRS scale: 8 at rest/10 Pain location: Radiates top of shoulder to elbow Pain description: Throbbing, aching Aggravating factors: Increased movement Relieving factors: Changing positions, gabapentin  PRECAUTIONS: None  WEIGHT BEARING RESTRICTIONS No  FALLS:  Has patient fallen in last 6 months? No  LIVING ENVIRONMENT: Lives with: lives with their family and lives with their spouse Lives in: House/apartment  OCCUPATION: Retired  PLOF: Independent  PATIENT GOALS  Improve pain and shoulder movement  OBJECTIVE:   DIAGNOSTIC FINDINGS:  X-ray  PATIENT SURVEYS:  FOTO 41; predicted 75 at visit  12  COGNITION:  Overall cognitive status: Within functional limits for tasks assessed     SENSATION: WFL  POSTURE: Forward head posture, thoracic kyphosis  UPPER EXTREMITY ROM: PROM is WFL -- only limited due to pain  Active ROM Right eval Left eval  Shoulder flexion 150 150 full ROM, >90 deg painful  Shoulder extension 80 60  Shoulder abduction 160 124 full ROM, >85 deg painful  Shoulder adduction    Shoulder internal rotation     Shoulder external rotation 55 45  Elbow flexion    Elbow extension    Wrist flexion    Wrist extension    Wrist ulnar deviation    Wrist radial deviation    Wrist pronation    Wrist supination    (Blank rows = not tested)  UPPER EXTREMITY MMT:  MMT Right eval Left eval  Shoulder flexion  4+/5  Shoulder extension  5/5  Shoulder abduction  3+/5  Shoulder adduction    Shoulder internal rotation  3+/5  Shoulder external rotation  3+/5  Middle trapezius  3+/5  Lower trapezius    Elbow flexion    Elbow extension    Wrist flexion    Wrist extension    Wrist ulnar deviation    Wrist radial deviation    Wrist pronation    Wrist supination    Grip strength (lbs)    (Blank rows = not tested)  SHOULDER SPECIAL TESTS:  Impingement tests: Hawkins/Kennedy impingement test: positive , Painful arc test: positive , and O' Briens test: positive   SLAP lesions: Biceps load test: positive   Instability tests: Apprehension test: positive   Rotator cuff assessment: Empty can test: positive   Biceps assessment: Speed's test: positive   JOINT MOBILITY TESTING:  Limited due to pt guarding/pain; however, GH hypermobile inferiorly with increased pain. Hypomobile thoracic spine   PALPATION:  TTP along bicep, UT, levators, cervical paraspinals   TODAY'S TREATMENT:  05/28/22 See HEP   PATIENT EDUCATION: Education details: Exam findings, POC, resting/relaxing shoulder Person educated: Patient Education method: Explanation, Demonstration, and Handouts Education comprehension: verbalized understanding, returned demonstration, and needs further education   HOME EXERCISE PROGRAM: Access Code: [email or web address] URL: [email or web address] Date: 05/28/2022 Prepared by: Estill Bamberg April Thurnell Garbe  Exercises - Seated Neck Sidebending Stretch with Pillow   - 1 x daily - 7 x weekly - 2 sets - 30 sec hold - Seated Scapular Retraction  - 1 x daily - 7 x weekly - 1 sets - 10 reps -  Standing Cervical Retraction  - 1 x daily - 7 x weekly - 1 sets - 10 reps  ASSESSMENT:  CLINICAL IMPRESSION: Patient is a 76 y.o. M who was seen today for physical therapy evaluation and treatment for L shoulder and neck pain. On assessment, pt keeps L shoulder highly guarded. Due to this, pt tested (+) for all shoulder special tests; however, s/s appear most consistent to muscle spasm with joint instability causing bicep impingement affecting all UE activities. Pt would benefit from PT to address these issues.    OBJECTIVE IMPAIRMENTS decreased activity tolerance, decreased mobility, decreased ROM, decreased strength, increased fascial restrictions, increased muscle spasms, impaired UE functional use, postural dysfunction, and pain.   ACTIVITY LIMITATIONS carrying, lifting, toileting, dressing, reach over head, and hygiene/grooming  PARTICIPATION LIMITATIONS: cleaning, laundry, community activity, and yard work  PERSONAL FACTORS Age and Past/current experiences are also affecting patient's functional outcome.   REHAB POTENTIAL: Good  CLINICAL DECISION MAKING: Evolving/moderate complexity  EVALUATION COMPLEXITY: Moderate   GOALS: Goals reviewed with patient? Yes  SHORT TERM GOALS: Target date: 06/18/2022   Pt will be independent with initial HEP Baseline: Not provided yet Goal status: INITIAL  2.  Pt will report improve resting shoulder pain by at least 50% Baseline: 8/10 Goal status: INITIAL  3.  Pt will be able to tolerate pain free scapular and neck retraction for improved postural stability Baseline: Increased pain Goal status: INITIAL    LONG TERM GOALS: Target date: 07/09/2022   Pt will be independent with advanced HEP Baseline:  Goal status: INITIAL  2.  Pt will have pain free L=R shoulder ROM Baseline:  Goal status: INITIAL  3.  Pt will be able to return to overhead lifting and pulling without pain for gym activities Baseline:  Goal status: INITIAL  4.  Pt  will demo at least 4+/5 shoulder strength for improved scapular mobility Baseline:  Goal status: INITIAL  5.  Pt will increase FOTO score to >/=63 Baseline: 49 Goal status: INITIAL    PLAN: PT FREQUENCY: 2x/week  PT DURATION: 6 weeks  PLANNED INTERVENTIONS: Therapeutic exercises, Therapeutic activity, Neuromuscular re-education, Balance training, Gait training, Patient/Family education, Self Care, Joint mobilization, Dry Needling, Electrical stimulation, Cryotherapy, Moist heat, Taping, Vasopneumatic device, Ionotophoresis '4mg'$ /ml Dexamethasone, Manual therapy, and Re-evaluation  PLAN FOR NEXT SESSION: Review HEP as needed. Gentle manual through cervical and thoracic spine. Stretch/manual for UT and pecs. Work on gentle shoulder isometrics.    Kadisha Goodine April Ma L Sheila Ocasio, PT, DPT 05/28/2022, 10:16 AM

## 2022-05-29 NOTE — Therapy (Signed)
OUTPATIENT PHYSICAL THERAPY TREATMENT NOTE   Patient Name: Joshua [name] [facility] MRN: [id number] DOB:Aug 28, 1946, 76 y.o., male Today's Date: 05/30/2022   PT End of Session - 05/30/22 0843     Visit Number 2    Number of Visits 12    Date for PT Re-Evaluation 07/09/22    Authorization Type [employer]    Progress Note Due on Visit 10    PT Start Time 0845    PT Stop Time 0939    PT Time Calculation (min) 54 min    Activity Tolerance Patient tolerated treatment well    Behavior During Therapy [facility] for tasks assessed/performed              Past Medical History:  Diagnosis Date   Abnormal findings on diagnostic imaging of lung 09/23/2019   09/22/2019-lung cancer screening-centrilobular and paraseptal emphysema, calcified granulomata noted bilaterally, no suspicious nodules, lung RADS 1, repeat in 12 months    Anxiety    Atrial fibrillation ([place]) 09/23/2019   Centrilobular emphysema ([place]) 09/23/2019   09/22/2019-lung cancer screening-centrilobular and paraseptal emphysema, calcified granulomata noted bilaterally, no suspicious nodules, lung RADS 1, repeat in 12 months  09/23/2019-FVC 3.74 (81% predicted), postbronchodilator ratio 80, postbronchodilator FEV1 2.88 (86% predicted), no bronchodilator response, DLCO 19.44 (73% predicted)   DDD (degenerative disc disease), cervical 05/13/2008   Cervical Disc Degeneration   Depression    History of adenomatous polyp of colon 03/03/2018   05/14/17: Colonoscopy: nonadvanced adenoma, f/u 5 yrs, use SuPrep, [place]/GAP  Last Assessment & Plan:  Relevant Hx: Course: Daily Update: Today's Plan:   Hyperlipidemia    Kidney stone    Nephrolithiasis 05/22/2011   Nontraumatic incomplete tear of right rotator cuff 10/14/2018   Other and unspecified hyperlipidemia 11/20/2009   Hyperlipidemia   RBBB 06/09/2012   Secondary hypercoagulable state ([place]) 09/28/2019   Shortness of breath 09/23/2019   Past Surgical History:  Procedure Laterality  Date   CARDIOVERSION N/A 10/15/2019   Procedure: CARDIOVERSION;  Surgeon: Jerline Pain, MD;  Location: [facility] ENDOSCOPY;  Service: Cardiovascular;  Laterality: N/A;   CARDIOVERSION N/A 11/29/2019   Procedure: CARDIOVERSION;  Surgeon: Dorothy Spark, MD;  Location: [facility] ENDOSCOPY;  Service: Cardiovascular;  Laterality: N/A;   [place] Right 12/21/2021   Procedure: TOTAL HIP ARTHROPLASTY ANTERIOR APPROACH;  Surgeon: Dorna Leitz, MD;  Location: [facility] ORS;  Service: Orthopedics;  Laterality: Right;   VARICOSE VEIN SURGERY Right    Patient Active Problem List   Diagnosis Date Noted   COVID-19 virus infection 03/04/2022   Acute bronchitis 03/04/2022   OSA (obstructive sleep apnea) 03/04/2022   PAF (paroxysmal atrial fibrillation) ([place]) 12/13/2021   Primary osteoarthritis of right hip 10/26/2021   Abnormal liver function tests 09/10/2021   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   Urinary hesitancy 03/08/2021   Thoracic aortic aneurysm without rupture ([place]) 03/08/2021   Kidney stone    Senile purpura ([place]) 09/07/2020   Aortic atherosclerosis ([place]) 09/07/2020   Sinus bradycardia 03/31/2020   Essential hypertension 03/31/2020   Major depressive disorder 03/07/2020   Essential tremor 03/07/2020   Tremor 01/21/2020   Obesity (BMI 30-39.9) 12/10/2019   Chest pain of uncertain etiology 95/28/4132   Secondary hypercoagulable state ([place]) 09/28/2019   Centrilobular emphysema ([place]) 09/23/2019   Former smoker 09/23/2019   Abnormal findings on diagnostic imaging of lung 09/23/2019   Atrial fibrillation ([place]) 09/23/2019   At risk for sleep apnea 09/23/2019   Shortness of breath 09/23/2019  Nontraumatic incomplete tear of right rotator cuff 10/14/2018   History of adenomatous polyp of colon 03/03/2018   RBBB 06/09/2012   Nephrolithiasis 05/22/2011   Mixed hyperlipidemia 11/20/2009   DDD (degenerative disc disease), cervical 05/13/2008    PCP: [name]  REFERRING  PROVIDER: Gregor Hams, MD  REFERRING DIAG: [phone] (ICD-10-CM) - Acute pain of left shoulder M54.2 (ICD-10-CM) - Neck pain  THERAPY DIAG:  Acute pain of left shoulder  Abnormal posture  Muscle weakness (generalized)  Rationale for Evaluation and Treatment Rehabilitation  ONSET DATE: ~1 month ago  SUBJECTIVE:                                                                                                                                                                                      SUBJECTIVE STATEMENT: Stretches are helping and the Gabapentin is helping. Didn't take it today so you could see how it's hurting. Moving up into shoulder and giving it a tired feeling.    PERTINENT HISTORY: No prior shoulder injuries R THA R RTC repair  PAIN:  Are you having pain? Yes: NPRS scale: 7-8 at rest/10 Pain location: Radiates top of shoulder to elbow Pain description: Throbbing, aching Aggravating factors: Increased movement Relieving factors: Changing positions, gabapentin  PRECAUTIONS: None   LIVING ENVIRONMENT: Lives with: lives with their family and lives with their spouse Lives in: House/apartment  OCCUPATION: Retired  PLOF: Independent  PATIENT GOALS  Improve pain and shoulder movement  OBJECTIVE:   DIAGNOSTIC FINDINGS:  X-ray  PATIENT SURVEYS:  FOTO 41; predicted 73 at visit  12  COGNITION:  Overall cognitive status: Within functional limits for tasks assessed     SENSATION: WFL  POSTURE: Forward head posture, thoracic kyphosis  UPPER EXTREMITY ROM: PROM is WFL -- only limited due to pain  Active ROM Right eval Left eval  Shoulder flexion 150 150 full ROM, >90 deg painful  Shoulder extension 80 60  Shoulder abduction 160 124 full ROM, >85 deg painful  Shoulder adduction    Shoulder internal rotation    Shoulder external rotation 55 45  (Blank rows = not tested)  UPPER EXTREMITY MMT:  MMT Right eval Left eval  Shoulder flexion  4+/5   Shoulder extension  5/5  Shoulder abduction  3+/5  Shoulder adduction    Shoulder internal rotation  3+/5  Shoulder external rotation  3+/5  Middle trapezius  3+/5  Lower trapezius    Elbow flexion    Elbow extension    (Blank rows = not tested)  SHOULDER SPECIAL TESTS:  Impingement tests: Hawkins/Kennedy impingement test: positive , Painful arc test: positive , and O' Briens test: positive  SLAP lesions: Biceps load test: positive   Instability tests: Apprehension test: positive   Rotator cuff assessment: Empty can test: positive   Biceps assessment: Speed's test: positive   JOINT MOBILITY TESTING:  Limited due to pt guarding/pain; however, GH hypermobile inferiorly with increased pain. Hypomobile thoracic spine   PALPATION:  TTP along bicep, UT, levators, cervical paraspinals   TODAY'S TREATMENT:  05/30/22 Standing scapular retraction x 10 Chin tuck against wall x 10 3 sec hold Seated UT stretch 2x 30 sec bil Seated cervical rotation bil 5 x 5 sec hold Seated active shrug and active depression of shoulders x 10 (reports giddiness)  Manual Therapy: Skilled palpation and monitoring of soft tissues during DN.  STM to left UT, deltoids, pectorals and scapular muscles  Trigger Point Dry-Needling  Treatment instructions: Expect mild to moderate muscle soreness. S/S of pneumothorax if dry needled over a lung field, and to seek immediate medical attention should they occur. Patient verbalized understanding of these instructions and education.  Patient Consent Given: Yes Education handout provided: Previously provided Muscles treated: Left UT, infraspinatus, lats/teres, deltoids, pectorals, biceps Electrical stimulation performed: No Parameters: N/A Treatment response/outcome: Twitch Response Elicited and Palpable Increase in Muscle Length  Estim: IFC to left shoulder with heat x 10 min    05/28/22 See HEP    PATIENT EDUCATION: Education details: HEP update  05/30/22 Person educated: Patient Education method: Explanation, Demonstration, and Handouts Education comprehension: verbalized understanding, returned demonstration, and needs further education   HOME EXERCISE PROGRAM: Access Code: [email or web address]  ASSESSMENT:  CLINICAL IMPRESSION: Zyheir was able to tolerate more exercises today although he was still limited by pain, especially when shoulder isometrics were attempted. Initial trial of DN/MT with excellent responses. Estim/heat administered for pain control.       OBJECTIVE IMPAIRMENTS decreased activity tolerance, decreased mobility, decreased ROM, decreased strength, increased fascial restrictions, increased muscle spasms, impaired UE functional use, postural dysfunction, and pain.   ACTIVITY LIMITATIONS carrying, lifting, toileting, dressing, reach over head, and hygiene/grooming  PARTICIPATION LIMITATIONS: cleaning, laundry, community activity, and yard work  PERSONAL FACTORS Age and Past/current experiences are also affecting patient's functional outcome.   REHAB POTENTIAL: Good  CLINICAL DECISION MAKING: Evolving/moderate complexity  EVALUATION COMPLEXITY: Moderate   GOALS: Goals reviewed with patient? Yes  SHORT TERM GOALS: Target date: 06/18/2022   Pt will be independent with initial HEP Baseline: Not provided yet Goal status: INITIAL  2.  Pt will report improve resting shoulder pain by at least 50% Baseline: 8/10 Goal status: INITIAL  3.  Pt will be able to tolerate pain free scapular and neck retraction for improved postural stability Baseline: Increased pain Goal status: INITIAL    LONG TERM GOALS: Target date: 07/09/2022   Pt will be independent with advanced HEP Baseline:  Goal status: INITIAL  2.  Pt will have pain free L=R shoulder ROM Baseline:  Goal status: INITIAL  3.  Pt will be able to return to overhead lifting and pulling without pain for gym activities Baseline:  Goal status: INITIAL  4.   Pt will demo at least 4+/5 shoulder strength for improved scapular mobility Baseline:  Goal status: INITIAL  5.  Pt will increase FOTO score to >/=63 Baseline: 49 Goal status: INITIAL    PLAN: PT FREQUENCY: 2x/week  PT DURATION: 6 weeks  PLANNED INTERVENTIONS: Therapeutic exercises, Therapeutic activity, Neuromuscular re-education, Balance training, Gait training, Patient/Family education, Self Care, Joint mobilization, Dry Needling, Electrical stimulation, Cryotherapy, Moist heat, Taping, Vasopneumatic device, Ionotophoresis '4mg'$ /ml Dexamethasone,  Manual therapy, and Re-evaluation  PLAN FOR NEXT SESSION:  Review and update HEP as needed. Assess DN. Gentle manual through cervical and thoracic spine. Stretch/manual for UT and pecs. Work on gentle shoulder isometrics.    Emoni Whitworth, PT,  05/30/2022, 2:36 PM

## 2022-05-30 ENCOUNTER — Encounter: Payer: Self-pay | Admitting: Physical Therapy

## 2022-05-30 ENCOUNTER — Ambulatory Visit: Payer: Medicare HMO | Admitting: Physical Therapy

## 2022-05-30 DIAGNOSIS — M6281 Muscle weakness (generalized): Secondary | ICD-10-CM

## 2022-05-30 DIAGNOSIS — M25512 Pain in left shoulder: Secondary | ICD-10-CM

## 2022-05-30 DIAGNOSIS — R293 Abnormal posture: Secondary | ICD-10-CM

## 2022-05-30 DIAGNOSIS — M542 Cervicalgia: Secondary | ICD-10-CM | POA: Diagnosis not present

## 2022-05-31 ENCOUNTER — Encounter: Payer: Self-pay | Admitting: Family Medicine

## 2022-05-31 DIAGNOSIS — M539 Dorsopathy, unspecified: Secondary | ICD-10-CM | POA: Insufficient documentation

## 2022-05-31 DIAGNOSIS — F1010 Alcohol abuse, uncomplicated: Secondary | ICD-10-CM | POA: Insufficient documentation

## 2022-06-03 NOTE — Therapy (Signed)
OUTPATIENT PHYSICAL THERAPY TREATMENT NOTE   Patient Name: Joshua [name] [facility] MRN: [id number] DOB:1946-07-14, 76 y.o., male Today's Date: 06/04/2022   PT End of Session - 06/04/22 1240     Visit Number 3    Number of Visits 12    Date for PT Re-Evaluation 07/09/22    Authorization Type [employer]    Progress Note Due on Visit 10    PT Start Time 1148    PT Stop Time 1243    PT Time Calculation (min) 55 min    Activity Tolerance Patient tolerated treatment well    Behavior During Therapy WFL for tasks assessed/performed               Past Medical History:  Diagnosis Date   Abnormal findings on diagnostic imaging of lung 09/23/2019   09/22/2019-lung cancer screening-centrilobular and paraseptal emphysema, calcified granulomata noted bilaterally, no suspicious nodules, lung RADS 1, repeat in 12 months    Anxiety    Atrial fibrillation ([place]) 09/23/2019   Centrilobular emphysema ([place]) 09/23/2019   09/22/2019-lung cancer screening-centrilobular and paraseptal emphysema, calcified granulomata noted bilaterally, no suspicious nodules, lung RADS 1, repeat in 12 months  09/23/2019-FVC 3.74 (81% predicted), postbronchodilator ratio 80, postbronchodilator FEV1 2.88 (86% predicted), no bronchodilator response, DLCO 19.44 (73% predicted)   DDD (degenerative disc disease), cervical 05/13/2008   Cervical Disc Degeneration   Depression    History of adenomatous polyp of colon 03/03/2018   05/14/17: Colonoscopy: nonadvanced adenoma, f/u 5 yrs, use SuPrep, Murphy/GAP  Last Assessment & Plan:  Relevant Hx: Course: Daily Update: Today's Plan:   Hyperlipidemia    Kidney stone    Nephrolithiasis 05/22/2011   Nontraumatic incomplete tear of right rotator cuff 10/14/2018   Other and unspecified hyperlipidemia 11/20/2009   Hyperlipidemia   RBBB 06/09/2012   Secondary hypercoagulable state ([place]) 09/28/2019   Shortness of breath 09/23/2019   Past Surgical History:  Procedure Laterality  Date   CARDIOVERSION N/A 10/15/2019   Procedure: CARDIOVERSION;  Surgeon: Jerline Pain, MD;  Location: [facility] ENDOSCOPY;  Service: Cardiovascular;  Laterality: N/A;   CARDIOVERSION N/A 11/29/2019   Procedure: CARDIOVERSION;  Surgeon: Dorothy Spark, MD;  Location: [facility] ENDOSCOPY;  Service: Cardiovascular;  Laterality: N/A;   [place] Right 12/21/2021   Procedure: TOTAL HIP ARTHROPLASTY ANTERIOR APPROACH;  Surgeon: Dorna Leitz, MD;  Location: [facility] ORS;  Service: Orthopedics;  Laterality: Right;   VARICOSE VEIN SURGERY Right    Patient Active Problem List   Diagnosis Date Noted   Alcohol abuse 05/31/2022   Disorder of thoracic spine 05/31/2022   COVID-19 virus infection 03/04/2022   Acute bronchitis 03/04/2022   OSA (obstructive sleep apnea) 03/04/2022   PAF (paroxysmal atrial fibrillation) ([place]) 12/13/2021   Anxiety 11/08/2021   Aphasia 11/08/2021   Bipolar 1 disorder ([place]) 11/08/2021   CKD (chronic kidney disease), stage III ([place]) 11/08/2021   Expressive aphasia 11/08/2021   Prolonged QT interval 11/08/2021   Recurrent falls 11/08/2021   Primary osteoarthritis of right hip 10/26/2021   Abnormal liver function tests 09/10/2021   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   Urinary hesitancy 03/08/2021   Thoracic aortic aneurysm without rupture ([place]) 03/08/2021   Senile purpura ([place]) 09/07/2020   Aortic atherosclerosis ([place]) 09/07/2020   Sinus bradycardia 03/31/2020   Essential hypertension 03/31/2020   Major depressive disorder 03/07/2020   Essential tremor 03/07/2020   Tremor 01/21/2020   Obesity (BMI 30-39.9) 12/10/2019   Chest pain of uncertain etiology  12/10/2019   Secondary hypercoagulable state ([place]) 09/28/2019   Centrilobular emphysema ([place]) 09/23/2019   Former smoker 09/23/2019   Abnormal findings on diagnostic imaging of lung 09/23/2019   Atrial fibrillation ([place]) 09/23/2019   At risk for sleep apnea 09/23/2019   Shortness of breath 09/23/2019    Nontraumatic incomplete tear of right rotator cuff 10/14/2018   History of adenomatous polyp of colon 03/03/2018   RBBB 06/09/2012   Nephrolithiasis 05/22/2011   Mixed hyperlipidemia 11/20/2009   DDD (degenerative disc disease), cervical 05/13/2008    PCP: [name]  REFERRING PROVIDER: Gregor Hams, MD  REFERRING DIAG: [phone] (ICD-10-CM) - Acute pain of left shoulder M54.2 (ICD-10-CM) - Neck pain  THERAPY DIAG:  Acute pain of left shoulder  Abnormal posture  Muscle weakness (generalized)  Difficulty in walking, not elsewhere classified  Other abnormalities of gait and mobility  Stiffness of right hip, not elsewhere classified  Pain in right hip  Rationale for Evaluation and Treatment Rehabilitation  ONSET DATE: ~1 month ago  SUBJECTIVE:                                                                                                                                                                                      SUBJECTIVE STATEMENT: The DN helped for a few days and I was able to do my exercises. He was able to lift boxes in the garage and work on the wheels in my car without difficulty. But then after two days started to hurt again.    PERTINENT HISTORY: No prior shoulder injuries R THA R RTC repair  PAIN:  Are you having pain?   Yes: NPRS scale: 7-8 at rest/10 Pain location: Radiates top of shoulder to elbow Pain description: Throbbing, aching Aggravating factors: Increased movement Relieving factors: Changing positions, gabapentin  PRECAUTIONS: None   LIVING ENVIRONMENT: Lives with: lives with their family and lives with their spouse Lives in: House/apartment  OCCUPATION: Retired  PLOF: Independent  PATIENT GOALS  Improve pain and shoulder movement  OBJECTIVE:   DIAGNOSTIC FINDINGS:  X-ray  PATIENT SURVEYS:  FOTO 41; predicted 53 at visit  12  COGNITION:  Overall cognitive status: Within functional limits for tasks  assessed     SENSATION: WFL  POSTURE: Forward head posture, thoracic kyphosis  UPPER EXTREMITY ROM: PROM is WFL -- only limited due to pain  Active ROM Right eval Left eval   Shoulder flexion 150 150 full ROM, >90 deg painful   Shoulder extension 80 60   Shoulder abduction 160 124 full ROM, >85 deg painful   Shoulder adduction     Shoulder internal rotation     Shoulder external  rotation 55 45   (Blank rows = not tested)  UPPER EXTREMITY MMT:  MMT Right eval Left eval  Shoulder flexion  4+/5  Shoulder extension  5/5  Shoulder abduction  3+/5  Shoulder adduction    Shoulder internal rotation  3+/5  Shoulder external rotation  3+/5  Middle trapezius  3+/5  Lower trapezius    Elbow flexion    Elbow extension    (Blank rows = not tested)  SHOULDER SPECIAL TESTS:  Impingement tests: Hawkins/Kennedy impingement test: positive , Painful arc test: positive , and O' Briens test: positive   SLAP lesions: Biceps load test: positive   Instability tests: Apprehension test: positive   Rotator cuff assessment: Empty can test: positive   Biceps assessment: Speed's test: positive   JOINT MOBILITY TESTING:  Limited due to pt guarding/pain; however, GH hypermobile inferiorly with increased pain. Hypomobile thoracic spine   PALPATION:  TTP along bicep, UT, levators, cervical paraspinals   TODAY'S TREATMENT:  06/04/22 Isometrics: L shoulder flex lightly, IR, ER, ext, ABD 5 x 5 sec ea Standing with noodle: scapular retraction with thumbs upward/elbows at 90 deg x 10, ER painful, W x 10 painful after  Manual: Skilled palpation and monitoring of soft tissues during DN.  STM to left pectorals, deltoids, triceps and scapular muscles  Trigger Point Dry-Needling  Treatment instructions: Expect mild to moderate muscle soreness. S/S of pneumothorax if dry needled over a lung field, and to seek immediate medical attention should they occur. Patient verbalized understanding of these  instructions and education.  Patient Consent Given: Yes Education handout provided: Previously provided Muscles treated: Left UT, infraspinatus, triceps, deltoids, pectorals, subscapularis Electrical stimulation performed: No Parameters: N/A Treatment response/outcome: Twitch Response Elicited and Palpable Increase in Muscle Length  Modalities:  MH x 10 min to left shoulder  05/30/22 Standing scapular retraction x 10 Chin tuck against wall x 10 3 sec hold Seated UT stretch 2x 30 sec bil Seated cervical rotation bil 5 x 5 sec hold Seated active shrug and active depression of shoulders x 10 (reports giddiness)  Manual Therapy: Skilled palpation and monitoring of soft tissues during DN.  STM to left UT, deltoids, pectorals, scapular muscles and left paraspinals.  Trigger Point Dry-Needling  Treatment instructions: Expect mild to moderate muscle soreness. S/S of pneumothorax if dry needled over a lung field, and to seek immediate medical attention should they occur. Patient verbalized understanding of these instructions and education.  Patient Consent Given: Yes Education handout provided: Previously provided Muscles treated: Left UT, infraspinatus, lats/teres, deltoids, pectorals, biceps Electrical stimulation performed: No Parameters: N/A Treatment response/outcome: Twitch Response Elicited and Palpable Increase in Muscle Length  Estim: IFC to left shoulder with heat x 10 min    05/28/22 See HEP    PATIENT EDUCATION: Education details: HEP update 8/1//23 Person educated: Patient Education method: Explanation, Demonstration, and Handouts Education comprehension: verbalized understanding, returned demonstration, and needs further education   HOME EXERCISE PROGRAM: Access Code: [email or web address]  ASSESSMENT:  CLINICAL IMPRESSION: Divine reports relief x 2 days after last session then pain gradually returned. He has relief today in supine with hand behind head. Still experiences  pain with active flexion at 90 deg and above. Isometrics were tolerated with gentle pressure and issued for HEP. Decreased tissue tension overall today but still good responses with DN/MT with twitch responses. Very tight in left cervical paraspinals and pt would benefit from DN here as well.      OBJECTIVE IMPAIRMENTS decreased activity tolerance, decreased mobility,  decreased ROM, decreased strength, increased fascial restrictions, increased muscle spasms, impaired UE functional use, postural dysfunction, and pain.   ACTIVITY LIMITATIONS carrying, lifting, toileting, dressing, reach over head, and hygiene/grooming  PARTICIPATION LIMITATIONS: cleaning, laundry, community activity, and yard work  PERSONAL FACTORS Age and Past/current experiences are also affecting patient's functional outcome.   REHAB POTENTIAL: Good  CLINICAL DECISION MAKING: Evolving/moderate complexity  EVALUATION COMPLEXITY: Moderate   GOALS: Goals reviewed with patient? Yes  SHORT TERM GOALS: Target date: 06/18/2022   Pt will be independent with initial HEP Baseline: Not provided yet Goal status: INITIAL  2.  Pt will report improve resting shoulder pain by at least 50% Baseline: 8/10 Goal status: INITIAL  3.  Pt will be able to tolerate pain free scapular and neck retraction for improved postural stability Baseline: Increased pain Goal status: INITIAL    LONG TERM GOALS: Target date: 07/09/2022   Pt will be independent with advanced HEP Baseline:  Goal status: INITIAL  2.  Pt will have pain free L=R shoulder ROM Baseline:  Goal status: INITIAL  3.  Pt will be able to return to overhead lifting and pulling without pain for gym activities Baseline:  Goal status: INITIAL  4.  Pt will demo at least 4+/5 shoulder strength for improved scapular mobility Baseline:  Goal status: INITIAL  5.  Pt will increase FOTO score to >/=63 Baseline: 49 Goal status: INITIAL    PLAN: PT FREQUENCY:  2x/week  PT DURATION: 6 weeks  PLANNED INTERVENTIONS: Therapeutic exercises, Therapeutic activity, Neuromuscular re-education, Balance training, Gait training, Patient/Family education, Self Care, Joint mobilization, Dry Needling, Electrical stimulation, Cryotherapy, Moist heat, Taping, Vasopneumatic device, Ionotophoresis '4mg'$ /ml Dexamethasone, Manual therapy, and Re-evaluation  PLAN FOR NEXT SESSION:  Assess DN. Progress HEP as tolerated.  Gentle manual through cervical and thoracic spine. Stretch/manual for UT and pecs. Work on gentle shoulder isometrics.    Gussie Murton, PT,  06/04/2022, 12:41 PM

## 2022-06-04 ENCOUNTER — Encounter: Payer: Self-pay | Admitting: Physical Therapy

## 2022-06-04 ENCOUNTER — Ambulatory Visit: Payer: Medicare HMO | Attending: Family Medicine | Admitting: Physical Therapy

## 2022-06-04 DIAGNOSIS — M25651 Stiffness of right hip, not elsewhere classified: Secondary | ICD-10-CM | POA: Diagnosis not present

## 2022-06-04 DIAGNOSIS — R262 Difficulty in walking, not elsewhere classified: Secondary | ICD-10-CM | POA: Diagnosis not present

## 2022-06-04 DIAGNOSIS — R2689 Other abnormalities of gait and mobility: Secondary | ICD-10-CM | POA: Insufficient documentation

## 2022-06-04 DIAGNOSIS — M25512 Pain in left shoulder: Secondary | ICD-10-CM | POA: Diagnosis not present

## 2022-06-04 DIAGNOSIS — R293 Abnormal posture: Secondary | ICD-10-CM | POA: Insufficient documentation

## 2022-06-04 DIAGNOSIS — M6281 Muscle weakness (generalized): Secondary | ICD-10-CM | POA: Diagnosis not present

## 2022-06-04 DIAGNOSIS — M25551 Pain in right hip: Secondary | ICD-10-CM | POA: Diagnosis not present

## 2022-06-06 ENCOUNTER — Encounter: Payer: Self-pay | Admitting: Physical Therapy

## 2022-06-06 ENCOUNTER — Ambulatory Visit: Payer: Medicare HMO | Admitting: Physical Therapy

## 2022-06-06 DIAGNOSIS — M25551 Pain in right hip: Secondary | ICD-10-CM | POA: Diagnosis not present

## 2022-06-06 DIAGNOSIS — M6281 Muscle weakness (generalized): Secondary | ICD-10-CM | POA: Diagnosis not present

## 2022-06-06 DIAGNOSIS — M25651 Stiffness of right hip, not elsewhere classified: Secondary | ICD-10-CM | POA: Diagnosis not present

## 2022-06-06 DIAGNOSIS — M25512 Pain in left shoulder: Secondary | ICD-10-CM

## 2022-06-06 DIAGNOSIS — R2689 Other abnormalities of gait and mobility: Secondary | ICD-10-CM | POA: Diagnosis not present

## 2022-06-06 DIAGNOSIS — R293 Abnormal posture: Secondary | ICD-10-CM

## 2022-06-06 DIAGNOSIS — R262 Difficulty in walking, not elsewhere classified: Secondary | ICD-10-CM | POA: Diagnosis not present

## 2022-06-06 NOTE — Therapy (Signed)
OUTPATIENT PHYSICAL THERAPY TREATMENT NOTE   Patient Name: Joshua [name] III MRN: [id number] DOB:[birth year], 76 y.o., male Today's Date: 06/06/2022   PT End of Session - 06/06/22 1145     Visit Number 4    Number of Visits 12    Date for PT Re-Evaluation 07/09/22    Authorization Type [employer]    Progress Note Due on Visit 10    PT Start Time 1145    PT Stop Time 1230    PT Time Calculation (min) 45 min    Activity Tolerance Patient tolerated treatment well    Behavior During Therapy [facility] for tasks assessed/performed               Past Medical History:  Diagnosis Date   Abnormal findings on diagnostic imaging of lung 09/23/2019   09/22/2019-lung cancer screening-centrilobular and paraseptal emphysema, calcified granulomata noted bilaterally, no suspicious nodules, lung RADS 1, repeat in 12 months    Anxiety    Atrial fibrillation ([place]) 09/23/2019   Centrilobular emphysema ([place]) 09/23/2019   09/22/2019-lung cancer screening-centrilobular and paraseptal emphysema, calcified granulomata noted bilaterally, no suspicious nodules, lung RADS 1, repeat in 12 months  09/23/2019-FVC 3.74 (81% predicted), postbronchodilator ratio 80, postbronchodilator FEV1 2.88 (86% predicted), no bronchodilator response, DLCO 19.44 (73% predicted)   DDD (degenerative disc disease), cervical 05/13/2008   Cervical Disc Degeneration   Depression    History of adenomatous polyp of colon 03/03/2018   05/14/17: Colonoscopy: nonadvanced adenoma, f/u 5 yrs, use SuPrep, [place]/GAP  Last Assessment & Plan:  Relevant Hx: Course: Daily Update: Today's Plan:   Hyperlipidemia    Kidney stone    Nephrolithiasis 05/22/2011   Nontraumatic incomplete tear of right rotator cuff 10/14/2018   Other and unspecified hyperlipidemia 11/20/2009   Hyperlipidemia   RBBB 06/09/2012   Secondary hypercoagulable state ([place]) 09/28/2019   Shortness of breath 09/23/2019   Past Surgical History:  Procedure Laterality  Date   CARDIOVERSION N/A 10/15/2019   Procedure: CARDIOVERSION;  Surgeon: Jerline Pain, MD;  Location: [facility] ENDOSCOPY;  Service: Cardiovascular;  Laterality: N/A;   CARDIOVERSION N/A 11/29/2019   Procedure: CARDIOVERSION;  Surgeon: Dorothy Spark, MD;  Location: [facility] ENDOSCOPY;  Service: Cardiovascular;  Laterality: N/A;   [place] Right 12/21/2021   Procedure: TOTAL HIP ARTHROPLASTY ANTERIOR APPROACH;  Surgeon: Dorna Leitz, MD;  Location: [facility] ORS;  Service: Orthopedics;  Laterality: Right;   VARICOSE VEIN SURGERY Right    Patient Active Problem List   Diagnosis Date Noted   Alcohol abuse 05/31/2022   Disorder of thoracic spine 05/31/2022   COVID-19 virus infection 03/04/2022   Acute bronchitis 03/04/2022   OSA (obstructive sleep apnea) 03/04/2022   PAF (paroxysmal atrial fibrillation) ([place]) 12/13/2021   Anxiety 11/08/2021   Aphasia 11/08/2021   Bipolar 1 disorder ([place]) 11/08/2021   CKD (chronic kidney disease), stage III ([place]) 11/08/2021   Expressive aphasia 11/08/2021   Prolonged QT interval 11/08/2021   Recurrent falls 11/08/2021   Primary osteoarthritis of right hip 10/26/2021   Abnormal liver function tests 09/10/2021   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   Urinary hesitancy 03/08/2021   Thoracic aortic aneurysm without rupture ([place]) 03/08/2021   Senile purpura ([place]) 09/07/2020   Aortic atherosclerosis ([place]) 09/07/2020   Sinus bradycardia 03/31/2020   Essential hypertension 03/31/2020   Major depressive disorder 03/07/2020   Essential tremor 03/07/2020   Tremor 01/21/2020   Obesity (BMI 30-39.9) 12/10/2019   Chest pain of uncertain etiology  12/10/2019   Secondary hypercoagulable state ([place]) 09/28/2019   Centrilobular emphysema ([place]) 09/23/2019   Former smoker 09/23/2019   Abnormal findings on diagnostic imaging of lung 09/23/2019   Atrial fibrillation ([place]) 09/23/2019   At risk for sleep apnea 09/23/2019   Shortness of breath 09/23/2019    Nontraumatic incomplete tear of right rotator cuff 10/14/2018   History of adenomatous polyp of colon 03/03/2018   RBBB 06/09/2012   Nephrolithiasis 05/22/2011   Mixed hyperlipidemia 11/20/2009   DDD (degenerative disc disease), cervical 05/13/2008    PCP: [name]  REFERRING PROVIDER: Gregor Hams, MD  REFERRING DIAG: [phone] (ICD-10-CM) - Acute pain of left shoulder M54.2 (ICD-10-CM) - Neck pain  THERAPY DIAG:  Acute pain of left shoulder  Abnormal posture  Muscle weakness (generalized)  Rationale for Evaluation and Treatment Rehabilitation  ONSET DATE: ~1 month ago  SUBJECTIVE:                                                                                                                                                                                      SUBJECTIVE STATEMENT: Pt states TPDN helps for 2-3 days. Pain is then less intense. Took a gabapentin this morning.     PERTINENT HISTORY: No prior shoulder injuries R THA R RTC repair  PAIN:  Are you having pain?   Yes: NPRS scale: 5 or 6/10 Pain location: Radiates top of shoulder to elbow Pain description: Throbbing, aching Aggravating factors: Increased movement Relieving factors: Changing positions, gabapentin  PRECAUTIONS: None   LIVING ENVIRONMENT: Lives with: lives with their family and lives with their spouse Lives in: House/apartment  OCCUPATION: Retired  PLOF: Independent  PATIENT GOALS  Improve pain and shoulder movement  OBJECTIVE:   DIAGNOSTIC FINDINGS:  X-ray  PATIENT SURVEYS:  FOTO 41; predicted 62 at visit  12  COGNITION:  Overall cognitive status: Within functional limits for tasks assessed     SENSATION: WFL  POSTURE: Forward head posture, thoracic kyphosis  UPPER EXTREMITY ROM: PROM is WFL -- only limited due to pain  Active ROM Right eval Left eval   Shoulder flexion 150 150 full ROM, >90 deg painful   Shoulder extension 80 60   Shoulder abduction 160 124  full ROM, >85 deg painful   Shoulder adduction     Shoulder internal rotation     Shoulder external rotation 55 45   (Blank rows = not tested)  UPPER EXTREMITY MMT:  MMT Right eval Left eval  Shoulder flexion  4+/5  Shoulder extension  5/5  Shoulder abduction  3+/5  Shoulder adduction    Shoulder internal rotation  3+/5  Shoulder external rotation  3+/5  Middle trapezius  3+/5  Lower trapezius    Elbow flexion    Elbow extension    (Blank rows = not tested)  SHOULDER SPECIAL TESTS:  Impingement tests: Hawkins/Kennedy impingement test: positive , Painful arc test: positive , and O' Briens test: positive   SLAP lesions: Biceps load test: positive   Instability tests: Apprehension test: positive   Rotator cuff assessment: Empty can test: positive   Biceps assessment: Speed's test: positive   JOINT MOBILITY TESTING:  Limited due to pt guarding/pain; however, GH hypermobile inferiorly with increased pain. Hypomobile thoracic spine   PALPATION:  TTP along bicep, UT, levators, cervical paraspinals   TODAY'S TREATMENT:  06/06/22 Pulleys for warm up flex & scaption x 2 min each Doorway pec stretch low, mid, high x30 sec each Sidelying:  Shoulder ER 2x10  Scapular retraction 2x10 Seated:  Neck retraction 2x10  Manual: Skilled palpation and monitoring of soft tissues during DN.  STM to left pectorals, deltoids, and scapular muscles Scapular mobilizations in all directions Trigger Point Dry-Needling  Treatment instructions: Expect mild to moderate muscle soreness. S/S of pneumothorax if dry needled over a lung field, and to seek immediate medical attention should they occur. Patient verbalized understanding of these instructions and education.  Patient Consent Given: Yes Education handout provided: Previously provided Muscles treated: Left UT, left cervical paraspinals, infraspinatus, triceps, deltoids, pectorals Electrical stimulation performed: No Parameters:  N/A Treatment response/outcome: Twitch Response Elicited and Palpable Increase in Muscle Length  Modalities:  MH x 10 min to left shoulder   06/04/22 Isometrics: L shoulder flex lightly, IR, ER, ext, ABD 5 x 5 sec ea Standing with noodle: scapular retraction with thumbs upward/elbows at 90 deg x 10, ER painful, W x 10 painful after  Manual: Skilled palpation and monitoring of soft tissues during DN.  STM to left pectorals, deltoids, triceps and scapular muscles  Trigger Point Dry-Needling  Treatment instructions: Expect mild to moderate muscle soreness. S/S of pneumothorax if dry needled over a lung field, and to seek immediate medical attention should they occur. Patient verbalized understanding of these instructions and education.  Patient Consent Given: Yes Education handout provided: Previously provided Muscles treated: Left UT, infraspinatus, triceps, deltoids, pectorals, subscapularis Electrical stimulation performed: No Parameters: N/A Treatment response/outcome: Twitch Response Elicited and Palpable Increase in Muscle Length  Modalities:  MH x 10 min to left shoulder     PATIENT EDUCATION: Education details: HEP update 8/1//23 Person educated: Patient Education method: Explanation, Demonstration, and Handouts Education comprehension: verbalized understanding, returned demonstration, and needs further education   HOME EXERCISE PROGRAM: Access Code: [email or web address]  ASSESSMENT:  CLINICAL IMPRESSION: Continued TPDN to improve muscle imbalances. Initiated TPDN through C-spine this session. Continued to work on improving scapular stability. Tight pec minor especially with lower positions of doorway stretching. Continues to get referred posterior arm pain when performing scapular retraction. Discussed with pt postural alignment and its affect on shoulder reach/elevation. Encouraged pt to continue to work on posture at home - discussed trial of K-tape to provide further stability on  next session.       OBJECTIVE IMPAIRMENTS decreased activity tolerance, decreased mobility, decreased ROM, decreased strength, increased fascial restrictions, increased muscle spasms, impaired UE functional use, postural dysfunction, and pain.   ACTIVITY LIMITATIONS carrying, lifting, toileting, dressing, reach over head, and hygiene/grooming  PARTICIPATION LIMITATIONS: cleaning, laundry, community activity, and yard work  PERSONAL FACTORS Age and Past/current experiences are also affecting patient's functional outcome.   REHAB POTENTIAL: Good  CLINICAL DECISION MAKING:  Evolving/moderate complexity  EVALUATION COMPLEXITY: Moderate   GOALS: Goals reviewed with patient? Yes  SHORT TERM GOALS: Target date: 06/18/2022   Pt will be independent with initial HEP Baseline: Not provided yet Goal status: INITIAL  2.  Pt will report improve resting shoulder pain by at least 50% Baseline: 8/10 Goal status: INITIAL  3.  Pt will be able to tolerate pain free scapular and neck retraction for improved postural stability Baseline: Increased pain Goal status: INITIAL    LONG TERM GOALS: Target date: 07/09/2022   Pt will be independent with advanced HEP Baseline:  Goal status: INITIAL  2.  Pt will have pain free L=R shoulder ROM Baseline:  Goal status: INITIAL  3.  Pt will be able to return to overhead lifting and pulling without pain for gym activities Baseline:  Goal status: INITIAL  4.  Pt will demo at least 4+/5 shoulder strength for improved scapular mobility Baseline:  Goal status: INITIAL  5.  Pt will increase FOTO score to >/=63 Baseline: 49 Goal status: INITIAL    PLAN: PT FREQUENCY: 2x/week  PT DURATION: 6 weeks  PLANNED INTERVENTIONS: Therapeutic exercises, Therapeutic activity, Neuromuscular re-education, Balance training, Gait training, Patient/Family education, Self Care, Joint mobilization, Dry Needling, Electrical stimulation, Cryotherapy, Moist heat,  Taping, Vasopneumatic device, Ionotophoresis '4mg'$ /ml Dexamethasone, Manual therapy, and Re-evaluation  PLAN FOR NEXT SESSION:  K-tape? Assess DN. Progress HEP as tolerated.  Gentle manual through cervical and thoracic spine. Stretch/manual for UT and pecs. Work on gentle shoulder isometrics.    Emylee Decelle April Ma L Jazaria Jarecki, PT,  06/06/2022, 11:46 AM

## 2022-06-11 ENCOUNTER — Ambulatory Visit: Payer: Medicare HMO | Admitting: Physical Therapy

## 2022-06-11 ENCOUNTER — Encounter: Payer: Self-pay | Admitting: Physical Therapy

## 2022-06-11 DIAGNOSIS — M25512 Pain in left shoulder: Secondary | ICD-10-CM | POA: Diagnosis not present

## 2022-06-11 DIAGNOSIS — R293 Abnormal posture: Secondary | ICD-10-CM

## 2022-06-11 DIAGNOSIS — M6281 Muscle weakness (generalized): Secondary | ICD-10-CM

## 2022-06-11 DIAGNOSIS — R262 Difficulty in walking, not elsewhere classified: Secondary | ICD-10-CM | POA: Diagnosis not present

## 2022-06-11 DIAGNOSIS — M25551 Pain in right hip: Secondary | ICD-10-CM | POA: Diagnosis not present

## 2022-06-11 DIAGNOSIS — M25651 Stiffness of right hip, not elsewhere classified: Secondary | ICD-10-CM | POA: Diagnosis not present

## 2022-06-11 DIAGNOSIS — R2689 Other abnormalities of gait and mobility: Secondary | ICD-10-CM | POA: Diagnosis not present

## 2022-06-11 NOTE — Therapy (Signed)
OUTPATIENT PHYSICAL THERAPY TREATMENT NOTE   Patient Name: Joshua [name] MRN: [id number] DOB:1946-06-11, 76 y.o., male Today's Date: 06/11/2022   PT End of Session - 06/11/22 0852     Visit Number 5    Number of Visits 12    Date for PT Re-Evaluation 07/09/22    Authorization Type [employer]    Progress Note Due on Visit 10    PT Start Time [phone]    PT Stop Time 0930    PT Time Calculation (min) 43 min    Activity Tolerance Patient tolerated treatment well    Behavior During Therapy [facility] for tasks assessed/performed               Past Medical History:  Diagnosis Date   Abnormal findings on diagnostic imaging of lung 09/23/2019   09/22/2019-lung cancer screening-centrilobular and paraseptal emphysema, calcified granulomata noted bilaterally, no suspicious nodules, lung RADS 1, repeat in 12 months    Anxiety    Atrial fibrillation ([place]) 09/23/2019   Centrilobular emphysema ([place]) 09/23/2019   09/22/2019-lung cancer screening-centrilobular and paraseptal emphysema, calcified granulomata noted bilaterally, no suspicious nodules, lung RADS 1, repeat in 12 months  09/23/2019-FVC 3.74 (81% predicted), postbronchodilator ratio 80, postbronchodilator FEV1 2.88 (86% predicted), no bronchodilator response, DLCO 19.44 (73% predicted)   DDD (degenerative disc disease), cervical 05/13/2008   Cervical Disc Degeneration   Depression    History of adenomatous polyp of colon 03/03/2018   05/14/17: Colonoscopy: nonadvanced adenoma, f/u 5 yrs, use SuPrep, Murphy/GAP  Last Assessment & Plan:  Relevant Hx: Course: Daily Update: Today's Plan:   Hyperlipidemia    Kidney stone    Nephrolithiasis 05/22/2011   Nontraumatic incomplete tear of right rotator cuff 10/14/2018   Other and unspecified hyperlipidemia 11/20/2009   Hyperlipidemia   RBBB 06/09/2012   Secondary hypercoagulable state ([place]) 09/28/2019   Shortness of breath 09/23/2019   Past Surgical History:  Procedure Laterality  Date   CARDIOVERSION N/A 10/15/2019   Procedure: CARDIOVERSION;  Surgeon: Jerline Pain, MD;  Location: [facility] ENDOSCOPY;  Service: Cardiovascular;  Laterality: N/A;   CARDIOVERSION N/A 11/29/2019   Procedure: CARDIOVERSION;  Surgeon: Dorothy Spark, MD;  Location: [facility] ENDOSCOPY;  Service: Cardiovascular;  Laterality: N/A;   [place] Right 12/21/2021   Procedure: TOTAL HIP ARTHROPLASTY ANTERIOR APPROACH;  Surgeon: Dorna Leitz, MD;  Location: [facility] ORS;  Service: Orthopedics;  Laterality: Right;   VARICOSE VEIN SURGERY Right    Patient Active Problem List   Diagnosis Date Noted   Alcohol abuse 05/31/2022   Disorder of thoracic spine 05/31/2022   COVID-19 virus infection 03/04/2022   Acute bronchitis 03/04/2022   OSA (obstructive sleep apnea) 03/04/2022   PAF (paroxysmal atrial fibrillation) ([place]) 12/13/2021   Anxiety 11/08/2021   Aphasia 11/08/2021   Bipolar 1 disorder ([place]) 11/08/2021   CKD (chronic kidney disease), stage [name] ([place]) 11/08/2021   Expressive aphasia 11/08/2021   Prolonged QT interval 11/08/2021   Recurrent falls 11/08/2021   Primary osteoarthritis of right hip 10/26/2021   Abnormal liver function tests 09/10/2021   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   Urinary hesitancy 03/08/2021   Thoracic aortic aneurysm without rupture ([place]) 03/08/2021   Senile purpura ([place]) 09/07/2020   Aortic atherosclerosis ([place]) 09/07/2020   Sinus bradycardia 03/31/2020   Essential hypertension 03/31/2020   Major depressive disorder 03/07/2020   Essential tremor 03/07/2020   Tremor 01/21/2020   Obesity (BMI 30-39.9) 12/10/2019   Chest pain of uncertain etiology  12/10/2019   Secondary hypercoagulable state ([place]) 09/28/2019   Centrilobular emphysema ([place]) 09/23/2019   Former smoker 09/23/2019   Abnormal findings on diagnostic imaging of lung 09/23/2019   Atrial fibrillation ([place]) 09/23/2019   At risk for sleep apnea 09/23/2019   Shortness of breath 09/23/2019    Nontraumatic incomplete tear of right rotator cuff 10/14/2018   History of adenomatous polyp of colon 03/03/2018   RBBB 06/09/2012   Nephrolithiasis 05/22/2011   Mixed hyperlipidemia 11/20/2009   DDD (degenerative disc disease), cervical 05/13/2008    PCP: [name]  REFERRING PROVIDER: Gregor Hams, MD  REFERRING DIAG: [phone] (ICD-10-CM) - Acute pain of left shoulder M54.2 (ICD-10-CM) - Neck pain  THERAPY DIAG:  Acute pain of left shoulder  Abnormal posture  Muscle weakness (generalized)  Rationale for Evaluation and Treatment Rehabilitation  ONSET DATE: ~1 month ago  SUBJECTIVE:                                                                                                                                                                                      SUBJECTIVE STATEMENT: Pt reports continued pain. Gets some reprieve after TPDN, heat and while doing exercises; however, doesn't seem to last.    PERTINENT HISTORY: No prior shoulder injuries R THA R RTC repair  PAIN:  Are you having pain?   Yes: NPRS scale: 7/10 Pain location: Radiates top of shoulder to elbow Pain description: Throbbing, aching Aggravating factors: Increased movement Relieving factors: Changing positions, gabapentin  PRECAUTIONS: None   LIVING ENVIRONMENT: Lives with: lives with their family and lives with their spouse Lives in: House/apartment  OCCUPATION: Retired  PLOF: Independent  PATIENT GOALS  Improve pain and shoulder movement  OBJECTIVE:   DIAGNOSTIC FINDINGS:  X-ray  PATIENT SURVEYS:  FOTO 41; predicted 65 at visit  12  COGNITION:  Overall cognitive status: Within functional limits for tasks assessed     SENSATION: WFL  POSTURE: Forward head posture, thoracic kyphosis  UPPER EXTREMITY ROM: PROM is WFL -- only limited due to pain  Active ROM Right eval Left eval   Shoulder flexion 150 150 full ROM, >90 deg painful   Shoulder extension 80 60    Shoulder abduction 160 124 full ROM, >85 deg painful   Shoulder adduction     Shoulder internal rotation     Shoulder external rotation 55 45   (Blank rows = not tested)  UPPER EXTREMITY MMT:  MMT Right eval Left eval  Shoulder flexion  4+/5  Shoulder extension  5/5  Shoulder abduction  3+/5  Shoulder adduction    Shoulder internal rotation  3+/5  Shoulder external rotation  3+/5  Middle trapezius  3+/5  Lower trapezius    Elbow flexion    Elbow extension    (Blank rows = not tested)  SHOULDER SPECIAL TESTS:  Impingement tests: Hawkins/Kennedy impingement test: positive , Painful arc test: positive , and O' Briens test: positive   SLAP lesions: Biceps load test: positive   Instability tests: Apprehension test: positive   Rotator cuff assessment: Empty can test: positive   Biceps assessment: Speed's test: positive   JOINT MOBILITY TESTING:  Limited due to pt guarding/pain; however, GH hypermobile inferiorly with increased pain. Hypomobile thoracic spine   PALPATION:  TTP along bicep, UT, levators, cervical paraspinals   TODAY'S TREATMENT:  06/11/22 Pulleys for warm up flex & scaption x 2 min each Seated:  Shoulder retraction 2x10  Shoulder ER 2x10  "W" 2x10 Standing:  Row red tband 2x10 Supine:  Shoulder flexion AAROM 10x3 sec  Shoulder abd AAROM 10x3 sec Manual: Skilled palpation and monitoring of soft tissues during DN.  STM to left UT, deltoids, and scapular muscles K tape for scapular stabilization Trigger Point Dry-Needling  Treatment instructions: Expect mild to moderate muscle soreness. S/S of pneumothorax if dry needled over a lung field, and to seek immediate medical attention should they occur. Patient verbalized understanding of these instructions and education.  Patient Consent Given: Yes Education handout provided: Previously provided Muscles treated: Left UT, left thoracic paraspinals, triceps, deltoids, Teres minor/major, lats Electrical  stimulation performed: No Parameters: N/A Treatment response/outcome: Twitch Response Elicited and Palpable Increase in Muscle Length    06/06/22 Pulleys for warm up flex & scaption x 2 min each Doorway pec stretch low, mid, high x30 sec each Sidelying:  Shoulder ER 2x10  Scapular retraction 2x10 Seated:  Neck retraction 2x10 Manual: Skilled palpation and monitoring of soft tissues during DN.  STM to left pectorals, deltoids, and scapular muscles Scapular mobilizations in all directions Trigger Point Dry-Needling  Treatment instructions: Expect mild to moderate muscle soreness. S/S of pneumothorax if dry needled over a lung field, and to seek immediate medical attention should they occur. Patient verbalized understanding of these instructions and education.  Patient Consent Given: Yes Education handout provided: Previously provided Muscles treated: Left UT, left cervical paraspinals, infraspinatus, triceps, deltoids, pectorals Electrical stimulation performed: No Parameters: N/A Treatment response/outcome: Twitch Response Elicited and Palpable Increase in Muscle Length  Modalities:  MH x 10 min to left shoulder     PATIENT EDUCATION: Education details: HEP update 8/1//23 Person educated: Patient Education method: Explanation, Demonstration, and Handouts Education comprehension: verbalized understanding, returned demonstration, and needs further education   HOME EXERCISE PROGRAM: Access Code: [email or web address]  ASSESSMENT:  CLINICAL IMPRESSION: Continued TPDN to improve muscle imbalances. Initiated TPDN through T-spine this session. Continued to work on improving scapular stability. Provided trial of ktape to further assist with scapular stability. Initiated shoulder AAROM to stretch pt's posterior shoulder. Continued pain with just a few days of reprieve -- pt is not having lasting decrease in pain.       OBJECTIVE IMPAIRMENTS decreased activity tolerance, decreased mobility,  decreased ROM, decreased strength, increased fascial restrictions, increased muscle spasms, impaired UE functional use, postural dysfunction, and pain.   ACTIVITY LIMITATIONS carrying, lifting, toileting, dressing, reach over head, and hygiene/grooming  PARTICIPATION LIMITATIONS: cleaning, laundry, community activity, and yard work  PERSONAL FACTORS Age and Past/current experiences are also affecting patient's functional outcome.   REHAB POTENTIAL: Good  CLINICAL DECISION MAKING: Evolving/moderate complexity  EVALUATION COMPLEXITY: Moderate   GOALS: Goals reviewed with patient? Yes  SHORT TERM GOALS: Target date: 06/18/2022   Pt will be independent with initial HEP Baseline: Not provided yet Goal status: INITIAL  2.  Pt will report improve resting shoulder pain by at least 50% Baseline: 8/10 Goal status: INITIAL  3.  Pt will be able to tolerate pain free scapular and neck retraction for improved postural stability Baseline: Increased pain Goal status: INITIAL    LONG TERM GOALS: Target date: 07/09/2022   Pt will be independent with advanced HEP Baseline:  Goal status: INITIAL  2.  Pt will have pain free L=R shoulder ROM Baseline:  Goal status: INITIAL  3.  Pt will be able to return to overhead lifting and pulling without pain for gym activities Baseline:  Goal status: INITIAL  4.  Pt will demo at least 4+/5 shoulder strength for improved scapular mobility Baseline:  Goal status: INITIAL  5.  Pt will increase FOTO score to >/=63 Baseline: 49 Goal status: INITIAL    PLAN: PT FREQUENCY: 2x/week  PT DURATION: 6 weeks  PLANNED INTERVENTIONS: Therapeutic exercises, Therapeutic activity, Neuromuscular re-education, Balance training, Gait training, Patient/Family education, Self Care, Joint mobilization, Dry Needling, Electrical stimulation, Cryotherapy, Moist heat, Taping, Vasopneumatic device, Ionotophoresis '4mg'$ /ml Dexamethasone, Manual therapy, and  Re-evaluation  PLAN FOR NEXT SESSION:  Assess DN. Progress HEP as tolerated.  Gentle manual through cervical and thoracic spine. Stretch/manual for UT and pecs. Continue AAROM and iso strengthening as able.   Faiza Bansal April Ma L Tell Rozelle, PT, DPT 06/11/2022, 8:53 AM

## 2022-06-13 ENCOUNTER — Encounter: Payer: Self-pay | Admitting: Physical Therapy

## 2022-06-13 ENCOUNTER — Ambulatory Visit: Payer: Medicare HMO | Admitting: Physical Therapy

## 2022-06-13 DIAGNOSIS — M25551 Pain in right hip: Secondary | ICD-10-CM | POA: Diagnosis not present

## 2022-06-13 DIAGNOSIS — M25651 Stiffness of right hip, not elsewhere classified: Secondary | ICD-10-CM | POA: Diagnosis not present

## 2022-06-13 DIAGNOSIS — R262 Difficulty in walking, not elsewhere classified: Secondary | ICD-10-CM | POA: Diagnosis not present

## 2022-06-13 DIAGNOSIS — M25512 Pain in left shoulder: Secondary | ICD-10-CM | POA: Diagnosis not present

## 2022-06-13 DIAGNOSIS — R293 Abnormal posture: Secondary | ICD-10-CM | POA: Diagnosis not present

## 2022-06-13 DIAGNOSIS — M6281 Muscle weakness (generalized): Secondary | ICD-10-CM

## 2022-06-13 DIAGNOSIS — R2689 Other abnormalities of gait and mobility: Secondary | ICD-10-CM | POA: Diagnosis not present

## 2022-06-13 NOTE — Therapy (Addendum)
OUTPATIENT PHYSICAL THERAPY TREATMENT NOTE AND DISCHARGE   Patient Name: Joshua [name] MRN: [id number] DOB:[birth year], 76 y.o., male Today's Date: 06/13/2022  PHYSICAL THERAPY DISCHARGE SUMMARY  Visits from Start of Care: 6  Current functional level related to goals / functional outcomes: See below   Remaining deficits: See below -- no huge improvements noted from eval   Education / Equipment: See below   Patient agrees to discharge. Patient goals were not met. Patient is being discharged due to lack of progress.    PT End of Session - 06/13/22 1053     Visit Number 6    Number of Visits 12    Date for PT Re-Evaluation 07/09/22    Authorization Type [employer]    Progress Note Due on Visit 10    PT Start Time 0930    PT Stop Time 1015    PT Time Calculation (min) 45 min    Activity Tolerance Patient tolerated treatment well    Behavior During Therapy WFL for tasks assessed/performed               Past Medical History:  Diagnosis Date   Abnormal findings on diagnostic imaging of lung 09/23/2019   09/22/2019-lung cancer screening-centrilobular and paraseptal emphysema, calcified granulomata noted bilaterally, no suspicious nodules, lung RADS 1, repeat in 12 months    Anxiety    Atrial fibrillation ([place]) 09/23/2019   Centrilobular emphysema ([place]) 09/23/2019   09/22/2019-lung cancer screening-centrilobular and paraseptal emphysema, calcified granulomata noted bilaterally, no suspicious nodules, lung RADS 1, repeat in 12 months  09/23/2019-FVC 3.74 (81% predicted), postbronchodilator ratio 80, postbronchodilator FEV1 2.88 (86% predicted), no bronchodilator response, DLCO 19.44 (73% predicted)   DDD (degenerative disc disease), cervical 05/13/2008   Cervical Disc Degeneration   Depression    History of adenomatous polyp of colon 03/03/2018   05/14/17: Colonoscopy: nonadvanced adenoma, f/u 5 yrs, use SuPrep, Murphy/GAP  Last Assessment & Plan:  Relevant Hx:  Course: Daily Update: Today's Plan:   Hyperlipidemia    Kidney stone    Nephrolithiasis 05/22/2011   Nontraumatic incomplete tear of right rotator cuff 10/14/2018   Other and unspecified hyperlipidemia 11/20/2009   Hyperlipidemia   RBBB 06/09/2012   Secondary hypercoagulable state ([place]) 09/28/2019   Shortness of breath 09/23/2019   Past Surgical History:  Procedure Laterality Date   CARDIOVERSION N/A 10/15/2019   Procedure: CARDIOVERSION;  Surgeon: Jerline Pain, MD;  Location: [facility] ENDOSCOPY;  Service: Cardiovascular;  Laterality: N/A;   CARDIOVERSION N/A 11/29/2019   Procedure: CARDIOVERSION;  Surgeon: Dorothy Spark, MD;  Location: [facility] ENDOSCOPY;  Service: Cardiovascular;  Laterality: N/A;   [place] Right 12/21/2021   Procedure: TOTAL HIP ARTHROPLASTY ANTERIOR APPROACH;  Surgeon: Dorna Leitz, MD;  Location: [facility] ORS;  Service: Orthopedics;  Laterality: Right;   VARICOSE VEIN SURGERY Right    Patient Active Problem List   Diagnosis Date Noted   Alcohol abuse 05/31/2022   Disorder of thoracic spine 05/31/2022   COVID-19 virus infection 03/04/2022   Acute bronchitis 03/04/2022   OSA (obstructive sleep apnea) 03/04/2022   PAF (paroxysmal atrial fibrillation) ([place]) 12/13/2021   Anxiety 11/08/2021   Aphasia 11/08/2021   Bipolar 1 disorder ([place]) 11/08/2021   CKD (chronic kidney disease), stage [name] ([place]) 11/08/2021   Expressive aphasia 11/08/2021   Prolonged QT interval 11/08/2021   Recurrent falls 11/08/2021   Primary osteoarthritis of right hip 10/26/2021   Abnormal liver function tests 09/10/2021  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   Urinary hesitancy 03/08/2021   Thoracic aortic aneurysm without rupture ([place]) 03/08/2021   Senile purpura ([place]) 09/07/2020   Aortic atherosclerosis ([place]) 09/07/2020   Sinus bradycardia 03/31/2020   Essential hypertension 03/31/2020   Major depressive disorder 03/07/2020   Essential tremor 03/07/2020   Tremor  01/21/2020   Obesity (BMI 30-39.9) 12/10/2019   Chest pain of uncertain etiology 62/86/3817   Secondary hypercoagulable state ([place]) 09/28/2019   Centrilobular emphysema ([place]) 09/23/2019   Former smoker 09/23/2019   Abnormal findings on diagnostic imaging of lung 09/23/2019   Atrial fibrillation ([place]) 09/23/2019   At risk for sleep apnea 09/23/2019   Shortness of breath 09/23/2019   Nontraumatic incomplete tear of right rotator cuff 10/14/2018   History of adenomatous polyp of colon 03/03/2018   RBBB 06/09/2012   Nephrolithiasis 05/22/2011   Mixed hyperlipidemia 11/20/2009   DDD (degenerative disc disease), cervical 05/13/2008    PCP: [name]  REFERRING PROVIDER: Gregor Hams, MD  REFERRING DIAG: [phone] (ICD-10-CM) - Acute pain of left shoulder M54.2 (ICD-10-CM) - Neck pain  THERAPY DIAG:  Acute pain of left shoulder  Abnormal posture  Muscle weakness (generalized)  Rationale for Evaluation and Treatment Rehabilitation  ONSET DATE: ~1 month ago  SUBJECTIVE:                                                                                                                                                                                      SUBJECTIVE STATEMENT: Pt reports nothing new or different. Pain remains the same. Ktape did not seem to do too much to help with his pain.    PERTINENT HISTORY: No prior shoulder injuries R THA R RTC repair  PAIN:  Are you having pain?   Yes: NPRS scale: 7/10 Pain location: Radiates top of shoulder to elbow Pain description: Throbbing, aching Aggravating factors: Increased movement Relieving factors: Changing positions, gabapentin  PRECAUTIONS: None   LIVING ENVIRONMENT: Lives with: lives with their family and lives with their spouse Lives in: House/apartment  OCCUPATION: Retired  PLOF: Independent  PATIENT GOALS  Improve pain and shoulder movement  OBJECTIVE:   DIAGNOSTIC FINDINGS:  X-ray  PATIENT  SURVEYS:  FOTO 41; predicted 66 at visit  12  COGNITION:  Overall cognitive status: Within functional limits for tasks assessed     SENSATION: WFL  POSTURE: Forward head posture, thoracic kyphosis  UPPER EXTREMITY ROM: PROM is WFL -- only limited due to pain  Active ROM Right eval Left eval   Shoulder flexion 150 150 full ROM, >90 deg painful   Shoulder extension 80 60   Shoulder abduction 160 124 full ROM, >85 deg  painful   Shoulder adduction     Shoulder internal rotation     Shoulder external rotation 55 45   (Blank rows = not tested)  UPPER EXTREMITY MMT:  MMT Right eval Left eval  Shoulder flexion  4+/5  Shoulder extension  5/5  Shoulder abduction  3+/5  Shoulder adduction    Shoulder internal rotation  3+/5  Shoulder external rotation  3+/5  Middle trapezius  3+/5  Lower trapezius    Elbow flexion    Elbow extension    (Blank rows = not tested)  SHOULDER SPECIAL TESTS:  Impingement tests: Hawkins/Kennedy impingement test: positive , Painful arc test: positive , and O' Briens test: positive   SLAP lesions: Biceps load test: positive   Instability tests: Apprehension test: positive   Rotator cuff assessment: Empty can test: positive   Biceps assessment: Speed's test: positive   JOINT MOBILITY TESTING:  Limited due to pt guarding/pain; however, GH hypermobile inferiorly with increased pain. Hypomobile thoracic spine   PALPATION:  TTP along bicep, UT, levators, cervical paraspinals   TODAY'S TREATMENT:  06/13/22 Pulleys for warm up flex & scaption x 2 min each Supine  Shoulder retraction 2x10 Sidelying  "I" x10  Elbow bent behind head coming into shoulder ER 2x10  Scapular clock 3 to 6 o'clock 2x10  Shoulder ER 2x10 Manual therapy:  STM & TPR UT, pecs, teres minor/major, triceps  Scapular mobilizations  Trigger Point Dry-Needling  Treatment instructions: Expect mild to moderate muscle soreness. S/S of pneumothorax if dry needled over a lung field,  and to seek immediate medical attention should they occur. Patient verbalized understanding of these instructions and education.  Patient Consent Given: Yes Education handout provided: Previously provided Muscles treated: Left UT, pec minor & major, triceps, deltoids, Teres minor/major, lats Electrical stimulation performed: Yes to UTs Parameters: 2 microAmps, to pt tolerance/muscle twitch x 5 min Treatment response/outcome: Twitch Response Elicited and Palpable Increase in Muscle Length  06/11/22 Pulleys for warm up flex & scaption x 2 min each Seated:  Shoulder retraction 2x10  Shoulder ER 2x10  "W" 2x10 Standing:  Row red tband 2x10 Supine:  Shoulder flexion AAROM 10x3 sec  Shoulder abd AAROM 10x3 sec Manual: Skilled palpation and monitoring of soft tissues during DN.  STM to left UT, deltoids, and scapular muscles K tape for scapular stabilization Trigger Point Dry-Needling  Treatment instructions: Expect mild to moderate muscle soreness. S/S of pneumothorax if dry needled over a lung field, and to seek immediate medical attention should they occur. Patient verbalized understanding of these instructions and education.  Patient Consent Given: Yes Education handout provided: Previously provided Muscles treated: Left UT, left thoracic paraspinals, triceps, deltoids, Teres minor/major, lats Electrical stimulation performed: No Parameters: N/A Treatment response/outcome: Twitch Response Elicited and Palpable Increase in Muscle Length    PATIENT EDUCATION: Education details: HEP update 8/1//23 Person educated: Patient Education method: Explanation, Demonstration, and Handouts Education comprehension: verbalized understanding, returned demonstration, and needs further education   HOME EXERCISE PROGRAM: Access Code: [email or web address]  ASSESSMENT:  CLINICAL IMPRESSION: Continued TPDN to improve muscle imbalances. Hypomobile with inferior GH glides; however, pt has difficulty  tolerating GH mobilizations. Pt continues to guard with scapular strengthening and pain even during gentle scapular retractions. Requires constant cues to maintain correct scapular positioning. Pain continues to alternate in front and back of humerus. Pt would benefit from further medical work up.       OBJECTIVE IMPAIRMENTS decreased activity tolerance, decreased mobility, decreased ROM, decreased strength, increased fascial  restrictions, increased muscle spasms, impaired UE functional use, postural dysfunction, and pain.   ACTIVITY LIMITATIONS carrying, lifting, toileting, dressing, reach over head, and hygiene/grooming  PARTICIPATION LIMITATIONS: cleaning, laundry, community activity, and yard work  PERSONAL FACTORS Age and Past/current experiences are also affecting patient's functional outcome.   REHAB POTENTIAL: Good  CLINICAL DECISION MAKING: Evolving/moderate complexity  EVALUATION COMPLEXITY: Moderate   GOALS: Goals reviewed with patient? Yes  SHORT TERM GOALS: Target date: 06/18/2022   Pt will be independent with initial HEP Baseline: Not provided yet Goal status: INITIAL  2.  Pt will report improve resting shoulder pain by at least 50% Baseline: 8/10 Goal status: INITIAL  3.  Pt will be able to tolerate pain free scapular and neck retraction for improved postural stability Baseline: Increased pain Goal status: INITIAL    LONG TERM GOALS: Target date: 07/09/2022   Pt will be independent with advanced HEP Baseline:  Goal status: INITIAL  2.  Pt will have pain free L=R shoulder ROM Baseline:  Goal status: INITIAL  3.  Pt will be able to return to overhead lifting and pulling without pain for gym activities Baseline:  Goal status: INITIAL  4.  Pt will demo at least 4+/5 shoulder strength for improved scapular mobility Baseline:  Goal status: INITIAL  5.  Pt will increase FOTO score to >/=63 Baseline: 49 Goal status: INITIAL    PLAN: PT FREQUENCY:  2x/week  PT DURATION: 6 weeks  PLANNED INTERVENTIONS: Therapeutic exercises, Therapeutic activity, Neuromuscular re-education, Balance training, Gait training, Patient/Family education, Self Care, Joint mobilization, Dry Needling, Electrical stimulation, Cryotherapy, Moist heat, Taping, Vasopneumatic device, Ionotophoresis 88m/ml Dexamethasone, Manual therapy, and Re-evaluation  PLAN FOR NEXT SESSION:  Progress HEP as tolerated.  Gentle manual through cervical and thoracic spine. Stretch/manual for UT and pecs. Continue AAROM and iso strengthening as able.   Yamil Oelke April Ma L Sylvana Bonk, PT, DPT 06/13/2022, 10:53 AM

## 2022-06-18 ENCOUNTER — Encounter: Payer: Medicare HMO | Admitting: Physical Therapy

## 2022-06-20 ENCOUNTER — Encounter: Payer: Medicare HMO | Admitting: Physical Therapy

## 2022-06-25 ENCOUNTER — Ambulatory Visit: Payer: Medicare HMO | Admitting: Pulmonary Disease

## 2022-06-26 ENCOUNTER — Encounter: Payer: Self-pay | Admitting: General Practice

## 2022-06-27 ENCOUNTER — Ambulatory Visit: Payer: Medicare HMO | Admitting: Family Medicine

## 2022-06-27 ENCOUNTER — Encounter: Payer: Medicare HMO | Admitting: Physical Therapy

## 2022-06-28 ENCOUNTER — Ambulatory Visit: Payer: Medicare HMO | Admitting: Family Medicine

## 2022-06-28 VITALS — BP 116/68 | HR 54 | Ht 71.0 in | Wt 215.0 lb

## 2022-06-28 DIAGNOSIS — M5412 Radiculopathy, cervical region: Secondary | ICD-10-CM

## 2022-06-28 DIAGNOSIS — M25512 Pain in left shoulder: Secondary | ICD-10-CM

## 2022-06-28 MED ORDER — PREDNISONE 50 MG PO TABS: 50.0000 mg | ORAL_TABLET | Freq: Every day | ORAL | 0 refills | Status: DC

## 2022-06-28 NOTE — Addendum Note (Signed)
Addended by: Pollyann Glen on: 06/28/2022 02:26 PM   Modules accepted: Orders

## 2022-06-28 NOTE — Progress Notes (Signed)
I, [name], LAT, ATC acting as a scribe for [name], MD.  Joshua [name] is a 76 y.o. male who presents to [place] at [facility] today for f/u unclear L arm pain and L-sided neck pain. Pt was last seen by Dr. [name] on 05/21/22 and was prescribed gabapentin, prednisone, and referred to PT, completing 6 visits. Today, pt reports PT did tons of treatment and gave him HEP, but L arm pain is terrible. Pt notes side effects from the Gabapentin, like jerking motions. Pt locates pain to the L-side of his neck, into the L posterior shoulder, and over to the midline of his neck.     Dx imaging: 05/21/22 C-spine & L shoulder XR  Pertinent review of systems: No fevers or chills  Relevant historical information: Atrial fibrillation on Eliquis.  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  He is over 90 days sober now.  He does take naltrexone as part of his craving control.   Exam:  BP 116/68   Pulse (!) 54   Ht '5\' 11"'$  (1.803 m)   Wt 215 lb (97.5 kg)   SpO2 97%   BMI 29.99 kg/m  General: Well Developed, well nourished, and in no acute distress.   MSK: C-spine: Normal appearing Decreased cervical motion significantly limited lateral flexion. Upper extremity strength is intact except were noted below. Reflexes are intact. Positive Spurling's test.  Left shoulder: Normal-appearing Nontender. Decreased range of motion pain with abduction. Positive Hawkins and Neer's test. Strength reduced abduction 4/5 External and internal rotation strength are intact Pulses capillary fill and sensation are intact distally.   Lab and Radiology Results  EXAM: CERVICAL SPINE - 2-3 VIEW   COMPARISON:  None Available.   FINDINGS: 2.6 mm anterolisthesis of C4 versus C5. No other significant malalignment. Degenerative disc disease most marked at C6-7. Multilevel uncovertebral degenerative changes. No fractures. No other significant abnormalities.    IMPRESSION: Degenerative changes as above. 2.6 mm of anterolisthesis of C4 versus C5 is likely secondary to facet degenerative changes.     Electronically Signed   By: Dorise Bullion [name] M.D.   On: 05/21/2022 17:44   EXAM: LEFT SHOULDER - 2+ VIEW   COMPARISON:  None Available.   FINDINGS: No acute fracture or dislocation identified. Mild narrowing of the glenohumeral joint and moderate to severe narrowing of the acromioclavicular joint with small inferior marginal osteophytes. No focal bone lesions identified.   IMPRESSION: Degenerative changes with no acute osseous abnormality identified.     Electronically Signed   By: Ofilia Neas M.D.   On: 05/21/2022 16:43   I, [name], personally (independently) visualized and performed the interpretation of the images attached in this note.   Assessment and Plan: 76 y.o. male with multifactorial left shoulder neck and arm pain.  I believe a fair amount of the pain is shoulder pain related most likely rotator cuff.  He does have some shoulder instability especially noted in physical therapy which is concerning.  At this point plan on MRI of the left shoulder to further evaluate pain, dysfunction, instability and to dictate and determine future treatment plan or options such as injection or surgery.  Additionally he has symptoms concerning for cervical radiculopathy.  This is confusing his diagnosis.  He is already taking gabapentin which he is having trouble tolerating even mild doses of.  Plan to use a short course of prednisone and to proceed to cervical spine MRI.  Recheck after MRI of the  shoulder and MRI C-spine.     PDMP not reviewed this encounter. Orders Placed This Encounter  Procedures   MR SHOULDER LEFT WO CONTRAST    Standing Status:   Future    Standing Expiration Date:   06/29/2023    Order Specific Question:   What is the patient's sedation requirement?    Answer:   No Sedation    Order Specific  Question:   Does the patient have a pacemaker or implanted devices?    Answer:   No    Order Specific Question:   Preferred imaging location?    Answer:   Product/process development scientist (table limit-350lbs)   MR CERVICAL SPINE WO CONTRAST    Standing Status:   Future    Standing Expiration Date:   06/29/2023    Order Specific Question:   What is the patient's sedation requirement?    Answer:   No Sedation    Order Specific Question:   Does the patient have a pacemaker or implanted devices?    Answer:   No    Order Specific Question:   Preferred imaging location?    Answer:   Product/process development scientist (table limit-350lbs)   Meds ordered this encounter  Medications   predniSONE (DELTASONE) 50 MG tablet    Sig: Take 1 tablet (50 mg total) by mouth daily.    Dispense:  5 tablet    Refill:  0     Discussed warning signs or symptoms. Please see discharge instructions. Patient expresses understanding.   The above documentation has been reviewed and is accurate and complete [name], M.D.

## 2022-06-28 NOTE — Patient Instructions (Addendum)
Thank you for coming in today.   You should hear from MRI scheduling within 1 week. If you do not hear please let me know.    Recheck after the MRIs.   Let me know if you have a problem.    

## 2022-07-02 ENCOUNTER — Ambulatory Visit: Payer: Medicare HMO | Admitting: Physical Therapy

## 2022-07-02 DIAGNOSIS — R419 Unspecified symptoms and signs involving cognitive functions and awareness: Secondary | ICD-10-CM | POA: Diagnosis not present

## 2022-07-09 DIAGNOSIS — R419 Unspecified symptoms and signs involving cognitive functions and awareness: Secondary | ICD-10-CM | POA: Diagnosis not present

## 2022-07-09 DIAGNOSIS — F32A Depression, unspecified: Secondary | ICD-10-CM | POA: Diagnosis not present

## 2022-07-14 ENCOUNTER — Ambulatory Visit
Admission: RE | Admit: 2022-07-14 | Discharge: 2022-07-14 | Disposition: A | Discharge status: Home / Self Care | Payer: Medicare HMO | Source: Ambulatory Visit | Attending: Family Medicine | Admitting: Family Medicine

## 2022-07-14 DIAGNOSIS — M7552 Bursitis of left shoulder: Secondary | ICD-10-CM | POA: Diagnosis not present

## 2022-07-14 DIAGNOSIS — M4802 Spinal stenosis, cervical region: Secondary | ICD-10-CM | POA: Diagnosis not present

## 2022-07-14 DIAGNOSIS — M5412 Radiculopathy, cervical region: Secondary | ICD-10-CM | POA: Diagnosis not present

## 2022-07-14 DIAGNOSIS — M19012 Primary osteoarthritis, left shoulder: Secondary | ICD-10-CM | POA: Diagnosis not present

## 2022-07-14 DIAGNOSIS — M4312 Spondylolisthesis, cervical region: Secondary | ICD-10-CM | POA: Diagnosis not present

## 2022-07-14 DIAGNOSIS — M75112 Incomplete rotator cuff tear or rupture of left shoulder, not specified as traumatic: Secondary | ICD-10-CM | POA: Diagnosis not present

## 2022-07-14 DIAGNOSIS — M25512 Pain in left shoulder: Secondary | ICD-10-CM

## 2022-07-16 ENCOUNTER — Telehealth: Payer: Medicare HMO

## 2022-07-16 NOTE — Progress Notes (Signed)
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  This could cause shoulder pain certainly.  Recommend return to clinic to go over the results of the shoulder MRI as well as the cervical spine MRI.  We need to just come up with a good treatment plan for both problems.

## 2022-07-16 NOTE — Progress Notes (Signed)
Cervical spine MRI shows pinched nerves at multiple levels in your neck that could cause arm and shoulder pain.  Recommend return to clinic to go over the results of this cervical spine MRI as well as your shoulder MRI and come up with a treatment plan for both problems.

## 2022-07-17 NOTE — Progress Notes (Signed)
I, Joshua [name], LAT, ATC acting as a scribe for Joshua Leader, MD.  Joshua [name] is a 76 y.o. male who presents to [place] at [facility] today for f/u L shoulder and neck pain w/ MRI reviews. Pt completed prior course of PT, 6 visits, ending on 06/13/22. Pt was last seen by Dr. [name] on 06/28/22 and was prescribed prednisone and advised to proceed to cervical and L shoulder MRI. Today, pt reports he is feeling about the same. Pt notes when he was taking the prednisone, it was helping his pain. Pt has been taking the Gabapentin.   Dx imaging: 07/14/22 C-spine & L shoulder MRI 05/21/22 C-spine & L shoulder XR  Pertinent review of systems: No fevers or chills  Relevant historical information: Atrial fibrillation on Eliquis.   Exam:  BP 112/72   Pulse (!) 46   Ht '5\' 11"'$  (1.803 m)   Wt 216 lb (98 kg)   SpO2 97%   BMI 30.13 kg/m  General: Well Developed, well nourished, and in no acute distress.   MSK: Shoulder: Normal-appearing Range of motion reduced range of motion to abduction 130 degrees.  Pain with abduction.  Normal internal rotation and external rotation.  Intact strength pain with abduction.  Positive Hawkins and Neer's test.  C-spine: Normal appearing nontender to midline. Reduced cervical range of motion. Positive Spurling's test. Upper extremity strength is intact.    Lab and Radiology Results  Procedure: Real-time Ultrasound Guided Injection of left shoulder subacromial bursa Device: Philips Affiniti 50G Images permanently stored and available for review in PACS Verbal informed consent obtained.  Discussed risks and benefits of procedure. Warned about infection, bleeding, hyperglycemia damage to structures among others. Patient expresses understanding and agreement Time-out conducted.   Noted no overlying erythema, induration, or other signs of local infection.   Skin prepped in a sterile fashion.   Local anesthesia: Topical Ethyl  chloride.   With sterile technique and under real time ultrasound guidance: 40 mg of Kenalog and 2 mL of Marcaine injected into subacromial bursa. Fluid seen entering the bursa.   Completed without difficulty   Pain moderately resolved suggesting accurate placement of the medication.   Advised to call if fevers/chills, erythema, induration, drainage, or persistent bleeding.   Images permanently stored and available for review in the ultrasound unit.  Impression: Technically successful ultrasound guided injection.   EXAM: MRI OF THE LEFT SHOULDER WITHOUT CONTRAST   TECHNIQUE: Multiplanar, multisequence MR imaging of the shoulder was performed. No intravenous contrast was administered.   COMPARISON:  Left shoulder radiographs 05/21/2022   FINDINGS: Rotator cuff: There is horizontal linear increased T2 signal with fluid bright portions within the insertion of the far anterior supraspinatus tendon footprint indicating a partial-thickness midsubstance tear measuring up to 8 mm in transverse dimension (coronal series 7 images 9 through 12) and 13 mm in AP dimension (sagittal series 4, images 24 and 25). Mild-to-moderate underlying supraspinatus tendinosis. The infraspinatus, subscapularis, and teres minor are intact.   Muscles: No rotator cuff muscle atrophy, fatty infiltration, or edema.   Biceps long head: The intra-articular long head of the biceps tendon is intact.   Acromioclavicular Joint: There are moderate degenerative changes of the acromioclavicular joint including joint space narrowing, subchondral marrow edema, and peripheral osteophytosis. Type II acromion. Mild fluid within the subacromial/subdeltoid bursa.   Glenohumeral Joint: Moderate glenoid cartilage thinning with multifocal high-grade partial to full-thickness cartilage loss. Moderate humeral head cartilage thinning, greatest at the inferior medial  aspect.   Labrum: Grossly intact, but evaluation is limited  by lack of intraarticular fluid.   Bones:  No acute fracture.   Other: None.   IMPRESSION: 1. Horizontal linear partial-thickness midsubstance tear of the anterior supraspinatus tendon footprint measuring up to 13 mm in AP dimension and 8 mm in transverse dimension. 2. Moderate degenerative changes of the acromioclavicular joint. 3. Mild subacromial/subdeltoid bursitis. 4. Moderate glenohumeral osteoarthritis.     Electronically Signed   By: [name] M.D.   On: 07/15/2022 16:57    EXAM: MRI CERVICAL SPINE WITHOUT CONTRAST   TECHNIQUE: Multiplanar, multisequence MR imaging of the cervical spine was performed. No intravenous contrast was administered.   COMPARISON:  Cervical spine radiographs 05/21/2022   FINDINGS: Alignment: Cervical spine straightening. Trace anterolisthesis of C4 on C5 and C5 on C6.   Vertebrae: No fracture or suspicious marrow lesion. Mild right-sided facet edema at C7-T1. Chronic degenerative endplate changes at Z6-1 associated with moderate disc space narrowing.   Cord: Normal signal.   Posterior Fossa, vertebral arteries, paraspinal tissues: Unremarkable.   Disc levels:   C2-3: Moderate right and mild left facet arthrosis result in mild right neural foraminal stenosis without spinal stenosis.   C3-4: Disc bulging, uncovertebral spurring, and moderate right and severe left facet arthrosis result in moderate to severe bilateral neural foraminal stenosis without spinal stenosis.   C4-5: Anterolisthesis with disc uncovering, uncovertebral spurring, and moderate right and severe left facet arthrosis result in mild-to-moderate left neural foraminal stenosis without spinal stenosis.   C5-6: Disc bulging, a left paracentral to left foraminal disc protrusion, uncovertebral spurring, and moderate facet arthrosis result in moderate right and moderate to severe left neural foraminal stenosis with potential left C6 nerve root impingement.    C6-7: Disc bulging and uncovertebral spurring result in borderline spinal stenosis and moderate to severe bilateral neural foraminal stenosis with potential bilateral C7 nerve root impingement.   C7-T1: Moderate right and mild left facet arthrosis without disc herniation or stenosis.   IMPRESSION: 1. Multilevel cervical disc degeneration and advanced facet arthrosis without significant spinal stenosis. 2. Moderate to severe neural foraminal stenosis at C3-4, C5-6, and C6-7.     Electronically Signed   By: Logan Bores M.D.   On: 07/15/2022 17:03    I, Joshua [name], personally (independently) visualized and performed the interpretation of the images attached in this note.   Assessment and Plan: 76 y.o. male with left shoulder pain and left arm pain.  Multifactorial.  The shoulder pain itself is somewhat secondary to the arm pain which is thought to be cervical radicular.  Shoulder pain due to rotator cuff tendinopathy and partial tear and bursitis.   Plan for subacromial bursa injection today.  Also plan for epidural steroid injection in the near future.  We will switch from gabapentin to Lyrica.  Check back in 1 month.   PDMP reviewed during this encounter. Orders Placed This Encounter  Procedures   [place] LIMITED JOINT SPACE STRUCTURES UP LEFT(NO LINKED CHARGES)    Order Specific Question:   Reason for Exam (SYMPTOM  OR DIAGNOSIS REQUIRED)    Answer:   left shoulder pain    Order Specific Question:   Preferred imaging location?    Answer:   [facility]   DG INJECT DIAG/THERA/INC NEEDLE/CATH/PLC EPI/CERV/THOR W/IMG    CERV EPI #1 [id number] LBS PACS (07/14/22) Arne [place]*    Standing Status:   Future    Standing Expiration Date:   08/17/2022  Order Specific Question:   Reason for Exam (SYMPTOM  OR DIAGNOSIS REQUIRED)    Answer:   neck pain    Order Specific Question:   Preferred Imaging Location?    Answer:   [id number]   Meds ordered this  encounter  Medications   pregabalin (LYRICA) 75 MG capsule    Sig: Take 1 capsule (75 mg total) by mouth 2 (two) times daily as needed.    Dispense:  60 capsule    Refill:  3     Discussed warning signs or symptoms. Please see discharge instructions. Patient expresses understanding.   The above documentation has been reviewed and is accurate and complete Joshua [name], M.D.

## 2022-07-18 ENCOUNTER — Ambulatory Visit: Payer: Medicare HMO | Admitting: Family Medicine

## 2022-07-18 ENCOUNTER — Ambulatory Visit: Payer: Self-pay

## 2022-07-18 VITALS — BP 112/72 | HR 46 | Ht 71.0 in | Wt 216.0 lb

## 2022-07-18 DIAGNOSIS — M25512 Pain in left shoulder: Secondary | ICD-10-CM | POA: Diagnosis not present

## 2022-07-18 DIAGNOSIS — M5412 Radiculopathy, cervical region: Secondary | ICD-10-CM

## 2022-07-18 MED ORDER — PREGABALIN 75 MG PO CAPS: 75.0000 mg | ORAL_CAPSULE | Freq: Two times a day (BID) | ORAL | 3 refills | Status: DC | PRN

## 2022-07-18 NOTE — Patient Instructions (Addendum)
Thank you for coming in today.   You received an injection today. Seek immediate medical attention if the joint becomes red, extremely painful, or is oozing fluid.   Please call [facility] at [phone] to schedule your spine injection.    Check back in 1 month  STOP gabapentin.   START lyrica '75mg'$  2x daily for nerve pain.

## 2022-07-20 ENCOUNTER — Other Ambulatory Visit (HOSPITAL_COMMUNITY): Payer: Self-pay | Admitting: Physician Assistant

## 2022-07-22 ENCOUNTER — Encounter: Payer: Self-pay | Admitting: Pulmonary Disease

## 2022-07-22 ENCOUNTER — Ambulatory Visit: Payer: Medicare HMO | Admitting: Pulmonary Disease

## 2022-07-22 ENCOUNTER — Telehealth: Payer: Self-pay

## 2022-07-22 ENCOUNTER — Telehealth: Payer: Self-pay | Admitting: Family Medicine

## 2022-07-22 VITALS — BP 120/70 | HR 65 | Temp 97.9°F | Ht 71.0 in | Wt 219.0 lb

## 2022-07-22 DIAGNOSIS — R0602 Shortness of breath: Secondary | ICD-10-CM

## 2022-07-22 DIAGNOSIS — J432 Centrilobular emphysema: Secondary | ICD-10-CM

## 2022-07-22 NOTE — Patient Instructions (Signed)
I will see you in 3 months  Obtain CT scan of the chest without contrast for left lower lobe scarring on previous CT  Obtain pulmonary function test at next visit  Call us with significant concerns

## 2022-07-22 NOTE — Telephone Encounter (Signed)
Called and spoke to pt who reports not being able to get through to [place] to schedule his ESI. Confirmed he had both numbers correct. Then called [facility] at [phone] to see if there was an issue on there end. Employee I spoke w/ said that she had called and left pt 2 VM and has not been able to get ahold of him. She also reported having computer issues intermittently today. Called pt back and told him this information and he was mystified and said that he was currently at another doctor's appointment and then he may be available to take their call around 5:00 today. Pt stated that he would continue to try to call them again tomorrow. Pt did verbalize back the incorrect phone number [phone] vs [phone] but said that I mis-head him. Pt advised to let us know if he continues to have issues.

## 2022-07-22 NOTE — Telephone Encounter (Signed)
Patient called stating that he has been having trouble getting in touch with [facility] to schedule the epidural injection that was ordered. He has called them multiple times and no one will return his call. He asked if there was another office that he could be referred to for the injections?  Please advise.

## 2022-07-22 NOTE — Progress Notes (Signed)
Joshua [name]    [id number]    1946-02-05  Primary Care Physician:Matthews, Einar Pheasant, DO  Referring Physician: [name], [address] [place],  [place] [address]  Chief complaint:   History of obstructive sleep apnea  HPI:  Had a sleep study in 2021 showing moderate apnea Tried CPAP for a while but could not tolerate it  He feels he is not snoring as much Not having apnea events He is currently not interested in any further evaluation for sleep apnea  History of atrial fibrillation for which he had ablation, is feeling much better following that  I did review previous pulmonary function test which was normal, a previous CT scan of the chest and a cardiac imaging CT does show some interstitial changes most prominent at the left base  Gives a history of having hemoptysis about 2025 years ago for which she had extensive work-up that was unrevealing  Quit smoking over 20 years ago  He feels his sleep is much better than what it has been-he does not believe he would benefit from any evaluation for sleep apnea at present  Usually tries to go to bed between 12 and 2 AM Takes him about 5 minutes to fall asleep Final awakening time about 9 AM Wakes up about 0-2 times during the night to use the bathroom  No dryness of his mouth in the morning Memory is good-long-term memory is excellent, short-term memory can be spotty No headaches in the mornings  Unaware of any family history of obstructive sleep apnea   Outpatient Encounter Medications as of 07/22/2022  Medication Sig   amiodarone (PACERONE) 200 MG tablet TAKE 1/2 TABLET BY MOUTH DAILY   apixaban (ELIQUIS) 5 MG TABS tablet Take 5 mg by mouth 2 (two) times daily.   atorvastatin (LIPITOR) 40 MG tablet TAKE ONE TABLET BY MOUTH DAILY   clobetasol (TEMOVATE) 0.05 % external solution Apply 1 application topically once a week. Use in hair   fluorouracil (EFUDEX) 5 % cream Apply 1  application topically daily as needed (On head over night).   FLUoxetine (PROZAC) 20 MG capsule Take 20 mg by mouth daily.    ketoconazole (NIZORAL) 2 % shampoo Apply 1 application topically 2 (two) times a week. Monday and Thursday   lamoTRIgine (LAMICTAL) 200 MG tablet Take 100 mg by mouth 2 (two) times daily.   naltrexone (DEPADE) 50 MG tablet Take 50 mg by mouth every morning.   polyethylene glycol powder (GLYCOLAX/MIRALAX) 17 GM/SCOOP powder Take 17 g by mouth 2 (two) times daily.   pregabalin (LYRICA) 75 MG capsule Take 1 capsule (75 mg total) by mouth 2 (two) times daily as needed.   tamsulosin (FLOMAX) 0.4 MG CAPS capsule Take 0.4 mg by mouth every other day.   [DISCONTINUED] predniSONE (DELTASONE) 50 MG tablet Take 1 tablet (50 mg total) by mouth daily. (Patient not taking: Reported on 07/22/2022)   No facility-administered encounter medications on file as of 07/22/2022.    Allergies as of 07/22/2022 - Review Complete 07/22/2022  Allergen Reaction Noted   Venlafaxine Palpitations 07/08/2013   Bupropion Other (See Comments) 05/14/2011   Tramadol Other (See Comments) 11/14/2021   Zoloft [sertraline hcl] Other (See Comments) 05/14/2011    Past Medical History:  Diagnosis Date   Abnormal findings on diagnostic imaging of lung 09/23/2019   09/22/2019-lung cancer screening-centrilobular and paraseptal emphysema, calcified granulomata noted bilaterally, no suspicious nodules, lung RADS 1,  repeat in 12 months    Anxiety    Atrial fibrillation ([place]) 09/23/2019   Centrilobular emphysema ([place]) 09/23/2019   09/22/2019-lung cancer screening-centrilobular and paraseptal emphysema, calcified granulomata noted bilaterally, no suspicious nodules, lung RADS 1, repeat in 12 months  09/23/2019-FVC 3.74 (81% predicted), postbronchodilator ratio 80, postbronchodilator FEV1 2.88 (86% predicted), no bronchodilator response, DLCO 19.44 (73% predicted)   DDD (degenerative disc disease), cervical 05/13/2008    Cervical Disc Degeneration   Depression    History of adenomatous polyp of colon 03/03/2018   05/14/17: Colonoscopy: nonadvanced adenoma, f/u 5 yrs, use SuPrep, [place]/GAP  Last Assessment & Plan:  Relevant Hx: Course: Daily Update: Today's Plan:   Hyperlipidemia    Kidney stone    Nephrolithiasis 05/22/2011   Nontraumatic incomplete tear of right rotator cuff 10/14/2018   Other and unspecified hyperlipidemia 11/20/2009   Hyperlipidemia   RBBB 06/09/2012   Secondary hypercoagulable state ([place]) 09/28/2019   Shortness of breath 09/23/2019    Past Surgical History:  Procedure Laterality Date   CARDIOVERSION N/A 10/15/2019   Procedure: CARDIOVERSION;  Surgeon: Jerline Pain, MD;  Location: [facility] ENDOSCOPY;  Service: Cardiovascular;  Laterality: N/A;   CARDIOVERSION N/A 11/29/2019   Procedure: CARDIOVERSION;  Surgeon: Dorothy Spark, MD;  Location: [facility] ENDOSCOPY;  Service: Cardiovascular;  Laterality: N/A;   [place] Right 12/21/2021   Procedure: TOTAL HIP ARTHROPLASTY ANTERIOR APPROACH;  Surgeon: Dorna Leitz, MD;  Location: [facility] ORS;  Service: Orthopedics;  Laterality: Right;   VARICOSE VEIN SURGERY Right     Family History  Problem Relation Age of Onset   Varicose Veins Mother    Stroke Father     Social History   Socioeconomic History   Marital status: Married    Spouse name: Not on file   Number of children: Not on file   Years of education: Not on file   Highest education level: Not on file  Occupational History   Not on file  Tobacco Use   Smoking status: Former    Packs/day: 2.00    Years: 46.00    Total pack years: 92.00    Types: Cigarettes    Quit date: 2010    Years since quitting: 13.7   Smokeless tobacco: Never   Tobacco comments:    Former smoker 05/02/22  Vaping Use   Vaping Use: Never used  Substance and Sexual Activity   Alcohol use: Not Currently    Alcohol/week: 14.0 standard drinks of alcohol    Types: 14  Cans of beer per week    Comment: stop drinking 44 days ago 05/02/22   Drug use: No   Sexual activity: Yes    Partners: Female  Other Topics Concern   Not on file  Social History Narrative   Not on file   Social Determinants of Health   Financial Resource Strain: Not on file  Food Insecurity: Not on file  Transportation Needs: Not on file  Physical Activity: Not on file  Stress: Not on file  Social Connections: Not on file  Intimate Partner Violence: Not on file    Review of Systems  Respiratory:  Positive for apnea.   Psychiatric/Behavioral:  Positive for sleep disturbance.     Vitals:   07/22/22 1603  BP: 120/70  Pulse: 65  Temp: 97.9 F (36.6 C)  SpO2: 96%    Physical Exam Constitutional:      Appearance: He is well-developed.  HENT:  Head: Normocephalic and atraumatic.  Eyes:     General:        Right eye: No discharge.     Conjunctiva/sclera: Conjunctivae normal.     Pupils: Pupils are equal, round, and reactive to light.  Neck:     Thyroid: No thyromegaly.  Cardiovascular:     Rate and Rhythm: Normal rate and regular rhythm.  Pulmonary:     Effort: Pulmonary effort is normal. No respiratory distress.     Breath sounds: Normal breath sounds. No wheezing or rales.  Chest:     Chest wall: No tenderness.  Musculoskeletal:        General: Normal range of motion.     Cervical back: Normal range of motion and neck supple.  Skin:    General: Skin is warm and dry.  Neurological:     Mental Status: He is alert.   Data Reviewed: Sleep study from 2021 reviewed showing moderate obstructive sleep apnea with AHI of 22  Assessment:  Moderate obstructive sleep apnea -Not interested in any further evaluation or treatment -Feels he does not have any significant symptoms -Will not go back to using CPAP  Obesity  Abnormal CT scan of the chest showing prominent interstitial changes at left base  Plan/Recommendations: Obtain CT scan of the chest without  contrast  Obtain pulmonary function test  Encourage continuing weight loss efforts  Follow-up in 3 months   Sherrilyn Rist MD [facility] 07/22/2022, 4:12 PM  CC: [name], DO

## 2022-07-30 ENCOUNTER — Ambulatory Visit
Admission: RE | Admit: 2022-07-30 | Discharge: 2022-07-30 | Disposition: A | Discharge status: Home / Self Care | Payer: Medicare HMO | Source: Ambulatory Visit | Attending: Family Medicine | Admitting: Family Medicine

## 2022-07-30 DIAGNOSIS — M5412 Radiculopathy, cervical region: Secondary | ICD-10-CM

## 2022-07-30 DIAGNOSIS — M4722 Other spondylosis with radiculopathy, cervical region: Secondary | ICD-10-CM | POA: Diagnosis not present

## 2022-07-30 MED ORDER — TRIAMCINOLONE ACETONIDE 40 MG/ML IJ SUSP (RADIOLOGY)
60.0000 mg | Freq: Once | INTRAMUSCULAR | Status: AC
  Administered 2022-07-30: 60 mg via EPIDURAL

## 2022-07-30 MED ORDER — IOPAMIDOL (ISOVUE-M 300) INJECTION 61%
1.0000 mL | Freq: Once | INTRAMUSCULAR | Status: AC | PRN
Start: 2022-07-30 — End: 2022-07-30
  Administered 2022-07-30: 1 mL via EPIDURAL

## 2022-07-30 NOTE — Discharge Instructions (Signed)

## 2022-08-03 ENCOUNTER — Encounter: Payer: Self-pay | Admitting: Family Medicine

## 2022-08-05 ENCOUNTER — Encounter: Payer: Self-pay | Admitting: Family Medicine

## 2022-08-20 ENCOUNTER — Other Ambulatory Visit: Payer: Self-pay | Admitting: Cardiology

## 2022-08-30 ENCOUNTER — Encounter: Payer: Self-pay | Admitting: Cardiology

## 2022-08-30 ENCOUNTER — Ambulatory Visit (INDEPENDENT_AMBULATORY_CARE_PROVIDER_SITE_OTHER): Payer: Medicare HMO

## 2022-08-30 ENCOUNTER — Ambulatory Visit: Payer: Medicare HMO | Attending: Cardiology | Admitting: Cardiology

## 2022-08-30 VITALS — BP 118/68 | HR 58 | Ht 70.0 in | Wt 216.6 lb

## 2022-08-30 DIAGNOSIS — N183 Chronic kidney disease, stage 3 unspecified: Secondary | ICD-10-CM

## 2022-08-30 DIAGNOSIS — I480 Paroxysmal atrial fibrillation: Secondary | ICD-10-CM | POA: Diagnosis not present

## 2022-08-30 DIAGNOSIS — E669 Obesity, unspecified: Secondary | ICD-10-CM | POA: Diagnosis not present

## 2022-08-30 DIAGNOSIS — Z79899 Other long term (current) drug therapy: Secondary | ICD-10-CM | POA: Diagnosis not present

## 2022-08-30 DIAGNOSIS — I10 Essential (primary) hypertension: Secondary | ICD-10-CM

## 2022-08-30 DIAGNOSIS — I2510 Atherosclerotic heart disease of native coronary artery without angina pectoris: Secondary | ICD-10-CM | POA: Diagnosis not present

## 2022-08-30 NOTE — Progress Notes (Signed)
Cardiology Office Note:    Date:  08/30/2022   ID:  Joshua [name], DOB November 28, 1945, MRN [id number]  PCP:  Joshua Nutting, DO  Cardiologist:  Joshua Salines, DO  Electrophysiologist:  None   Referring MD: Joshua Nutting, DO   " I am having shortness of breath"  History of Present Illness:    Joshua [name] is a 76 y.o. male with a hx of  atrial fibrillation status post DC cardioversion on November 29, 2019 and October 15, 2019 now on amiodarone and Eliquis, mild coronary artery disease, recently aorta dilatation. The patient is here today for follow-up visit.   I saw the patient in November 2021 at that time we talked about his stress test which was normal but he had symptoms I therefore proceeded the patient to get a coronary CTA.  He did get his coronary CTA which showed evidence of mild coronary artery disease with proximal ascending aorta dilatation.   I saw the patient the patient on Mar 27, 2021 at that time he appeared to be doing well from a cardiovascular standpoint.  I saw the patient on December 13, 2021 at that time he was planning surgery.  He is here today with his wife.  He is doing well.Marland Kitchen  He is very happy he just completed his therapy and is completely alcohol free.  But tells me that he is short of breath.  Mostly on exertion and usually is transient and resolved.  At times he gets some lightheadedness when he is short of breath.  No other complaints at this time.  Past Medical History:  Diagnosis Date   Abnormal findings on diagnostic imaging of lung 09/23/2019   09/22/2019-lung cancer screening-centrilobular and paraseptal emphysema, calcified granulomata noted bilaterally, no suspicious nodules, lung RADS 1, repeat in 12 months    Anxiety    Atrial fibrillation ([place]) 09/23/2019   Centrilobular emphysema ([place]) 09/23/2019   09/22/2019-lung cancer screening-centrilobular and paraseptal emphysema, calcified granulomata noted bilaterally, no suspicious nodules,  lung RADS 1, repeat in 12 months  09/23/2019-FVC 3.74 (81% predicted), postbronchodilator ratio 80, postbronchodilator FEV1 2.88 (86% predicted), no bronchodilator response, DLCO 19.44 (73% predicted)   DDD (degenerative disc disease), cervical 05/13/2008   Cervical Disc Degeneration   Depression    History of adenomatous polyp of colon 03/03/2018   05/14/17: Colonoscopy: nonadvanced adenoma, f/u 5 yrs, use SuPrep, [place]/GAP  Last Assessment & Plan:  Relevant Hx: Course: Daily Update: Today's Plan:   Hyperlipidemia    Kidney stone    Nephrolithiasis 05/22/2011   Nontraumatic incomplete tear of right rotator cuff 10/14/2018   Other and unspecified hyperlipidemia 11/20/2009   Hyperlipidemia   RBBB 06/09/2012   Secondary hypercoagulable state ([place]) 09/28/2019   Shortness of breath 09/23/2019    Past Surgical History:  Procedure Laterality Date   CARDIOVERSION N/A 10/15/2019   Procedure: CARDIOVERSION;  Surgeon: Joshua Pain, MD;  Location: [facility] ENDOSCOPY;  Service: Cardiovascular;  Laterality: N/A;   CARDIOVERSION N/A 11/29/2019   Procedure: CARDIOVERSION;  Surgeon: Joshua Spark, MD;  Location: [facility] ENDOSCOPY;  Service: Cardiovascular;  Laterality: N/A;   [place] Right 12/21/2021   Procedure: TOTAL HIP ARTHROPLASTY ANTERIOR APPROACH;  Surgeon: Joshua Leitz, MD;  Location: [facility] ORS;  Service: Orthopedics;  Laterality: Right;   VARICOSE VEIN SURGERY Right     Current Medications: Current Meds  Medication Sig   amiodarone (PACERONE) 200 MG tablet TAKE 1/2 TABLET BY MOUTH DAILY  apixaban (ELIQUIS) 5 MG TABS tablet Take 5 mg by mouth 2 (two) times daily.   atorvastatin (LIPITOR) 40 MG tablet TAKE ONE TABLET BY MOUTH DAILY   clobetasol (TEMOVATE) 0.05 % external solution Apply 1 application topically once a week. Use in hair   fluorouracil (EFUDEX) 5 % cream Apply 1 application topically daily as needed (On head over night).   FLUoxetine (PROZAC) 20  MG capsule Take 20 mg by mouth daily.    ketoconazole (NIZORAL) 2 % shampoo Apply 1 application topically 2 (two) times a week. Monday and Thursday   lamoTRIgine (LAMICTAL) 200 MG tablet Take 100 mg by mouth 2 (two) times daily.   naltrexone (DEPADE) 50 MG tablet Take 50 mg by mouth every morning.   polyethylene glycol powder (GLYCOLAX/MIRALAX) 17 GM/SCOOP powder Take 17 g by mouth 2 (two) times daily.   pregabalin (LYRICA) 75 MG capsule Take 1 capsule (75 mg total) by mouth 2 (two) times daily as needed.   tamsulosin (FLOMAX) 0.4 MG CAPS capsule Take 0.4 mg by mouth every other day.     Allergies:   Venlafaxine, Bupropion, Tramadol, and Zoloft [sertraline hcl]   Social History   Socioeconomic History   Marital status: Married    Spouse name: Not on file   Number of children: Not on file   Years of education: Not on file   Highest education level: Not on file  Occupational History   Not on file  Tobacco Use   Smoking status: Former    Packs/day: 2.00    Years: 46.00    Total pack years: 92.00    Types: Cigarettes    Quit date: 2010    Years since quitting: 13.8   Smokeless tobacco: Never   Tobacco comments:    Former smoker 05/02/22  Vaping Use   Vaping Use: Never used  Substance and Sexual Activity   Alcohol use: Not Currently    Alcohol/week: 14.0 standard drinks of alcohol    Types: 14 Cans of beer per week    Comment: stop drinking 44 days ago 05/02/22   Drug use: No   Sexual activity: Yes    Partners: Female  Other Topics Concern   Not on file  Social History Narrative   Not on file   Social Determinants of Health   Financial Resource Strain: Not on file  Food Insecurity: Not on file  Transportation Needs: Not on file  Physical Activity: Not on file  Stress: Not on file  Social Connections: Not on file     Family History: The patient's family history includes Stroke in his father; Varicose Veins in his mother.  ROS:   Review of Systems  Constitution:  Negative for decreased appetite, fever and weight gain.  HENT: Negative for congestion, ear discharge, hoarse voice and sore throat.   Eyes: Negative for discharge, redness, vision loss in right eye and visual halos.  Cardiovascular: Negative for chest [name], dyspnea on exertion, leg swelling, orthopnea and palpitations.  Respiratory: Negative for cough, hemoptysis, shortness of breath and snoring.   Endocrine: Negative for heat intolerance and polyphagia.  Hematologic/Lymphatic: Negative for bleeding problem. Does not bruise/bleed easily.  Skin: Negative for flushing, nail changes, rash and suspicious lesions.  Musculoskeletal: Negative for arthritis, joint [name], muscle cramps, myalgias, neck [name] and stiffness.  Gastrointestinal: Negative for abdominal [name], bowel incontinence, diarrhea and excessive appetite.  Genitourinary: Negative for decreased libido, genital sores and incomplete emptying.  Neurological: Negative for brief paralysis, focal weakness, headaches and  loss of balance.  Psychiatric/Behavioral: Negative for altered mental status, depression and suicidal ideas.  Allergic/Immunologic: Negative for HIV exposure and persistent infections.    EKGs/Labs/Other Studies Reviewed:    The following studies were reviewed today:   EKG: None today   Coronary CTA December 2021 FINDINGS: Coronary calcium score: The patient's coronary artery calcium score is 459, which places the patient in the 55 percentile.   Coronary arteries: Normal coronary origins.  Right dominance.   Right Coronary Artery: Normal caliber vessel, gives rise to PDA. There is a small conus branch. There is a mixed calcified and noncalcified plaque at the proximal portion of the mid RCA with 1-24% stenosis.   Left Main Coronary Artery: Normal caliber vessel. Mixed calcified and noncalcified plaque in the distal left main with 1-24% stenosis.   Left Anterior Descending Coronary Artery: Normal caliber  vessel. There is mixed calcified and noncalcified plaque in the proximal LAD with at least 25-49% stenosis, and higher degree stenosis cannot be excluded. There is noncalcified plaque throughout the proximal LAD, and then there is a mixed calcified and noncalcified plaque proximal to the origin of D1 with at least 25-49% stenosis. There is scattered calcified and noncalcified plaqque throughout the mid LAD with 1-24% stenosis. Gives rise to 1 diagonal branch.   Left Circumflex Artery: Normal caliber vessel. There is mixed calcified and noncalcified plaque in the proximal LCx with 25-49% stenosis. Gives rise to 2 OM branches.   Aorta: Mildly enlarged size, 40 mm at the mid ascending aorta (level of the PA bifurcation) measured double oblique. Scattered calcifications, consistent with aortic atherosclerosis. No dissection.   Aortic Valve: Trivial calcifications. Trileaflet.   Other findings:   Normal pulmonary vein drainage into the left atrium.   Normal left atrial appendage without a thrombus.   Normal size of the pulmonary artery.   Likely small PFO with trivial left to right flow.   IMPRESSION: 1. At least mild nonobstructive CAD, CADRADS = 2. CT FFR will be performed and reported separately. Areas of most concern are in the proximal LAD, with less severe lesions in the proximal LCx and proximal/mid RCA.   2. Coronary calcium score of 459. This was 55th percentile for age and sex matched control.   3.  Normal coronary origin with right dominance.   4.  Aortic atherosclerosis   5.  Likely small PFO with trivial left to right shunt.    Recent Labs: 12/22/2021: Hemoglobin 10.2; Platelets 247 05/02/2022: ALT 36; BUN 14; Creatinine, Ser 1.34; Potassium 4.4; Sodium 139; TSH 1.342  Recent Lipid Panel    Component Value Date/Time   CHOL 141 09/10/2021 0000   CHOL 139 12/10/2019 0948   TRIG 66 09/10/2021 0000   HDL 62 09/10/2021 0000   HDL 47 12/10/2019 0948   CHOLHDL  2.3 09/10/2021 0000   LDLCALC 64 09/10/2021 0000    Physical Exam:    VS:  BP 118/68   Pulse (!) 58   Ht '5\' 10"'$  (1.778 m)   Wt 216 lb 9.6 oz (98.2 kg)   SpO2 91%   BMI 31.08 kg/m     Wt Readings from Last 3 Encounters:  08/30/22 216 lb 9.6 oz (98.2 kg)  07/22/22 219 lb (99.3 kg)  07/18/22 216 lb (98 kg)     GEN: Well nourished, well developed in no acute distress HEENT: Normal NECK: No JVD; No carotid bruits LYMPHATICS: No lymphadenopathy CARDIAC: S1S2 noted,RRR, no murmurs, rubs, gallops RESPIRATORY:  Clear to auscultation without  rales, wheezing or rhonchi  ABDOMEN: Soft, non-tender, non-distended, +bowel sounds, no guarding. EXTREMITIES: No edema, No cyanosis, no clubbing MUSCULOSKELETAL:  No deformity  SKIN: Warm and dry NEUROLOGIC:  Alert and oriented x 3, non-focal PSYCHIATRIC:  Normal affect, good insight  ASSESSMENT:    1. Medication management   2. PAF (paroxysmal atrial fibrillation) ([place])   3. Essential hypertension   4. Mild CAD   5. Stage 3 chronic kidney disease, unspecified whether stage 3a or 3b CKD (HCC)   6. Obesity (BMI 30-39.9)    PLAN:     Shortness of breath-suspect that this may be related to his atrial fibrillation.  He may be intermittently going to A-fib.  I am going to place a monitor on the patient to investigate this suspicion.  We will continue his amiodarone 100 mg daily with his apixaban.  Blood pressure is acceptable, continue with current antihypertensive regimen.  CAD-no angina.  He is going to be getting a CT of the chest with his pulmonary doctor no need to order one from the standpoint here.  The patient is in agreement with the above plan. The patient left the office in stable condition.  The patient will follow up in   Medication Adjustments/Labs and Tests Ordered: Current medicines are reviewed at length with the patient today.  Concerns regarding medicines are outlined above.  Orders Placed This Encounter  Procedures    Basic Metabolic Panel (BMET)   Magnesium   CBC   LONG TERM MONITOR (3-14 DAYS)   No orders of the defined types were placed in this encounter.   Patient Instructions  Medication Instructions:  Your physician recommends that you continue on your current medications as directed. Please refer to the Current Medication list given to you today.  *If you need a refill on your cardiac medications before your next appointment, please call your pharmacy*   Lab Work: Your physician recommends that you have the following labs drawn today: BMET, Magnesium and CBC If you have labs (blood work) drawn today and your tests are completely normal, you will receive your results only by: [place] (if you have MyChart) OR A paper copy in the mail If you have any lab test that is abnormal or we need to change your treatment, we will call you to review the results.   Testing/Procedures: [place]- Long Term Monitor Instructions  Your physician has requested you wear a ZIO patch monitor for 14 days.  This is a single patch monitor. [employer] supplies one patch monitor per enrollment. Additional stickers are not available. Please do not apply patch if you will be having a Nuclear Stress Test,  Echocardiogram, Cardiac CT, MRI, or Chest Xray during the period you would be wearing the  monitor. The patch cannot be worn during these tests. You cannot remove and re-apply the  ZIO XT patch monitor.  Your ZIO patch monitor will be mailed 3 day USPS to your address on file. It may take 3-5 days  to receive your monitor after you have been enrolled.  Once you have received your monitor, please review the enclosed instructions. Your monitor  has already been registered assigning a specific monitor serial # to you.  Billing and Patient Assistance Program Information  We have supplied [employer] with any of your insurance information on file for billing purposes. [employer] offers a sliding scale Patient Assistance  Program for patients that do not have  insurance, or whose insurance does not completely cover the cost of  the ZIO monitor.  You must apply for the Patient Assistance Program to qualify for this discounted rate.  To apply, please call [employer] at [phone], select option 4, select option 2, ask to apply for  Patient Assistance Program. Theodore Demark will ask your household income, and how many people  are in your household. They will quote your out-of-pocket cost based on that information.  [employer] will also be able to set up a 55-month interest-free payment plan if needed.  Applying the monitor   Shave hair from upper left chest.  Hold abrader disc by orange tab. Rub abrader in 40 strokes over the upper left chest as  indicated in your monitor instructions.  Clean area with 4 enclosed alcohol pads. Let dry.  Apply patch as indicated in monitor instructions. Patch will be placed under collarbone on left  side of chest with arrow pointing upward.  Rub patch adhesive wings for 2 minutes. Remove white label marked "1". Remove the white  label marked "2". Rub patch adhesive wings for 2 additional minutes.  While looking in a mirror, press and release button in center of patch. A small green light will  flash 3-4 times. This will be your only indicator that the monitor has been turned on.  Do not shower for the first 24 hours. You may shower after the first 24 hours.  Press the button if you feel a symptom. You will hear a small click. Record Date, Time and  Symptom in the Patient Logbook.  When you are ready to remove the patch, follow instructions on the last 2 pages of Patient  Logbook. Stick patch monitor onto the last page of Patient Logbook.  Place Patient Logbook in the blue and white box. Use locking tab on box and tape box closed  securely. The blue and white box has prepaid postage on it. Please place it in the mailbox as  soon as possible. Your physician should have your test results  approximately 7 days after the  monitor has been mailed back to [facility]  Call ISan Juan Capistranoat [phone] you have questions regarding  your ZIO XT patch monitor. Call them immediately if you see an orange light blinking on your  monitor.  If your monitor falls off in less than 4 days, contact our Monitor department at [phone]  If your monitor becomes loose or falls off after 4 days call [employer] at [phone]  suggestions on securing your monitor    Follow-Up: [facility] you and your health needs are our priority.  As part of our continuing mission to provide you with exceptional heart care, we have created designated Provider Care Teams.  These Care Teams include your primary Cardiologist (physician) and Advanced Practice Providers (APPs -  Physician Assistants and Nurse Practitioners) who all work together to provide you with the care you need, when you need it.  We recommend signing up for the patient portal called "MyChart".  Sign up information is provided on this After Visit Summary.  MyChart is used to connect with patients for Virtual Visits (Telemedicine).  Patients are able to view lab/test results, encounter notes, upcoming appointments, etc.  Non-urgent messages can be sent to your provider as well.   To learn more about what you can do with MyChart, go to [email or web address]    Your next appointment:   6 month(s)  The format for your next appointment:   In Person  Provider:   KBerniece Salines DO  Adopting a Healthy Lifestyle.  Know what a healthy weight is for you (roughly BMI <25) and aim to maintain this   Aim for 7+ servings of fruits and vegetables daily   65-80+ fluid ounces of water or unsweet tea for healthy kidneys   Limit to max 1 drink of alcohol per day; avoid smoking/tobacco   Limit animal fats in diet for cholesterol and heart health - choose grass fed whenever available   Avoid highly  processed foods, and foods high in saturated/trans fats   Aim for low stress - take time to unwind and care for your mental health   Aim for 150 min of moderate intensity exercise weekly for heart health, and weights twice weekly for bone health   Aim for 7-9 hours of sleep daily   When it comes to diets, agreement about the perfect plan isnt easy to find, even among the experts. Experts at the [employer] developed an idea known as the Healthy Eating Plate. Just imagine a plate divided into logical, healthy portions.   The emphasis is on diet quality:   Load up on vegetables and fruits - one-half of your plate: Aim for color and variety, and remember that potatoes dont count.   Go for whole grains - one-quarter of your plate: Whole wheat, barley, wheat berries, quinoa, oats, brown rice, and foods made with them. If you want pasta, go with whole wheat pasta.   Protein power - one-quarter of your plate: Fish, chicken, beans, and nuts are all healthy, versatile protein sources. Limit red meat.   The diet, however, does go beyond the plate, offering a few other suggestions.   Use healthy plant oils, such as olive, canola, soy, corn, sunflower and peanut. Check the labels, and avoid partially hydrogenated oil, which have unhealthy trans fats.   If youre thirsty, drink water. Coffee and tea are good in moderation, but skip sugary drinks and limit milk and dairy products to one or two daily servings.   The type of carbohydrate in the diet is more important than the amount. Some sources of carbohydrates, such as vegetables, fruits, whole grains, and beans-are healthier than others.   Finally, stay active  Signed, Joshua Salines, DO  08/30/2022 8:59 AM    [place]

## 2022-08-30 NOTE — Progress Notes (Unsigned)
Enrolled patient for a 14 day Zio XT  monitor to be mailed to patients home  °

## 2022-08-30 NOTE — Patient Instructions (Signed)
Medication Instructions:  Your physician recommends that you continue on your current medications as directed. Please refer to the Current Medication list given to you today.  *If you need a refill on your cardiac medications before your next appointment, please call your pharmacy*   Lab Work: Your physician recommends that you have the following labs drawn today: BMET, Magnesium and CBC If you have labs (blood work) drawn today and your tests are completely normal, you will receive your results only by: [place] (if you have MyChart) OR A paper copy in the mail If you have any lab test that is abnormal or we need to change your treatment, we will call you to review the results.   Testing/Procedures: [place]- Long Term Monitor Instructions  Your physician has requested you wear a ZIO patch monitor for 14 days.  This is a single patch monitor. [employer] supplies one patch monitor per enrollment. Additional stickers are not available. Please do not apply patch if you will be having a Nuclear Stress Test,  Echocardiogram, Cardiac CT, MRI, or Chest Xray during the period you would be wearing the  monitor. The patch cannot be worn during these tests. You cannot remove and re-apply the  ZIO XT patch monitor.  Your ZIO patch monitor will be mailed 3 day USPS to your address on file. It may take 3-5 days  to receive your monitor after you have been enrolled.  Once you have received your monitor, please review the enclosed instructions. Your monitor  has already been registered assigning a specific monitor serial # to you.  Billing and Patient Assistance Program Information  We have supplied [employer] with any of your insurance information on file for billing purposes. [employer] offers a sliding scale Patient Assistance Program for patients that do not have  insurance, or whose insurance does not completely cover the cost of the ZIO monitor.  You must apply for the Patient Assistance Program  to qualify for this discounted rate.  To apply, please call [employer] at [phone], select option 4, select option 2, ask to apply for  Patient Assistance Program. Theodore Demark will ask your household income, and how many people  are in your household. They will quote your out-of-pocket cost based on that information.  [employer] will also be able to set up a 69-month interest-free payment plan if needed.  Applying the monitor   Shave hair from upper left chest.  Hold abrader disc by orange tab. Rub abrader in 40 strokes over the upper left chest as  indicated in your monitor instructions.  Clean area with 4 enclosed alcohol pads. Let dry.  Apply patch as indicated in monitor instructions. Patch will be placed under collarbone on left  side of chest with arrow pointing upward.  Rub patch adhesive wings for 2 minutes. Remove white label marked "1". Remove the white  label marked "2". Rub patch adhesive wings for 2 additional minutes.  While looking in a mirror, press and release button in center of patch. A small green light will  flash 3-4 times. This will be your only indicator that the monitor has been turned on.  Do not shower for the first 24 hours. You may shower after the first 24 hours.  Press the button if you feel a symptom. You will hear a small click. Record Date, Time and  Symptom in the Patient Logbook.  When you are ready to remove the patch, follow instructions on the last 2 pages of Patient  Logbook. Stick  patch monitor onto the last page of Patient Logbook.  Place Patient Logbook in the blue and white box. Use locking tab on box and tape box closed  securely. The blue and white box has prepaid postage on it. Please place it in the mailbox as  soon as possible. Your physician should have your test results approximately 7 days after the  monitor has been mailed back to [facility].  Call Reader at [phone] if you have questions regarding  your ZIO XT  patch monitor. Call them immediately if you see an orange light blinking on your  monitor.  If your monitor falls off in less than 4 days, contact our Monitor department at [phone].  If your monitor becomes loose or falls off after 4 days call [employer] at [phone] for  suggestions on securing your monitor    Follow-Up: At [facility], you and your health needs are our priority.  As part of our continuing mission to provide you with exceptional heart care, we have created designated Provider Care Teams.  These Care Teams include your primary Cardiologist (physician) and Advanced Practice Providers (APPs -  Physician Assistants and Nurse Practitioners) who all work together to provide you with the care you need, when you need it.  We recommend signing up for the patient portal called "MyChart".  Sign up information is provided on this After Visit Summary.  MyChart is used to connect with patients for Virtual Visits (Telemedicine).  Patients are able to view lab/test results, encounter notes, upcoming appointments, etc.  Non-urgent messages can be sent to your provider as well.   To learn more about what you can do with MyChart, go to [email or web address].    Your next appointment:   6 month(s)  The format for your next appointment:   In Person  Provider:   Berniece Salines, DO

## 2022-08-31 LAB — CBC
Hematocrit: 38 % (ref 37.5–51.0)
Hemoglobin: 12.6 g/dL — ABNORMAL LOW (ref 13.0–17.7)
MCH: 32.1 pg (ref 26.6–33.0)
MCHC: 33.2 g/dL (ref 31.5–35.7)
MCV: 97 fL (ref 79–97)
Platelets: 226 10*3/uL (ref 150–450)
RBC: 3.92 x10E6/uL — ABNORMAL LOW (ref 4.14–5.80)
RDW: 14 % (ref 11.6–15.4)
WBC: 3.8 10*3/uL (ref 3.4–10.8)

## 2022-08-31 LAB — BASIC METABOLIC PANEL
BUN/Creatinine Ratio: 16 (ref 10–24)
BUN: 21 mg/dL (ref 8–27)
CO2: 25 mmol/L (ref 20–29)
Calcium: 9.6 mg/dL (ref 8.6–10.2)
Chloride: 102 mmol/L (ref 96–106)
Creatinine, Ser: 1.29 mg/dL — ABNORMAL HIGH (ref 0.76–1.27)
Glucose: 91 mg/dL (ref 70–99)
Potassium: 4.3 mmol/L (ref 3.5–5.2)
Sodium: 139 mmol/L (ref 134–144)
eGFR: 58 mL/min/{1.73_m2} — ABNORMAL LOW (ref >59)

## 2022-08-31 LAB — MAGNESIUM: Magnesium: 2.1 mg/dL (ref 1.6–2.3)

## 2022-09-03 ENCOUNTER — Telehealth: Payer: Self-pay | Admitting: Cardiology

## 2022-09-03 ENCOUNTER — Ambulatory Visit
Admission: RE | Admit: 2022-09-03 | Discharge: 2022-09-03 | Disposition: A | Discharge status: Home / Self Care | Payer: Medicare HMO | Source: Ambulatory Visit | Attending: Pulmonary Disease | Admitting: Pulmonary Disease

## 2022-09-03 DIAGNOSIS — J432 Centrilobular emphysema: Secondary | ICD-10-CM

## 2022-09-03 DIAGNOSIS — R0602 Shortness of breath: Secondary | ICD-10-CM

## 2022-09-03 DIAGNOSIS — I700 Atherosclerosis of aorta: Secondary | ICD-10-CM | POA: Diagnosis not present

## 2022-09-03 NOTE — Telephone Encounter (Signed)
Sent mychart message with lab results.   Thanks!

## 2022-09-03 NOTE — Telephone Encounter (Signed)
  Pt is returning call, he said, he's trying to get ready for his PET scan today and just send him a mychart message for his lab result

## 2022-09-12 DIAGNOSIS — I480 Paroxysmal atrial fibrillation: Secondary | ICD-10-CM | POA: Diagnosis not present

## 2022-09-16 ENCOUNTER — Telehealth: Payer: Self-pay | Admitting: Family Medicine

## 2022-09-16 ENCOUNTER — Other Ambulatory Visit: Payer: Self-pay

## 2022-09-16 DIAGNOSIS — M5412 Radiculopathy, cervical region: Secondary | ICD-10-CM

## 2022-09-16 NOTE — Telephone Encounter (Signed)
Patient called stating that he is having reoccuing shoulder pain and would like an order for another epidural sent over to [facility] if possible.  Please advise.

## 2022-09-16 NOTE — Telephone Encounter (Signed)
Just an FYI: called and spoke to pt to clarify if he was requesting a repeat ESI or for you to do a Peebles injection. Pt reports he is have both pain within his shoulder and radiating pain from his neck to his elbow. We discussed options and I ordered the repeat cervical ESI to treat the radicular symptoms he's currently experiencing and he's going to let us know after the ESI if his shoulder pain cont.

## 2022-10-01 ENCOUNTER — Ambulatory Visit
Admission: RE | Admit: 2022-10-01 | Discharge: 2022-10-01 | Disposition: A | Discharge status: Home / Self Care | Payer: Medicare HMO | Source: Ambulatory Visit | Attending: Family Medicine | Admitting: Family Medicine

## 2022-10-01 DIAGNOSIS — M4722 Other spondylosis with radiculopathy, cervical region: Secondary | ICD-10-CM | POA: Diagnosis not present

## 2022-10-01 DIAGNOSIS — M5412 Radiculopathy, cervical region: Secondary | ICD-10-CM

## 2022-10-01 MED ORDER — IOPAMIDOL (ISOVUE-M 300) INJECTION 61%
1.0000 mL | Freq: Once | INTRAMUSCULAR | Status: AC | PRN
Start: 2022-10-01 — End: 2022-10-01
  Administered 2022-10-01: 1 mL via EPIDURAL

## 2022-10-01 MED ORDER — TRIAMCINOLONE ACETONIDE 40 MG/ML IJ SUSP (RADIOLOGY)
60.0000 mg | Freq: Once | INTRAMUSCULAR | Status: AC
  Administered 2022-10-01: 60 mg via EPIDURAL

## 2022-10-01 NOTE — Discharge Instructions (Addendum)
Post Procedure Spinal Discharge Instruction Sheet  You may resume a regular diet and any medications that you routinely take (including pain medications) unless otherwise noted by MD.  No driving day of procedure.  Light activity throughout the rest of the day.  Do not do any strenuous work, exercise, bending or lifting.  The day following the procedure, you can resume normal physical activity but you should refrain from exercising or physical therapy for at least three days thereafter.  You may apply ice to the injection site, 20 minutes on, 20 minutes off, as needed. Do not apply ice directly to skin.    Common Side Effects:  Headaches- take your usual medications as directed by your physician.  Increase your fluid intake.  Caffeinated beverages may be helpful.  Lie flat in bed until your headache resolves.  Restlessness or inability to sleep- you may have trouble sleeping for the next few days.  Ask your referring physician if you need any medication for sleep.  Facial flushing or redness- should subside within a few days.  Increased pain- a temporary increase in pain a day or two following your procedure is not unusual.  Take your pain medication as prescribed by your referring physician.  Leg cramps  Please contact our office at [phone] for the following symptoms: Fever greater than 100 degrees. Headaches unresolved with medication after 2-3 days. Increased swelling, pain, or redness at injection site.   Thank you for visiting [facility] today.   YOU MAY RESUME YOUR ELIQUIS IN 24 HRS FROM INJECTION.

## 2022-10-10 ENCOUNTER — Ambulatory Visit (INDEPENDENT_AMBULATORY_CARE_PROVIDER_SITE_OTHER): Payer: Medicare HMO | Admitting: Pulmonary Disease

## 2022-10-10 ENCOUNTER — Ambulatory Visit: Payer: Medicare HMO | Admitting: Pulmonary Disease

## 2022-10-10 ENCOUNTER — Encounter: Payer: Self-pay | Admitting: Pulmonary Disease

## 2022-10-10 VITALS — BP 110/70 | HR 71 | Temp 97.8°F | Ht 71.0 in | Wt 221.0 lb

## 2022-10-10 DIAGNOSIS — G4733 Obstructive sleep apnea (adult) (pediatric): Secondary | ICD-10-CM

## 2022-10-10 DIAGNOSIS — J432 Centrilobular emphysema: Secondary | ICD-10-CM

## 2022-10-10 DIAGNOSIS — R0602 Shortness of breath: Secondary | ICD-10-CM

## 2022-10-10 LAB — PULMONARY FUNCTION TEST
DL/VA % pred: 87 %
DL/VA: 3.46 ml/min/mmHg/L
DLCO cor % pred: 80 %
DLCO cor: 20.93 ml/min/mmHg
DLCO unc % pred: 75 %
DLCO unc: 19.65 ml/min/mmHg
FEF 25-75 Post: 3.01 L/sec
FEF 25-75 Pre: 2.14 L/sec
FEF2575-%Change-Post: 41 %
FEF2575-%Pred-Post: 127 %
FEF2575-%Pred-Pre: 90 %
FEV1-%Change-Post: 5 %
FEV1-%Pred-Post: 96 %
FEV1-%Pred-Pre: 90 %
FEV1-Post: 3.1 L
FEV1-Pre: 2.93 L
FEV1FVC-%Change-Post: 5 %
FEV1FVC-%Pred-Pre: 103 %
FEV6-%Change-Post: 1 %
FEV6-%Pred-Post: 92 %
FEV6-%Pred-Pre: 91 %
FEV6-Post: 3.88 L
FEV6-Pre: 3.83 L
FEV6FVC-%Change-Post: 1 %
FEV6FVC-%Pred-Post: 106 %
FEV6FVC-%Pred-Pre: 105 %
FVC-%Change-Post: 0 %
FVC-%Pred-Post: 87 %
FVC-%Pred-Pre: 87 %
FVC-Post: 3.89 L
FVC-Pre: 3.87 L
Post FEV1/FVC ratio: 80 %
Post FEV6/FVC ratio: 100 %
Pre FEV1/FVC ratio: 76 %
Pre FEV6/FVC Ratio: 99 %
RV % pred: 103 %
RV: 2.67 L
TLC % pred: 93 %
TLC: 6.8 L

## 2022-10-10 NOTE — Progress Notes (Signed)
PFT done today. 

## 2022-10-10 NOTE — Patient Instructions (Signed)
I will see you in 6 months from here  Continue inhaler  Continue graded exercise as tolerated  Call us with significant concerns

## 2022-10-10 NOTE — Progress Notes (Signed)
Joshua [name] [place] III    [id number]    12/28/1945  Primary Care Physician:Matthews, Einar [place]heasant, DO  Referring Physician: [name], [address] [place],  [place] [address]  Chief complaint:   History of obstructive sleep apnea  HPI:  Moderate obstructive sleep apnea Intolerance of CPAP  Feels his sleeping is better Wakes up feeling like he is at a good nights rest -He does not want any further evaluation for sleep disordered breathing  History of atrial fibrillation for which she had an ablation  Abnormal PFT which she had repeated showing stable numbers   History of atrial fibrillation for which he had ablation, is feeling much better following that  I did review previous pulmonary function test which was normal, a previous CT scan of the chest and a cardiac imaging CT does show some interstitial changes most prominent at the left base  Gives a history of having hemoptysis about 20-25 years ago for which she had extensive work-up that was unrevealing  Quit smoking over 20 years ago  He feels his sleep is much better than what it has been-he does not believe he would benefit from any evaluation for sleep apnea at present  Usually tries to go to bed between 12 and 2 AM Takes him about 5 minutes to fall asleep Final awakening time about 9 AM Wakes up about 0-2 times during the night to use the bathroom  No dryness of his mouth in the morning Memory is good-long-term memory is excellent, short-term memory can be spotty No headaches in the mornings  Unaware of any family history of obstructive sleep apnea   Outpatient Encounter Medications as of 10/10/2022  Medication Sig   amiodarone (PACERONE) 200 MG tablet TAKE 1/2 TABLET BY MOUTH DAILY   apixaban (ELIQUIS) 5 MG TABS tablet Take 5 mg by mouth 2 (two) times daily.   atorvastatin (LIPITOR) 40 MG tablet TAKE ONE TABLET BY MOUTH DAILY   clobetasol (TEMOVATE) 0.05 % external solution  Apply 1 application topically once a week. Use in hair   Docusate Sodium (COLACE PO) Take 1 tablet by mouth in the morning and at bedtime.   fluorouracil (EFUDEX) 5 % cream Apply 1 application topically daily as needed (On head over night).   FLUoxetine (PROZAC) 20 MG capsule Take 20 mg by mouth daily.    FLUZONE HIGH-DOSE QUADRIVALENT 0.7 ML SUSY    ketoconazole (NIZORAL) 2 % shampoo Apply 1 application topically 2 (two) times a week. Monday and Thursday   lamoTRIgine (LAMICTAL) 200 MG tablet Take 100 mg by mouth 2 (two) times daily.   naltrexone (DEPADE) 50 MG tablet Take 50 mg by mouth every morning.   pregabalin (LYRICA) 75 MG capsule Take 1 capsule (75 mg total) by mouth 2 (two) times daily as needed.   tamsulosin (FLOMAX) 0.4 MG CAPS capsule Take 0.4 mg by mouth every other day.   [DISCONTINUED] polyethylene glycol powder (GLYCOLAX/MIRALAX) 17 GM/SCOOP powder Take 17 g by mouth 2 (two) times daily.   No facility-administered encounter medications on file as of 10/10/2022.    Allergies as of 10/10/2022 - Review Complete 10/10/2022  Allergen Reaction Noted   Venlafaxine Palpitations 07/08/2013   Bupropion Other (See Comments) 05/14/2011   Tramadol Other (See Comments) 11/14/2021   Zoloft [sertraline hcl] Other (See Comments) 05/14/2011    Past Medical History:  Diagnosis Date   Abnormal findings on diagnostic imaging of lung 09/23/2019  09/22/2019-lung cancer screening-centrilobular and paraseptal emphysema, calcified granulomata noted bilaterally, no suspicious nodules, lung RADS 1, repeat in 12 months    Anxiety    Atrial fibrillation ([place]) 09/23/2019   Centrilobular emphysema ([place]) 09/23/2019   09/22/2019-lung cancer screening-centrilobular and paraseptal emphysema, calcified granulomata noted bilaterally, no suspicious nodules, lung RADS 1, repeat in 12 months  09/23/2019-FVC 3.74 (81% predicted), postbronchodilator ratio 80, postbronchodilator FEV1 2.88 (86% predicted), no  bronchodilator response, DLCO 19.44 (73% predicted)   DDD (degenerative disc disease), cervical 05/13/2008   Cervical Disc Degeneration   Depression    History of adenomatous polyp of colon 03/03/2018   05/14/17: Colonoscopy: nonadvanced adenoma, f/u 5 yrs, use SuPrep, [place]/GAP  Last Assessment & Plan:  Relevant Hx: Course: Daily Update: Today's Plan:   Hyperlipidemia    Kidney stone    Nephrolithiasis 05/22/2011   Nontraumatic incomplete tear of right rotator cuff 10/14/2018   Other and unspecified hyperlipidemia 11/20/2009   Hyperlipidemia   RBBB 06/09/2012   Secondary hypercoagulable state ([place]) 09/28/2019   Shortness of breath 09/23/2019    Past Surgical History:  Procedure Laterality Date   CARDIOVERSION N/A 10/15/2019   Procedure: CARDIOVERSION;  Surgeon: Jerline Pain, MD;  Location[facility] ENDOSCOPY;  Service: Cardiovascular;  Laterality: N/A;   CARDIOVERSION N/A 11/29/2019   Procedure: CARDIOVERSION;  Surgeon: Dorothy Spark, MD;  Location: [facility] ENDOSCOPY;  Service: Cardiovascular;  Laterality: N/A;   [place] Right 12/21/2021   Procedure: TOTAL HIP ARTHROPLASTY ANTERIOR APPROACH;  Surgeon: Dorna Leitz, MD;  Location: [facility] ORS;  Service: Orthopedics;  Laterality: Right;   VARICOSE VEIN SURGERY Right     Family History  Problem Relation Age of Onset   Varicose Veins Mother    Stroke Father     Social History   Socioeconomic History   Marital status: Married    Spouse name: Not on file   Number of children: Not on file   Years of education: Not on file   Highest education level: Not on file  Occupational History   Not on file  Tobacco Use   Smoking status: Former    Packs/day: 2.00    Years: 46.00    Total pack years: 92.00    Types: Cigarettes    Quit date: 2010    Years since quitting: 13.9   Smokeless tobacco: Never   Tobacco comments:    Former smoker 05/02/22  Vaping Use   Vaping Use: Never used  Substance and  Sexual Activity   Alcohol use: Not Currently    Alcohol/week: 14.0 standard drinks of alcohol    Types: 14 Cans of beer per week    Comment: stop drinking 44 days ago 05/02/22   Drug use: No   Sexual activity: Yes    Partners: Female  Other Topics Concern   Not on file  Social History Narrative   Not on file   Social Determinants of Health   Financial Resource Strain: Not on file  Food Insecurity: Not on file  Transportation Needs: Not on file  Physical Activity: Not on file  Stress: Not on file  Social Connections: Not on file  Intimate Partner Violence: Not on file    Review of Systems  Respiratory:  Positive for apnea.   Psychiatric/Behavioral:  Positive for sleep disturbance.     Vitals:   10/10/22 1025  BP: 110/70  Pulse: 71  Temp: 97.8 F (36.6 C)  SpO2: 98%    Physical Exam  Constitutional:      Appearance: He is well-developed. He is obese.  HENT:     Head: Normocephalic and atraumatic.     Mouth/Throat:     Mouth: Mucous membranes are moist.  Eyes:     Pupils: Pupils are equal, round, and reactive to light.  Neck:     Thyroid: No thyromegaly.  Cardiovascular:     Rate and Rhythm: Normal rate and regular rhythm.  Pulmonary:     Effort: Pulmonary effort is normal. No respiratory distress.     Breath sounds: Normal breath sounds. No stridor. No wheezing, rhonchi or rales.  Musculoskeletal:     Cervical back: No rigidity or tenderness.  Neurological:     Mental Status: He is alert.  Psychiatric:        Mood and Affect: Mood normal.    Data Reviewed: Sleep study from 2021 reviewed showing moderate obstructive sleep apnea with AHI of 22  CT scan of the chest performed in 2023 reviewed with the patient showing stable findings at the left base  Assessment:   .  Moderate obstructive sleep apnea -Not interested in any other for liberalization for sleep disordered breathing -Feels relatively well  Obesity  CT scan showing prominent interstitial  changes at the left base -Repeat CT shows stable findings  Emphysema  Plan/Recommendations: Follow-up in 6 months  Encouraged to continue with usual activities   Encouraged to give [place] a call if he has any concerns  Sherrilyn Rist MD [facility] 10/10/2022, 10:41 AM  CC: [name], DO

## 2022-10-11 DIAGNOSIS — I480 Paroxysmal atrial fibrillation: Secondary | ICD-10-CM | POA: Diagnosis not present

## 2022-10-30 ENCOUNTER — Telehealth: Payer: Self-pay | Admitting: Cardiology

## 2022-10-30 NOTE — Telephone Encounter (Signed)
[place] with Dr. Harriet Masson to double book. Made appointment with patient for 1/10 at 8 AM.

## 2022-10-30 NOTE — Telephone Encounter (Signed)
LMTCB

## 2022-10-30 NOTE — Telephone Encounter (Signed)
Patient returned my call. He had to cancel 12/28 appointment with Dr. Harriet Masson due to dental appointment. He reported that he is available to see Dr. Harriet Masson the week on Jan 8-12. Made an appointment with Dr. Harriet Masson for 2/15. Waiting on secure chat with Dr. Harriet Masson regarding appointment to discuss monitor results (double book or not).

## 2022-10-30 NOTE — Telephone Encounter (Signed)
  Pt said, he has a dental appt tomorrow and cancelled her appt with Dr. Harriet Masson. He wants to ask Dr. Harriet Masson if he can wait til February to discuss his result

## 2022-10-31 ENCOUNTER — Ambulatory Visit: Payer: Medicare HMO | Admitting: Cardiology

## 2022-11-12 DIAGNOSIS — H35342 Macular cyst, hole, or pseudohole, left eye: Secondary | ICD-10-CM | POA: Diagnosis not present

## 2022-11-12 DIAGNOSIS — H25813 Combined forms of age-related cataract, bilateral: Secondary | ICD-10-CM | POA: Diagnosis not present

## 2022-11-12 DIAGNOSIS — H35372 Puckering of macula, left eye: Secondary | ICD-10-CM | POA: Diagnosis not present

## 2022-11-12 DIAGNOSIS — H43822 Vitreomacular adhesion, left eye: Secondary | ICD-10-CM | POA: Diagnosis not present

## 2022-11-12 DIAGNOSIS — H526 Other disorders of refraction: Secondary | ICD-10-CM | POA: Diagnosis not present

## 2022-11-13 ENCOUNTER — Encounter: Payer: Self-pay | Admitting: Cardiology

## 2022-11-13 ENCOUNTER — Ambulatory Visit: Payer: Medicare HMO | Attending: Cardiology | Admitting: Cardiology

## 2022-11-13 VITALS — BP 106/62 | HR 85 | Ht 70.0 in | Wt 220.4 lb

## 2022-11-13 DIAGNOSIS — N1831 Chronic kidney disease, stage 3a: Secondary | ICD-10-CM

## 2022-11-13 DIAGNOSIS — E782 Mixed hyperlipidemia: Secondary | ICD-10-CM | POA: Diagnosis not present

## 2022-11-13 DIAGNOSIS — I2510 Atherosclerotic heart disease of native coronary artery without angina pectoris: Secondary | ICD-10-CM

## 2022-11-13 DIAGNOSIS — I10 Essential (primary) hypertension: Secondary | ICD-10-CM

## 2022-11-13 DIAGNOSIS — I480 Paroxysmal atrial fibrillation: Secondary | ICD-10-CM

## 2022-11-13 MED ORDER — METOPROLOL SUCCINATE ER 25 MG PO TB24: 12.5000 mg | ORAL_TABLET | Freq: Every day | ORAL | 3 refills | Status: DC

## 2022-11-13 MED ORDER — FUROSEMIDE 20 MG PO TABS: 20.0000 mg | ORAL_TABLET | Freq: Every day | ORAL | 0 refills | Status: DC

## 2022-11-13 MED ORDER — POTASSIUM CHLORIDE ER 10 MEQ PO TBCR: 10.0000 meq | EXTENDED_RELEASE_TABLET | Freq: Every day | ORAL | 0 refills | Status: DC

## 2022-11-13 NOTE — Patient Instructions (Signed)
Medication Instructions:  Your physician has recommended you make the following change in your medication:  START: Toprol 12.'5mg'$  daily at 11am START: Lasix '20mg'$  daily for the next 5 days at 1pm START: Potassium 3mq's daily for the next 5 days at 1pm with the lasix  *If you need a refill on your cardiac medications before your next appointment, please call your pharmacy*   Lab Work: NONE If you have labs (blood work) drawn today and your tests are completely normal, you will receive your results only by: [place](if you have MyChart) OR A paper copy in the mail If you have any lab test that is abnormal or we need to change your treatment, we will call you to review the results.   Testing/Procedures: NONE   Follow-Up: [facility] and your [facility] needs are our [facility].  As part of our continuing mission to provide you with exceptional heart care, we have created designated Provider Care Teams.  These Care Teams include your primary Cardiologist (physician) and Advanced Practice Providers (APPs -  Physician Assistants and Nurse Practitioners) who all work together to provide you with the care you need, when you need it.  We recommend signing up for the patient portal called "MyChart".  Sign up information is provided on this After Visit Summary.  MyChart is used to connect with patients for Virtual Visits (Telemedicine).  Patients are able to view lab/test results, encounter notes, upcoming appointments, etc.  Non-urgent messages can be sent to your provider as well.   To learn more about what you can do with MyChart, go to [email or web address]    Your next appointment:   4 month(s)  The format for your next appointment:   In Person  Provider:   KBerniece Salines DO

## 2022-11-13 NOTE — Progress Notes (Signed)
Cardiology Office Note:    Date:  11/14/2022   ID:  Joshua [name], DOB 08-16-1946, MRN [id number]  PCP:  [name], DO  Cardiologist:  Berniece Salines, DO  Electrophysiologist:  None   Referring MD: [name], DO   " I am having shortness of breath"  History of Present Illness:    Joshua [name] is a 77 y.o. male with a hx of  atrial fibrillation status post DC cardioversion on November 29, 2019 and October 15, 2019 now on amiodarone and Eliquis, mild coronary artery disease, recently aorta dilatation. The patient is here today for follow-up visit.   I saw the patient in November 2021 at that time we talked about his stress test which was normal but he had symptoms I therefore proceeded the patient to get a coronary CTA.  He did get his coronary CTA which showed evidence of mild coronary artery disease with proximal ascending aorta dilatation.   I saw the patient the patient on Mar 27, 2021 at that time he appeared to be doing well from a cardiovascular standpoint.  I saw the patient on December 13, 2021 at that time he was planning surgery.   At his last visit in October 2023 at that time he was experiencing some shortness of breath suspect intermittent atrial fibrillation as that corporate here.  Placed a monitor on the patient.  He did wear his monitor.  Is here today to discuss result.   Past Medical History:  Diagnosis Date   Abnormal findings on diagnostic imaging of lung 09/23/2019   09/22/2019-lung cancer screening-centrilobular and paraseptal emphysema, calcified granulomata noted bilaterally, no suspicious nodules, lung RADS 1, repeat in 12 months    Anxiety    Atrial fibrillation ([place]) 09/23/2019   Centrilobular emphysema ([place]) 09/23/2019   09/22/2019-lung cancer screening-centrilobular and paraseptal emphysema, calcified granulomata noted bilaterally, no suspicious nodules, lung RADS 1, repeat in 12 months  09/23/2019-FVC 3.74 (81% predicted),  postbronchodilator ratio 80, postbronchodilator FEV1 2.88 (86% predicted), no bronchodilator response, DLCO 19.44 (73% predicted)   DDD (degenerative disc disease), cervical 05/13/2008   Cervical Disc Degeneration   Depression    History of adenomatous polyp of colon 03/03/2018   05/14/17: Colonoscopy: nonadvanced adenoma, f/u 5 yrs, use SuPrep, [place]/GAP  Last Assessment & Plan:  Relevant Hx: Course: Daily Update: Today's Plan:   Hyperlipidemia    Kidney stone    Nephrolithiasis 05/22/2011   Nontraumatic incomplete tear of right rotator cuff 10/14/2018   Other and unspecified hyperlipidemia 11/20/2009   Hyperlipidemia   RBBB 06/09/2012   Secondary hypercoagulable state ([place]) 09/28/2019   Shortness of breath 09/23/2019    Past Surgical History:  Procedure Laterality Date   CARDIOVERSION N/A 10/15/2019   Procedure: CARDIOVERSION;  Surgeon: Jerline Pain, MD;  Location: [facility] ENDOSCOPY;  Service: Cardiovascular;  Laterality: N/A;   CARDIOVERSION N/A 11/29/2019   Procedure: CARDIOVERSION;  Surgeon: Dorothy Spark, MD;  Location: [facility] ENDOSCOPY;  Service: Cardiovascular;  Laterality: N/A;   [place] Right 12/21/2021   Procedure: TOTAL HIP ARTHROPLASTY ANTERIOR APPROACH;  Surgeon: Dorna Leitz, MD;  Location: [facility] ORS;  Service: Orthopedics;  Laterality: Right;   VARICOSE VEIN SURGERY Right     Current Medications: Current Meds  Medication Sig   amiodarone (PACERONE) 200 MG tablet TAKE 1/2 TABLET BY MOUTH DAILY   apixaban (ELIQUIS) 5 MG TABS tablet Take 5 mg by mouth 2 (two) times daily.   atorvastatin (LIPITOR)  40 MG tablet TAKE ONE TABLET BY MOUTH DAILY   clobetasol (TEMOVATE) 0.05 % external solution Apply 1 application topically once a week. Use in hair   Docusate Sodium (COLACE PO) Take 1 tablet by mouth in the morning and at bedtime.   fluorouracil (EFUDEX) 5 % cream Apply 1 application topically daily as needed (On head over night).    FLUoxetine (PROZAC) 40 MG capsule Take 40 mg by mouth daily.   furosemide (LASIX) 20 MG tablet Take 1 tablet (20 mg total) by mouth daily.   ketoconazole (NIZORAL) 2 % shampoo Apply 1 application topically 2 (two) times a week. Monday and Thursday   lamoTRIgine (LAMICTAL) 200 MG tablet Take 100 mg by mouth 2 (two) times daily.   metoprolol succinate (TOPROL XL) 25 MG 24 hr tablet Take 0.5 tablets (12.5 mg total) by mouth daily.   naltrexone (DEPADE) 50 MG tablet Take 50 mg by mouth every morning.   potassium chloride (KLOR-CON) 10 MEQ tablet Take 1 tablet (10 mEq total) by mouth daily.   pregabalin (LYRICA) 75 MG capsule Take 1 capsule (75 mg total) by mouth 2 (two) times daily as needed.   tamsulosin (FLOMAX) 0.4 MG CAPS capsule Take 0.4 mg by mouth every other day.     Allergies:   Venlafaxine, Bupropion, Tramadol, and Zoloft [sertraline hcl]   Social History   Socioeconomic History   Marital status: Married    Spouse name: Not on file   Number of children: Not on file   Years of education: Not on file   Highest education level: Not on file  Occupational History   Not on file  Tobacco Use   Smoking status: Former    Packs/day: 2.00    Years: 46.00    Total pack years: 92.00    Types: Cigarettes    Quit date: 2010    Years since quitting: 14.0   Smokeless tobacco: Never   Tobacco comments:    Former smoker 05/02/22  Vaping Use   Vaping Use: Never used  Substance and Sexual Activity   Alcohol use: Not Currently    Alcohol/week: 14.0 standard drinks of alcohol    Types: 14 Cans of beer per week    Comment: stop drinking 44 days ago 05/02/22   Drug use: No   Sexual activity: Yes    Partners: Female  Other Topics Concern   Not on file  Social History Narrative   Not on file   Social Determinants of Health   Financial Resource Strain: Not on file  Food Insecurity: Not on file  Transportation Needs: Not on file  Physical Activity: Not on file  Stress: Not on file   Social Connections: Not on file     Family History: The patient's family history includes Stroke in his father; Varicose Veins in his mother.  ROS:   Review of Systems  Constitution: Negative for decreased appetite, fever and weight gain.  HENT: Negative for congestion, ear discharge, hoarse voice and sore throat.   Eyes: Negative for discharge, redness, vision loss in right eye and visual halos.  Cardiovascular: Negative for chest pain, dyspnea on exertion, leg swelling, orthopnea and palpitations.  Respiratory: Negative for cough, hemoptysis, shortness of breath and snoring.   Endocrine: Negative for heat intolerance and polyphagia.  Hematologic/Lymphatic: Negative for bleeding problem. Does not bruise/bleed easily.  Skin: Negative for flushing, nail changes, rash and suspicious lesions.  Musculoskeletal: Negative for arthritis, joint pain, muscle cramps, myalgias, neck pain and stiffness.  Gastrointestinal: Negative for abdominal pain, bowel incontinence, diarrhea and excessive appetite.  Genitourinary: Negative for decreased libido, genital sores and incomplete emptying.  Neurological: Negative for brief paralysis, focal weakness, headaches and loss of balance.  Psychiatric/Behavioral: Negative for altered mental status, depression and suicidal ideas.  Allergic/Immunologic: Negative for HIV exposure and persistent infections.    EKGs/Labs/Other Studies Reviewed:    The following studies were reviewed today:   EKG: None today   Coronary CTA December 2021 FINDINGS: Coronary calcium score: The patient's coronary artery calcium score is 459, which places the patient in the 55 percentile.   Coronary arteries: Normal coronary origins.  Right dominance.   Right Coronary Artery: Normal caliber vessel, gives rise to PDA. There is a small conus branch. There is a mixed calcified and noncalcified plaque at the proximal portion of the mid RCA with 1-24% stenosis.   Left Main  Coronary Artery: Normal caliber vessel. Mixed calcified and noncalcified plaque in the distal left main with 1-24% stenosis.   Left Anterior Descending Coronary Artery: Normal caliber vessel. There is mixed calcified and noncalcified plaque in the proximal LAD with at least 25-49% stenosis, and higher degree stenosis cannot be excluded. There is noncalcified plaque throughout the proximal LAD, and then there is a mixed calcified and noncalcified plaque proximal to the origin of D1 with at least 25-49% stenosis. There is scattered calcified and noncalcified plaqque throughout the mid LAD with 1-24% stenosis. Gives rise to 1 diagonal branch.   Left Circumflex Artery: Normal caliber vessel. There is mixed calcified and noncalcified plaque in the proximal LCx with 25-49% stenosis. Gives rise to 2 OM branches.   Aorta: Mildly enlarged size, 40 mm at the mid ascending aorta (level of the PA bifurcation) measured double oblique. Scattered calcifications, consistent with aortic atherosclerosis. No dissection.   Aortic Valve: Trivial calcifications. Trileaflet.   Other findings:   Normal pulmonary vein drainage into the left atrium.   Normal left atrial appendage without a thrombus.   Normal size of the pulmonary artery.   Likely small PFO with trivial left to right flow.   IMPRESSION: 1. At least mild nonobstructive CAD, CADRADS = 2. CT FFR will be performed and reported separately. Areas of most concern are in the proximal LAD, with less severe lesions in the proximal LCx and proximal/mid RCA.   2. Coronary calcium score of 459. This was 55th percentile for age and sex matched control.   3.  Normal coronary origin with right dominance.   4.  Aortic atherosclerosis   5.  Likely small PFO with trivial left to right shunt.    Recent Labs: 05/02/2022: ALT 36; TSH 1.342 08/30/2022: BUN 21; Creatinine, Ser 1.29; Hemoglobin 12.6; Magnesium 2.1; Platelets 226; Potassium 4.3; Sodium  139  Recent Lipid Panel    Component Value Date/Time   CHOL 141 09/10/2021 0000   CHOL 139 12/10/2019 0948   TRIG 66 09/10/2021 0000   HDL 62 09/10/2021 0000   HDL 47 12/10/2019 0948   CHOLHDL 2.3 09/10/2021 0000   LDLCALC 64 09/10/2021 0000    Physical Exam:    VS:  BP 106/62   Pulse 85   Ht '5\' 10"'$  (1.778 m)   Wt 100 kg   SpO2 91%   BMI 31.62 kg/m     Wt Readings from Last 3 Encounters:  11/13/22 100 kg  10/10/22 100.2 kg  08/30/22 98.2 kg     GEN: Well nourished, well developed in no acute distress HEENT: Normal NECK:  No JVD; No carotid bruits LYMPHATICS: No lymphadenopathy CARDIAC: S1S2 noted,RRR, no murmurs, rubs, gallops RESPIRATORY:  Clear to auscultation without rales, wheezing or rhonchi  ABDOMEN: Soft, non-tender, non-distended, +bowel sounds, no guarding. EXTREMITIES: No edema, No cyanosis, no clubbing MUSCULOSKELETAL:  No deformity  SKIN: Warm and dry NEUROLOGIC:  Alert and oriented x 3, non-focal PSYCHIATRIC:  Normal affect, good insight  ASSESSMENT:    1. Paroxysmal atrial fibrillation (HCC)   2. Essential hypertension   3. Mild CAD   4. Stage 3a chronic kidney disease ([place])   5. Mixed hyperlipidemia     PLAN:     His monitor does show evidence of significant supraventricular tachycardia burden.  He is currently on amiodarone 100 mg twice a day.  I like to add low-dose beta-blocker Toprol-XL 12.5 mg.  Take them a day.  In terms of his shortness of breath which is mostly at nighttime when he lies down we will try cautious diuretics here.  Lasix 20 mg will be started.  Blood pressure is acceptable, continue with current antihypertensive regimen.  CAD-no angina.  He has a plan CT with the [place].  Unclear if this is a coronary CTA because he mentioned a CT of the heart.  So I shared with the patient his previous CTA that showed that information.  However if he needs a CT scan of the lung that is a different study which can be carried on.   The  patient is in agreement with the above plan. The patient left the office in stable condition.  The patient will follow up in 1 month or sooner if needed.   Medication Adjustments/Labs and Tests Ordered: Current medicines are reviewed at length with the patient today.  Concerns regarding medicines are outlined above.  No orders of the defined types were placed in this encounter.  Meds ordered this encounter  Medications   metoprolol succinate (TOPROL XL) 25 MG 24 hr tablet    Sig: Take 0.5 tablets (12.5 mg total) by mouth daily.    Dispense:  45 tablet    Refill:  3   furosemide (LASIX) 20 MG tablet    Sig: Take 1 tablet (20 mg total) by mouth daily.    Dispense:  5 tablet    Refill:  0   potassium chloride (KLOR-CON) 10 MEQ tablet    Sig: Take 1 tablet (10 mEq total) by mouth daily.    Dispense:  5 tablet    Refill:  0    Patient Instructions  Medication Instructions:  Your physician has recommended you make the following change in your medication:  START: Toprol 12.'5mg'$  daily at 11am START: Lasix '20mg'$  daily for the next 5 days at 1pm START: Potassium 30mq's daily for the next 5 days at 1pm with the lasix  *If you need a refill on your cardiac medications before your next appointment, please call your pharmacy*   Lab Work: NONE If you have labs (blood work) drawn today and your tests are completely normal, you will receive your results only by: [place](if you have MyChart) OR A paper copy in the mail If you have any lab test that is abnormal or we need to change your treatment, we will call you to review the results.   Testing/Procedures: NONE   Follow-Up: At [facility] you and your health needs are our priority.  As part of our continuing mission to provide you with exceptional heart care, we have created designated Provider Care Teams.  These Care Teams include your primary Cardiologist (physician) and Advanced Practice Providers (APPs -  Physician  Assistants and Nurse Practitioners) who all work together to provide you with the care you need, when you need it.  We recommend signing up for the patient portal called "MyChart".  Sign up information is provided on this After Visit Summary.  MyChart is used to connect with patients for Virtual Visits (Telemedicine).  Patients are able to view lab/test results, encounter notes, upcoming appointments, etc.  Non-urgent messages can be sent to your provider as well.   To learn more about what you can do with MyChart, go to [email or web address].    Your next appointment:   4 month(s)  The format for your next appointment:   In Person  Provider:   Berniece Salines, DO     Adopting a Healthy Lifestyle.  Know what a healthy weight is for you (roughly BMI <25) and aim to maintain this   Aim for 7+ servings of fruits and vegetables daily   65-80+ fluid ounces of water or unsweet tea for healthy kidneys   Limit to max 1 drink of alcohol per day; avoid smoking/tobacco   Limit animal fats in diet for cholesterol and heart health - choose grass fed whenever available   Avoid highly processed foods, and foods high in saturated/trans fats   Aim for low stress - take time to unwind and care for your mental health   Aim for 150 min of moderate intensity exercise weekly for heart health, and weights twice weekly for bone health   Aim for 7-9 hours of sleep daily   When it comes to diets, agreement about the perfect plan isnt easy to find, even among the experts. Experts at the [employer] developed an idea known as the Healthy Eating Plate. Just imagine a plate divided into logical, healthy portions.   The emphasis is on diet quality:   Load up on vegetables and fruits - one-half of your plate: Aim for color and variety, and remember that potatoes dont count.   Go for whole grains - one-quarter of your plate: Whole wheat, barley, wheat berries, quinoa, oats, brown rice,  and foods made with them. If you want pasta, go with whole wheat pasta.   Protein power - one-quarter of your plate: Fish, chicken, beans, and nuts are all healthy, versatile protein sources. Limit red meat.   The diet, however, does go beyond the plate, offering a few other suggestions.   Use healthy plant oils, such as olive, canola, soy, corn, sunflower and peanut. Check the labels, and avoid partially hydrogenated oil, which have unhealthy trans fats.   If youre thirsty, drink water. Coffee and tea are good in moderation, but skip sugary drinks and limit milk and dairy products to one or two daily servings.   The type of carbohydrate in the diet is more important than the amount. Some sources of carbohydrates, such as vegetables, fruits, whole grains, and beans-are healthier than others.   Finally, stay active  Signed, Berniece Salines, DO  11/14/2022 8:16 AM    [place]

## 2022-11-26 DIAGNOSIS — Z85828 Personal history of other malignant neoplasm of skin: Secondary | ICD-10-CM | POA: Diagnosis not present

## 2022-11-26 DIAGNOSIS — L820 Inflamed seborrheic keratosis: Secondary | ICD-10-CM | POA: Diagnosis not present

## 2022-11-26 DIAGNOSIS — B353 Tinea pedis: Secondary | ICD-10-CM | POA: Diagnosis not present

## 2022-11-26 DIAGNOSIS — D1801 Hemangioma of skin and subcutaneous tissue: Secondary | ICD-10-CM | POA: Diagnosis not present

## 2022-12-09 ENCOUNTER — Other Ambulatory Visit: Payer: Self-pay | Admitting: Family Medicine

## 2022-12-19 ENCOUNTER — Ambulatory Visit: Payer: Medicare HMO | Admitting: Cardiology

## 2022-12-23 ENCOUNTER — Encounter: Payer: Self-pay | Admitting: Clinical Neurophysiology

## 2022-12-23 ENCOUNTER — Telehealth: Payer: Self-pay | Admitting: Family Medicine

## 2022-12-23 DIAGNOSIS — M5412 Radiculopathy, cervical region: Secondary | ICD-10-CM

## 2022-12-23 NOTE — Telephone Encounter (Signed)
Joshua [name] called to follow up on having Dr [name] order an Epidural injection. He said that he called about this two weeks ago but I did not see any record of that. Can you help?  He has been waiting for two weeks. Can we do this today?

## 2022-12-23 NOTE — Telephone Encounter (Signed)
Repeat epidural steroid injection ordered.  My apologies for the delay.  I am not quite sure what happened as the original request does not visible in our system.

## 2022-12-23 NOTE — Telephone Encounter (Signed)
Called and informed pt. Provided phone number to [facility] to schedule.

## 2022-12-30 NOTE — Progress Notes (Unsigned)
   I, [name], LAT, ATC acting as a scribe for [name], MD.  Joshua [name] is a 77 y.o. male who presents to [place] at [facility] today for continued left shoulder and arm pain.  Patient was last seen for this issue by Dr. [name] on 07/18/2022 and was given a L subacromial steroid injection and was switched to Lyrica. Pt's last cervical ESI was on 10/01/22. Today, pt reports ***  Dx imaging: 07/14/22 C-spine & L shoulder MRI 05/21/22 C-spine & L shoulder XR  Pertinent review of systems: ***  Relevant historical information: ***   Exam:  There were no vitals taken for this visit. General: Well Developed, well nourished, and in no acute distress.   MSK: ***    Lab and Radiology Results No results found for this or any previous visit (from the past 72 hour(s)). No results found.     Assessment and Plan: 77 y.o. male with ***   PDMP not reviewed this encounter. No orders of the defined types were placed in this encounter.  No orders of the defined types were placed in this encounter.    Discussed warning signs or symptoms. Please see discharge instructions. Patient expresses understanding.   ***

## 2022-12-31 ENCOUNTER — Ambulatory Visit: Payer: Self-pay

## 2022-12-31 ENCOUNTER — Ambulatory Visit: Payer: Medicare HMO | Admitting: Family Medicine

## 2022-12-31 ENCOUNTER — Other Ambulatory Visit: Payer: Self-pay

## 2022-12-31 ENCOUNTER — Encounter: Payer: Self-pay | Admitting: Family Medicine

## 2022-12-31 VITALS — BP 110/70 | HR 44 | Ht 70.0 in | Wt 222.8 lb

## 2022-12-31 DIAGNOSIS — M25512 Pain in left shoulder: Secondary | ICD-10-CM | POA: Diagnosis not present

## 2022-12-31 DIAGNOSIS — G8929 Other chronic pain: Secondary | ICD-10-CM | POA: Diagnosis not present

## 2022-12-31 MED ORDER — PREDNISONE 50 MG PO TABS: 50.0000 mg | ORAL_TABLET | Freq: Every day | ORAL | 0 refills | Status: DC

## 2022-12-31 MED ORDER — HYDROCODONE-ACETAMINOPHEN 5-325 MG PO TABS: 1.0000 | ORAL_TABLET | Freq: Three times a day (TID) | ORAL | 0 refills | Status: DC | PRN

## 2022-12-31 NOTE — Patient Instructions (Addendum)
Thank you for coming in today.   You received an injection today. Seek immediate medical attention if the joint becomes red, extremely painful, or is oozing fluid.   Return as early as next week, if not better, to inject the other part of the shoulder

## 2023-01-01 ENCOUNTER — Other Ambulatory Visit: Payer: Medicare HMO

## 2023-01-07 DIAGNOSIS — H43822 Vitreomacular adhesion, left eye: Secondary | ICD-10-CM | POA: Diagnosis not present

## 2023-01-07 DIAGNOSIS — H35342 Macular cyst, hole, or pseudohole, left eye: Secondary | ICD-10-CM | POA: Diagnosis not present

## 2023-01-10 ENCOUNTER — Telehealth: Payer: Self-pay | Admitting: Family Medicine

## 2023-01-10 NOTE — Telephone Encounter (Signed)
Pt had been having pain in his left shoulder and came in for an injection on 12/31/2022.  Patient is still having the same if not worse shoulder pain, especially in the morning.  Please advise on next steps.

## 2023-01-13 NOTE — Telephone Encounter (Signed)
Per visit note 12/31/22:  Glenohumeral injection performed today with moderate benefit. He could return as soon as 1 week for subacromial injection. We may consider repeat epidural steroid injection as well for cervical radiculopathy. He will let me know how he feels.   Can see if pt would like to come in for subacromial inj or could place order for repeat ESI, patient's preference.

## 2023-01-14 NOTE — Telephone Encounter (Signed)
Patient scheduled with Dr [name] Thursday.

## 2023-01-14 NOTE — Telephone Encounter (Signed)
Left message for patient to call back to schedule or order repeat epidural.

## 2023-01-15 NOTE — Progress Notes (Unsigned)
   Shirlyn Goltz, PhD, LAT, ATC acting as a scribe for [name], MD.  Joshua [name] is a 77 y.o. male who presents to [place] at [facility] today for continued left shoulder and arm pain.  Patient was last seen for this issue by Dr. [name] on 12/31/2022 and was given a left glenohumeral steroid injection.  Today, patient reports***  Dx imaging: 07/14/22 C-spine & L shoulder MRI 05/21/22 C-spine & L shoulder XR  Pertinent review of systems: ***  Relevant historical information: ***   Exam:  There were no vitals taken for this visit. General: Well Developed, well nourished, and in no acute distress.   MSK: ***    Lab and Radiology Results No results found for this or any previous visit (from the past 72 hour(s)). No results found.     Assessment and Plan: 77 y.o. male with ***   PDMP not reviewed this encounter. No orders of the defined types were placed in this encounter.  No orders of the defined types were placed in this encounter.    Discussed warning signs or symptoms. Please see discharge instructions. Patient expresses understanding.   ***

## 2023-01-16 ENCOUNTER — Other Ambulatory Visit: Payer: Self-pay

## 2023-01-16 ENCOUNTER — Ambulatory Visit (INDEPENDENT_AMBULATORY_CARE_PROVIDER_SITE_OTHER): Payer: Medicare HMO | Admitting: Family Medicine

## 2023-01-16 VITALS — BP 142/84 | HR 40 | Ht 70.0 in | Wt 221.0 lb

## 2023-01-16 DIAGNOSIS — G8929 Other chronic pain: Secondary | ICD-10-CM | POA: Diagnosis not present

## 2023-01-16 DIAGNOSIS — M25512 Pain in left shoulder: Secondary | ICD-10-CM | POA: Diagnosis not present

## 2023-01-16 NOTE — Patient Instructions (Addendum)
Thank you for coming in today.   You received an injection today. Seek immediate medical attention if the joint becomes red, extremely painful, or is oozing fluid.   I can repeat this injection in 3 months.

## 2023-01-21 ENCOUNTER — Telehealth: Payer: Self-pay | Admitting: Family Medicine

## 2023-01-21 NOTE — Telephone Encounter (Signed)
Called patient to schedule Medicare Annual Wellness Visit (AWV). Unable to reach patient.  Last date of AWV: Never  Please schedule an appointment at any time with Nurse Health Advisor.  If any questions, please contact me at [phone].  Thank you ,  Morgan Jessup Patient Access Advocate II Direct Dial: [phone]   

## 2023-02-05 DIAGNOSIS — N138 Other obstructive and reflux uropathy: Secondary | ICD-10-CM | POA: Diagnosis not present

## 2023-02-05 DIAGNOSIS — R3129 Other microscopic hematuria: Secondary | ICD-10-CM | POA: Diagnosis not present

## 2023-02-05 DIAGNOSIS — N200 Calculus of kidney: Secondary | ICD-10-CM | POA: Diagnosis not present

## 2023-02-05 DIAGNOSIS — N401 Enlarged prostate with lower urinary tract symptoms: Secondary | ICD-10-CM | POA: Diagnosis not present

## 2023-02-21 DIAGNOSIS — N138 Other obstructive and reflux uropathy: Secondary | ICD-10-CM | POA: Diagnosis not present

## 2023-02-21 DIAGNOSIS — N401 Enlarged prostate with lower urinary tract symptoms: Secondary | ICD-10-CM | POA: Diagnosis not present

## 2023-03-10 ENCOUNTER — Ambulatory Visit
Admission: EM | Admit: 2023-03-10 | Discharge: 2023-03-10 | Disposition: A | Discharge status: Home / Self Care | Payer: Medicare HMO | Attending: Emergency Medicine | Admitting: Emergency Medicine

## 2023-03-10 ENCOUNTER — Encounter: Payer: Self-pay | Admitting: Emergency Medicine

## 2023-03-10 ENCOUNTER — Ambulatory Visit (INDEPENDENT_AMBULATORY_CARE_PROVIDER_SITE_OTHER): Payer: Medicare HMO

## 2023-03-10 DIAGNOSIS — S6992XA Unspecified injury of left wrist, hand and finger(s), initial encounter: Secondary | ICD-10-CM

## 2023-03-10 DIAGNOSIS — S63255A Unspecified dislocation of left ring finger, initial encounter: Secondary | ICD-10-CM

## 2023-03-10 DIAGNOSIS — M79642 Pain in left hand: Secondary | ICD-10-CM | POA: Diagnosis not present

## 2023-03-10 MED ORDER — HYDROCODONE-ACETAMINOPHEN 5-325 MG PO TABS: 0.5000 | ORAL_TABLET | Freq: Four times a day (QID) | ORAL | 0 refills | Status: DC | PRN

## 2023-03-10 NOTE — ED Provider Notes (Signed)
HPI  SUBJECTIVE:  Joshua [name] is a right-handed 77 y.o. male who presents with dull, constant pain, swelling along the PIP of his left ring finger after dislocating it 1 week ago with a trip and fall.  He was able to reduce it himself.  No numbness or tingling, weakness with extension or flexion.  He has been buddy taping his finger, with some improvement in his symptoms, but now he states that his middle finger hurts as well.  His pain is worse with finger flexion, particularly at the PIP. Patient has a past medical history of atrial fibrillation status post cardioversion on Eliquis, emphysema.  No history of chronic kidney disease.  PCP: [facility].  Orthopedics: None.  Past Medical History:  Diagnosis Date   Abnormal findings on diagnostic imaging of lung 09/23/2019   09/22/2019-lung cancer screening-centrilobular and paraseptal emphysema, calcified granulomata noted bilaterally, no suspicious nodules, lung RADS 1, repeat in 12 months    Anxiety    Atrial fibrillation (HCC) 09/23/2019   Centrilobular emphysema (HCC) 09/23/2019   09/22/2019-lung cancer screening-centrilobular and paraseptal emphysema, calcified granulomata noted bilaterally, no suspicious nodules, lung RADS 1, repeat in 12 months  09/23/2019-FVC 3.74 (81% predicted), postbronchodilator ratio 80, postbronchodilator FEV1 2.88 (86% predicted), no bronchodilator response, DLCO 19.44 (73% predicted)   DDD (degenerative disc disease), cervical 05/13/2008   Cervical Disc Degeneration   Depression    History of adenomatous polyp of colon 03/03/2018   05/14/17: Colonoscopy: nonadvanced adenoma, f/u 5 yrs, use SuPrep, Murphy/GAP  Last Assessment & Plan:  Relevant Hx: Course: Daily Update: Today's Plan:   Hyperlipidemia    Kidney stone    Nephrolithiasis 05/22/2011   Nontraumatic incomplete tear of right rotator cuff 10/14/2018   Other and unspecified hyperlipidemia 11/20/2009   Hyperlipidemia   RBBB 06/09/2012    Secondary hypercoagulable state (HCC) 09/28/2019   Shortness of breath 09/23/2019    Past Surgical History:  Procedure Laterality Date   CARDIOVERSION N/A 10/15/2019   Procedure: CARDIOVERSION;  Surgeon: Jake Bathe, MD;  Location[facility] ENDOSCOPY;  Service: Cardiovascular;  Laterality: N/A;   CARDIOVERSION N/A 11/29/2019   Procedure: CARDIOVERSION;  Surgeon: Lars Masson, MD;  Location: [facility] ENDOSCOPY;  Service: Cardiovascular;  Laterality: N/A;   HEMORRHOID SURGERY     TOTAL HIP ARTHROPLASTY 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/2021   Procedure: TOTAL HIP ARTHROPLASTY ANTERIOR APPROACH;  Surgeon: Jodi Geralds, MD;  Location: [facility] ORS;  Service: Orthopedics;  Laterality: Right;   VARICOSE VEIN SURGERY Right     Family History  Problem Relation Age of Onset   Varicose Veins Mother    Stroke Father     Social History   Tobacco Use   Smoking status: Former    Packs/day: 2.00    Years: 46.00    Additional pack years: 0.00    Total pack years: 92.00    Types: Cigarettes    Quit date: 2010    Years since quitting: 14.3   Smokeless tobacco: Never   Tobacco comments:    Former smoker 05/02/22  Vaping Use   Vaping Use: Never used  Substance Use Topics   Alcohol use: Not Currently    Alcohol/week: 14.0 standard drinks of alcohol    Types: 14 Cans of beer per week    Comment: stop drinking 44 days ago 05/02/22   Drug use: No    No current facility-administered medications for this encounter.  Current Outpatient Medications:    amiodarone (PACERONE) 200 MG tablet, TAKE 1/2 TABLET BY  MOUTH DAILY, Disp: 45 tablet, Rfl: 2   apixaban (ELIQUIS) 5 MG TABS tablet, Take 5 mg by mouth 2 (two) times daily., Disp: , Rfl:    atorvastatin (LIPITOR) 40 MG tablet, TAKE ONE TABLET BY MOUTH DAILY, Disp: 90 tablet, Rfl: 3   clobetasol (TEMOVATE) 0.05 % external solution, Apply 1 application topically once a week. Use in hair, Disp: , Rfl:    Docusate Sodium (COLACE PO), Take 1 tablet by mouth in the morning and  at bedtime., Disp: , Rfl:    fluorouracil (EFUDEX) 5 % cream, Apply 1 application topically daily as needed (On head over night)., Disp: , Rfl:    FLUoxetine (PROZAC) 40 MG capsule, Take 40 mg by mouth daily., Disp: , Rfl:    HYDROcodone-acetaminophen (NORCO/VICODIN) 5-325 MG tablet, Take 0.5 tablets by mouth every 6 (six) hours as needed for moderate pain or severe pain., Disp: 9 tablet, Rfl: 0   ketoconazole (NIZORAL) 2 % shampoo, Apply 1 application topically 2 (two) times a week. Monday and Thursday, Disp: , Rfl:    lamoTRIgine (LAMICTAL) 200 MG tablet, Take 100 mg by mouth 2 (two) times daily., Disp: , Rfl:    metoprolol succinate (TOPROL XL) 25 MG 24 hr tablet, Take 0.5 tablets (12.5 mg total) by mouth daily., Disp: 45 tablet, Rfl: 3   naltrexone (DEPADE) 50 MG tablet, Take 50 mg by mouth every morning., Disp: , Rfl:    pregabalin (LYRICA) 75 MG capsule, TAKE 1 CAPSULE BY MOUTH TWICE A DAY AS NEEDED, Disp: 60 capsule, Rfl: 4   tamsulosin (FLOMAX) 0.4 MG CAPS capsule, Take 0.4 mg by mouth every other day., Disp: , Rfl:    furosemide (LASIX) 20 MG tablet, Take 1 tablet (20 mg total) by mouth daily., Disp: 5 tablet, Rfl: 0   potassium chloride (KLOR-CON) 10 MEQ tablet, Take 1 tablet (10 mEq total) by mouth daily., Disp: 5 tablet, Rfl: 0  Allergies  Allergen Reactions   Venlafaxine Palpitations   Bupropion Other (See Comments)    unknown   Tramadol Other (See Comments)    Muscle spasm   Zoloft [Sertraline Hcl] Other (See Comments)    Tick     ROS  As noted in HPI.   Physical Exam  BP 123/68   Pulse 92   Temp 97.8 F (36.6 C) (Oral)   Resp 18   Ht 5\' 11"  (1.803 m)   Wt 96.6 kg   SpO2 93%   BMI 29.71 kg/m   Constitutional: Well developed, well nourished, no acute distress Eyes:  EOMI, conjunctiva normal bilaterally HENT: Normocephalic, atraumatic,mucus membranes moist Respiratory: Normal inspiratory effort Cardiovascular: Normal rate GI: nondistended skin: No rash,  skin intact Musculoskeletal: Left ring finger: Swelling, tenderness at the PIP.  Positive PIP laxity with varus and valgus stress.  No laxity with varus/valgus stress at the DIP.  No other tenderness over the phalanges, MCP or DIP joint.  No tenderness over the hands.  Cap refill less than 2 seconds.  distal motor and intact motor strength 5/5 flexion / extension / FDP / FDS against resistance and 2-point discrimination intact at 5mm in affected digit(s). Skin intact. No signs of trauma. Wrist WNL.   Neurologic: Alert & oriented x 3, no focal neuro deficits Psychiatric: Speech and behavior appropriate   ED Course   Medications - No data to display  Orders Placed This Encounter  Procedures   DG Hand Complete Left    Standing Status:   Standing    Number  of Occurrences:   1    Order Specific Question:   Reason for Exam (SYMPTOM  OR DIAGNOSIS REQUIRED)    Answer:   left 4th finger injury   Apply finger splint static    Ring finger isolate PIP    Standing Status:   Standing    Number of Occurrences:   1    Order Specific Question:   Laterality    Answer:   Left    No results found for this or any previous visit (from the past 24 hour(s)). DG Hand Complete Left  Result Date: 03/10/2023 CLINICAL DATA:  Left fourth finger injury. EXAM: LEFT HAND - COMPLETE 3+ VIEW COMPARISON:  None Available. FINDINGS: No acute osseous or joint abnormality. Distal interphalangeal joint osteoarthritis. IMPRESSION: 1. No acute findings. 2. DIP joint osteoarthritis. Electronically Signed   By: Leanna Battles M.D.   On: 03/10/2023 13:22    ED Clinical Impression  1. Injury of finger of left hand, initial encounter   2. Closed dislocation of left ring finger      ED Assessment/Plan      Reviewed imaging independently.  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  No acute findings.  See radiology report for full details.  [facility] narcotic database reviewed.  No narcotic prescriptions since 2023 feel that it is  appropriate to prescribe patient with a short-term narcotic prescription.  He may take half a Norco for severe, relentless pain, although I discussed with him that I think that once we immobilize his finger, the pain will mostly resolve.  He states that Tylenol does not work for him.  Unfortunately, no NSAIDs because of the Eliquis.  Will have him follow-up with any one of the hand surgeons at [facility] ASAP.  Placing referral.  Discussed imaging, MDM, treatment plan, and plan for follow-up with patient. patient agrees with plan.   Meds ordered this encounter  Medications   HYDROcodone-acetaminophen (NORCO/VICODIN) 5-325 MG tablet    Sig: Take 0.5 tablets by mouth every 6 (six) hours as needed for moderate pain or severe pain.    Dispense:  9 tablet    Refill:  0      *This clinic note was created using Scientist, clinical (histocompatibility and immunogenetics). Therefore, there may be occasional mistakes despite careful proofreading.  ?    Domenick Gong, MD 03/10/23 2037

## 2023-03-10 NOTE — ED Triage Notes (Signed)
Patient states that he fell about a week ago while out of town injury his left 4th finger.  Patient states that he popped the finger back into place.  Patient does have it buddy tape at the moment.  Denies taken any OTC pain meds.

## 2023-03-10 NOTE — Discharge Instructions (Signed)
I have put in a referral to Dr. Yehuda Budd, who is the hand surgeon at [facility].  They have other excellent hand surgeons on staff.  Please follow-up with anyone that you can see the soonest.  Keep the finger immobilized.  You may take it out and move it from time to time.  Take one half Norco for severe pain only.  Otherwise, 1000 mg of Tylenol 3 times a day should be sufficient.  If you are taking naloxone, the Norco will not work.

## 2023-03-11 ENCOUNTER — Ambulatory Visit: Payer: Medicare HMO | Attending: Cardiology | Admitting: Cardiology

## 2023-03-11 ENCOUNTER — Encounter: Payer: Self-pay | Admitting: Cardiology

## 2023-03-11 VITALS — BP 118/72 | HR 50 | Ht 71.0 in | Wt 220.2 lb

## 2023-03-11 DIAGNOSIS — N183 Chronic kidney disease, stage 3 unspecified: Secondary | ICD-10-CM | POA: Diagnosis not present

## 2023-03-11 DIAGNOSIS — I480 Paroxysmal atrial fibrillation: Secondary | ICD-10-CM

## 2023-03-11 DIAGNOSIS — G4733 Obstructive sleep apnea (adult) (pediatric): Secondary | ICD-10-CM

## 2023-03-11 DIAGNOSIS — E782 Mixed hyperlipidemia: Secondary | ICD-10-CM

## 2023-03-11 DIAGNOSIS — Z79899 Other long term (current) drug therapy: Secondary | ICD-10-CM

## 2023-03-11 DIAGNOSIS — I10 Essential (primary) hypertension: Secondary | ICD-10-CM | POA: Diagnosis not present

## 2023-03-11 DIAGNOSIS — I2510 Atherosclerotic heart disease of native coronary artery without angina pectoris: Secondary | ICD-10-CM | POA: Diagnosis not present

## 2023-03-11 LAB — COMPREHENSIVE METABOLIC PANEL
ALT: 45 IU/L — ABNORMAL HIGH (ref 0–44)
AST: 50 IU/L — ABNORMAL HIGH (ref 0–40)
Albumin/Globulin Ratio: 1.9 (ref 1.2–2.2)
Albumin: 4.2 g/dL (ref 3.8–4.8)
Alkaline Phosphatase: 110 IU/L (ref 44–121)
BUN/Creatinine Ratio: 12 (ref 10–24)
BUN: 16 mg/dL (ref 8–27)
Bilirubin Total: 0.7 mg/dL (ref 0.0–1.2)
CO2: 26 mmol/L (ref 20–29)
Calcium: 9.2 mg/dL (ref 8.6–10.2)
Chloride: 105 mmol/L (ref 96–106)
Creatinine, Ser: 1.34 mg/dL — ABNORMAL HIGH (ref 0.76–1.27)
Globulin, Total: 2.2 g/dL (ref 1.5–4.5)
Glucose: 103 mg/dL — ABNORMAL HIGH (ref 70–99)
Potassium: 4.2 mmol/L (ref 3.5–5.2)
Sodium: 142 mmol/L (ref 134–144)
Total Protein: 6.4 g/dL (ref 6.0–8.5)
eGFR: 56 mL/min/{1.73_m2} — ABNORMAL LOW (ref >59)

## 2023-03-11 LAB — LIPID PANEL
Chol/HDL Ratio: 2.5 ratio (ref 0.0–5.0)
Cholesterol, Total: 149 mg/dL (ref 100–199)
HDL: 59 mg/dL (ref >39)
LDL Chol Calc (NIH): 79 mg/dL (ref 0–99)
Triglycerides: 48 mg/dL (ref 0–149)
VLDL Cholesterol Cal: 11 mg/dL (ref 5–40)

## 2023-03-11 LAB — MAGNESIUM: Magnesium: 2.2 mg/dL (ref 1.6–2.3)

## 2023-03-11 NOTE — Patient Instructions (Signed)
Medication Instructions:  Your physician recommends that you continue on your current medications as directed. Please refer to the Current Medication list given to you today.  *If you need a refill on your cardiac medications before your next appointment, please call your pharmacy*   Lab Work: Your physician recommends that you have labs drawn today: CMET, Mag, Lipids If you have labs (blood work) drawn today and your tests are completely normal, you will receive your results only by: MyChart Message (if you have MyChart) OR A paper copy in the mail If you have any lab test that is abnormal or we need to change your treatment, we will call you to review the results.   Testing/Procedures: None   Follow-Up: At [facility], you and your health needs are our priority.  As part of our continuing mission to provide you with exceptional heart care, we have created designated Provider Care Teams.  These Care Teams include your primary Cardiologist (physician) and Advanced Practice Providers (APPs -  Physician Assistants and Nurse Practitioners) who all work together to provide you with the care you need, when you need it.   Your next appointment:   1 year(s)  Provider:   Kardie Tobb, DO  

## 2023-03-11 NOTE — Progress Notes (Unsigned)
Cardiology Office Note:    Date:  03/12/2023   ID:  Joshua [name], DOB [birth year], MRN [id number]  PCP:  Everrett Coombe, DO  Cardiologist:  Thomasene Ripple, DO  Electrophysiologist:  None   Referring MD: Everrett Coombe, DO   " I am having shortness of breath"  History of Present Illness:    Joshua [name] is a 77 y.o. male with a hx of  atrial fibrillation status post DC cardioversion on November 29, 2019 and October 15, 2019 now on amiodarone and Eliquis, mild coronary artery disease, recently aorta dilatation. The patient is here today for follow-up visit.   I saw the patient in November 2021 at that time we talked about his stress test which was normal but he had symptoms I therefore proceeded the patient to get a coronary CTA.  He did get his coronary CTA which showed evidence of mild coronary artery disease with proximal ascending aorta dilatation.   I saw the patient the patient on Mar 27, 2021 at that time he appeared to be doing well from a cardiovascular standpoint.  I saw the patient on December 13, 2021 at that time he was planning surgery.   At his last visit in October 2023 at that time he was experiencing some shortness of breath suspect intermittent atrial fibrillation as that corporate here.  Placed a monitor on the patient.  Since his last visit with me in 11/2022 he had been doing well - has been doing well with the toprol. No complaints today.    Past Medical History:  Diagnosis Date   Abnormal findings on diagnostic imaging of lung 09/23/2019   09/22/2019-lung cancer screening-centrilobular and paraseptal emphysema, calcified granulomata noted bilaterally, no suspicious nodules, lung RADS 1, repeat in 12 months    Anxiety    Atrial fibrillation (HCC) 09/23/2019   Centrilobular emphysema (HCC) 09/23/2019   09/22/2019-lung cancer screening-centrilobular and paraseptal emphysema, calcified granulomata noted bilaterally, no suspicious nodules, lung RADS 1,  repeat in 12 months  09/23/2019-FVC 3.74 (81% predicted), postbronchodilator ratio 80, postbronchodilator FEV1 2.88 (86% predicted), no bronchodilator response, DLCO 19.44 (73% predicted)   DDD (degenerative disc disease), cervical 05/13/2008   Cervical Disc Degeneration   Depression    History of adenomatous polyp of colon 03/03/2018   05/14/17: Colonoscopy: nonadvanced adenoma, f/u 5 yrs, use SuPrep, Murphy/GAP  Last Assessment & Plan:  Relevant Hx: Course: Daily Update: Today's Plan:   Hyperlipidemia    Kidney stone    Nephrolithiasis 05/22/2011   Nontraumatic incomplete tear of right rotator cuff 10/14/2018   Other and unspecified hyperlipidemia 11/20/2009   Hyperlipidemia   RBBB 06/09/2012   Secondary hypercoagulable state (HCC) 09/28/2019   Shortness of breath 09/23/2019    Past Surgical History:  Procedure Laterality Date   CARDIOVERSION N/A 10/15/2019   Procedure: CARDIOVERSION;  Surgeon: Jake Bathe, MD;  Location: [facility] ENDOSCOPY;  Service: Cardiovascular;  Laterality: N/A;   CARDIOVERSION N/A 11/29/2019   Procedure: CARDIOVERSION;  Surgeon: Lars Masson, MD;  Location: [facility] ENDOSCOPY;  Service: Cardiovascular;  Laterality: N/A;   HEMORRHOID SURGERY     TOTAL HIP ARTHROPLASTY Right 12/21/2021   Procedure: TOTAL HIP ARTHROPLASTY ANTERIOR APPROACH;  Surgeon: Jodi Geralds, MD;  Location: [facility] ORS;  Service: Orthopedics;  Laterality: Right;   VARICOSE VEIN SURGERY Right     Current Medications: Current Meds  Medication Sig   amiodarone (PACERONE) 200 MG tablet TAKE 1/2 TABLET BY MOUTH DAILY   apixaban (ELIQUIS) 5 MG TABS tablet  Take 5 mg by mouth 2 (two) times daily.   atorvastatin (LIPITOR) 40 MG tablet TAKE ONE TABLET BY MOUTH DAILY   clobetasol (TEMOVATE) 0.05 % external solution Apply 1 application topically once a week. Use in hair   Docusate Sodium (COLACE PO) Take 1 tablet by mouth in the morning and at bedtime.   fluorouracil (EFUDEX) 5 % cream Apply 1 application  topically daily as needed (On head over night).   FLUoxetine (PROZAC) 40 MG capsule Take 40 mg by mouth daily.   HYDROcodone-acetaminophen (NORCO/VICODIN) 5-325 MG tablet Take 0.5 tablets by mouth every 6 (six) hours as needed for moderate pain or severe pain.   ketoconazole (NIZORAL) 2 % shampoo Apply 1 application topically 2 (two) times a week. Monday and Thursday   lamoTRIgine (LAMICTAL) 200 MG tablet Take 100 mg by mouth 2 (two) times daily.   metoprolol succinate (TOPROL XL) 25 MG 24 hr tablet Take 0.5 tablets (12.5 mg total) by mouth daily.   naltrexone (DEPADE) 50 MG tablet Take 50 mg by mouth every morning.   pregabalin (LYRICA) 75 MG capsule TAKE 1 CAPSULE BY MOUTH TWICE A DAY AS NEEDED   tamsulosin (FLOMAX) 0.4 MG CAPS capsule Take 0.4 mg by mouth every other day.     Allergies:   Venlafaxine, Bupropion, Tramadol, and Zoloft [sertraline hcl]   Social History   Socioeconomic History   Marital status: Married    Spouse name: Not on file   Number of children: Not on file   Years of education: Not on file   Highest education level: Not on file  Occupational History   Not on file  Tobacco Use   Smoking status: Former    Packs/day: 2.00    Years: 46.00    Additional pack years: 0.00    Total pack years: 92.00    Types: Cigarettes    Quit date: 2010    Years since quitting: 14.3   Smokeless tobacco: Never   Tobacco comments:    Former smoker 05/02/22  Vaping Use   Vaping Use: Never used  Substance and Sexual Activity   Alcohol use: Not Currently    Alcohol/week: 14.0 standard drinks of alcohol    Types: 14 Cans of beer per week    Comment: stop drinking 44 days ago 05/02/22   Drug use: No   Sexual activity: Yes    Partners: Female  Other Topics Concern   Not on file  Social History Narrative   Not on file   Social Determinants of Health   Financial Resource Strain: Not on file  Food Insecurity: Not on file  Transportation Needs: Not on file  Physical  Activity: Not on file  Stress: Not on file  Social Connections: Not on file     Family History: The patient's family history includes Stroke in his father; Varicose Veins in his mother.  ROS:   Review of Systems  Constitution: Negative for decreased appetite, fever and weight gain.  HENT: Negative for congestion, ear discharge, hoarse voice and sore throat.   Eyes: Negative for discharge, redness, vision loss in right eye and visual halos.  Cardiovascular: Negative for chest pain, dyspnea on exertion, leg swelling, orthopnea and palpitations.  Respiratory: Negative for cough, hemoptysis, shortness of breath and snoring.   Endocrine: Negative for heat intolerance and polyphagia.  Hematologic/Lymphatic: Negative for bleeding problem. Does not bruise/bleed easily.  Skin: Negative for flushing, nail changes, rash and suspicious lesions.  Musculoskeletal: Negative for arthritis, joint pain, muscle  cramps, myalgias, neck pain and stiffness.  Gastrointestinal: Negative for abdominal pain, bowel incontinence, diarrhea and excessive appetite.  Genitourinary: Negative for decreased libido, genital sores and incomplete emptying.  Neurological: Negative for brief paralysis, focal weakness, headaches and loss of balance.  Psychiatric/Behavioral: Negative for altered mental status, depression and suicidal ideas.  Allergic/Immunologic: Negative for HIV exposure and persistent infections.    EKGs/Labs/Other Studies Reviewed:    The following studies were reviewed today:   EKG: None today   Coronary CTA December 2021 FINDINGS: Coronary calcium score: The patient's coronary artery calcium score is 459, which places the patient in the 55 percentile.   Coronary arteries: Normal coronary origins.  Right dominance.   Right Coronary Artery: Normal caliber vessel, gives rise to PDA. There is a small conus branch. There is a mixed calcified and noncalcified plaque at the proximal portion of the mid  RCA with 1-24% stenosis.   Left Main Coronary Artery: Normal caliber vessel. Mixed calcified and noncalcified plaque in the distal left main with 1-24% stenosis.   Left Anterior Descending Coronary Artery: Normal caliber vessel. There is mixed calcified and noncalcified plaque in the proximal LAD with at least 25-49% stenosis, and higher degree stenosis cannot be excluded. There is noncalcified plaque throughout the proximal LAD, and then there is a mixed calcified and noncalcified plaque proximal to the origin of D1 with at least 25-49% stenosis. There is scattered calcified and noncalcified plaqque throughout the mid LAD with 1-24% stenosis. Gives rise to 1 diagonal branch.   Left Circumflex Artery: Normal caliber vessel. There is mixed calcified and noncalcified plaque in the proximal LCx with 25-49% stenosis. Gives rise to 2 OM branches.   Aorta: Mildly enlarged size, 40 mm at the mid ascending aorta (level of the PA bifurcation) measured double oblique. Scattered calcifications, consistent with aortic atherosclerosis. No dissection.   Aortic Valve: Trivial calcifications. Trileaflet.   Other findings:   Normal pulmonary vein drainage into the left atrium.   Normal left atrial appendage without a thrombus.   Normal size of the pulmonary artery.   Likely small PFO with trivial left to right flow.   IMPRESSION: 1. At least mild nonobstructive CAD, CADRADS = 2. CT FFR will be performed and reported separately. Areas of most concern are in the proximal LAD, with less severe lesions in the proximal LCx and proximal/mid RCA.   2. Coronary calcium score of 459. This was 55th percentile for age and sex matched control.   3.  Normal coronary origin with right dominance.   4.  Aortic atherosclerosis   5.  Likely small PFO with trivial left to right shunt.    Recent Labs: 05/02/2022: TSH 1.342 08/30/2022: Hemoglobin 12.6; Platelets 226 03/11/2023: ALT 45; BUN 16;  Creatinine, Ser 1.34; Magnesium 2.2; Potassium 4.2; Sodium 142  Recent Lipid Panel    Component Value Date/Time   CHOL 149 03/11/2023 0946   TRIG 48 03/11/2023 0946   HDL 59 03/11/2023 0946   CHOLHDL 2.5 03/11/2023 0946   CHOLHDL 2.3 09/10/2021 0000   LDLCALC 79 03/11/2023 0946   LDLCALC 64 09/10/2021 0000    Physical Exam:    VS:  BP 118/72   Pulse (!) 50   Ht 5\' 11"  (1.803 m)   Wt 220 lb 3.2 oz (99.9 kg)   SpO2 98%   BMI 30.71 kg/m     Wt Readings from Last 3 Encounters:  03/11/23 220 lb 3.2 oz (99.9 kg)  03/10/23 213 lb (96.6 kg)  01/16/23 221 lb (100.2 kg)     GEN: Well nourished, well developed in no acute distress HEENT: Normal NECK: No JVD; No carotid bruits LYMPHATICS: No lymphadenopathy CARDIAC: S1S2 noted,RRR, no murmurs, rubs, gallops RESPIRATORY:  Clear to auscultation without rales, wheezing or rhonchi  ABDOMEN: Soft, non-tender, non-distended, +bowel sounds, no guarding. EXTREMITIES: No edema, No cyanosis, no clubbing MUSCULOSKELETAL:  No deformity  SKIN: Warm and dry NEUROLOGIC:  Alert and oriented x 3, non-focal PSYCHIATRIC:  Normal affect, good insight  ASSESSMENT:    1. Mixed hyperlipidemia   2. Medication management   3. Paroxysmal atrial fibrillation (HCC)   4. Essential hypertension   5. Mild CAD   6. OSA (obstructive sleep apnea)   7. Stage 3 chronic kidney disease, unspecified whether stage 3a or 3b CKD (HCC)      PLAN:    Paroxysmal atrial fibrillation-continue current medication regimen with apixaban and metoprolol.  Hyperlipidemia - continue with current statin medication.  Blood pressure is acceptable, continue with current antihypertensive regimen.  CAD no angina continue Lipitor 40 mg daily.  On Eliquis aspirin previously stopped.  Will get lipid profile today with CMP and mag.  The patient is in agreement with the above plan. The patient left the office in stable condition.  The patient will follow up in 1 year or sooner  if needed.   Medication Adjustments/Labs and Tests Ordered: Current medicines are reviewed at length with the patient today.  Concerns regarding medicines are outlined above.  Orders Placed This Encounter  Procedures   Comprehensive Metabolic Panel (CMET)   Magnesium   Lipid panel   No orders of the defined types were placed in this encounter.   Patient Instructions  Medication Instructions:  Your physician recommends that you continue on your current medications as directed. Please refer to the Current Medication list given to you today.  *If you need a refill on your cardiac medications before your next appointment, please call your pharmacy*   Lab Work: Your physician recommends that you have labs drawn today: CMET, Mag, Lipids If you have labs (blood work) drawn today and your tests are completely normal, you will receive your results only by: MyChart Message (if you have MyChart) OR A paper copy in the mail If you have any lab test that is abnormal or we need to change your treatment, we will call you to review the results.   Testing/Procedures: None   Follow-Up: At [facility], you and your health needs are our priority.  As part of our continuing mission to provide you with exceptional heart care, we have created designated Provider Care Teams.  These Care Teams include your primary Cardiologist (physician) and Advanced Practice Providers (APPs -  Physician Assistants and Nurse Practitioners) who all work together to provide you with the care you need, when you need it.   Your next appointment:   1 year(s)  Provider:   Thomasene Ripple, DO    Adopting a Healthy Lifestyle.  Know what a healthy weight is for you (roughly BMI <25) and aim to maintain this   Aim for 7+ servings of fruits and vegetables daily   65-80+ fluid ounces of water or unsweet tea for healthy kidneys   Limit to max 1 drink of alcohol per day; avoid smoking/tobacco   Limit animal fats  in diet for cholesterol and heart health - choose grass fed whenever available   Avoid highly processed foods, and foods high in saturated/trans fats   Aim  for low stress - take time to unwind and care for your mental health   Aim for 150 min of moderate intensity exercise weekly for heart health, and weights twice weekly for bone health   Aim for 7-9 hours of sleep daily   When it comes to diets, agreement about the perfect plan isnt easy to find, even among the experts. Experts at the [facility] [employer] developed an idea known as the Healthy Eating Plate. Just imagine a plate divided into logical, healthy portions.   The emphasis is on diet quality:   Load up on vegetables and fruits - one-half of your plate: Aim for color and variety, and remember that potatoes dont count.   Go for whole grains - one-quarter of your plate: Whole wheat, barley, wheat berries, quinoa, oats, brown rice, and foods made with them. If you want pasta, go with whole wheat pasta.   Protein power - one-quarter of your plate: Fish, chicken, beans, and nuts are all healthy, versatile protein sources. Limit red meat.   The diet, however, does go beyond the plate, offering a few other suggestions.   Use healthy plant oils, such as olive, canola, soy, corn, sunflower and peanut. Check the labels, and avoid partially hydrogenated oil, which have unhealthy trans fats.   If youre thirsty, drink water. Coffee and tea are good in moderation, but skip sugary drinks and limit milk and dairy products to one or two daily servings.   The type of carbohydrate in the diet is more important than the amount. Some sources of carbohydrates, such as vegetables, fruits, whole grains, and beans-are healthier than others.   Finally, stay active  Signed, Thomasene Ripple, DO  03/12/2023 10:20 AM     [facility]

## 2023-03-12 DIAGNOSIS — M79645 Pain in left finger(s): Secondary | ICD-10-CM | POA: Insufficient documentation

## 2023-03-14 DIAGNOSIS — M47816 Spondylosis without myelopathy or radiculopathy, lumbar region: Secondary | ICD-10-CM | POA: Diagnosis not present

## 2023-03-14 DIAGNOSIS — S0990XA Unspecified injury of head, initial encounter: Secondary | ICD-10-CM | POA: Diagnosis not present

## 2023-03-14 DIAGNOSIS — Z87891 Personal history of nicotine dependence: Secondary | ICD-10-CM | POA: Diagnosis not present

## 2023-03-14 DIAGNOSIS — Z888 Allergy status to other drugs, medicaments and biological substances status: Secondary | ICD-10-CM | POA: Diagnosis not present

## 2023-03-14 DIAGNOSIS — E785 Hyperlipidemia, unspecified: Secondary | ICD-10-CM | POA: Diagnosis not present

## 2023-03-14 DIAGNOSIS — M5030 Other cervical disc degeneration, unspecified cervical region: Secondary | ICD-10-CM | POA: Diagnosis not present

## 2023-03-14 DIAGNOSIS — M7989 Other specified soft tissue disorders: Secondary | ICD-10-CM | POA: Diagnosis not present

## 2023-03-14 DIAGNOSIS — G8911 Acute pain due to trauma: Secondary | ICD-10-CM | POA: Diagnosis not present

## 2023-03-14 DIAGNOSIS — S90811A Abrasion, right foot, initial encounter: Secondary | ICD-10-CM | POA: Diagnosis not present

## 2023-03-14 DIAGNOSIS — Z7901 Long term (current) use of anticoagulants: Secondary | ICD-10-CM | POA: Diagnosis not present

## 2023-03-14 DIAGNOSIS — Z79899 Other long term (current) drug therapy: Secondary | ICD-10-CM | POA: Diagnosis not present

## 2023-03-18 ENCOUNTER — Other Ambulatory Visit: Payer: Self-pay

## 2023-03-18 DIAGNOSIS — R899 Unspecified abnormal finding in specimens from other organs, systems and tissues: Secondary | ICD-10-CM

## 2023-03-20 DIAGNOSIS — R296 Repeated falls: Secondary | ICD-10-CM | POA: Diagnosis not present

## 2023-03-20 DIAGNOSIS — R419 Unspecified symptoms and signs involving cognitive functions and awareness: Secondary | ICD-10-CM | POA: Diagnosis not present

## 2023-03-20 DIAGNOSIS — F32A Depression, unspecified: Secondary | ICD-10-CM | POA: Diagnosis not present

## 2023-03-20 DIAGNOSIS — Z789 Other specified health status: Secondary | ICD-10-CM | POA: Diagnosis not present

## 2023-04-02 DIAGNOSIS — M79645 Pain in left finger(s): Secondary | ICD-10-CM | POA: Diagnosis not present

## 2023-04-07 DIAGNOSIS — M25512 Pain in left shoulder: Secondary | ICD-10-CM | POA: Diagnosis not present

## 2023-04-07 DIAGNOSIS — R296 Repeated falls: Secondary | ICD-10-CM | POA: Diagnosis not present

## 2023-04-07 DIAGNOSIS — G8929 Other chronic pain: Secondary | ICD-10-CM | POA: Diagnosis not present

## 2023-04-16 ENCOUNTER — Other Ambulatory Visit (HOSPITAL_COMMUNITY): Payer: Self-pay | Admitting: Physician Assistant

## 2023-04-16 DIAGNOSIS — M25512 Pain in left shoulder: Secondary | ICD-10-CM | POA: Diagnosis not present

## 2023-04-16 DIAGNOSIS — R296 Repeated falls: Secondary | ICD-10-CM | POA: Diagnosis not present

## 2023-04-16 DIAGNOSIS — G8929 Other chronic pain: Secondary | ICD-10-CM | POA: Diagnosis not present

## 2023-04-17 NOTE — Progress Notes (Signed)
Joshua Payor, PhD, LAT, ATC acting as a scribe for [name], MD.  Joshua [name] is a 77 y.o. male who presents to [facility] today for 100-month f/u cont'd L shoulder pain. Pt was last seen by Dr. Denyse Amass on 01/16/23 and was given a L subacromial steroid injection. His last L GH injection was on 12/31/22.  Today, pt reports the the shoulder is doing really bad. The injection lasted for about a month before wearing off. Patient states that he has issues laying, sitting, and reaching over his head. States that he is always trying to move his arm around to find a comfortable position. Patient also states that he banged his left ankle on something and since then he has a swollen ankle and foot. Patient also has an issue with his left hand middle finger. Patient had to buddy tape that finger to his ring finger when he hurt it. Since then he has been having issues with making a fist and the finger is tight.  Dx imaging: 07/14/22 C-spine & L shoulder MRI 05/21/22 C-spine & L shoulder XR  Pertinent review of systems: No fevers or chills  Relevant historical information: Emphysema.   Exam:  BP 122/80   Pulse (!) 50   Ht 5\' 11"  (1.803 m)   Wt 222 lb (100.7 kg)   SpO2 92%   BMI 30.96 kg/m  General: Well Developed, well nourished, and in no acute distress.   MSK: Left shoulder: Normal-appearing Decreased range of motion.  Pain with abduction.  Left ankle: Moderate effusion and some bruising present in the distal toes.  Tender palpation mildly medial ankle joint. Normal ankle motion.  Stable ligamentous exam.  Intact strength.  Left hand: Swelling at fourth PIP and third PIP.  Mildly tender palpation fourth PIP.  Intact motion.    Lab and Radiology Results  Procedure: Real-time Ultrasound Guided Injection of left shoulder joint glenohumeral joint and subacromial space Device: Philips Affiniti 50G Images permanently stored and available for review  in PACS Verbal informed consent obtained.  Discussed risks and benefits of procedure. Warned about infection, bleeding, hyperglycemia damage to structures among others. Patient expresses understanding and agreement Time-out conducted.   Noted no overlying erythema, induration, or other signs of local infection.   Skin prepped in a sterile fashion.   Local anesthesia: Topical Ethyl chloride.   With sterile technique and under real time ultrasound guidance: 40 mg of Kenalog and 2 mL of Marcaine were injected into the glenohumeral joint first and then the skin was again sterilized with isopropyl alcohol and the same 40 mg of Kenalog and 2 mL of Marcaine were injected into the subacromial bursa. Completed without difficulty   Pain immediately resolved suggesting accurate placement of the medication.   Advised to call if fevers/chills, erythema, induration, drainage, or persistent bleeding.   Images permanently stored and available for review in the ultrasound unit.  Impression: Technically successful ultrasound guided injection.    X-ray images left foot and ankle obtained today personally and independently interpreted  No acute fractures are visible.  Mild degenerative changes present.  Soft tissue swelling medial ankle.  Old avulsion fragment at the navicular present does not appear to be acute.  Await formal radiology review.  EXAM: LEFT HAND - COMPLETE 3+ VIEW   COMPARISON:  None Available.   FINDINGS: No acute osseous or joint abnormality. Distal interphalangeal joint osteoarthritis.   IMPRESSION: 1. No acute findings. 2. DIP joint osteoarthritis.  Electronically Signed   By: Leanna Battles M.D.   On: 03/10/2023 13:22   I, [name], personally (independently) visualized and performed the interpretation of the images attached in this note.   Assessment and Plan: 77 y.o. male with left shoulder pain.  This is a chronic issue with continued recurrences.  He has had  subacromial and glenohumeral injections and the trend has been that he needs both in order to have good symptom resolution.  He states additionally have been done a week apart but today we decided to do them both at the same time.  Additionally has an ankle swelling and pain that could be an injury.  Thought to be more like an ankle sprain.  My interpretation of x-ray does not show acute fractures but will await radiology overread.  Refer to PT as that should be helpful.  Hand injury.  X-rays done on May 6 did not show fractures.  Plan for physical therapy.  May need dedicated hand therapy.   PDMP not reviewed this encounter. Orders Placed This Encounter  Procedures   [place] LIMITED JOINT SPACE STRUCTURES UP LEFT(NO LINKED CHARGES)    Order Specific Question:   Reason for Exam (SYMPTOM  OR DIAGNOSIS REQUIRED)    Answer:   left shoulder pain    Order Specific Question:   Preferred imaging location?    Answer:   [facility] Ankle Complete Left    Standing Status:   Future    Number of Occurrences:   1    Standing Expiration Date:   04/17/2024    Order Specific Question:   Reason for Exam (SYMPTOM  OR DIAGNOSIS REQUIRED)    Answer:   Left ankle pain    Order Specific Question:   Preferred imaging location?    Answer:   [facility] Foot Complete Left    Standing Status:   Future    Number of Occurrences:   1    Standing Expiration Date:   04/17/2024    Order Specific Question:   Reason for Exam (SYMPTOM  OR DIAGNOSIS REQUIRED)    Answer:   Left foot pain    Order Specific Question:   Preferred imaging location?    Answer:   [facility]   Ambulatory referral to Physical Therapy    Referral Priority:   Routine    Referral Type:   Physical Medicine    Referral Reason:   Specialty Services Required    Requested Specialty:   Physical Therapy    Number of Visits Requested:   1   No orders of the defined types were placed in this  encounter.    Discussed warning signs or symptoms. Please see discharge instructions. Patient expresses understanding.   The above documentation has been reviewed and is accurate and complete [name], M.D.

## 2023-04-18 ENCOUNTER — Ambulatory Visit (INDEPENDENT_AMBULATORY_CARE_PROVIDER_SITE_OTHER): Payer: Medicare HMO

## 2023-04-18 ENCOUNTER — Ambulatory Visit: Payer: Medicare HMO | Admitting: Family Medicine

## 2023-04-18 ENCOUNTER — Other Ambulatory Visit: Payer: Self-pay

## 2023-04-18 VITALS — BP 122/80 | HR 50 | Ht 71.0 in | Wt 222.0 lb

## 2023-04-18 DIAGNOSIS — M79672 Pain in left foot: Secondary | ICD-10-CM

## 2023-04-18 DIAGNOSIS — M25572 Pain in left ankle and joints of left foot: Secondary | ICD-10-CM | POA: Diagnosis not present

## 2023-04-18 DIAGNOSIS — M7989 Other specified soft tissue disorders: Secondary | ICD-10-CM | POA: Diagnosis not present

## 2023-04-18 DIAGNOSIS — M79642 Pain in left hand: Secondary | ICD-10-CM

## 2023-04-18 DIAGNOSIS — G8929 Other chronic pain: Secondary | ICD-10-CM

## 2023-04-18 DIAGNOSIS — M25512 Pain in left shoulder: Secondary | ICD-10-CM

## 2023-04-18 NOTE — Patient Instructions (Signed)
Good to see you  Injections in left shoulder today Call or go to the ER if you develop a large red swollen joint with extreme pain or oozing puss.  Referral to PT sent to [facility] for the left ankle and left hand  Follow up in 4 weeks

## 2023-04-23 NOTE — Progress Notes (Signed)
Left ankle and foot x-ray show a little bit of swelling around the ankle joint and some arthritis of the big toe.

## 2023-04-23 NOTE — Progress Notes (Signed)
Left ankle and foot x-ray show a little bit of swelling around the ankle joint and some arthritis of the big toe.

## 2023-04-24 DIAGNOSIS — G8929 Other chronic pain: Secondary | ICD-10-CM | POA: Diagnosis not present

## 2023-04-24 DIAGNOSIS — R296 Repeated falls: Secondary | ICD-10-CM | POA: Diagnosis not present

## 2023-04-24 DIAGNOSIS — M25512 Pain in left shoulder: Secondary | ICD-10-CM | POA: Diagnosis not present

## 2023-04-29 DIAGNOSIS — R296 Repeated falls: Secondary | ICD-10-CM | POA: Diagnosis not present

## 2023-04-29 DIAGNOSIS — M25512 Pain in left shoulder: Secondary | ICD-10-CM | POA: Diagnosis not present

## 2023-04-29 DIAGNOSIS — G8929 Other chronic pain: Secondary | ICD-10-CM | POA: Diagnosis not present

## 2023-04-30 ENCOUNTER — Ambulatory Visit: Payer: Medicare HMO | Admitting: Physical Therapy

## 2023-05-01 ENCOUNTER — Telehealth: Payer: Self-pay | Admitting: Family Medicine

## 2023-05-01 DIAGNOSIS — R296 Repeated falls: Secondary | ICD-10-CM | POA: Diagnosis not present

## 2023-05-01 DIAGNOSIS — G8929 Other chronic pain: Secondary | ICD-10-CM

## 2023-05-01 DIAGNOSIS — M25512 Pain in left shoulder: Secondary | ICD-10-CM | POA: Diagnosis not present

## 2023-05-01 NOTE — Telephone Encounter (Signed)
Victorino Dike from [facility] called, she has already been treating pt and would like to help him with his L shoulder. Please fax L shoulder referral to her at [phone]. I think we originally sent this elsewhere, but since he is active at this location he would like to continue there.

## 2023-05-01 NOTE — Telephone Encounter (Signed)
Referral/order for PT for left shoulder has been placed.

## 2023-05-06 DIAGNOSIS — G8929 Other chronic pain: Secondary | ICD-10-CM | POA: Diagnosis not present

## 2023-05-06 DIAGNOSIS — R296 Repeated falls: Secondary | ICD-10-CM | POA: Diagnosis not present

## 2023-05-06 DIAGNOSIS — M25512 Pain in left shoulder: Secondary | ICD-10-CM | POA: Diagnosis not present

## 2023-05-08 ENCOUNTER — Encounter: Payer: Self-pay | Admitting: Cardiology

## 2023-05-13 DIAGNOSIS — M25512 Pain in left shoulder: Secondary | ICD-10-CM | POA: Diagnosis not present

## 2023-05-13 DIAGNOSIS — G8929 Other chronic pain: Secondary | ICD-10-CM | POA: Diagnosis not present

## 2023-05-13 DIAGNOSIS — R296 Repeated falls: Secondary | ICD-10-CM | POA: Diagnosis not present

## 2023-05-15 DIAGNOSIS — G8929 Other chronic pain: Secondary | ICD-10-CM | POA: Diagnosis not present

## 2023-05-15 DIAGNOSIS — M25512 Pain in left shoulder: Secondary | ICD-10-CM | POA: Diagnosis not present

## 2023-05-15 DIAGNOSIS — R296 Repeated falls: Secondary | ICD-10-CM | POA: Diagnosis not present

## 2023-05-19 DIAGNOSIS — R296 Repeated falls: Secondary | ICD-10-CM | POA: Diagnosis not present

## 2023-05-19 DIAGNOSIS — G8929 Other chronic pain: Secondary | ICD-10-CM | POA: Diagnosis not present

## 2023-05-19 DIAGNOSIS — M25512 Pain in left shoulder: Secondary | ICD-10-CM | POA: Diagnosis not present

## 2023-05-21 DIAGNOSIS — R531 Weakness: Secondary | ICD-10-CM | POA: Diagnosis not present

## 2023-05-21 DIAGNOSIS — R200 Anesthesia of skin: Secondary | ICD-10-CM | POA: Diagnosis not present

## 2023-05-21 DIAGNOSIS — M25512 Pain in left shoulder: Secondary | ICD-10-CM | POA: Diagnosis not present

## 2023-05-21 DIAGNOSIS — R2689 Other abnormalities of gait and mobility: Secondary | ICD-10-CM | POA: Diagnosis not present

## 2023-05-24 ENCOUNTER — Other Ambulatory Visit: Payer: Self-pay | Admitting: Family Medicine

## 2023-05-26 NOTE — Telephone Encounter (Signed)
Rx refill request approved per Dr. Corey's orders. 

## 2023-05-27 DIAGNOSIS — Z85828 Personal history of other malignant neoplasm of skin: Secondary | ICD-10-CM | POA: Diagnosis not present

## 2023-05-27 DIAGNOSIS — L570 Actinic keratosis: Secondary | ICD-10-CM | POA: Diagnosis not present

## 2023-05-27 DIAGNOSIS — L258 Unspecified contact dermatitis due to other agents: Secondary | ICD-10-CM | POA: Diagnosis not present

## 2023-05-27 DIAGNOSIS — L820 Inflamed seborrheic keratosis: Secondary | ICD-10-CM | POA: Diagnosis not present

## 2023-05-28 ENCOUNTER — Other Ambulatory Visit: Payer: Self-pay | Admitting: Family Medicine

## 2023-05-28 ENCOUNTER — Telehealth: Payer: Self-pay | Admitting: Family Medicine

## 2023-05-28 MED ORDER — PREGABALIN 75 MG PO CAPS: 75.0000 mg | ORAL_CAPSULE | Freq: Two times a day (BID) | ORAL | 2 refills | Status: AC

## 2023-05-28 NOTE — Telephone Encounter (Signed)
Pt informed of refill. °

## 2023-05-28 NOTE — Telephone Encounter (Signed)
Lyrica is refilled.

## 2023-05-28 NOTE — Telephone Encounter (Signed)
Pt is really having trouble with his shoulder. States his [facility] contacted [place] for a refill of Lyrica, but I do not see this being done ( discontinued?). Pt has been without for a couple days.  FYI, I schedule an appt for him tomorrow.

## 2023-05-29 ENCOUNTER — Encounter: Payer: Self-pay | Admitting: Family Medicine

## 2023-05-29 ENCOUNTER — Ambulatory Visit (INDEPENDENT_AMBULATORY_CARE_PROVIDER_SITE_OTHER): Payer: Medicare HMO

## 2023-05-29 ENCOUNTER — Ambulatory Visit: Payer: Medicare HMO | Admitting: Family Medicine

## 2023-05-29 VITALS — BP 104/74 | HR 47 | Ht 71.0 in | Wt 220.0 lb

## 2023-05-29 DIAGNOSIS — M19012 Primary osteoarthritis, left shoulder: Secondary | ICD-10-CM | POA: Diagnosis not present

## 2023-05-29 DIAGNOSIS — M25512 Pain in left shoulder: Secondary | ICD-10-CM | POA: Diagnosis not present

## 2023-05-29 DIAGNOSIS — G8929 Other chronic pain: Secondary | ICD-10-CM | POA: Diagnosis not present

## 2023-05-29 DIAGNOSIS — R2689 Other abnormalities of gait and mobility: Secondary | ICD-10-CM | POA: Diagnosis not present

## 2023-05-29 DIAGNOSIS — R531 Weakness: Secondary | ICD-10-CM | POA: Diagnosis not present

## 2023-05-29 DIAGNOSIS — R200 Anesthesia of skin: Secondary | ICD-10-CM | POA: Diagnosis not present

## 2023-05-29 NOTE — Progress Notes (Signed)
   I, Stevenson Clinch, CMA acting as a scribe for Joshua Graham, MD.  Joshua [name] is a 77 y.o. male who presents to [facility] at [facility] today for cont'd L shoulder pain. Pt was last seen by Dr. Denyse Amass on 04/18/23 and was given a L GH and subacromial steroid injections. Pt completed a course of PT at [facility] in [place], completing 10 visits (d/c July 17th).  Today, pt reports continued left shoulder pain. Minimal improvement after 10 visits with PT. Was seen by PT today, she had concerns about rotator cuff injury. Feels snapping sensation in the shoulder. Sx causing night disturbance. Taking Tylenol prn. Also notes pain in the hand and fingers of the left hand, primarily the thumb, 3rd and 4th fingers.   Dx imaging: 07/14/22 C-spine & L shoulder MRI 05/21/22 C-spine & L shoulder XR  Pertinent review of systems: No's or chills  Relevant historical information: Heart disease   Exam:  BP 104/74   Pulse (!) 47   Ht 5\' 11"  (1.803 m)   Wt 220 lb (99.8 kg)   SpO2 94%   BMI 30.68 kg/m  General: Well Developed, well nourished, and in no acute distress.   MSK: Left shoulder: Normal appearing.  Decreased range of motion.    Lab and Radiology Results  X-ray images left shoulder obtained today personally and independently interpreted Mild glenohumeral DJD.  No acute fractures are visible. Await formal radiology review     Assessment and Plan: 77 y.o. male with left shoulder pain thought to be due to rotator cuff tear seen on prior MRI a year ago and glenohumeral arthritis.  Unfortunately she has not responded to conservative management including physical therapy and glenohumeral and subacromial injection.  Surgery is probably a reasonable next step for him.  His MRI is old and out of date now.  Update x-ray and MRI and anticipate referral to orthopedic surgery for evaluation and consultation.  I warned him that it is possible he may require reverse total  shoulder replacement.   PDMP not reviewed this encounter. Orders Placed This Encounter  Procedures   MR SHOULDER LEFT WO CONTRAST    Standing Status:   Future    Standing Expiration Date:   05/28/2024    Order Specific Question:   What is the patient's sedation requirement?    Answer:   No Sedation    Order Specific Question:   Does the patient have a pacemaker or implanted devices?    Answer:   No    Order Specific Question:   Preferred imaging location?    Answer:   [facility] (table limit-350lbs)   DG Shoulder Left    Standing Status:   Future    Number of Occurrences:   1    Standing Expiration Date:   05/28/2024    Order Specific Question:   Reason for Exam (SYMPTOM  OR DIAGNOSIS REQUIRED)    Answer:   eval shoulder pain    Order Specific Question:   Preferred imaging location?    Answer:   [facility]   No orders of the defined types were placed in this encounter.    Discussed warning signs or symptoms. Please see discharge instructions. Patient expresses understanding.   The above documentation has been reviewed and is accurate and complete Joshua [name], M.D.

## 2023-05-29 NOTE — Patient Instructions (Addendum)
Thank you for coming in today.   Please get an Xray today before you leave   You should hear from MRI scheduling within 1 week. If you do not hear please let me know.    Check back after MRI

## 2023-06-01 ENCOUNTER — Ambulatory Visit (INDEPENDENT_AMBULATORY_CARE_PROVIDER_SITE_OTHER): Payer: Medicare HMO

## 2023-06-01 DIAGNOSIS — M25512 Pain in left shoulder: Secondary | ICD-10-CM | POA: Diagnosis not present

## 2023-06-01 DIAGNOSIS — G8929 Other chronic pain: Secondary | ICD-10-CM | POA: Diagnosis not present

## 2023-06-01 DIAGNOSIS — M948X1 Other specified disorders of cartilage, shoulder: Secondary | ICD-10-CM | POA: Diagnosis not present

## 2023-06-01 DIAGNOSIS — M19012 Primary osteoarthritis, left shoulder: Secondary | ICD-10-CM | POA: Diagnosis not present

## 2023-06-04 ENCOUNTER — Inpatient Hospital Stay: Admission: RE | Admit: 2023-06-04 | Payer: Medicare HMO | Source: Ambulatory Visit

## 2023-06-04 DIAGNOSIS — Z23 Encounter for immunization: Secondary | ICD-10-CM | POA: Diagnosis not present

## 2023-06-04 DIAGNOSIS — M25551 Pain in right hip: Secondary | ICD-10-CM | POA: Diagnosis not present

## 2023-06-04 DIAGNOSIS — R55 Syncope and collapse: Secondary | ICD-10-CM | POA: Diagnosis not present

## 2023-06-04 DIAGNOSIS — F1011 Alcohol abuse, in remission: Secondary | ICD-10-CM | POA: Diagnosis not present

## 2023-06-04 DIAGNOSIS — M79662 Pain in left lower leg: Secondary | ICD-10-CM | POA: Diagnosis not present

## 2023-06-04 DIAGNOSIS — Z6830 Body mass index (BMI) 30.0-30.9, adult: Secondary | ICD-10-CM | POA: Diagnosis not present

## 2023-06-04 DIAGNOSIS — I6523 Occlusion and stenosis of bilateral carotid arteries: Secondary | ICD-10-CM | POA: Diagnosis not present

## 2023-06-04 DIAGNOSIS — S01312A Laceration without foreign body of left ear, initial encounter: Secondary | ICD-10-CM | POA: Diagnosis not present

## 2023-06-04 DIAGNOSIS — W1830XA Fall on same level, unspecified, initial encounter: Secondary | ICD-10-CM | POA: Diagnosis not present

## 2023-06-04 DIAGNOSIS — S065X0A Traumatic subdural hemorrhage without loss of consciousness, initial encounter: Secondary | ICD-10-CM | POA: Diagnosis not present

## 2023-06-04 DIAGNOSIS — S065X9A Traumatic subdural hemorrhage with loss of consciousness of unspecified duration, initial encounter: Secondary | ICD-10-CM | POA: Diagnosis not present

## 2023-06-04 DIAGNOSIS — E669 Obesity, unspecified: Secondary | ICD-10-CM | POA: Diagnosis not present

## 2023-06-04 DIAGNOSIS — M79632 Pain in left forearm: Secondary | ICD-10-CM | POA: Diagnosis not present

## 2023-06-04 DIAGNOSIS — J439 Emphysema, unspecified: Secondary | ICD-10-CM | POA: Diagnosis not present

## 2023-06-04 DIAGNOSIS — R296 Repeated falls: Secondary | ICD-10-CM | POA: Diagnosis not present

## 2023-06-04 DIAGNOSIS — S065XAA Traumatic subdural hemorrhage with loss of consciousness status unknown, initial encounter: Secondary | ICD-10-CM | POA: Insufficient documentation

## 2023-06-04 DIAGNOSIS — M79622 Pain in left upper arm: Secondary | ICD-10-CM | POA: Diagnosis not present

## 2023-06-04 DIAGNOSIS — F1010 Alcohol abuse, uncomplicated: Secondary | ICD-10-CM | POA: Diagnosis not present

## 2023-06-04 DIAGNOSIS — M79652 Pain in left thigh: Secondary | ICD-10-CM | POA: Diagnosis not present

## 2023-06-04 DIAGNOSIS — R001 Bradycardia, unspecified: Secondary | ICD-10-CM | POA: Diagnosis not present

## 2023-06-04 DIAGNOSIS — Z043 Encounter for examination and observation following other accident: Secondary | ICD-10-CM | POA: Diagnosis not present

## 2023-06-04 DIAGNOSIS — Z96641 Presence of right artificial hip joint: Secondary | ICD-10-CM | POA: Diagnosis not present

## 2023-06-05 DIAGNOSIS — R001 Bradycardia, unspecified: Secondary | ICD-10-CM | POA: Diagnosis not present

## 2023-06-05 DIAGNOSIS — Z8669 Personal history of other diseases of the nervous system and sense organs: Secondary | ICD-10-CM | POA: Diagnosis not present

## 2023-06-05 DIAGNOSIS — N400 Enlarged prostate without lower urinary tract symptoms: Secondary | ICD-10-CM | POA: Insufficient documentation

## 2023-06-05 DIAGNOSIS — I480 Paroxysmal atrial fibrillation: Secondary | ICD-10-CM | POA: Diagnosis not present

## 2023-06-05 DIAGNOSIS — W19XXXA Unspecified fall, initial encounter: Secondary | ICD-10-CM | POA: Diagnosis not present

## 2023-06-05 DIAGNOSIS — S065XAA Traumatic subdural hemorrhage with loss of consciousness status unknown, initial encounter: Secondary | ICD-10-CM | POA: Diagnosis not present

## 2023-06-05 DIAGNOSIS — F419 Anxiety disorder, unspecified: Secondary | ICD-10-CM | POA: Diagnosis not present

## 2023-06-05 DIAGNOSIS — E785 Hyperlipidemia, unspecified: Secondary | ICD-10-CM | POA: Diagnosis not present

## 2023-06-05 DIAGNOSIS — S065X9A Traumatic subdural hemorrhage with loss of consciousness of unspecified duration, initial encounter: Secondary | ICD-10-CM | POA: Diagnosis not present

## 2023-06-05 DIAGNOSIS — R55 Syncope and collapse: Secondary | ICD-10-CM | POA: Diagnosis not present

## 2023-06-06 DIAGNOSIS — R55 Syncope and collapse: Secondary | ICD-10-CM | POA: Diagnosis not present

## 2023-06-06 DIAGNOSIS — I082 Rheumatic disorders of both aortic and tricuspid valves: Secondary | ICD-10-CM | POA: Diagnosis not present

## 2023-06-06 DIAGNOSIS — Z23 Encounter for immunization: Secondary | ICD-10-CM | POA: Diagnosis not present

## 2023-06-09 ENCOUNTER — Telehealth: Payer: Self-pay | Admitting: *Deleted

## 2023-06-09 ENCOUNTER — Other Ambulatory Visit: Payer: Self-pay

## 2023-06-09 ENCOUNTER — Encounter: Payer: Self-pay | Admitting: *Deleted

## 2023-06-09 DIAGNOSIS — G8929 Other chronic pain: Secondary | ICD-10-CM

## 2023-06-09 NOTE — Progress Notes (Signed)
Left shoulder x-ray shows a little bit of arthritis especially the top of the shoulder.

## 2023-06-09 NOTE — Transitions of Care (Post Inpatient/ED Visit) (Signed)
06/09/2023  Name: Joshua [name] MRN: [id number] DOB: 03-09-1946  Today's TOC FU Call Status: Today's TOC FU Call Status:: Successful TOC FU Call Completed TOC FU Call Complete Date: 06/09/23  Transition Care Management Follow-up Telephone Call Date of Discharge: 06/06/23 Discharge Facility: Other ([facility] Facility) Name of Other ([facility]) Discharge Facility: [facility] Type of Discharge: Inpatient Admission Primary Inpatient Discharge Diagnosis:: near syncope, bradycardia; anxiety How have you been since you were released from the hospital?: Better (per spouse: "He is doing okay and we are not having any problems.  I am monitoring his heart rate at home and  it is staying in the 50's which is where they wanted it.") Any questions or concerns?: No  Items Reviewed: Did you receive and understand the discharge instructions provided?: Yes (briefly reviewed with patient's spouse who verbalizes good understanding of same - outside hospital AVS) Medications obtained,verified, and reconciled?: Yes (Medications Reviewed) (Full medication reconciliation/ review completed; no concerns or discrepancies identified; confirmed patient obtained/ is taking all newly Rx'd medications as instructed; self-manages medications and denies questions/ concerns around medications today) Any new allergies since your discharge?: No Dietary orders reviewed?: Yes Type of Diet Ordered:: "Regular, but as healthy as possible" Do you have support at home?: Yes People in Home: spouse Name of Support/Comfort Primary Source: Reports independent in self-care activities; supportive spouse assists as/ if needed/ indicated  Medications Reviewed Today: Medications Reviewed Today     Reviewed by Michaela Corner, RN (Registered Nurse) on 06/09/23 at 1553  Med List Status: <None>   Medication Order Taking? Sig Documenting Provider Last Dose Status Informant  amiodarone (PACERONE) 200 MG tablet [id number]  TAKE 1/2 TABLET BY  MOUTH DAILY Fenton, [place] R, PA  Active            Med Note Marilu Favre Jun 09, 2023  3:41 PM) 06/09/23: Reports during TOC call this medication was discontinued during recent hospitalization at [facility]  apixaban (ELIQUIS) 5 MG TABS tablet [id number] Yes Take 5 mg by mouth 2 (two) times daily. [provider] Taking Active Self  atorvastatin (LIPITOR) 40 MG tablet [id number] Yes TAKE ONE TABLET BY MOUTH DAILY Tobb, Kardie, DO Taking Active   clobetasol (TEMOVATE) 0.05 % external solution [id number] Yes Apply 1 application topically once a week. Use in hair [provider] Taking Active Self  Docusate Sodium (COLACE PO) [id number] Yes Take 1 tablet by mouth in the morning and at bedtime. [provider] Taking Active   fluorouracil (EFUDEX) 5 % cream [id number] Yes Apply 1 application topically daily as needed (On head over night). [provider] Taking Active Self  FLUoxetine (PROZAC) 40 MG capsule [id number] Yes Take 40 mg by mouth daily. [provider] Taking Active   ketoconazole (NIZORAL) 2 % shampoo [id number] Yes Apply 1 application topically 2 (two) times a week. Monday and Thursday [provider] Taking Active Self  lamoTRIgine (LAMICTAL) 200 MG tablet [id number] Yes Take 100 mg by mouth 2 (two) times daily. [provider] Taking Active Self  metoprolol succinate (TOPROL XL) 25 MG 24 hr tablet [id number]  Take 0.5 tablets (12.5 mg total) by mouth daily. Thomasene Ripple, DO  Active            Med Note Michaela Corner   Mon Jun 09, 2023  3:44 PM) 06/09/23: Reports during [facility] call this medication was discontinued during recent hospitalization at [facility]   naltrexone (DEPADE) 50 MG tablet [id number] Yes Take 50  mg by mouth every morning. [provider] Taking Active   pregabalin (LYRICA) 75 MG capsule [id number] Yes Take 1 capsule (75 mg total) by mouth 2 (two) times daily. Rodolph Bong, MD Taking Active            Med Note  Marilu Favre Jun 09, 2023  3:42 PM) 06/09/23: Reports during [facility] call takes this medication every day only-- not BID   tamsulosin (FLOMAX) 0.4 MG CAPS capsule [id number] Yes Take 0.4 mg by mouth every other day. [provider] Taking Active Self           Med Note Marilu Favre Jun 09, 2023  3:43 PM) 06/09/23: Reports during [facility] call takes this medication QD             Home Care and Equipment/Supplies: Were Home Health Services Ordered?: No Any new equipment or medical supplies ordered?: No  Functional Questionnaire: Do you need assistance with bathing/showering or dressing?: No Do you need assistance with meal preparation?: No Do you need assistance with eating?: No Do you have difficulty maintaining continence: No Do you need assistance with getting out of bed/getting out of a chair/moving?: No Do you have difficulty managing or taking your medications?: No  Follow up appointments reviewed: PCP Follow-up appointment confirmed?: Yes Date of PCP follow-up appointment?: 06/10/23 Follow-up Provider: PCP Specialist Hospital Follow-up appointment confirmed?: Yes Date of Specialist follow-up appointment?: 06/13/23 Follow-Up Specialty Provider:: cardiologist Do you need transportation to your follow-up appointment?: No Do you understand care options if your condition(s) worsen?: Yes-patient verbalized understanding  SDOH Interventions Today    Flowsheet Row Most Recent Value  SDOH Interventions   Food Insecurity Interventions Intervention Not Indicated  Transportation Interventions Intervention Not Indicated  [drives self at baseline,  wife assists as needed]      TOC Interventions Today    Flowsheet Row Most Recent Value  TOC Interventions   TOC Interventions Discussed/Reviewed TOC Interventions Discussed      Interventions Today    Flowsheet Row Most Recent Value  Chronic Disease   Chronic disease during today's visit Other  [bradycardia,   near syncope]  General Interventions   General Interventions Discussed/Reviewed General Interventions Discussed, Durable Medical Equipment (DME), Doctor Visits  Doctor Visits Discussed/Reviewed Doctor Visits Discussed, PCP, Specialist  Durable Medical Equipment (DME) Other  [confirmed not currently requiring/ using assistive devices]  PCP/Specialist Visits Compliance with follow-up visit  Nutrition Interventions   Nutrition Discussed/Reviewed Nutrition Discussed  Pharmacy Interventions   Pharmacy Dicussed/Reviewed Pharmacy Topics Discussed  [Full medication review with updating medication list in EHR per patient report]  Safety Interventions   Safety Discussed/Reviewed Safety Discussed      Caryl Pina, RN, BSN, CCRN Alumnus RN CM Care Coordination/ Transition of Care- [facility] Care Management [phone]: direct office

## 2023-06-09 NOTE — Progress Notes (Signed)
MRI shows worsening rotator cuff tear with in places full-thickness retracted rotator cuff tears.  Additionally the muscle of the rotator cuff tendon comes from his starting to atrophy.  You have medium arthritis in the main shoulder joint.  This may require a shoulder replacement. I have referred you to Dr. Steward Drone.  He is a very good Counselling psychologist.  You should hear from his office soon.

## 2023-06-10 ENCOUNTER — Ambulatory Visit (INDEPENDENT_AMBULATORY_CARE_PROVIDER_SITE_OTHER): Payer: Medicare HMO | Admitting: Medical-Surgical

## 2023-06-10 ENCOUNTER — Encounter: Payer: Self-pay | Admitting: Medical-Surgical

## 2023-06-10 VITALS — BP 96/60 | HR 46 | Ht 71.0 in | Wt 221.0 lb

## 2023-06-10 DIAGNOSIS — M25512 Pain in left shoulder: Secondary | ICD-10-CM | POA: Diagnosis not present

## 2023-06-10 DIAGNOSIS — R2689 Other abnormalities of gait and mobility: Secondary | ICD-10-CM | POA: Diagnosis not present

## 2023-06-10 DIAGNOSIS — Z4802 Encounter for removal of sutures: Secondary | ICD-10-CM | POA: Diagnosis not present

## 2023-06-10 DIAGNOSIS — F1094 Alcohol use, unspecified with alcohol-induced mood disorder: Secondary | ICD-10-CM | POA: Insufficient documentation

## 2023-06-10 DIAGNOSIS — R200 Anesthesia of skin: Secondary | ICD-10-CM | POA: Diagnosis not present

## 2023-06-10 DIAGNOSIS — R531 Weakness: Secondary | ICD-10-CM | POA: Diagnosis not present

## 2023-06-10 NOTE — Progress Notes (Unsigned)
        Established patient visit  History, exam, impression, and plan:  1. Visit for suture removal Pleasant 77 year old male accompanied by his wife presenting today for suture removal.  [place] that involved head trauma and a left ear laceration.  He was evaluated at the emergency room and treated accordingly.  Today, he reports that his ear has been itchy but he has had no pain, fever, purulent drainage, or swelling to the area.  The original gauze bandage is still in place and they have not removed it for site care. Dressing removed. Three sutures intact to the lower ear lobe. No signs of infection. Sutures removed successfully. Patient tolerated well. One small area noted on the lateral edge of the earlobe that was open with the edges not well approximated. No bleeding noted. Dermabond applied to facilitate closure and provide support given the location of the wound.  Advised to monitor for signs/symptoms of infection.  Reviewed site care recommendations with Dermabond in place.   Procedures performed this visit: None.  Return if symptoms worsen or fail to improve.  __________________________________ Thayer Ohm, DNP, APRN, FNP-BC Primary Care and Sports Medicine [facility] [place]

## 2023-06-13 ENCOUNTER — Ambulatory Visit (HOSPITAL_BASED_OUTPATIENT_CLINIC_OR_DEPARTMENT_OTHER): Payer: Medicare HMO | Admitting: Orthopaedic Surgery

## 2023-06-13 DIAGNOSIS — I480 Paroxysmal atrial fibrillation: Secondary | ICD-10-CM | POA: Diagnosis not present

## 2023-06-13 DIAGNOSIS — I2510 Atherosclerotic heart disease of native coronary artery without angina pectoris: Secondary | ICD-10-CM | POA: Diagnosis not present

## 2023-06-13 DIAGNOSIS — I4510 Unspecified right bundle-branch block: Secondary | ICD-10-CM | POA: Diagnosis not present

## 2023-06-13 DIAGNOSIS — E785 Hyperlipidemia, unspecified: Secondary | ICD-10-CM | POA: Diagnosis not present

## 2023-06-13 DIAGNOSIS — R001 Bradycardia, unspecified: Secondary | ICD-10-CM | POA: Diagnosis not present

## 2023-06-13 DIAGNOSIS — I10 Essential (primary) hypertension: Secondary | ICD-10-CM | POA: Diagnosis not present

## 2023-06-16 DIAGNOSIS — Z09 Encounter for follow-up examination after completed treatment for conditions other than malignant neoplasm: Secondary | ICD-10-CM | POA: Diagnosis not present

## 2023-06-16 DIAGNOSIS — I082 Rheumatic disorders of both aortic and tricuspid valves: Secondary | ICD-10-CM | POA: Diagnosis not present

## 2023-06-16 DIAGNOSIS — D122 Benign neoplasm of ascending colon: Secondary | ICD-10-CM | POA: Diagnosis not present

## 2023-06-16 DIAGNOSIS — Z86010 Personal history of colonic polyps: Secondary | ICD-10-CM | POA: Diagnosis not present

## 2023-06-16 DIAGNOSIS — K648 Other hemorrhoids: Secondary | ICD-10-CM | POA: Diagnosis not present

## 2023-06-17 DIAGNOSIS — R200 Anesthesia of skin: Secondary | ICD-10-CM | POA: Diagnosis not present

## 2023-06-17 DIAGNOSIS — R531 Weakness: Secondary | ICD-10-CM | POA: Diagnosis not present

## 2023-06-17 DIAGNOSIS — M25512 Pain in left shoulder: Secondary | ICD-10-CM | POA: Diagnosis not present

## 2023-06-17 DIAGNOSIS — R2689 Other abnormalities of gait and mobility: Secondary | ICD-10-CM | POA: Diagnosis not present

## 2023-06-19 DIAGNOSIS — R2689 Other abnormalities of gait and mobility: Secondary | ICD-10-CM | POA: Diagnosis not present

## 2023-06-19 DIAGNOSIS — R200 Anesthesia of skin: Secondary | ICD-10-CM | POA: Diagnosis not present

## 2023-06-19 DIAGNOSIS — M25512 Pain in left shoulder: Secondary | ICD-10-CM | POA: Diagnosis not present

## 2023-06-19 DIAGNOSIS — R531 Weakness: Secondary | ICD-10-CM | POA: Diagnosis not present

## 2023-06-23 DIAGNOSIS — M25512 Pain in left shoulder: Secondary | ICD-10-CM | POA: Diagnosis not present

## 2023-06-23 DIAGNOSIS — R200 Anesthesia of skin: Secondary | ICD-10-CM | POA: Diagnosis not present

## 2023-06-23 DIAGNOSIS — R2689 Other abnormalities of gait and mobility: Secondary | ICD-10-CM | POA: Diagnosis not present

## 2023-06-23 DIAGNOSIS — R531 Weakness: Secondary | ICD-10-CM | POA: Diagnosis not present

## 2023-06-24 ENCOUNTER — Other Ambulatory Visit: Payer: Self-pay | Admitting: Orthopaedic Surgery

## 2023-06-24 DIAGNOSIS — Z01818 Encounter for other preprocedural examination: Secondary | ICD-10-CM

## 2023-06-24 DIAGNOSIS — M24111 Other articular cartilage disorders, right shoulder: Secondary | ICD-10-CM | POA: Diagnosis not present

## 2023-06-26 DIAGNOSIS — R2689 Other abnormalities of gait and mobility: Secondary | ICD-10-CM | POA: Diagnosis not present

## 2023-06-26 DIAGNOSIS — R200 Anesthesia of skin: Secondary | ICD-10-CM | POA: Diagnosis not present

## 2023-06-26 DIAGNOSIS — M25512 Pain in left shoulder: Secondary | ICD-10-CM | POA: Diagnosis not present

## 2023-06-26 DIAGNOSIS — R531 Weakness: Secondary | ICD-10-CM | POA: Diagnosis not present

## 2023-06-29 LAB — HM COLONOSCOPY

## 2023-06-30 DIAGNOSIS — M25512 Pain in left shoulder: Secondary | ICD-10-CM | POA: Diagnosis not present

## 2023-06-30 DIAGNOSIS — R200 Anesthesia of skin: Secondary | ICD-10-CM | POA: Diagnosis not present

## 2023-06-30 DIAGNOSIS — R531 Weakness: Secondary | ICD-10-CM | POA: Diagnosis not present

## 2023-06-30 DIAGNOSIS — R2689 Other abnormalities of gait and mobility: Secondary | ICD-10-CM | POA: Diagnosis not present

## 2023-07-10 DIAGNOSIS — R531 Weakness: Secondary | ICD-10-CM | POA: Diagnosis not present

## 2023-07-10 DIAGNOSIS — R200 Anesthesia of skin: Secondary | ICD-10-CM | POA: Diagnosis not present

## 2023-07-10 DIAGNOSIS — R2689 Other abnormalities of gait and mobility: Secondary | ICD-10-CM | POA: Diagnosis not present

## 2023-07-10 DIAGNOSIS — M25512 Pain in left shoulder: Secondary | ICD-10-CM | POA: Diagnosis not present

## 2023-07-11 ENCOUNTER — Ambulatory Visit
Admission: RE | Admit: 2023-07-11 | Discharge: 2023-07-11 | Disposition: A | Discharge status: Home / Self Care | Payer: Medicare HMO | Source: Ambulatory Visit | Attending: Orthopaedic Surgery | Admitting: Orthopaedic Surgery

## 2023-07-11 DIAGNOSIS — Z01818 Encounter for other preprocedural examination: Secondary | ICD-10-CM

## 2023-07-11 DIAGNOSIS — G8929 Other chronic pain: Secondary | ICD-10-CM | POA: Diagnosis not present

## 2023-07-11 DIAGNOSIS — M25512 Pain in left shoulder: Secondary | ICD-10-CM | POA: Diagnosis not present

## 2023-07-11 DIAGNOSIS — M19012 Primary osteoarthritis, left shoulder: Secondary | ICD-10-CM | POA: Diagnosis not present

## 2023-07-24 ENCOUNTER — Encounter: Payer: Self-pay | Admitting: Family Medicine

## 2023-07-28 DIAGNOSIS — I495 Sick sinus syndrome: Secondary | ICD-10-CM | POA: Diagnosis not present

## 2023-07-28 DIAGNOSIS — R001 Bradycardia, unspecified: Secondary | ICD-10-CM | POA: Diagnosis not present

## 2023-08-08 DIAGNOSIS — R55 Syncope and collapse: Secondary | ICD-10-CM | POA: Diagnosis not present

## 2023-08-08 DIAGNOSIS — I471 Supraventricular tachycardia, unspecified: Secondary | ICD-10-CM | POA: Diagnosis not present

## 2023-08-08 DIAGNOSIS — R001 Bradycardia, unspecified: Secondary | ICD-10-CM | POA: Diagnosis not present

## 2023-08-08 DIAGNOSIS — I480 Paroxysmal atrial fibrillation: Secondary | ICD-10-CM | POA: Diagnosis not present

## 2023-08-08 DIAGNOSIS — R42 Dizziness and giddiness: Secondary | ICD-10-CM | POA: Diagnosis not present

## 2023-08-13 DIAGNOSIS — I2510 Atherosclerotic heart disease of native coronary artery without angina pectoris: Secondary | ICD-10-CM | POA: Diagnosis not present

## 2023-08-13 DIAGNOSIS — Z9181 History of falling: Secondary | ICD-10-CM | POA: Diagnosis not present

## 2023-08-13 DIAGNOSIS — Z79899 Other long term (current) drug therapy: Secondary | ICD-10-CM | POA: Diagnosis not present

## 2023-08-13 DIAGNOSIS — I495 Sick sinus syndrome: Secondary | ICD-10-CM | POA: Diagnosis not present

## 2023-08-13 DIAGNOSIS — I480 Paroxysmal atrial fibrillation: Secondary | ICD-10-CM | POA: Diagnosis not present

## 2023-08-13 DIAGNOSIS — I10 Essential (primary) hypertension: Secondary | ICD-10-CM | POA: Diagnosis not present

## 2023-08-13 DIAGNOSIS — R55 Syncope and collapse: Secondary | ICD-10-CM | POA: Diagnosis not present

## 2023-08-13 DIAGNOSIS — I471 Supraventricular tachycardia, unspecified: Secondary | ICD-10-CM | POA: Diagnosis not present

## 2023-08-13 DIAGNOSIS — G4733 Obstructive sleep apnea (adult) (pediatric): Secondary | ICD-10-CM | POA: Diagnosis not present

## 2023-08-13 DIAGNOSIS — Z7901 Long term (current) use of anticoagulants: Secondary | ICD-10-CM | POA: Diagnosis not present

## 2023-08-26 ENCOUNTER — Other Ambulatory Visit: Payer: Self-pay | Admitting: Cardiology

## 2023-08-28 ENCOUNTER — Other Ambulatory Visit (HOSPITAL_COMMUNITY): Payer: Self-pay | Admitting: Physician Assistant

## 2023-09-01 DIAGNOSIS — R001 Bradycardia, unspecified: Secondary | ICD-10-CM | POA: Diagnosis not present

## 2023-09-05 DIAGNOSIS — M24111 Other articular cartilage disorders, right shoulder: Secondary | ICD-10-CM | POA: Diagnosis not present

## 2023-09-08 ENCOUNTER — Telehealth: Payer: Self-pay | Admitting: Pulmonary Disease

## 2023-09-08 NOTE — Telephone Encounter (Signed)
Fax received from Dr. Everardo Pacific with [facility] to perform a left reverse total shoulder replacement on patient under general anesthesia with interscalene block.  Patient needs surgery clearance. Surgery is . Patient was seen on 10/10/22. Office protocol is a risk assessment can be sent to surgeon if patient has been seen in 60 days or less.   Pt needs appt for risk assessment. I called the pt and there was no answer- LMTCB.

## 2023-09-14 IMAGING — DX DG HIP (WITH OR WITHOUT PELVIS) 2-3V*R*
3 series · 3 of 3 positions shown · non-contrast
Comparison: None.

CLINICAL DATA: Right hip pain for a month without injury.

EXAM:
DG HIP (WITH OR WITHOUT PELVIS) 2-3V RIGHT

[pelvis ap]
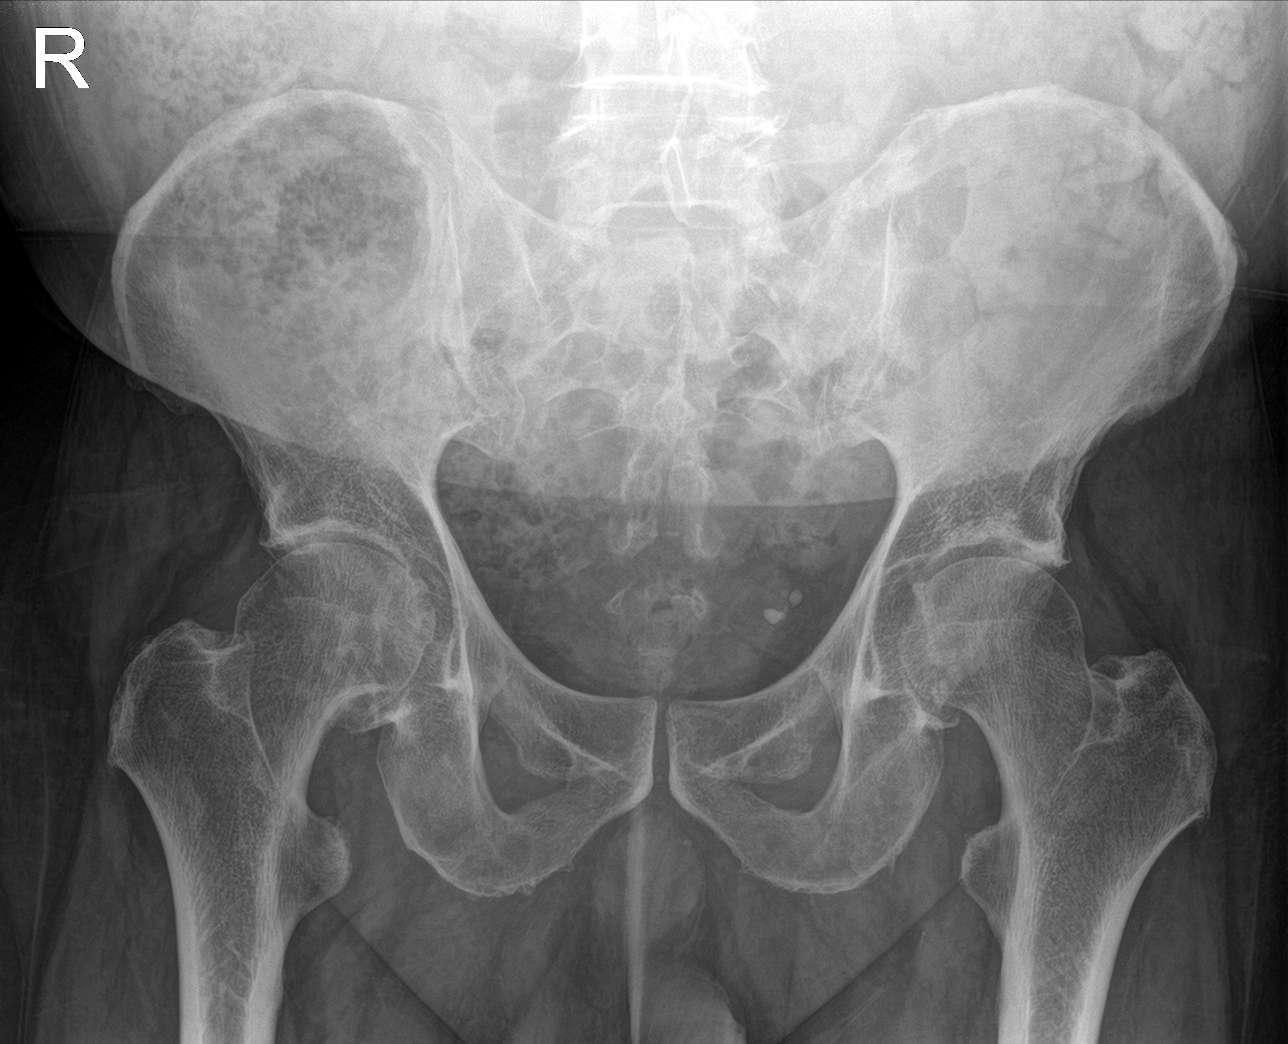

[hip ap]
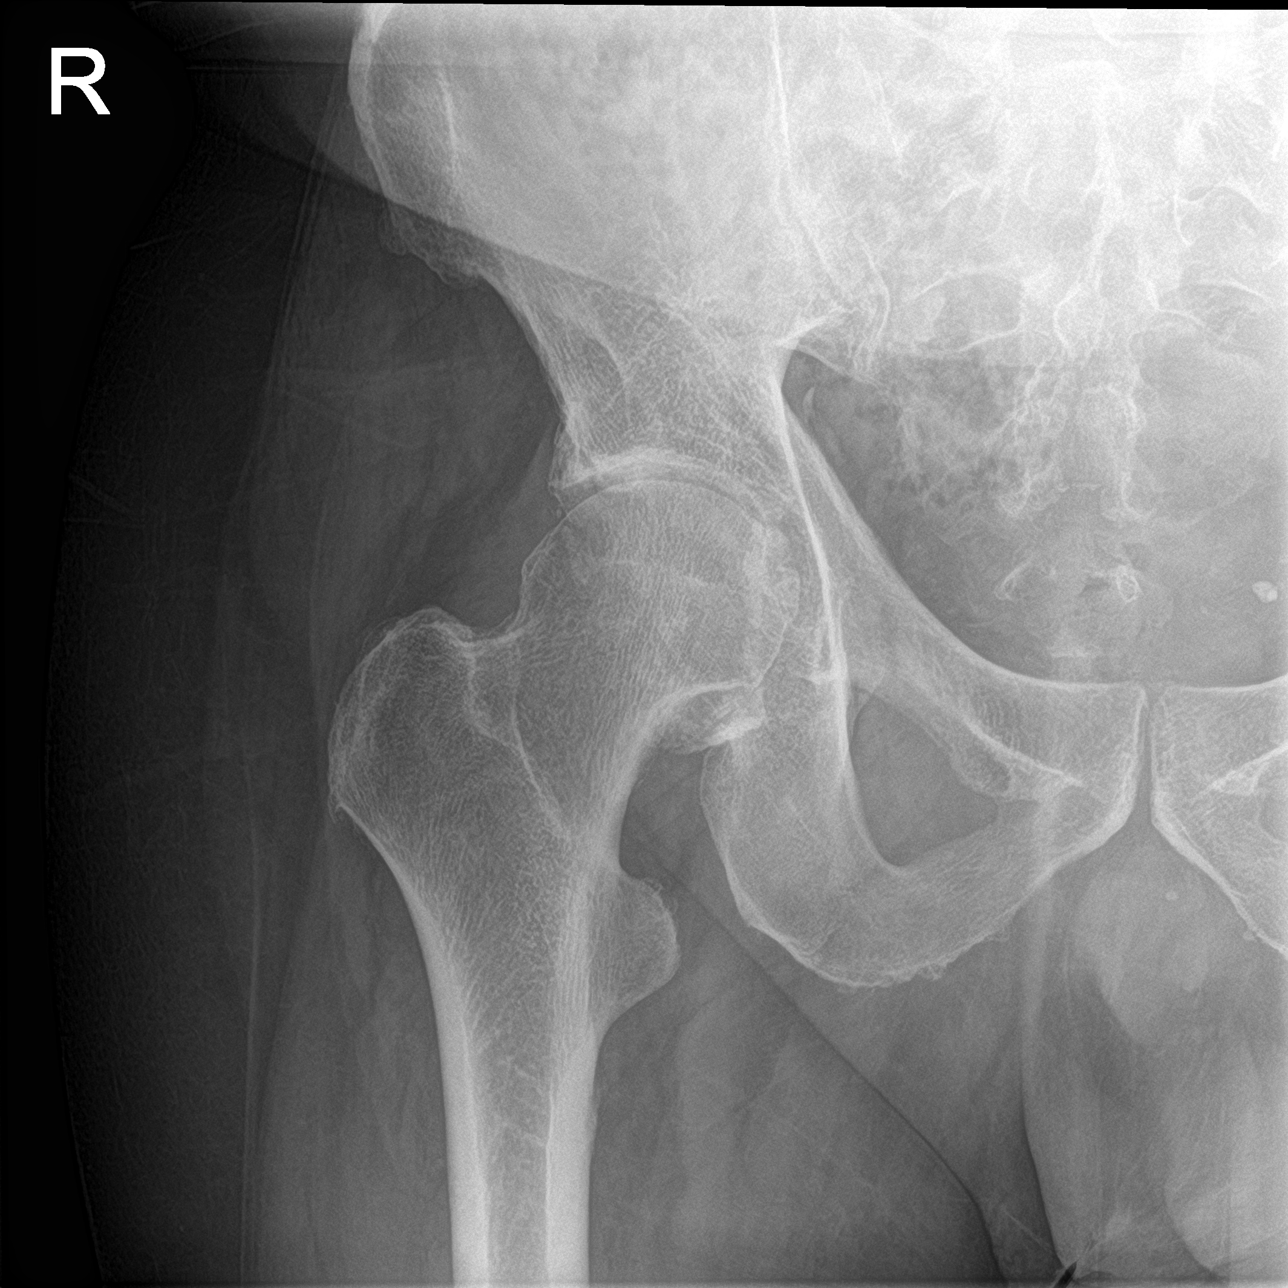

[hip lat]
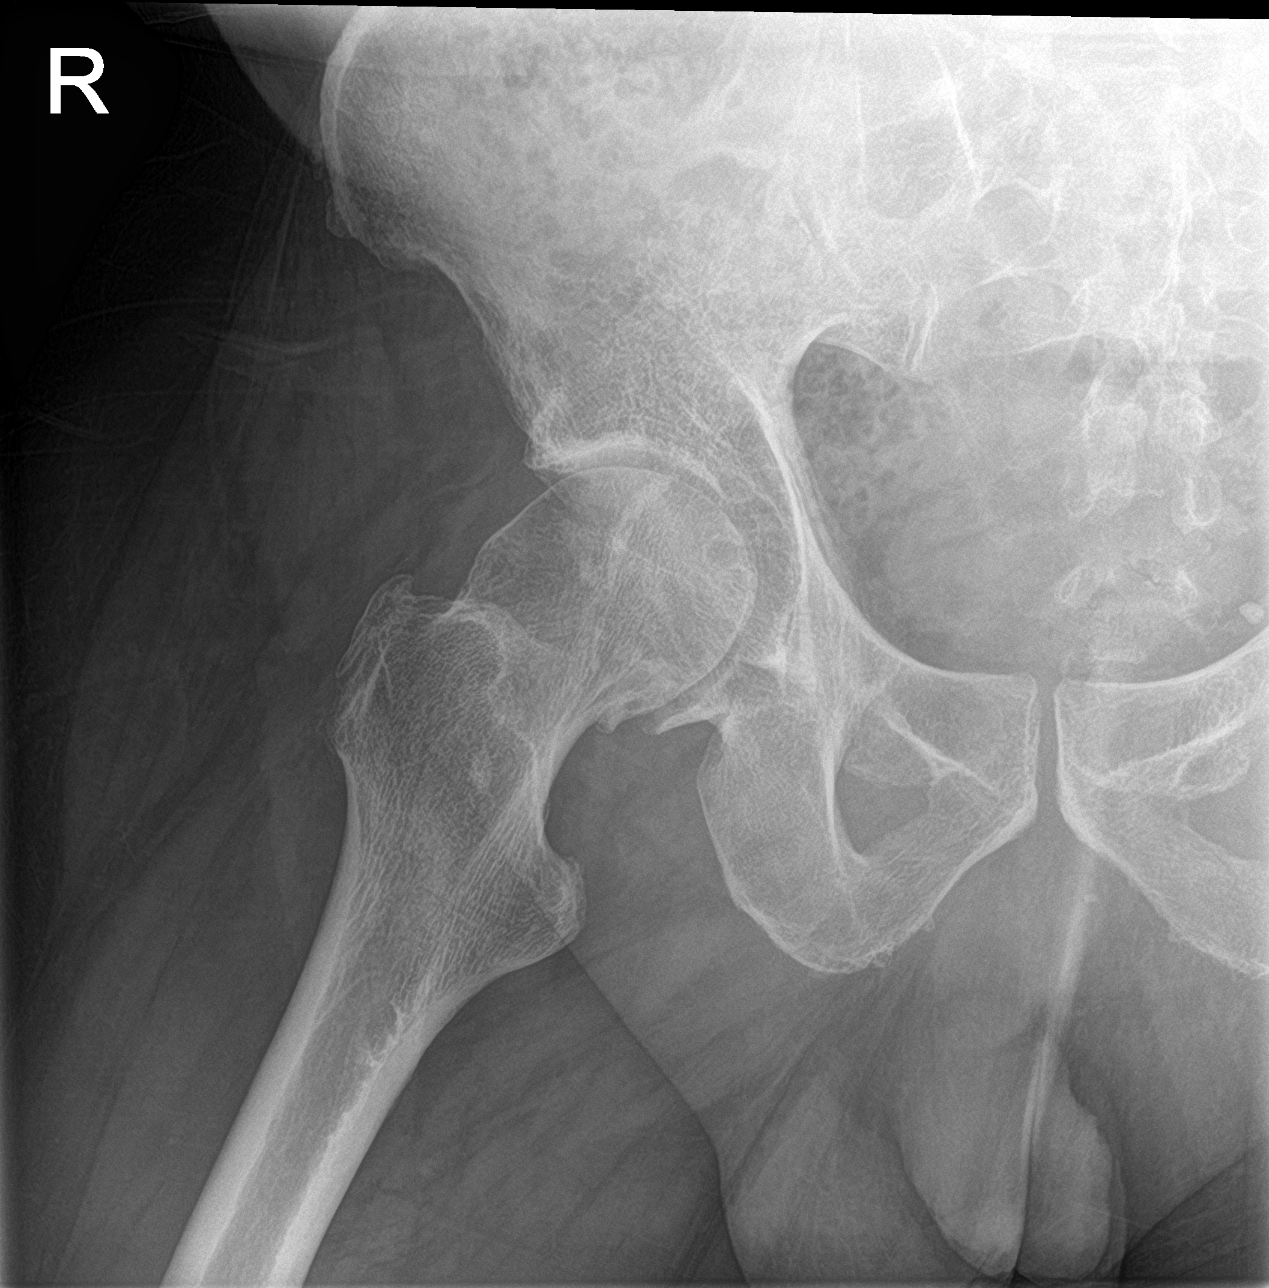

[3 of 3 positions shown; findings below may reference images not displayed]

FINDINGS: No fracture or dislocation. Degenerative changes are seen in both
hips without loss of joint space on the left. There appears to be
central loss of joint space on the right. No other abnormalities.
IMPRESSION: 1. Degenerative changes in both hips without loss of joint space on
the left. There is apparent loss of joint space centrally on the
right. No other abnormalities.

## 2023-09-15 NOTE — Care Plan (Signed)
Ortho Bundle Case Management Note  Patient Details  Name: Joshua [name] MRN: [id number] Date of Birth: 12-07-1945  patient in the office for shoulder injection to assist with pain control prior to surgery in December. Rev TSR planned for 10/09/23. will discharge to home with family. OPPT set up with [facility]. discharge instructions given. Patient and MD in agreement. Choice offered                     DME Arranged:    DME Agency:     HH Arranged:    HH Agency:     Additional Comments: Please contact me with any questions of if this plan should need to change.  Shauna Hugh,  RN,BSN,MHA,CCM  [facility] Orthopaedic Specialist  [phone] 09/15/2023, 3:42 PM

## 2023-09-17 DIAGNOSIS — R55 Syncope and collapse: Secondary | ICD-10-CM | POA: Diagnosis not present

## 2023-09-17 DIAGNOSIS — R001 Bradycardia, unspecified: Secondary | ICD-10-CM | POA: Diagnosis not present

## 2023-09-17 DIAGNOSIS — I495 Sick sinus syndrome: Secondary | ICD-10-CM | POA: Diagnosis not present

## 2023-09-17 DIAGNOSIS — I471 Supraventricular tachycardia, unspecified: Secondary | ICD-10-CM | POA: Diagnosis not present

## 2023-09-17 DIAGNOSIS — I480 Paroxysmal atrial fibrillation: Secondary | ICD-10-CM | POA: Diagnosis not present

## 2023-09-17 DIAGNOSIS — Z95818 Presence of other cardiac implants and grafts: Secondary | ICD-10-CM | POA: Diagnosis not present

## 2023-09-26 ENCOUNTER — Ambulatory Visit: Payer: Medicare HMO | Admitting: Pulmonary Disease

## 2023-09-26 DIAGNOSIS — S065XAA Traumatic subdural hemorrhage with loss of consciousness status unknown, initial encounter: Secondary | ICD-10-CM | POA: Diagnosis not present

## 2023-09-26 DIAGNOSIS — R296 Repeated falls: Secondary | ICD-10-CM | POA: Diagnosis not present

## 2023-09-26 DIAGNOSIS — R55 Syncope and collapse: Secondary | ICD-10-CM | POA: Diagnosis not present

## 2023-09-26 DIAGNOSIS — R42 Dizziness and giddiness: Secondary | ICD-10-CM | POA: Diagnosis not present

## 2023-09-30 NOTE — Telephone Encounter (Signed)
Pt was scheduled for visit with Mann[facility] for risk assessment on 09/26/23 and he cancelled the appt  Cancellation notes indicate he will call back to reschedule.

## 2023-09-30 NOTE — H&P (Signed)
PREOPERATIVE H&P  Chief Complaint: LEFT SHOULDER OA  HPI: Joshua [name] is a 77 y.o. male who is scheduled for, Procedure(s): REVERSE SHOULDER ARTHROPLASTY.   Patient has a past medical history significant for CAD, AF, RBBB, OSA, etOH abuse on naltrexone, Bipolar disorder 1, COPD and left cuff arthropathy   Symptoms are rated as moderate to severe, and have been worsening.  This is significantly impairing activities of daily living.    Please see clinic note for further details on this patient's care.    He has elected for surgical management.   Cardiology provided surgical clearance 07/15/2023  Past Medical History:  Diagnosis Date   Abnormal findings on diagnostic imaging of lung 09/23/2019   09/22/2019-lung cancer screening-centrilobular and paraseptal emphysema, calcified granulomata noted bilaterally, no suspicious nodules, lung RADS 1, repeat in 12 months    Anxiety    Atrial fibrillation (HCC) 09/23/2019   Centrilobular emphysema (HCC) 09/23/2019   09/22/2019-lung cancer screening-centrilobular and paraseptal emphysema, calcified granulomata noted bilaterally, no suspicious nodules, lung RADS 1, repeat in 12 months  09/23/2019-FVC 3.74 (81% predicted), postbronchodilator ratio 80, postbronchodilator FEV1 2.88 (86% predicted), no bronchodilator response, DLCO 19.44 (73% predicted)   DDD (degenerative disc disease), cervical 05/13/2008   Cervical Disc Degeneration   Depression    History of adenomatous polyp of colon 03/03/2018   05/14/17: Colonoscopy: nonadvanced adenoma, f/u 5 yrs, use SuPrep, [facility]/GAP  Last Assessment & Plan:  Relevant Hx: Course: Daily Update: Today's Plan:   Hyperlipidemia    Kidney stone    Nephrolithiasis 05/22/2011   Nontraumatic incomplete tear of right rotator cuff 10/14/2018   Other and unspecified hyperlipidemia 11/20/2009   Hyperlipidemia   RBBB 06/09/2012   Secondary hypercoagulable state (HCC) 09/28/2019   Shortness of breath  09/23/2019   Past Surgical History:  Procedure Laterality Date   CARDIOVERSION N/A 10/15/2019   Procedure: CARDIOVERSION;  Surgeon: Jake Bathe, MD;  Location: [facility] ENDOSCOPY;  Service: Cardiovascular;  Laterality: N/A;   CARDIOVERSION N/A 11/29/2019   Procedure: CARDIOVERSION;  Surgeon: Lars Masson, MD;  Location: [facility] ENDOSCOPY;  Service: Cardiovascular;  Laterality: N/A;   HEMORRHOID SURGERY     TOTAL HIP ARTHROPLASTY Right 12/21/2021   Procedure: TOTAL HIP ARTHROPLASTY ANTERIOR APPROACH;  Surgeon: Jodi Geralds, MD;  Location: [facility] ORS;  Service: Orthopedics;  Laterality: Right;   VARICOSE VEIN SURGERY Right    Social History   Socioeconomic History   Marital status: Married    Spouse name: Not on file   Number of children: Not on file   Years of education: Not on file   Highest education level: Bachelor's degree (e.g., BA, AB, BS)  Occupational History   Not on file  Tobacco Use   Smoking status: Former    Current packs/day: 0.00    Average packs/day: 2.0 packs/day for 46.0 years (92.0 ttl pk-yrs)    Types: Cigarettes    Start date: 27    Quit date: 2010    Years since quitting: 14.9   Smokeless tobacco: Never   Tobacco comments:    Former smoker 05/02/22  Vaping Use   Vaping status: Never Used  Substance and Sexual Activity   Alcohol use: Not Currently    Alcohol/week: 14.0 standard drinks of alcohol    Types: 14 Cans of beer per week    Comment: stop drinking 44 days ago 05/02/22   Drug use: No   Sexual activity: Yes    Partners: Female  Other Topics Concern  Not on file  Social History Narrative   Not on file   Social Determinants of Health   Financial Resource Strain: Low Risk  (06/07/2023)   Overall Financial Resource Strain (CARDIA)    Difficulty of Paying Living Expenses: Not hard at all  Food Insecurity: No Food Insecurity (06/09/2023)   Hunger Vital Sign    Worried About Running Out of Food in the Last Year: Never true    Ran Out of Food in the  Last Year: Never true  Transportation Needs: No Transportation Needs (06/09/2023)   PRAPARE - Administrator, Civil Service (Medical): No    Lack of Transportation (Non-Medical): No  Physical Activity: Unknown (06/07/2023)   Exercise Vital Sign    Days of Exercise per Week: 0 days    Minutes of Exercise per Session: Not on file  Stress: Stress Concern Present (06/07/2023)   [employer] of Occupational Health - Occupational Stress Questionnaire    Feeling of Stress : To some extent  Social Connections: Socially Isolated (06/07/2023)   Social Connection and Isolation Panel [NHANES]    Frequency of Communication with Friends and Family: Once a week    Frequency of Social Gatherings with Friends and Family: Never    Attends Religious Services: Never    Diplomatic Services operational officer: No    Attends Engineer, structural: Not on file    Marital Status: Married   Family History  Problem Relation Age of Onset   Varicose Veins Mother    Stroke Father    Allergies  Allergen Reactions   Venlafaxine Palpitations   Bupropion Other (See Comments)    unknown   Tramadol Other (See Comments)    Muscle spasm   Zoloft [Sertraline Hcl] Other (See Comments)    Tick   Prior to Admission medications   Medication Sig Start Date End Date Taking? Authorizing Provider  acetaminophen (TYLENOL) 650 MG CR tablet Take by mouth. 01/02/22   [provider]  apixaban (ELIQUIS) 5 MG TABS tablet Take 5 mg by mouth 2 (two) times daily.    [provider]  atorvastatin (LIPITOR) 40 MG tablet TAKE 1 TABLET BY MOUTH DAILY 08/26/23   Tobb, Kardie, DO  ciclopirox (LOPROX) 0.77 % cream Apply topically. 05/16/23   [provider]  clobetasol (TEMOVATE) 0.05 % external solution Apply 1 application topically once a week. Use in hair 03/09/20   [provider]  Docusate Sodium (COLACE PO) Take 1 tablet by mouth in the morning and at bedtime.    [provider]  fluorouracil (EFUDEX) 5 % cream Apply 1 application topically daily as needed (On head over night). 07/10/21   [provider]  FLUoxetine (PROZAC) 40 MG capsule Take 40 mg by mouth daily.    [provider]  furosemide (LASIX) 20 MG tablet Take 1 tablet every day by oral route.    [provider]  ketoconazole (NIZORAL) 2 % shampoo Apply 1 application topically 2 (two) times a week. Monday and Thursday 10/23/21   [provider]  lamoTRIgine (LAMICTAL) 200 MG tablet Take 100 mg by mouth 2 (two) times daily. 09/06/21   [provider]  Na Sulfate-K Sulfate-Mg Sulf 17.5-3.13-1.6 GM/177ML SOLN Take by mouth. 05/07/23   [provider]  naltrexone (DEPADE) 50 MG tablet Take 50 mg by mouth every morning. 04/09/22   [provider]  pregabalin (LYRICA) 75 MG capsule Take 1 capsule (75 mg total) by mouth 2 (  two) times daily. 05/28/23   Rodolph Bong, MD  tamsulosin (FLOMAX) 0.4 MG CAPS capsule Take 0.4 mg by mouth every other day.    [provider]    ROS: All other systems have been reviewed and were otherwise negative with the exception of those mentioned in the HPI and as above.  Physical Exam: General: Alert, no acute distress Cardiovascular: No pedal edema Respiratory: No cyanosis, no use of accessory musculature GI: No organomegaly, abdomen is soft and non-tender Skin: No lesions in the area of chief complaint Neurologic: Sensation intact distally Psychiatric: Patient is competent for consent with normal mood and affect Lymphatic: No axillary or cervical lymphadenopathy  MUSCULOSKELETAL:  Range of motion of the shoulder is to 150 degrees.  External rotation to 45.  Cuff strength is grossly weak and painful throughout.    Imaging: MRI reviewed demonstrates a complete tear of the supraspinatus with significant tendinosis and retraction.  There is unhealthy appearing supraspinatus tendon and some early signs of  superior articulation of the humeral head in the acromion.    Assessment: LEFT SHOULDER OA  Plan: Plan for Procedure(s): REVERSE SHOULDER ARTHROPLASTY   The risks benefits and alternatives were discussed with the patient including but not limited to the risks of nonoperative treatment, versus surgical intervention including infection, bleeding, nerve injury,  blood clots, cardiopulmonary complications, morbidity, mortality, among others, and they were willing to proceed.   The patient acknowledged the explanation, agreed to proceed with the plan and consent was signed.   Operative Plan: Left reverse total shoulder arthroplasty  Discharge Medications: Oxycodone, tylenol, compazine (2/2 h/o prolonged QT) DVT Prophylaxis: Resume home Eliquis  Physical Therapy: outpatient  Special Discharge needs: iceman, sling      Corinna Capra, PA-C  09/30/2023 3:56 PM

## 2023-10-01 ENCOUNTER — Telehealth: Payer: Self-pay

## 2023-10-01 ENCOUNTER — Encounter (HOSPITAL_BASED_OUTPATIENT_CLINIC_OR_DEPARTMENT_OTHER): Payer: Self-pay | Admitting: Orthopaedic Surgery

## 2023-10-01 ENCOUNTER — Other Ambulatory Visit: Payer: Self-pay

## 2023-10-01 DIAGNOSIS — F1010 Alcohol abuse, uncomplicated: Secondary | ICD-10-CM

## 2023-10-01 HISTORY — DX: Alcohol abuse, uncomplicated: F10.10

## 2023-10-01 NOTE — Telephone Encounter (Signed)
Copied from CRM [id number]. Topic: Clinical - Medical Advice >> Oct 01, 2023  9:15 AM Dollene Primrose wrote: Reason for CRM: [facility] [id number] Patient is scheduled for TSR on 12/5, needs medical surgery clearance. Clearance done for cardiac as of 11/26.

## 2023-10-01 NOTE — Progress Notes (Signed)
Patient's chart and all clearances reviewed with Dr Aleene Davidson, OK to be done at [facility].

## 2023-10-06 ENCOUNTER — Ambulatory Visit: Payer: Medicare HMO | Admitting: Pulmonary Disease

## 2023-10-06 ENCOUNTER — Encounter (HOSPITAL_BASED_OUTPATIENT_CLINIC_OR_DEPARTMENT_OTHER)
Admission: RE | Admit: 2023-10-06 | Discharge: 2023-10-06 | Disposition: A | Discharge status: Home / Self Care | Payer: Medicare HMO | Source: Ambulatory Visit | Attending: Orthopaedic Surgery | Admitting: Orthopaedic Surgery

## 2023-10-06 ENCOUNTER — Encounter: Payer: Self-pay | Admitting: Pulmonary Disease

## 2023-10-06 VITALS — BP 116/84 | HR 82 | Ht 71.0 in | Wt 218.4 lb

## 2023-10-06 DIAGNOSIS — I480 Paroxysmal atrial fibrillation: Secondary | ICD-10-CM | POA: Diagnosis not present

## 2023-10-06 DIAGNOSIS — Z01811 Encounter for preprocedural respiratory examination: Secondary | ICD-10-CM | POA: Diagnosis not present

## 2023-10-06 DIAGNOSIS — R0602 Shortness of breath: Secondary | ICD-10-CM | POA: Diagnosis not present

## 2023-10-06 DIAGNOSIS — Z01818 Encounter for other preprocedural examination: Secondary | ICD-10-CM | POA: Insufficient documentation

## 2023-10-06 DIAGNOSIS — G4733 Obstructive sleep apnea (adult) (pediatric): Secondary | ICD-10-CM | POA: Diagnosis not present

## 2023-10-06 LAB — SURGICAL PCR SCREEN
MRSA, PCR: NEGATIVE
Staphylococcus aureus: NEGATIVE

## 2023-10-06 NOTE — Patient Instructions (Signed)
VISIT SUMMARY:  You were seen today for a pre-surgery evaluation for your upcoming left shoulder replacement. We reviewed your history of sleep apnea, interstitial lung disease, and depression. You have been cleared for surgery, and a note will be sent to Dr. Austin Miles office. No changes were made to your current management for sleep apnea, interstitial lung disease, or depression.  YOUR PLAN:  -PREOPERATIVE EVALUATION FOR LEFT SHOULDER REPLACEMENT: You are scheduled for a left shoulder replacement surgery. You have been cleared for surgery, and a note will be sent to Dr. Austin Miles office to confirm this.  -SLEEP APNEA: Sleep apnea is a condition where breathing repeatedly stops and starts during sleep. You were previously diagnosed with moderate sleep apnea but are not currently using a CPAP machine. Since you report no current symptoms, no changes to your management are needed at this time.  -INTERSTITIAL LUNG DISEASE: Interstitial lung disease refers to a group of disorders that cause scarring or inflammation of the lungs. Your previous CT scan showed mild interstitial changes, but you have no current respiratory symptoms. Therefore, no changes to your management are needed at this time.   INSTRUCTIONS:  No follow-up appointment is needed unless new issues arise.

## 2023-10-06 NOTE — Progress Notes (Signed)
Joshua [name]    [id number]    1946/05/22  Primary Care Physician:Matthews, Selena Batten, DO  Referring Physician: Everrett Coombe, DO [facility] [address] [place],  [place] [address]  Chief complaint:  Pre op eval for shoulder surgery  HPI: 77 y.o. who  has a past medical history of Abnormal findings on diagnostic imaging of lung (09/23/2019), Anxiety, Atrial fibrillation (HCC) (09/23/2019), Centrilobular emphysema (HCC) (09/23/2019), DDD (degenerative disc disease), cervical (05/13/2008), Depression, ETOH abuse (10/01/2023), History of adenomatous polyp of colon (03/03/2018), History of kidney stones, Hyperlipidemia, Kidney stone, Nephrolithiasis (05/22/2011), Nontraumatic incomplete tear of right rotator cuff (10/14/2018), Other and unspecified hyperlipidemia (11/20/2009), RBBB (06/09/2012), Secondary hypercoagulable state (HCC) (09/28/2019), and Shortness of breath (09/23/2019).   Discussed the use of AI scribe software for clinical note transcription with the patient, who gave verbal consent to proceed.  The patient, with a history of depression and sleep apnea, presents for a pre-surgery evaluation for a left shoulder replacement scheduled for Thursday. The surgery will be performed by Dr. Everardo Pacific. The patient's sleep apnea was diagnosed several years ago, and he was recommended a CPAP machine and an inhaler. However, he reports that his snoring has decreased and he no longer wakes up due to it. He also mentions that he has a heart rate monitor implanted under his skin that sends information to his doctor. He has experienced shortness of breath in the past, but it quickly resolves and he believes it is related to his heart. He also mentions having had a CT scan in October 2023, which showed some interstitial markings, possibly indicating mild scarring or slight inflammation in the lungs.   Outpatient Encounter Medications as of 10/06/2023  Medication Sig    acetaminophen (TYLENOL) 650 MG CR tablet Take by mouth.   apixaban (ELIQUIS) 5 MG TABS tablet Take 5 mg by mouth 2 (two) times daily.   atorvastatin (LIPITOR) 40 MG tablet TAKE 1 TABLET BY MOUTH DAILY   ciclopirox (LOPROX) 0.77 % cream Apply topically.   clobetasol (TEMOVATE) 0.05 % external solution Apply 1 application topically once a week. Use in hair   Docusate Sodium (COLACE PO) Take 1 tablet by mouth in the morning and at bedtime.   fluorouracil (EFUDEX) 5 % cream Apply 1 application topically daily as needed (On head over night).   FLUoxetine (PROZAC) 40 MG capsule Take 40 mg by mouth daily.   ketoconazole (NIZORAL) 2 % shampoo Apply 1 application topically 2 (two) times a week. Monday and Thursday   lamoTRIgine (LAMICTAL) 200 MG tablet Take 100 mg by mouth 2 (two) times daily.   naltrexone (DEPADE) 50 MG tablet Take 50 mg by mouth every morning.   pregabalin (LYRICA) 75 MG capsule Take 1 capsule (75 mg total) by mouth 2 (two) times daily.   tamsulosin (FLOMAX) 0.4 MG CAPS capsule Take 0.4 mg by mouth every other day.   No facility-administered encounter medications on file as of 10/06/2023.    Allergies as of 10/06/2023 - Review Complete 10/06/2023  Allergen Reaction Noted   Venlafaxine Palpitations 07/08/2013   Bupropion Other (See Comments) 05/14/2011   Tramadol Other (See Comments) 11/14/2021   Zoloft [sertraline hcl] Other (See Comments) 05/14/2011    Past Medical History:  Diagnosis Date   Abnormal findings on diagnostic imaging of lung 09/23/2019   09/22/2019-lung cancer screening-centrilobular and paraseptal emphysema, calcified granulomata noted bilaterally, no suspicious nodules, lung RADS 1, repeat in 12 months  Anxiety    Atrial fibrillation (HCC) 09/23/2019   Centrilobular emphysema (HCC) 09/23/2019   09/22/2019-lung cancer screening-centrilobular and paraseptal emphysema, calcified granulomata noted bilaterally, no suspicious nodules, lung RADS 1, repeat in 12  months  09/23/2019-FVC 3.74 (81% predicted), postbronchodilator ratio 80, postbronchodilator FEV1 2.88 (86% predicted), no bronchodilator response, DLCO 19.44 (73% predicted)   DDD (degenerative disc disease), cervical 05/13/2008   Cervical Disc Degeneration   Depression    ETOH abuse 10/01/2023   continues to drink 1-3 drinks/ day   History of adenomatous polyp of colon 03/03/2018   05/14/17: Colonoscopy: nonadvanced adenoma, f/u 5 yrs, use SuPrep, Murphy/GAP  Last Assessment & Plan:  Relevant Hx: Course: Daily Update: Today's Plan:   History of kidney stones    Hyperlipidemia    Kidney stone    Nephrolithiasis 05/22/2011   Nontraumatic incomplete tear of right rotator cuff 10/14/2018   Other and unspecified hyperlipidemia 11/20/2009   Hyperlipidemia   RBBB 06/09/2012   Secondary hypercoagulable state (HCC) 09/28/2019   Shortness of breath 09/23/2019    Past Surgical History:  Procedure Laterality Date   CARDIOVERSION N/A 10/15/2019   Procedure: CARDIOVERSION;  Surgeon: Jake Bathe, MD;  Location: [facility] ENDOSCOPY;  Service: Cardiovascular;  Laterality: N/A;   CARDIOVERSION N/A 11/29/2019   Procedure: CARDIOVERSION;  Surgeon: Lars Masson, MD;  Location: [facility] ENDOSCOPY;  Service: Cardiovascular;  Laterality: N/A;   HEMORRHOID SURGERY     TOTAL HIP ARTHROPLASTY Right 12/21/2021   Procedure: TOTAL HIP ARTHROPLASTY ANTERIOR APPROACH;  Surgeon: Jodi Geralds, MD;  Location: [facility] ORS;  Service: Orthopedics;  Laterality: Right;   VARICOSE VEIN SURGERY Right     Family History  Problem Relation Age of Onset   Varicose Veins Mother    Stroke Father     Social History   Socioeconomic History   Marital status: Married    Spouse name: Not on file   Number of children: Not on file   Years of education: Not on file   Highest education level: Bachelor's degree (e.g., BA, AB, BS)  Occupational History   Not on file  Tobacco Use   Smoking status: Former    Current packs/day: 0.00     Average packs/day: 2.0 packs/day for 46.0 years (92.0 ttl pk-yrs)    Types: Cigarettes    Start date: 66    Quit date: 2010    Years since quitting: 14.9   Smokeless tobacco: Never   Tobacco comments:    Former smoker 05/02/22  Vaping Use   Vaping status: Never Used  Substance and Sexual Activity   Alcohol use: Yes    Alcohol/week: 14.0 standard drinks of alcohol    Types: 14 Cans of beer per week    Comment: has 1-3 beers or liquor drinks/day   Drug use: No   Sexual activity: Yes    Partners: Female  Other Topics Concern   Not on file  Social History Narrative   Not on file   Social Determinants of Health   Financial Resource Strain: Low Risk  (06/07/2023)   Overall Financial Resource Strain (CARDIA)    Difficulty of Paying Living Expenses: Not hard at all  Food Insecurity: No Food Insecurity (06/09/2023)   Hunger Vital Sign    Worried About Running Out of Food in the Last Year: Never true    Ran Out of Food in the Last Year: Never true  Transportation Needs: No Transportation Needs (06/09/2023)   PRAPARE - Transportation    Lack of Transportation (  Medical): No    Lack of Transportation (Non-Medical): No  Physical Activity: Unknown (06/07/2023)   Exercise Vital Sign    Days of Exercise per Week: 0 days    Minutes of Exercise per Session: Not on file  Stress: Stress Concern Present (06/07/2023)   [employer] of Occupational Health - Occupational Stress Questionnaire    Feeling of Stress : To some extent  Social Connections: Socially Isolated (06/07/2023)   Social Connection and Isolation Panel [NHANES]    Frequency of Communication with Friends and Family: Once a week    Frequency of Social Gatherings with Friends and Family: Never    Attends Religious Services: Never    Database administrator or Organizations: No    Attends Engineer, structural: Not on file    Marital Status: Married  Catering manager Violence: Not At Risk (06/04/2023)   Received from [facility]   HITS    Over the last 12 months how often did your partner physically hurt you?: Never    Over the last 12 months how often did your partner insult you or talk down to you?: Never    Over the last 12 months how often did your partner threaten you with physical harm?: Never    Over the last 12 months how often did your partner scream or curse at you?: Never    Review of systems: Review of Systems  Constitutional: Negative for fever and chills.  HENT: Negative.   Eyes: Negative for blurred vision.  Respiratory: as per HPI  Cardiovascular: Negative for chest pain and palpitations.  Gastrointestinal: Negative for vomiting, diarrhea, blood per rectum. Genitourinary: Negative for dysuria, urgency, frequency and hematuria.  Musculoskeletal: Negative for myalgias, back pain and joint pain.  Skin: Negative for itching and rash.  Neurological: Negative for dizziness, tremors, focal weakness, seizures and loss of consciousness.  Endo/Heme/Allergies: Negative for environmental allergies.  Psychiatric/Behavioral: Negative for depression, suicidal ideas and hallucinations.  All other systems reviewed and are negative.  Physical Exam: Blood pressure 116/84, pulse 82, height 5\' 11"  (1.803 m), weight 218 lb 6.4 oz (99.1 kg), SpO2 96%. Gen:      No acute distress HEENT:  EOMI, sclera anicteric Neck:     No masses; no thyromegaly Lungs:    Clear to auscultation bilaterally; normal respiratory effort CV:         Regular rate and rhythm; no murmurs Abd:      + bowel sounds; soft, non-tender; no palpable masses, no distension Ext:    No edema; adequate peripheral perfusion Skin:      Warm and dry; no rash Neuro: alert and oriented x 3 Psych: normal mood and affect  Data Reviewed: Imaging: CT chest 09/15/2022-subtle interstitial markings in the left upper lobe and left lower lobe.  Pleural thickening and calcifications. I have reviewed the images personally.  PFTs: 10/10/2022 FVC 3.89  (87%). FEV1 3.10 ( 96%). F/F 80, TLC 6.80 (93%). DLco 19.65 (75%) Normal test  Labs:  Assessment and Plan Preoperative Evaluation for Left Shoulder Replacement Patient with a history of moderate sleep apnea, not currently using CPAP, and mild interstitial changes on prior CT scan. No current respiratory symptoms.PFTs are normal  For Mr. Napoles, risk of perioperative pulmonary complications is increased by:  Age greater than 65 years  Obstructive sleep apnea  Respiratory complications generally occur in 1% of ASA Class I patients, 5% of ASA Class II and 10% of ASA Class [name]-IV patients These complications rarely result  in mortality and iclude postoperative pneumonia, atelectasis, pulmonary embolism, ARDS and increased time requiring postoperative mechanical ventilation.  Overall, I recommend proceeding with the surgery if the risk for respiratory complications are outweighed by the potential benefits. This will need to be discussed between the patient and surgeon.  To reduce risks of respiratory complications, I recommend: --Pre- and post-operative incentive spirometry performed frequently while awake --Inpatient use of currently prescribed positive-pressure for OSA whenever the patient is sleeping --Avoiding use of pancuronium during anesthesia. - Cleared for surgery. - Will send note to Dr. Austin Miles office.  I have discussed the risk factors and recommendations above with the patient.   Sleep Apnea Previously diagnosed with moderate sleep apnea, not currently using CPAP. Reports snoring has decreased and no current symptoms of sleep apnea. -No changes to current management.  Interstitial Lung Disease Mild interstitial changes noted on prior CT scan from September 03, 2022. No current respiratory symptoms. -No changes to current management.  No follow-up appointment needed unless new issues arise.    Chilton Greathouse MD [facility] 10/06/2023, 9:39 AM  CC:  Everrett Coombe, DO

## 2023-10-06 NOTE — Progress Notes (Signed)
Surgical soap given with instructions, pt verbalized understanding. Benzoyl peroxide gel given with instructions, pt verbalized understanding.  

## 2023-10-07 ENCOUNTER — Telehealth: Payer: Self-pay | Admitting: Pulmonary Disease

## 2023-10-07 NOTE — Telephone Encounter (Signed)
Note from 10/06/23 with risk assessment was faxed to [facility] - [phone] attn Dr Everardo Pacific.

## 2023-10-08 NOTE — Discharge Instructions (Signed)
Ramond Marrow MD, MPH Alfonse Alpers, PA-C [facility] [address], [address] [phone] (tel)   [phone] (fax)   POST-OPERATIVE INSTRUCTIONS - TOTAL SHOULDER REPLACEMENT    WOUND CARE You may leave the operative dressing in place until your follow-up appointment. KEEP THE INCISIONS CLEAN AND DRY. There may be a small amount of fluid/bleeding leaking at the surgical site. This is normal after surgery.  If it fills with liquid or blood please call us immediately to change it for you. Use the provided ice machine or Ice packs as often as possible for the first 3-4 days, then as needed for pain relief.   Keep a layer of cloth or a shirt between your skin and the cooling unit to prevent frost bite as it can get very cold.  SHOWERING: - You may shower on Post-Op Day #2.  - The dressing is water resistant but do not scrub it as it may start to peel up.   - You may remove the sling for showering - Gently pat the area dry.  - Do not soak the shoulder in water.  - Do not go swimming in the pool or ocean until your incision has completely healed (about 4-6 weeks after surgery) - KEEP THE INCISIONS CLEAN AND DRY.  EXERCISES Wear the sling at all times  You may remove the sling for showering, but keep the arm across the chest or in a secondary sling.    Accidental/Purposeful External Rotation and shoulder flexion (reaching behind you) is to be avoided at all costs for the first month. It is ok to come out of your sling if your are sitting and have assistance for eating.   Do not lift anything heavier than 1 pound until we discuss it further in clinic.  It is normal for your fingers/hand to become more swollen after surgery and discolored from bruising.   This will resolve over the first few weeks usually after surgery. Please continue to ambulate and do not stay sitting or lying for too long.  Perform foot and wrist pumps to assist in circulation.  PHYSICAL  THERAPY - You will begin physical therapy soon after surgery (unless otherwise specified) - Please call to set up an appointment, if you do not already have one  - Let our office if there are any issues with scheduling your therapy  - A PT referral was sent to [facility] [place]  REGIONAL ANESTHESIA (NERVE BLOCKS) The anesthesia team may have performed a nerve block for you this is a great tool used to minimize pain.   The block may start wearing off overnight (between 8-24 hours postop) When the block wears off, your pain may go from nearly zero to the pain you would have had postop without the block. This is an abrupt transition but nothing dangerous is happening.   This can be a challenging period but utilize your as needed pain medications to try and manage this period. We suggest you use the pain medication the first night prior to going to bed, to ease this transition.  You may take an extra dose of narcotic when this happens if needed   POST-OP MEDICATIONS- Multimodal approach to pain control In general your pain will be controlled with a combination of substances.  Prescriptions unless otherwise discussed are electronically sent to your pharmacy.  This is a carefully made plan we use to minimize narcotic use.     Meloxicam OR Celebrex - Anti-inflammatory medication taken on  a scheduled basis Acetaminophen - Non-narcotic pain medicine taken on a scheduled basis  Oxycodone - This is a strong narcotic, to be used only on an "as needed" basis for SEVERE pain. Xarelto (resume home blood thinner after surgery for prevention of blood clots) Omeprazole - daily medicine to protect your stomach while taking anti-inflammatories.   Zofran -  take as needed for nausea   FOLLOW-UP If you develop a Fever (>101.5), Redness or Drainage from the surgical incision site, please call our office to arrange for an evaluation. Please call the office to schedule a follow-up appointment for a  wound check, 7-10 days post-operatively.  IF YOU HAVE ANY QUESTIONS, PLEASE FEEL FREE TO CALL OUR OFFICE.  HELPFUL INFORMATION  Your arm will be in a sling following surgery. You will be in this sling for the next 4 weeks.   You may be more comfortable sleeping in a semi-seated position the first few nights following surgery.  Keep a pillow propped under the elbow and forearm for comfort.  If you have a recliner type of chair it might be beneficial.  If not that is fine too, but it would be helpful to sleep propped up with pillows behind your operated shoulder as well under your elbow and forearm.  This will reduce pulling on the suture lines.  When dressing, put your operative arm in the sleeve first.  When getting undressed, take your operative arm out last.  Loose fitting, button-down shirts are recommended.  In most states it is against the law to drive while your arm is in a sling. And certainly against the law to drive while taking narcotics.  You may return to work/school in the next couple of days when you feel up to it. Desk work and typing in the sling is fine.  We suggest you use the pain medication the first night prior to going to bed, in order to ease any pain when the anesthesia wears off. You should avoid taking pain medications on an empty stomach as it will make you nauseous.  You should wean off your narcotic medicines as soon as you are able.     Most patients will be off narcotics before their first postop appointment.   Do not drink alcoholic beverages or take illicit drugs when taking pain medications.  Pain medication may make you constipated.  Below are a few solutions to try in this order: Decrease the amount of pain medication if you aren't having pain. Drink lots of decaffeinated fluids. Drink prune juice and/or each dried prunes  If the first 3 don't work start with additional solutions Take Colace - an over-the-counter stool softener Take Senokot - an  over-the-counter laxative Take Miralax - a stronger over-the-counter laxative   Dental Antibiotics:  In most cases prophylactic antibiotics for Dental procdeures after total joint surgery are not necessary.  Exceptions are as follows:  1. History of prior total joint infection  2. Severely immunocompromised (Organ Transplant, cancer chemotherapy, Rheumatoid biologic meds such as Humira)  3. Poorly controlled diabetes (A1C &gt; 8.0, blood glucose over 200)  If you have one of these conditions, contact your surgeon for an antibiotic prescription, prior to your dental procedure.   For more information including helpful videos and documents visit our website:   [email or web address]

## 2023-10-08 NOTE — Anesthesia Preprocedure Evaluation (Addendum)
Anesthesia Evaluation  Patient identified by MRN, date of birth, ID band Patient awake    Reviewed: Allergy & Precautions, NPO status , Patient's Chart, lab work & pertinent test results  History of Anesthesia Complications Negative for: history of anesthetic complications  Airway Mallampati: III  TM Distance: >3 FB Neck ROM: Full    Dental  (+) Dental Advisory Given, Teeth Intact   Pulmonary neg sleep apnea, neg COPD, former smoker   breath sounds clear to auscultation       Cardiovascular hypertension, (-) angina + CAD and + Peripheral Vascular Disease  (-) Past MI + dysrhythmias Atrial Fibrillation  Rhythm:Regular  1. Left ventricular ejection fraction, by estimation, is 55 to 60%. The  left ventricle has normal function. The left ventricle has no regional  wall motion abnormalities. Left ventricular diastolic parameters are  consistent with Grade I diastolic  dysfunction (impaired relaxation).  2. Right ventricular systolic function is normal. The right ventricular  size is moderately enlarged.  3. Left atrial size was moderately dilated.  4. Right atrial size was mildly dilated.  5. The mitral valve is normal in structure. Trivial mitral valve  regurgitation. No evidence of mitral stenosis.  6. The aortic valve is normal in structure. Aortic valve regurgitation is  not visualized. Mild aortic valve sclerosis is present, with no evidence  of aortic valve stenosis.  7. Aneurysm of the ascending aorta, measuring 42 mm.  8. The inferior vena cava is normal in size with greater than 50%  respiratory variability, suggesting right atrial pressure of 3 mmHg.    Neuro/Psych neg Seizures PSYCHIATRIC DISORDERS Anxiety Depression Bipolar Disorder      GI/Hepatic negative GI ROS, Neg liver ROS,,,  Endo/Other  negative endocrine ROS    Renal/GU Renal disease     Musculoskeletal  (+) Arthritis ,    Abdominal   Peds   Hematology  (+) Blood dyscrasia, anemia   Anesthesia Other Findings   Reproductive/Obstetrics                             Anesthesia Physical Anesthesia Plan  ASA: 3  Anesthesia Plan: General   Post-op Pain Management: Regional block*, Tylenol PO (pre-op)* and Celebrex PO (pre-op)*   Induction: Intravenous  PONV Risk Score and Plan: 2 and Dexamethasone, Midazolam and Treatment may vary due to age or medical condition  Airway Management Planned: Oral ETT  Additional Equipment: None  Intra-op Plan:   Post-operative Plan: Extubation in OR  Informed Consent: I have reviewed the patients History and Physical, chart, labs and discussed the procedure including the risks, benefits and alternatives for the proposed anesthesia with the patient or authorized representative who has indicated his/her understanding and acceptance.     Dental advisory given  Plan Discussed with: CRNA and Anesthesiologist  Anesthesia Plan Comments: (See PAT note 12/14/2021, Jodell Cipro Ward, PA-C)        Anesthesia Quick Evaluation

## 2023-10-09 ENCOUNTER — Ambulatory Visit (HOSPITAL_BASED_OUTPATIENT_CLINIC_OR_DEPARTMENT_OTHER): Payer: Self-pay | Admitting: Anesthesiology

## 2023-10-09 ENCOUNTER — Encounter (HOSPITAL_BASED_OUTPATIENT_CLINIC_OR_DEPARTMENT_OTHER)
Admission: RE | Disposition: A | Discharge status: Home / Self Care | Payer: Self-pay | Source: Home / Self Care | Attending: Orthopaedic Surgery

## 2023-10-09 ENCOUNTER — Other Ambulatory Visit: Payer: Self-pay

## 2023-10-09 ENCOUNTER — Ambulatory Visit (HOSPITAL_BASED_OUTPATIENT_CLINIC_OR_DEPARTMENT_OTHER)
Admission: RE | Admit: 2023-10-09 | Discharge: 2023-10-09 | Disposition: A | Discharge status: Home / Self Care | Payer: Medicare HMO | Attending: Orthopaedic Surgery | Admitting: Orthopaedic Surgery

## 2023-10-09 ENCOUNTER — Ambulatory Visit (HOSPITAL_COMMUNITY): Payer: Medicare HMO

## 2023-10-09 ENCOUNTER — Encounter (HOSPITAL_BASED_OUTPATIENT_CLINIC_OR_DEPARTMENT_OTHER): Payer: Self-pay | Admitting: Orthopaedic Surgery

## 2023-10-09 DIAGNOSIS — Z87891 Personal history of nicotine dependence: Secondary | ICD-10-CM | POA: Insufficient documentation

## 2023-10-09 DIAGNOSIS — I2510 Atherosclerotic heart disease of native coronary artery without angina pectoris: Secondary | ICD-10-CM | POA: Insufficient documentation

## 2023-10-09 DIAGNOSIS — F1010 Alcohol abuse, uncomplicated: Secondary | ICD-10-CM | POA: Insufficient documentation

## 2023-10-09 DIAGNOSIS — Z471 Aftercare following joint replacement surgery: Secondary | ICD-10-CM | POA: Diagnosis not present

## 2023-10-09 DIAGNOSIS — G8918 Other acute postprocedural pain: Secondary | ICD-10-CM | POA: Diagnosis not present

## 2023-10-09 DIAGNOSIS — F319 Bipolar disorder, unspecified: Secondary | ICD-10-CM | POA: Diagnosis not present

## 2023-10-09 DIAGNOSIS — Z01818 Encounter for other preprocedural examination: Secondary | ICD-10-CM

## 2023-10-09 DIAGNOSIS — I4891 Unspecified atrial fibrillation: Secondary | ICD-10-CM | POA: Diagnosis not present

## 2023-10-09 DIAGNOSIS — I4510 Unspecified right bundle-branch block: Secondary | ICD-10-CM | POA: Insufficient documentation

## 2023-10-09 DIAGNOSIS — M19012 Primary osteoarthritis, left shoulder: Secondary | ICD-10-CM | POA: Diagnosis not present

## 2023-10-09 DIAGNOSIS — J432 Centrilobular emphysema: Secondary | ICD-10-CM | POA: Diagnosis not present

## 2023-10-09 DIAGNOSIS — I129 Hypertensive chronic kidney disease with stage 1 through stage 4 chronic kidney disease, or unspecified chronic kidney disease: Secondary | ICD-10-CM | POA: Diagnosis not present

## 2023-10-09 DIAGNOSIS — Z96612 Presence of left artificial shoulder joint: Secondary | ICD-10-CM | POA: Diagnosis not present

## 2023-10-09 DIAGNOSIS — I480 Paroxysmal atrial fibrillation: Secondary | ICD-10-CM

## 2023-10-09 DIAGNOSIS — G4733 Obstructive sleep apnea (adult) (pediatric): Secondary | ICD-10-CM | POA: Insufficient documentation

## 2023-10-09 DIAGNOSIS — M75122 Complete rotator cuff tear or rupture of left shoulder, not specified as traumatic: Secondary | ICD-10-CM | POA: Insufficient documentation

## 2023-10-09 DIAGNOSIS — N183 Chronic kidney disease, stage 3 unspecified: Secondary | ICD-10-CM | POA: Diagnosis not present

## 2023-10-09 HISTORY — DX: Personal history of urinary calculi: Z87.442

## 2023-10-09 HISTORY — PX: REVERSE SHOULDER ARTHROPLASTY: SHX5054

## 2023-10-09 SURGERY — ARTHROPLASTY, SHOULDER, TOTAL, REVERSE
Anesthesia: General | Site: Shoulder | Laterality: Left

## 2023-10-09 MED ORDER — SUGAMMADEX SODIUM 200 MG/2ML IV SOLN
INTRAVENOUS | Status: DC | PRN
  Administered 2023-10-09: 200 mg via INTRAVENOUS

## 2023-10-09 MED ORDER — OXYCODONE HCL 5 MG PO TABS: 5.0000 mg | ORAL_TABLET | Freq: Once | ORAL | Status: DC | PRN

## 2023-10-09 MED ORDER — SODIUM CHLORIDE 0.9 % IV SOLN: INTRAVENOUS | Status: DC

## 2023-10-09 MED ORDER — ONDANSETRON HCL 4 MG/2ML IJ SOLN
INTRAMUSCULAR | Status: AC
Start: 2023-10-09 — End: ?
  Filled 2023-10-09: qty 2

## 2023-10-09 MED ORDER — CEFAZOLIN SODIUM-DEXTROSE 2-4 GM/100ML-% IV SOLN
INTRAVENOUS | Status: AC
  Filled 2023-10-09: qty 100

## 2023-10-09 MED ORDER — OXYCODONE HCL 5 MG PO TABS: ORAL_TABLET | ORAL | 0 refills | Status: AC

## 2023-10-09 MED ORDER — EPINEPHRINE PF 1 MG/ML IJ SOLN
INTRAMUSCULAR | Status: AC
  Filled 2023-10-09: qty 1

## 2023-10-09 MED ORDER — DEXAMETHASONE SODIUM PHOSPHATE 10 MG/ML IJ SOLN
INTRAMUSCULAR | Status: DC | PRN
  Administered 2023-10-09: 10 mg via INTRAVENOUS

## 2023-10-09 MED ORDER — 0.9 % SODIUM CHLORIDE (POUR BTL) OPTIME
TOPICAL | Status: DC | PRN
  Administered 2023-10-09: 1000 mL

## 2023-10-09 MED ORDER — PROPOFOL 10 MG/ML IV BOLUS
INTRAVENOUS | Status: DC | PRN
  Administered 2023-10-09: 130 mg via INTRAVENOUS

## 2023-10-09 MED ORDER — TRANEXAMIC ACID-NACL 1000-0.7 MG/100ML-% IV SOLN
INTRAVENOUS | Status: AC
  Filled 2023-10-09: qty 100

## 2023-10-09 MED ORDER — VANCOMYCIN HCL 1000 MG IV SOLR
INTRAVENOUS | Status: AC
  Filled 2023-10-09: qty 20

## 2023-10-09 MED ORDER — CEFAZOLIN SODIUM-DEXTROSE 2-4 GM/100ML-% IV SOLN: 2.0000 g | INTRAVENOUS | Status: DC

## 2023-10-09 MED ORDER — MEPERIDINE HCL 25 MG/ML IJ SOLN: 6.2500 mg | INTRAMUSCULAR | Status: DC | PRN

## 2023-10-09 MED ORDER — ACETAMINOPHEN 500 MG PO TABS
ORAL_TABLET | ORAL | Status: AC
  Filled 2023-10-09: qty 2

## 2023-10-09 MED ORDER — FENTANYL CITRATE (PF) 100 MCG/2ML IJ SOLN
INTRAMUSCULAR | Status: AC
  Filled 2023-10-09: qty 2

## 2023-10-09 MED ORDER — LIDOCAINE 2% (20 MG/ML) 5 ML SYRINGE
INTRAMUSCULAR | Status: DC | PRN
  Administered 2023-10-09: 100 mg via INTRAVENOUS

## 2023-10-09 MED ORDER — METHOCARBAMOL 750 MG PO TABS
750.0000 mg | ORAL_TABLET | Freq: Four times a day (QID) | ORAL | 0 refills | Status: AC
Start: 2023-10-09 — End: 2023-10-16

## 2023-10-09 MED ORDER — SODIUM CHLORIDE (PF) 0.9 % IJ SOLN
INTRAMUSCULAR | Status: DC | PRN
  Administered 2023-10-09: 1000 mL

## 2023-10-09 MED ORDER — CEFAZOLIN SODIUM-DEXTROSE 2-3 GM-%(50ML) IV SOLR
INTRAVENOUS | Status: DC | PRN
  Administered 2023-10-09: 2 g via INTRAVENOUS

## 2023-10-09 MED ORDER — ACETAMINOPHEN 500 MG PO TABS: 1000.0000 mg | ORAL_TABLET | Freq: Once | ORAL | Status: AC

## 2023-10-09 MED ORDER — BUPIVACAINE HCL (PF) 0.5 % IJ SOLN
INTRAMUSCULAR | Status: DC | PRN
  Administered 2023-10-09: 10 mL via PERINEURAL

## 2023-10-09 MED ORDER — BUPIVACAINE LIPOSOME 1.3 % IJ SUSP
INTRAMUSCULAR | Status: DC | PRN
  Administered 2023-10-09: 10 mL via PERINEURAL

## 2023-10-09 MED ORDER — FENTANYL CITRATE (PF) 100 MCG/2ML IJ SOLN
INTRAMUSCULAR | Status: DC | PRN
  Administered 2023-10-09: 50 ug via INTRAVENOUS

## 2023-10-09 MED ORDER — CELECOXIB 200 MG PO CAPS: 200.0000 mg | ORAL_CAPSULE | Freq: Once | ORAL | Status: DC

## 2023-10-09 MED ORDER — OXYCODONE HCL 5 MG/5ML PO SOLN: 5.0000 mg | Freq: Once | ORAL | Status: DC | PRN

## 2023-10-09 MED ORDER — GABAPENTIN 300 MG PO CAPS
ORAL_CAPSULE | ORAL | Status: AC
  Filled 2023-10-09: qty 1

## 2023-10-09 MED ORDER — MIDAZOLAM HCL 2 MG/2ML IJ SOLN
2.0000 mg | Freq: Once | INTRAMUSCULAR | Status: AC
  Administered 2023-10-09: 2 mg via INTRAVENOUS

## 2023-10-09 MED ORDER — FENTANYL CITRATE (PF) 100 MCG/2ML IJ SOLN
50.0000 ug | Freq: Once | INTRAMUSCULAR | Status: AC
  Administered 2023-10-09: 50 ug via INTRAVENOUS

## 2023-10-09 MED ORDER — ROCURONIUM BROMIDE 10 MG/ML (PF) SYRINGE
PREFILLED_SYRINGE | INTRAVENOUS | Status: DC | PRN
  Administered 2023-10-09: 60 mg via INTRAVENOUS

## 2023-10-09 MED ORDER — ACETAMINOPHEN 325 MG PO TABS: 325.0000 mg | ORAL_TABLET | ORAL | Status: DC | PRN

## 2023-10-09 MED ORDER — DEXAMETHASONE SODIUM PHOSPHATE 10 MG/ML IJ SOLN
INTRAMUSCULAR | Status: AC
  Filled 2023-10-09: qty 1

## 2023-10-09 MED ORDER — GABAPENTIN 300 MG PO CAPS
300.0000 mg | ORAL_CAPSULE | Freq: Once | ORAL | Status: AC
  Administered 2023-10-09: 300 mg via ORAL

## 2023-10-09 MED ORDER — TRANEXAMIC ACID-NACL 1000-0.7 MG/100ML-% IV SOLN
1000.0000 mg | INTRAVENOUS | Status: AC
  Administered 2023-10-09: 1000 mg via INTRAVENOUS

## 2023-10-09 MED ORDER — ACETAMINOPHEN 500 MG PO TABS
1000.0000 mg | ORAL_TABLET | Freq: Once | ORAL | Status: AC
  Administered 2023-10-09: 1000 mg via ORAL

## 2023-10-09 MED ORDER — MIDAZOLAM HCL 2 MG/2ML IJ SOLN
INTRAMUSCULAR | Status: AC
  Filled 2023-10-09: qty 2

## 2023-10-09 MED ORDER — ACETAMINOPHEN 160 MG/5ML PO SOLN: 325.0000 mg | ORAL | Status: DC | PRN

## 2023-10-09 MED ORDER — LACTATED RINGERS IV SOLN: INTRAVENOUS | Status: DC

## 2023-10-09 MED ORDER — CELECOXIB 200 MG PO CAPS
ORAL_CAPSULE | ORAL | Status: AC
  Filled 2023-10-09: qty 1

## 2023-10-09 MED ORDER — EPHEDRINE SULFATE-NACL 50-0.9 MG/10ML-% IV SOSY
PREFILLED_SYRINGE | INTRAVENOUS | Status: DC | PRN
  Administered 2023-10-09: 5 mg via INTRAVENOUS

## 2023-10-09 MED ORDER — PHENYLEPHRINE HCL-NACL 20-0.9 MG/250ML-% IV SOLN
INTRAVENOUS | Status: DC | PRN
  Administered 2023-10-09: 25 ug/min via INTRAVENOUS

## 2023-10-09 MED ORDER — ONDANSETRON HCL 4 MG/2ML IJ SOLN
INTRAMUSCULAR | Status: DC | PRN
  Administered 2023-10-09: 4 mg via INTRAVENOUS

## 2023-10-09 MED ORDER — FENTANYL CITRATE (PF) 100 MCG/2ML IJ SOLN: 25.0000 ug | INTRAMUSCULAR | Status: DC | PRN

## 2023-10-09 MED ORDER — PROCHLORPERAZINE MALEATE 5 MG PO TABS: 5.0000 mg | ORAL_TABLET | Freq: Four times a day (QID) | ORAL | 0 refills | Status: AC | PRN

## 2023-10-09 MED ORDER — VANCOMYCIN HCL 1000 MG IV SOLR
INTRAVENOUS | Status: DC | PRN
  Administered 2023-10-09: 1000 mg via TOPICAL

## 2023-10-09 SURGICAL SUPPLY — 62 items
BASEPLATE SHOULDER FW 15D 29 (Joint) IMPLANT
BIT DRILL 3.2 PERIPHERAL SCREW (BIT) IMPLANT
BLADE SAW SGTL 73X25 THK (BLADE) ×1 IMPLANT
BLADE SURG 10 STRL SS (BLADE) IMPLANT
BLADE SURG 15 STRL LF DISP TIS (BLADE) IMPLANT
BRUSH SCRUB EZ PLAIN DRY (MISCELLANEOUS) ×1 IMPLANT
CHLORAPREP W/TINT 26 (MISCELLANEOUS) ×1 IMPLANT
CLSR STERI-STRIP ANTIMIC 1/2X4 (GAUZE/BANDAGES/DRESSINGS) ×1 IMPLANT
COOLER ICEMAN CLASSIC (MISCELLANEOUS) ×1 IMPLANT
COVER BACK TABLE 60X90IN (DRAPES) ×1 IMPLANT
COVER MAYO STAND STRL (DRAPES) ×1 IMPLANT
CUP HUM INSERT SHLD 3/4 39 +0 (Cup) IMPLANT
DRAPE IMP U-DRAPE 54X76 (DRAPES) IMPLANT
DRAPE INCISE IOBAN 66X45 STRL (DRAPES) ×1 IMPLANT
DRAPE POUCH INSTRU U-SHP 10X18 (DRAPES) ×1 IMPLANT
DRAPE U-SHAPE 76X120 STRL (DRAPES) ×2 IMPLANT
DRSG AQUACEL AG ADV 3.5X 6 (GAUZE/BANDAGES/DRESSINGS) ×1 IMPLANT
ELECT BLADE 4.0 EZ CLEAN MEGAD (MISCELLANEOUS) ×1
ELECT REM PT RETURN 9FT ADLT (ELECTROSURGICAL) ×1
ELECTRODE BLDE 4.0 EZ CLN MEGD (MISCELLANEOUS) ×1 IMPLANT
ELECTRODE REM PT RTRN 9FT ADLT (ELECTROSURGICAL) ×1 IMPLANT
FACESHIELD WRAPAROUND (MASK) ×2
FACESHIELD WRAPAROUND OR TEAM (MASK) ×2 IMPLANT
GAUZE XEROFORM 1X8 LF (GAUZE/BANDAGES/DRESSINGS) IMPLANT
GLENOSPHERE STANDARD 39 (Joint) ×1 IMPLANT
GLENOSPHERE STD 39 (Joint) IMPLANT
GLOVE BIO SURGEON STRL SZ 6.5 (GLOVE) ×2 IMPLANT
GLOVE BIOGEL PI IND STRL 6.5 (GLOVE) ×1 IMPLANT
GLOVE BIOGEL PI IND STRL 8 (GLOVE) ×1 IMPLANT
GLOVE ECLIPSE 8.0 STRL XLNG CF (GLOVE) ×2 IMPLANT
GOWN STRL REUS W/ TWL LRG LVL3 (GOWN DISPOSABLE) ×2 IMPLANT
GOWN STRL REUS W/TWL XL LVL3 (GOWN DISPOSABLE) ×1 IMPLANT
GUIDE PIN 3X75 SHOULDER (PIN) ×1
GUIDEWIRE GLENOID 2.5X220 (WIRE) IMPLANT
KIT SHOULDER STAB MARCO (KITS) ×1 IMPLANT
MANIFOLD NEPTUNE II (INSTRUMENTS) ×1 IMPLANT
PACK BASIN DAY SURGERY FS (CUSTOM PROCEDURE TRAY) ×1 IMPLANT
PACK SHOULDER (CUSTOM PROCEDURE TRAY) ×1 IMPLANT
PAD COLD SHLDR WRAP-ON (PAD) ×1 IMPLANT
PIN GUIDE 3X75 SHOULDER (PIN) IMPLANT
RESTRAINT HEAD UNIVERSAL NS (MISCELLANEOUS) ×1 IMPLANT
SCREW 5.5X22 (Screw) IMPLANT
SCREW 5.5X26 (Screw) IMPLANT
SCREW BONE 6.5X40 SM (Screw) IMPLANT
SCREW PERIPHERAL 30 (Screw) IMPLANT
SET HNDPC FAN SPRY TIP SCT (DISPOSABLE) ×1 IMPLANT
SHEET MEDIUM DRAPE 40X70 STRL (DRAPES) ×1 IMPLANT
SLEEVE SCD COMPRESS KNEE MED (STOCKING) ×1 IMPLANT
SLING ARM FOAM STRAP LRG (SOFTGOODS) IMPLANT
SPIKE FLUID TRANSFER (MISCELLANEOUS) IMPLANT
SPONGE T-LAP 18X18 ~~LOC~~+RFID (SPONGE) ×1 IMPLANT
STEM HUM PERFORM 4 (Stem) IMPLANT
SUT ETHIBOND 2 V 37 (SUTURE) ×1 IMPLANT
SUT ETHIBOND NAB CT1 #1 30IN (SUTURE) ×1 IMPLANT
SUT ETHILON 3 0 PS 1 (SUTURE) IMPLANT
SUT FIBERWIRE #5 38 CONV NDL (SUTURE) ×2
SUT MNCRL AB 4-0 PS2 18 (SUTURE) ×1 IMPLANT
SUT VIC AB 0 CT1 27XBRD ANBCTR (SUTURE) IMPLANT
SUT VIC AB 3-0 SH 27X BRD (SUTURE) ×1 IMPLANT
SUTURE FIBERWR #5 38 CONV NDL (SUTURE) ×2 IMPLANT
TOWEL GREEN STERILE FF (TOWEL DISPOSABLE) ×3 IMPLANT
TUBE SUCTION HIGH CAP CLEAR NV (SUCTIONS) ×1 IMPLANT

## 2023-10-09 NOTE — Transfer of Care (Signed)
Immediate Anesthesia Transfer of Care Note  Patient: Joshua [name]  Procedure(s) Performed: REVERSE SHOULDER ARTHROPLASTY (Left: Shoulder)  Patient Location: PACU  Anesthesia Type:General  Level of Consciousness: awake, alert , and oriented  Airway & Oxygen Therapy: Patient Spontanous Breathing and Patient connected to face mask oxygen  Post-op Assessment: Report given to RN and Post -op Vital signs reviewed and stable  Post vital signs: Reviewed and stable  Last Vitals:  Vitals Value Taken Time  BP 136/85 10/09/23 0851  Temp 37.1 C 10/09/23 0851  Pulse 79 10/09/23 0851  Resp 16 10/09/23 0851  SpO2 95 % 10/09/23 0851    Last Pain:  Vitals:   10/09/23 0851  TempSrc:   PainSc: 0-No pain      Patients Stated Pain Goal: 3 (10/09/23 0981)  Complications: No notable events documented.

## 2023-10-09 NOTE — Anesthesia Procedure Notes (Addendum)
Procedure Name: Intubation Date/Time: 10/09/2023 7:41 AM  Performed by: Alvera Novel, CRNAPre-anesthesia Checklist: Patient identified, Emergency Drugs available, Suction available and Patient being monitored Patient Re-evaluated:Patient Re-evaluated prior to induction Oxygen Delivery Method: Circle System Utilized Preoxygenation: Pre-oxygenation with 100% oxygen Induction Type: IV induction Ventilation: Mask ventilation without difficulty Laryngoscope Size: Glidescope and 4 Grade View: Grade I Tube type: Oral Tube size: 7.0 mm Number of attempts: 1 Airway Equipment and Method: Stylet and Oral airway Placement Confirmation: ETT inserted through vocal cords under direct vision, positive ETCO2 and breath sounds checked- equal and bilateral Secured at: 23 cm Tube secured with: Tape Dental Injury: Teeth and Oropharynx as per pre-operative assessment  Difficulty Due To: Difficulty was anticipated, Difficult Airway- due to anterior larynx, Difficult Airway-  due to neck instability and Difficult Airway- due to reduced neck mobility Comments: Pt has reduced neck mobility and known cervical disc disease. Elective glidescope go intubation. Also has short chin. Grade 1 view and easily passed 7.0 ETT. Would recommend using 7.5 ETT in the future.

## 2023-10-09 NOTE — Anesthesia Postprocedure Evaluation (Signed)
Anesthesia Post Note  Patient: Joshua [name]  Procedure(s) Performed: REVERSE SHOULDER ARTHROPLASTY (Left: Shoulder)     Patient location during evaluation: PACU Anesthesia Type: General Level of consciousness: awake and alert Pain management: pain level controlled Vital Signs Assessment: post-procedure vital signs reviewed and stable Respiratory status: spontaneous breathing, nonlabored ventilation, respiratory function stable and patient connected to nasal cannula oxygen Cardiovascular status: blood pressure returned to baseline and stable Postop Assessment: no apparent nausea or vomiting Anesthetic complications: yes   Encounter Notable Events  Notable Event Outcome Phase Comment  Difficult to intubate - expected  <72 hours postprocedure Filed from anesthesia note documentation.    Last Vitals:  Vitals:   10/09/23 0915 10/09/23 0936  BP: 136/89 (!) 128/92  Pulse: 64 68  Resp: 17 17  Temp:  36.7 C  SpO2: 97% 94%    Last Pain:  Vitals:   10/09/23 0936  TempSrc:   PainSc: 0-No pain                 Ellis Mehaffey

## 2023-10-09 NOTE — Progress Notes (Signed)
Assisted Dr. Oddono with left, interscalene , ultrasound guided block. Side rails up, monitors on throughout procedure. See vital signs in flow sheet. Tolerated Procedure well. 

## 2023-10-09 NOTE — Op Note (Signed)
Orthopaedic Surgery Operative Note (CSN: [id number])  Joshua [name]  [birth year] Date of Surgery: 10/09/2023   Diagnoses:  Left cuff tear arthropathy  Procedure: Reverse total Shoulder Arthroplasty   Operative Finding Successful completion of planned procedure.  Patient had a complete superior cuff tear that was atypical and clearly would not of unrepairable.  Axillary nerve tug test was normal.  Post-operative plan: The patient will be NWB in sling.  The patient will be will be discharged from PACU if continues to be stable as was plan prior to surgery.  DVT prophylaxis not indicated as patient already on full dose anticoagulant.  Pain control with PRN pain medication preferring oral medicines.  Follow up plan will be scheduled in approximately 7 days for incision check and XR.  Physical therapy to start immediately.  Implants: Tornier perform humeral size 4 stem, 0 retentive polyethylene, 39 standard glenosphere with a 29 full wedge baseplate and a 40 center screw with 4 peripheral screws  Post-Op Diagnosis: Same Surgeons:Primary: Bjorn Pippin, MD Assistants:Deb Lizbeth Bark Location: [facility] OR ROOM 1 Anesthesia: General with Exparel Interscalene Antibiotics: Ancef 2g preop, Vancomycin 1000mg  locally Tourniquet time: None Estimated Blood Loss: 100 Complications: None Specimens: None Implants: Implant Name Type Inv. Item Serial No. Manufacturer Lot No. LRB No. Used Action  BASEPLATE SHOULDER FW 15D 29 - [id number] Joint BASEPLATE SHOULDER FW 15D 29 [id number] [employer]  Left 1 Implanted  GLENOSPHERE STANDARD 39 - [id number] Joint GLENOSPHERE STANDARD 39 [id number] [employer]  Left 1 Implanted  SCREW BONE 6.5X40 SM - [id number] Screw SCREW BONE 6.5X40 SM  [employer] ON STERILE TRAY Left 1 Implanted  SCREW PERIPHERAL 30 - [id number] Screw SCREW PERIPHERAL 30  [employer] ON STERILE TRAY Left 1 Implanted  SCREW 5.5X26 - [id number] Screw SCREW 5.5X26  [employer] ON STERILE  TRAY Left 2 Implanted  SCREW 5.5X22 - [id number] Screw SCREW 5.5X22  [employer] ON STERILE TRAY Left 1 Implanted  STEM HUM PERFORM 4 - [id number] Stem STEM HUM PERFORM 4 [id number] [employer]  Left 1 Implanted  CUP HUM INSERT SHLD 3/4 39 +0 - [id number] Cup CUP HUM INSERT SHLD 3/4 39 +0 [id number] [employer]  Left 1 Implanted    Indications for Surgery:   Joshua [name] is a 77 y.o. male with cuff tear arthropathy.  Benefits and risks of operative and nonoperative management were discussed prior to surgery with patient/guardian(s) and informed consent form was completed.  Infection and need for further surgery were discussed as was prosthetic stability and cuff issues.  We additionally specifically discussed risks of axillary nerve injury, infection, periprosthetic fracture, continued pain and longevity of implants prior to beginning procedure.      Procedure:   The patient was identified in the preoperative holding area where the surgical site was marked. Block placed by anesthesia with exparel.  The patient was taken to the OR where a procedural timeout was called and the above noted anesthesia was induced.  The patient was positioned beachchair on allen table with spider arm positioner.  Preoperative antibiotics were dosed.  The patient's left shoulder was prepped and draped in the usual sterile fashion.  A second preoperative timeout was called.      Standard deltopectoral approach was performed with a #10 blade. We dissected down to the subcutaneous tissues and the cephalic vein was taken laterally with the deltoid. Clavipectoral fascia was incised in line with the incision. Deep retractors were placed. The long of the biceps  tendon was identified and there was significant tenosynovitis present.  Tenodesis was performed to the pectoralis tendon with #2 Ethibond. The remaining biceps was followed up into the rotator interval where it was released.   The subscapularis was taken down in a  full thickness layer with capsule along the humeral neck extending inferiorly around the humeral head. We continued releasing the capsule directly off of the osteophytes inferiorly all the way around the corner. This allowed [place] to dislocate the humeral head.    The rotator cuff was carefully examined and noted to be irreperably torn.  The decision was confirmed that a reverse total shoulder was indicated for this patient.  There were osteophytes along the inferior humeral neck. The osteophytes were removed with an osteotome and a rongeur.  Osteophytes were removed with a rongeur and an osteotome and the anatomic neck was well visualized.     A humeral cutting guide was used extra medullary with a pin to help control version. The version was set at 20 of retroversion. Humeral osteotomy was performed with an oscillating saw. The head fragment was passed off the back table.  A cut protector plate was placed.  The subscapularis was again identified and immediately we took care to palpate the axillary nerve anteriorly and verify its position with gentle palpation as well as the tug test.  We then released the SGHL with bovie cautery prior to placing a curved mayo at the junction of the anterior glenoid well above the axillary nerve and bluntly dissecting the subscapularis from the capsule.  We then carefully protected the axillary nerve as we gently released the inferior capsule to fully mobilize the subscapularis.  An anterior deltoid retractor was then placed as well as a small Hohmann retractor superiorly.  The glenoid was relatively intact in the setting of an irreparable cuff tear  The remaining labrum was removed circumferentially taking great care not to disrupt the posterior capsule.   At this point we felt based on blueprint templating that a full wedge augment was necessary.  We began by using a full wedge guide to place our center pin as was templated.  We had good position of this pin and we  proceeded with our starter center drill.  This allowed for [place] to use the 15 degree full wedge reamer obtaining circumferential witness marks and good bone preparation for ingrowth.  At this point we proceeded with our center drill and had an intact vault.  We then drilled our center screw to a length of 40 mm.    We selected a 6.5 mm x 40 mm screw and the full wedge baseplate which was placed in the same orientation as our reaming.  We double checked that we had good apposition of the base plate to bone and then proceeded to place 3 locking screws and one nonlocking screw as is typical.   Next a 39 mm glenosphere was selected and impacted onto the baseplate. The center screw was tightened.  We turned attention back to the humeral side. The cut protector was removed.  We used the perform humeral sizing block to select the appropriate size which for this patient was a 4.  We then placed our center pin and reamed over it concentrically obtaining appropriate inset.  We then used our lateralizing chisel to prepare the lateral aspect of the humerus.  At that point we selected the appropriate implant trialing a 4.  Using this trial implant we trialed multiple polyethylene sizes settling on a  0 which provided good stability and range of motion without excess soft tissue tension. The offset was dialed in to match the normal anatomy. The shoulder was trialed.  There was good ROM in all planes and the shoulder was stable with no inferior translation.  The real humeral implants were opened after again confirming sizes.  The trial was removed. #5 Fiberwire x4 sutures passed through the humeral neck for subscap repair. The humeral component was press-fit obtaining a secure fit. The joint was reduced and thoroughly irrigated with pulsatile lavage. Subscap was repaired back with #5 Fiberwire sutures through bone tunnels. Hemostasis was obtained. The deltopectoral interval was reapproximated with #1 Ethibond. The  subcutaneous tissues were closed with 2-0 Vicryl and the skin was closed with running monocryl.    The wounds were cleaned and dried and an Aquacel dressing was placed. The drapes taken down. The arm was placed into sling with abduction pillow. Patient was awakened, extubated, and transferred to the recovery room in stable condition. There were no intraoperative complications. The sponge, needle, and attention counts were  correct at the end of the case.     Sander Radon, PA-C, present and scrubbed throughout the case, critical for completion in a timely fashion, and for retraction, instrumentation, closure.

## 2023-10-09 NOTE — Interval H&P Note (Signed)
All questions answered, patient wants to proceed with procedure. ? ?

## 2023-10-09 NOTE — Anesthesia Procedure Notes (Addendum)
Anesthesia Regional Block: Interscalene brachial plexus block   Pre-Anesthetic Checklist: , timeout performed,  Correct Patient, Correct Site, Correct Laterality,  Correct Procedure, Correct Position, site marked,  Risks and benefits discussed,  Surgical consent,  Pre-op evaluation,  At surgeon's request and post-op pain management  Laterality: Right  Prep: chloraprep       Needles:  Injection technique: Single-shot  Needle Type: Echogenic Stimulator Needle     Needle Length: 5cm  Needle Gauge: 22     Additional Needles:   Procedures:, nerve stimulator,,, ultrasound used (permanent image in chart),,     Nerve Stimulator or Paresthesia:  Response: hand, 0.45 mA  Additional Responses:   Narrative:  Start time: 10/09/2023 7:00 AM End time: 10/09/2023 7:05 AM Injection made incrementally with aspirations every 5 mL.  Performed by: Personally  Anesthesiologist: Bethena Midget, MD  Additional Notes: Functioning IV was confirmed and monitors were applied.  A 50mm 22ga Arrow echogenic stimulator needle was used. Sterile prep and drape,hand hygiene and sterile gloves were used. Ultrasound guidance: relevant anatomy identified, needle position confirmed, local anesthetic spread visualized around nerve(s)., vascular puncture avoided.  Image printed for medical record. Negative aspiration and negative test dose prior to incremental administration of local anesthetic. The patient tolerated the procedure well.

## 2023-10-10 ENCOUNTER — Encounter (HOSPITAL_BASED_OUTPATIENT_CLINIC_OR_DEPARTMENT_OTHER): Payer: Self-pay | Admitting: Orthopaedic Surgery

## 2023-10-13 ENCOUNTER — Other Ambulatory Visit: Payer: Self-pay

## 2023-10-13 ENCOUNTER — Encounter: Payer: Self-pay | Admitting: Rehabilitative and Restorative Service Providers"

## 2023-10-13 ENCOUNTER — Ambulatory Visit: Payer: Medicare HMO | Attending: Orthopaedic Surgery | Admitting: Rehabilitative and Restorative Service Providers"

## 2023-10-13 DIAGNOSIS — Z96612 Presence of left artificial shoulder joint: Secondary | ICD-10-CM | POA: Diagnosis not present

## 2023-10-13 DIAGNOSIS — R600 Localized edema: Secondary | ICD-10-CM | POA: Insufficient documentation

## 2023-10-13 DIAGNOSIS — G8929 Other chronic pain: Secondary | ICD-10-CM | POA: Insufficient documentation

## 2023-10-13 DIAGNOSIS — M6281 Muscle weakness (generalized): Secondary | ICD-10-CM | POA: Insufficient documentation

## 2023-10-13 DIAGNOSIS — M25512 Pain in left shoulder: Secondary | ICD-10-CM | POA: Insufficient documentation

## 2023-10-13 DIAGNOSIS — Z5189 Encounter for other specified aftercare: Secondary | ICD-10-CM | POA: Diagnosis not present

## 2023-10-13 DIAGNOSIS — R293 Abnormal posture: Secondary | ICD-10-CM | POA: Insufficient documentation

## 2023-10-13 NOTE — Therapy (Signed)
OUTPATIENT PHYSICAL THERAPY SHOULDER EVALUATION   Patient Name: Joshua [name] MRN: [id number] DOB:1946-08-03, 77 y.o., male Today's Date: 10/13/2023  END OF SESSION:  PT End of Session - 10/13/23 1056     Visit Number 1    Number of Visits 16    Date for PT Re-Evaluation 12/12/23    Authorization Type [employer] medicare *auth to be submitted today    PT Start Time 0804    PT Stop Time 0850    PT Time Calculation (min) 46 min    Activity Tolerance Patient tolerated treatment well    Behavior During Therapy [facility] for tasks assessed/performed             Past Medical History:  Diagnosis Date   Abnormal findings on diagnostic imaging of lung 09/23/2019   09/22/2019-lung cancer screening-centrilobular and paraseptal emphysema, calcified granulomata noted bilaterally, no suspicious nodules, lung RADS 1, repeat in 12 months    Anxiety    Atrial fibrillation (HCC) 09/23/2019   Centrilobular emphysema (HCC) 09/23/2019   09/22/2019-lung cancer screening-centrilobular and paraseptal emphysema, calcified granulomata noted bilaterally, no suspicious nodules, lung RADS 1, repeat in 12 months  09/23/2019-FVC 3.74 (81% predicted), postbronchodilator ratio 80, postbronchodilator FEV1 2.88 (86% predicted), no bronchodilator response, DLCO 19.44 (73% predicted)   DDD (degenerative disc disease), cervical 05/13/2008   Cervical Disc Degeneration   Depression    ETOH abuse 10/01/2023   continues to drink 1-3 drinks/ day   History of adenomatous polyp of colon 03/03/2018   05/14/17: Colonoscopy: nonadvanced adenoma, f/u 5 yrs, use SuPrep, Murphy/GAP  Last Assessment & Plan:  Relevant Hx: Course: Daily Update: Today's Plan:   History of kidney stones    Hyperlipidemia    Kidney stone    Nephrolithiasis 05/22/2011   Nontraumatic incomplete tear of right rotator cuff 10/14/2018   Other and unspecified hyperlipidemia 11/20/2009   Hyperlipidemia   RBBB 06/09/2012   Secondary hypercoagulable  state (HCC) 09/28/2019   Shortness of breath 09/23/2019   Past Surgical History:  Procedure Laterality Date   CARDIOVERSION N/A 10/15/2019   Procedure: CARDIOVERSION;  Surgeon: Jake Bathe, MD;  Location: [facility] ENDOSCOPY;  Service: Cardiovascular;  Laterality: N/A;   CARDIOVERSION N/A 11/29/2019   Procedure: CARDIOVERSION;  Surgeon: Lars Masson, MD;  Location: [facility] ENDOSCOPY;  Service: Cardiovascular;  Laterality: N/A;   HEMORRHOID SURGERY     REVERSE SHOULDER ARTHROPLASTY Left 10/09/2023   Procedure: REVERSE SHOULDER ARTHROPLASTY;  Surgeon: Bjorn Pippin, MD;  Location: [facility];  Service: Orthopedics;  Laterality: Left;   TOTAL HIP ARTHROPLASTY Right 12/21/2021   Procedure: TOTAL HIP ARTHROPLASTY ANTERIOR APPROACH;  Surgeon: Jodi Geralds, MD;  Location: [facility];  Service: Orthopedics;  Laterality: Right;   VARICOSE VEIN SURGERY Right    Patient Active Problem List   Diagnosis Date Noted   Alcohol-induced mood disorder with depressive symptoms (HCC) 06/10/2023   Benign prostatic hyperplasia without lower urinary tract symptoms 06/05/2023   Dyslipidemia 06/05/2023   Fall 06/05/2023   History of peripheral neuropathy 06/05/2023   Subdural hematoma (HCC) 06/04/2023   Pain in finger of left hand 03/12/2023   Alcohol abuse 05/31/2022   Disorder of thoracic spine 05/31/2022   COVID-19 virus infection 03/04/2022   Acute bronchitis 03/04/2022   OSA (obstructive sleep apnea) 03/04/2022   PAF (paroxysmal atrial fibrillation) (HCC) 12/13/2021   Anxiety 11/08/2021   Aphasia 11/08/2021   Bipolar 1 disorder (HCC) 11/08/2021   CKD (chronic kidney disease), stage [name] (HCC) 11/08/2021  Expressive aphasia 11/08/2021   Prolonged QT interval 11/08/2021   Recurrent falls 11/08/2021   Primary osteoarthritis of right hip 10/26/2021   Abnormal liver function tests 09/10/2021   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   Urinary hesitancy 03/08/2021   Thoracic aortic aneurysm without rupture (HCC)  03/08/2021   Senile purpura (HCC) 09/07/2020   Aortic atherosclerosis (HCC) 09/07/2020   Sinus bradycardia 03/31/2020   Essential hypertension 03/31/2020   Major depressive disorder 03/07/2020   Essential tremor 03/07/2020   Tremor 01/21/2020   Obesity (BMI 30-39.9) 12/10/2019   Chest pain of uncertain etiology 12/10/2019   Secondary hypercoagulable state (HCC) 09/28/2019   Centrilobular emphysema (HCC) 09/23/2019   Former smoker 09/23/2019   Abnormal findings on diagnostic imaging of lung 09/23/2019   Atrial fibrillation (HCC) 09/23/2019   At risk for sleep apnea 09/23/2019   Shortness of breath 09/23/2019   Nontraumatic incomplete tear of right rotator cuff 10/14/2018   History of adenomatous polyp of colon 03/03/2018   RBBB 06/09/2012   Nephrolithiasis 05/22/2011   Mixed hyperlipidemia 11/20/2009   DDD (degenerative disc disease), cervical 05/13/2008    PCP: Everrett Coombe, DO REFERRING PROVIDER: Ramond Marrow, MD REFERRING DIAG: [phone] (ICD-10-CM) - Status post reverse total arthroplasty of left shoulder  THERAPY DIAG:  Acute pain of left shoulder  Muscle weakness (generalized)  Abnormal posture  Localized edema  Rationale for Evaluation and Treatment: Rehabilitation  ONSET DATE: 10/09/23 DATE OF SURGERY  SUBJECTIVE:                                                                                                                                                                                     SUBJECTIVE STATEMENT: The patient reports 1 year history of L shoulder pain that did not respond to therapy. He underwent a reverse total shoulder replacement on 12/5 and notes  Hand dominance: Right  PERTINENT HISTORY: H/o a-fib, loop recorder, ETOH use, depression  PAIN:  Are you having pain? Yes: NPRS scale: 5-9/10 Pain location: biceps region Pain description: shoulder is not painful at all-- arm musculature is painful Aggravating factors: sitting/sleeping Relieving  factors: medicine  PRECAUTIONS: Shoulder and Other: reverse total shoulder replacement  to follow protocol for reverse total shoulder  WEIGHT BEARING RESTRICTIONS: Yes < 1 lb per protocol  FALLS:  Has patient fallen in last 6 months? Yes. Number of falls 2x in the past 6 months. He has a h/o drinking and felt like gabapentin + alcohol led to falls.    LIVING ENVIRONMENT: Lives with: lives with their spouse Lives in: House/apartment Stairs:  2 to garage and 2 to deck in back  OCCUPATION: Retired from [employer]; enjoys Chiropractor things  PLOF: Independent  PATIENT GOALS:reduced pain and improve use of the arm "as close to normal as I can get"  NEXT MD VISIT:  10/16/23 follow up visit  OBJECTIVE:  Note: Objective measures were completed at Evaluation unless otherwise noted.  PATIENT SURVEYS:  FOTO * not entered -- plan to get next session*  COGNITION: Overall cognitive status: Within functional limits for tasks assessed     SENSATION: WFL  EDEMA: Pocket of edema in distal medial arm, bruising in proximal biceps bruising and edema noted dressing in place  POSTURE: Rounded shoulders and forward head position  UPPER EXTREMITY ROM:   will assess next visit (< 1 week-- focused on elbow, wrist, positioning today) Passive ROM Right eval Left eval  Shoulder flexion    Shoulder extension    Shoulder abduction    Shoulder adduction    Shoulder internal rotation    Shoulder external rotation    Elbow flexion    Elbow extension    Wrist flexion    Wrist extension    Wrist ulnar deviation    Wrist radial deviation    Wrist pronation    Wrist supination    (Blank rows = not tested)  UPPER EXTREMITY MMT:  wrist flexion/extension grip strength 35 L and 60 R UNABLE-- will assess once protocol allows MMT Right eval Left eval  Shoulder flexion    Shoulder extension    Shoulder abduction    Shoulder adduction    Shoulder internal rotation    Shoulder external rotation     Middle trapezius    Lower trapezius    Elbow flexion    Elbow extension    Wrist flexion    Wrist extension    Wrist ulnar deviation    Wrist radial deviation    Wrist pronation    Wrist supination    Grip strength (lbs)    (Blank rows = not tested)   [facility] Adult PT Treatment:                                                DATE: 10/13/23 Therapeutic Exercise: Removed sling and began with grip strength  Wrist flexion/extension seated with elbow supported on pillows Seated neck AROM rotation and flexion/extension Supine positioning with elbow flexion/extension PROM Manual Therapy: Elbow PROM flexion/extension Self Care: Discussed home positioning and when sling required (when sleeping and walking) Discussed HEP   PATIENT EDUCATION: Education details: HEP Person educated: Patient and spouse Education method: Explanation, Facilities manager, and Handouts Education comprehension: verbalized understanding, returned demonstration, and needs further education  HOME EXERCISE PROGRAM: Access Code: [email or web address] URL: [email or web address] Date: 10/13/2023 Prepared by: Margretta Ditty  Exercises - Seated Cervical Rotation AROM  - 2 x daily - 7 x weekly - 1 sets - 10 reps - Seated Cervical Extension AROM  - 2 x daily - 7 x weekly - 1 sets - 10 reps - Wrist AROM Flexion Extension  - 2 x daily - 7 x weekly - 1 sets - 10 reps - seated ball squeeze  - 2 x daily - 7 x weekly - 1 sets - 10 reps  ASSESSMENT:  CLINICAL IMPRESSION: Patient is a 77 y.o. male who was seen today for physical therapy evaluation and treatment s/p Left reverse total shoulder. He presents with impairments of localized edema, weakness, dec'd ROM, pain, neck tightness, and dec'd grip strength.  OBJECTIVE IMPAIRMENTS: decreased activity tolerance, decreased ROM, decreased strength, increased edema, increased fascial restrictions, increased muscle spasms, impaired flexibility, impaired tone, postural  dysfunction, and pain.   ACTIVITY LIMITATIONS: carrying, lifting, and reach over head  PARTICIPATION LIMITATIONS: cleaning, community activity, and yard work  PERSONAL FACTORS: 3+ comorbidities: h/o depression, ETOH use, falls, and a-fib  are also affecting patient's functional outcome.   REHAB POTENTIAL: Good  CLINICAL DECISION MAKING: Stable/uncomplicated  EVALUATION COMPLEXITY: Low   GOALS: Goals reviewed with patient? Yes  SHORT TERM GOALS: Target date: 11/13/23  The patient will be indep with HEP. Baseline: initiated at eval Goal status: INITIAL  2.  The patient will have PROM documented. Baseline:  began <1 week with elbow and wrist ROM + grip and neck ROM Goal status: INITIAL   LONG TERM GOALS: Target date: 12/13/23  The patient will be indep with HEP. Baseline:  initiated at eval Goal status: INITIAL  2.  The patient will improve ROM to protocol ranges of flexion to 110-120, ER 30-45. Baseline: not assessed at eval Goal status: INITIAL  3.  The patient will tolerate isometrics below shoulder level. Baseline:  not assessed at eval Goal status: INITIAL  4.  The patient will reach into shoulder flexion/scaption to 90 degrees to demo improved strength against gravity. Baseline: not assessed at eval. Goal status: INITIAL  5.  The patient will reduce pain to < or equal to 2/10. Baseline:  5-9/10 Goal status: INITIAL   PLAN:  PT FREQUENCY: 1-2x/week  PT DURATION: 8 weeks  PLANNED INTERVENTIONS: 97164- PT Re-evaluation, 97110-Therapeutic exercises, 97530- Therapeutic activity, 97112- Neuromuscular re-education, 97535- Self Care, 51884- Manual therapy, 97014- Electrical stimulation (unattended), 97016- Vasopneumatic device, Patient/Family education, Taping, Dry Needling, Joint mobilization, Cryotherapy, and Moist heat  PLAN FOR NEXT SESSION: Do FOTO (not in at eval),    Referring diagnosis? Z66.063 (ICD-10-CM) - Status post reverse total arthroplasty of left  shoulder Treatment diagnosis? (if different than referring diagnosis)   Acute pain of left shoulder  Muscle weakness (generalized)  Abnormal posture  What was this (referring dx) caused by? [x]  Surgery []  Fall []  Ongoing issue []  Arthritis []  Other: ____________  Laterality: []  Rt [x]  Lt []  Both  Check all possible CPT codes:  *CHOOSE 10 OR LESS*    See Planned Interventions listed in the Plan section of the Evaluation.    Braedyn Riggle, PT 10/13/2023, 12:37 PM

## 2023-10-14 ENCOUNTER — Ambulatory Visit: Payer: Medicare HMO | Admitting: Family Medicine

## 2023-10-16 ENCOUNTER — Ambulatory Visit: Payer: Medicare HMO | Admitting: Rehabilitative and Restorative Service Providers"

## 2023-10-16 DIAGNOSIS — M19012 Primary osteoarthritis, left shoulder: Secondary | ICD-10-CM | POA: Diagnosis not present

## 2023-10-17 ENCOUNTER — Ambulatory Visit: Payer: Medicare HMO | Admitting: Physical Therapy

## 2023-10-17 ENCOUNTER — Encounter: Payer: Self-pay | Admitting: Physical Therapy

## 2023-10-17 DIAGNOSIS — R293 Abnormal posture: Secondary | ICD-10-CM | POA: Diagnosis not present

## 2023-10-17 DIAGNOSIS — R600 Localized edema: Secondary | ICD-10-CM | POA: Diagnosis not present

## 2023-10-17 DIAGNOSIS — G8929 Other chronic pain: Secondary | ICD-10-CM | POA: Diagnosis not present

## 2023-10-17 DIAGNOSIS — M25512 Pain in left shoulder: Secondary | ICD-10-CM | POA: Diagnosis not present

## 2023-10-17 DIAGNOSIS — Z96612 Presence of left artificial shoulder joint: Secondary | ICD-10-CM | POA: Diagnosis not present

## 2023-10-17 DIAGNOSIS — Z5189 Encounter for other specified aftercare: Secondary | ICD-10-CM | POA: Diagnosis not present

## 2023-10-17 DIAGNOSIS — M6281 Muscle weakness (generalized): Secondary | ICD-10-CM

## 2023-10-17 NOTE — Therapy (Signed)
OUTPATIENT PHYSICAL THERAPY SHOULDER TREATMENT   Patient Name: Joshua [name] [facility] MRN: [id number] DOB:1945-11-10, 77 y.o., male Today's Date: 10/17/2023  END OF SESSION:  PT End of Session - 10/17/23 1137     Visit Number 2    Number of Visits 16    Date for PT Re-Evaluation 12/12/23    Authorization Type [employer] medicare    Authorization - Visit Number 2    Progress Note Due on Visit 10    PT Start Time 1100    PT Stop Time 1140    PT Time Calculation (min) 40 min    Activity Tolerance Patient tolerated treatment well    Behavior During Therapy WFL for tasks assessed/performed              Past Medical History:  Diagnosis Date   Abnormal findings on diagnostic imaging of lung 09/23/2019   09/22/2019-lung cancer screening-centrilobular and paraseptal emphysema, calcified granulomata noted bilaterally, no suspicious nodules, lung RADS 1, repeat in 12 months    Anxiety    Atrial fibrillation (HCC) 09/23/2019   Centrilobular emphysema (HCC) 09/23/2019   09/22/2019-lung cancer screening-centrilobular and paraseptal emphysema, calcified granulomata noted bilaterally, no suspicious nodules, lung RADS 1, repeat in 12 months  09/23/2019-FVC 3.74 (81% predicted), postbronchodilator ratio 80, postbronchodilator FEV1 2.88 (86% predicted), no bronchodilator response, DLCO 19.44 (73% predicted)   DDD (degenerative disc disease), cervical 05/13/2008   Cervical Disc Degeneration   Depression    ETOH abuse 10/01/2023   continues to drink 1-3 drinks/ day   History of adenomatous polyp of colon 03/03/2018   05/14/17: Colonoscopy: nonadvanced adenoma, f/u 5 yrs, use SuPrep, Murphy/GAP  Last Assessment & Plan:  Relevant Hx: Course: Daily Update: Today's Plan:   History of kidney stones    Hyperlipidemia    Kidney stone    Nephrolithiasis 05/22/2011   Nontraumatic incomplete tear of right rotator cuff 10/14/2018   Other and unspecified hyperlipidemia 11/20/2009   Hyperlipidemia   RBBB  06/09/2012   Secondary hypercoagulable state (HCC) 09/28/2019   Shortness of breath 09/23/2019   Past Surgical History:  Procedure Laterality Date   CARDIOVERSION N/A 10/15/2019   Procedure: CARDIOVERSION;  Surgeon: Jake Bathe, MD;  Location: [facility] ENDOSCOPY;  Service: Cardiovascular;  Laterality: N/A;   CARDIOVERSION N/A 11/29/2019   Procedure: CARDIOVERSION;  Surgeon: Lars Masson, MD;  Location: [facility] ENDOSCOPY;  Service: Cardiovascular;  Laterality: N/A;   HEMORRHOID SURGERY     REVERSE SHOULDER ARTHROPLASTY Left 10/09/2023   Procedure: REVERSE SHOULDER ARTHROPLASTY;  Surgeon: Bjorn Pippin, MD;  Location: [facility];  Service: Orthopedics;  Laterality: Left;   TOTAL HIP ARTHROPLASTY Right 12/21/2021   Procedure: TOTAL HIP ARTHROPLASTY ANTERIOR APPROACH;  Surgeon: Jodi Geralds, MD;  Location: [facility];  Service: Orthopedics;  Laterality: Right;   VARICOSE VEIN SURGERY Right    Patient Active Problem List   Diagnosis Date Noted   Alcohol-induced mood disorder with depressive symptoms (HCC) 06/10/2023   Benign prostatic hyperplasia without lower urinary tract symptoms 06/05/2023   Dyslipidemia 06/05/2023   Fall 06/05/2023   History of peripheral neuropathy 06/05/2023   Subdural hematoma (HCC) 06/04/2023   Pain in finger of left hand 03/12/2023   Alcohol abuse 05/31/2022   Disorder of thoracic spine 05/31/2022   COVID-19 virus infection 03/04/2022   Acute bronchitis 03/04/2022   OSA (obstructive sleep apnea) 03/04/2022   PAF (paroxysmal atrial fibrillation) (HCC) 12/13/2021   Anxiety 11/08/2021   Aphasia 11/08/2021   Bipolar 1 disorder (  HCC) 11/08/2021   CKD (chronic kidney disease), stage III (HCC) 11/08/2021   Expressive aphasia 11/08/2021   Prolonged QT interval 11/08/2021   Recurrent falls 11/08/2021   Primary osteoarthritis of right hip 10/26/2021   Abnormal liver function tests 09/10/2021   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   Urinary hesitancy 03/08/2021   Thoracic  aortic aneurysm without rupture (HCC) 03/08/2021   Senile purpura (HCC) 09/07/2020   Aortic atherosclerosis (HCC) 09/07/2020   Sinus bradycardia 03/31/2020   Essential hypertension 03/31/2020   Major depressive disorder 03/07/2020   Essential tremor 03/07/2020   Tremor 01/21/2020   Obesity (BMI 30-39.9) 12/10/2019   Chest pain of uncertain etiology 12/10/2019   Secondary hypercoagulable state (HCC) 09/28/2019   Centrilobular emphysema (HCC) 09/23/2019   Former smoker 09/23/2019   Abnormal findings on diagnostic imaging of lung 09/23/2019   Atrial fibrillation (HCC) 09/23/2019   At risk for sleep apnea 09/23/2019   Shortness of breath 09/23/2019   Nontraumatic incomplete tear of right rotator cuff 10/14/2018   History of adenomatous polyp of colon 03/03/2018   RBBB 06/09/2012   Nephrolithiasis 05/22/2011   Mixed hyperlipidemia 11/20/2009   DDD (degenerative disc disease), cervical 05/13/2008    PCP: Everrett Coombe, DO REFERRING PROVIDER: Ramond Marrow, MD REFERRING DIAG: [phone] (ICD-10-CM) - Status post reverse total arthroplasty of left shoulder  THERAPY DIAG:  Acute pain of left shoulder  Muscle weakness (generalized)  Rationale for Evaluation and Treatment: Rehabilitation  ONSET DATE: 10/09/23 DATE OF SURGERY  SUBJECTIVE:                                                                                                                                                                                     SUBJECTIVE STATEMENT: Pt states he is feeling better than last visit. He states he saw MD yesterday who states he is "doing well" Hand dominance: Right  PERTINENT HISTORY: H/o a-fib, loop recorder, ETOH use, depression  PAIN:  Are you having pain? Yes: NPRS scale: 5/10 Pain location: biceps region Pain description: shoulder is not painful at all-- arm musculature is painful Aggravating factors: sitting/sleeping Relieving factors: medicine  PRECAUTIONS: Shoulder and  Other: reverse total shoulder replacement  to follow protocol for reverse total shoulder  WEIGHT BEARING RESTRICTIONS: Yes < 1 lb per protocol  FALLS:  Has patient fallen in last 6 months? Yes. Number of falls 2x in the past 6 months. He has a h/o drinking and felt like gabapentin + alcohol led to falls.    LIVING ENVIRONMENT: Lives with: lives with their spouse Lives in: House/apartment Stairs:  2 to garage and 2 to deck in back  OCCUPATION: Retired from [employer] pilot; enjoys Chiropractor things  PLOF: Independent  PATIENT GOALS:reduced pain and improve use of the arm "as close to normal as I can get"  NEXT MD VISIT:  10/16/23 follow up visit  OBJECTIVE:  Note: Objective measures were completed at Evaluation unless otherwise noted.  PATIENT SURVEYS:  FOTO * not entered -- plan to get next session*   EDEMA: Pocket of edema in distal medial arm, bruising in proximal biceps bruising and edema noted dressing in place  POSTURE: Rounded shoulders and forward head position  UPPER EXTREMITY ROM:   will assess next visit (< 1 week-- focused on elbow, wrist, positioning today) Passive ROM Right eval Left 12/13  Shoulder flexion  75  Shoulder extension    Shoulder abduction    Shoulder adduction    Shoulder internal rotation    Shoulder external rotation  0  Elbow flexion    Elbow extension    Wrist flexion    Wrist extension    Wrist ulnar deviation    Wrist radial deviation    Wrist pronation    Wrist supination    (Blank rows = not tested)  UPPER EXTREMITY MMT:  wrist flexion/extension grip strength 35 L and 60 R UNABLE-- will assess once protocol allows MMT Right eval Left eval  Shoulder flexion    Shoulder extension    Shoulder abduction    Shoulder adduction    Shoulder internal rotation    Shoulder external rotation    Middle trapezius    Lower trapezius    Elbow flexion    Elbow extension    Wrist flexion    Wrist extension    Wrist ulnar deviation    Wrist  radial deviation    Wrist pronation    Wrist supination    Grip strength (lbs)    (Blank rows = not tested)   [facility] Adult PT Treatment:                                                DATE: 10/17/23 Therapeutic Exercise: Pendulums all directions x 1 minute Scap squeeze x 15 Elbow flex/ext AROM x 15 Manual Therapy: PROM Lt shoulder ER and flexion per protocol in scapular plane  Therapeutic Activity: FOTO 44  Modalities: Vaso Lt shoulder x 10 min low pressure 34 degrees   [facility] Adult PT Treatment:                                                DATE: 10/13/23 Therapeutic Exercise: Removed sling and began with grip strength  Wrist flexion/extension seated with elbow supported on pillows Seated neck AROM rotation and flexion/extension Supine positioning with elbow flexion/extension PROM Manual Therapy: Elbow PROM flexion/extension Self Care: Discussed home positioning and when sling required (when sleeping and walking) Discussed HEP   PATIENT EDUCATION: Education details: HEP Person educated: Patient and spouse Education method: Explanation, Facilities manager, and Handouts Education comprehension: verbalized understanding, returned demonstration, and needs further education  HOME EXERCISE PROGRAM: Access Code: [email or web address] URL: [email or web address] Date: 10/13/2023 Prepared by: Margretta Ditty  Exercises - Seated Cervical Rotation AROM  - 2 x daily - 7 x weekly - 1 sets - 10 reps - Seated Cervical Extension AROM  - 2 x daily - 7 x weekly - 1  sets - 10 reps - Wrist AROM Flexion Extension  - 2 x daily - 7 x weekly - 1 sets - 10 reps - seated ball squeeze  - 2 x daily - 7 x weekly - 1 sets - 10 reps  ASSESSMENT:  CLINICAL IMPRESSION: Initiated PROM for Lt shoulder and updated HEP to include pendulums. Pt with good tolerance throughout session.   OBJECTIVE IMPAIRMENTS: decreased activity tolerance, decreased ROM, decreased strength, increased edema, increased  fascial restrictions, increased muscle spasms, impaired flexibility, impaired tone, postural dysfunction, and pain.     GOALS: Goals reviewed with patient? Yes  SHORT TERM GOALS: Target date: 11/13/23  The patient will be indep with HEP. Baseline: initiated at eval Goal status: INITIAL  2.  The patient will have PROM documented. Baseline:  began <1 week with elbow and wrist ROM + grip and neck ROM Goal status: MET   LONG TERM GOALS: Target date: 12/13/23  The patient will be indep with HEP. Baseline:  initiated at eval Goal status: INITIAL  2.  The patient will improve ROM to protocol ranges of flexion to 110-120, ER 30-45. Baseline: not assessed at eval Goal status: INITIAL  3.  The patient will tolerate isometrics below shoulder level. Baseline:  not assessed at eval Goal status: INITIAL  4.  The patient will reach into shoulder flexion/scaption to 90 degrees to demo improved strength against gravity. Baseline: not assessed at eval. Goal status: INITIAL  5.  The patient will reduce pain to < or equal to 2/10. Baseline:  5-9/10 Goal status: INITIAL 6.  Pt will improve FOTO to >= 74 Baseline:  Goal status: INITIAL    PLAN:  PT FREQUENCY: 1-2x/week  PT DURATION: 8 weeks  PLANNED INTERVENTIONS: 97164- PT Re-evaluation, 97110-Therapeutic exercises, 97530- Therapeutic activity, 97112- Neuromuscular re-education, 97535- Self Care, [id number]- Manual therapy, 97014- Electrical stimulation (unattended), 97016- Vasopneumatic device, Patient/Family education, Taping, Dry Needling, Joint mobilization, Cryotherapy, and Moist heat  PLAN FOR NEXT SESSION: progress per protocol    Tanicia Wolaver, PT 10/17/2023, 11:45 AM

## 2023-10-18 DIAGNOSIS — Z95818 Presence of other cardiac implants and grafts: Secondary | ICD-10-CM | POA: Diagnosis not present

## 2023-10-18 DIAGNOSIS — R55 Syncope and collapse: Secondary | ICD-10-CM | POA: Diagnosis not present

## 2023-10-21 ENCOUNTER — Ambulatory Visit: Payer: Medicare HMO | Admitting: Physical Therapy

## 2023-10-21 ENCOUNTER — Encounter: Payer: Self-pay | Admitting: Physical Therapy

## 2023-10-21 DIAGNOSIS — M6281 Muscle weakness (generalized): Secondary | ICD-10-CM | POA: Diagnosis not present

## 2023-10-21 DIAGNOSIS — G8929 Other chronic pain: Secondary | ICD-10-CM | POA: Diagnosis not present

## 2023-10-21 DIAGNOSIS — R293 Abnormal posture: Secondary | ICD-10-CM | POA: Diagnosis not present

## 2023-10-21 DIAGNOSIS — R600 Localized edema: Secondary | ICD-10-CM

## 2023-10-21 DIAGNOSIS — Z96612 Presence of left artificial shoulder joint: Secondary | ICD-10-CM | POA: Diagnosis not present

## 2023-10-21 DIAGNOSIS — M25512 Pain in left shoulder: Secondary | ICD-10-CM

## 2023-10-21 DIAGNOSIS — Z5189 Encounter for other specified aftercare: Secondary | ICD-10-CM | POA: Diagnosis not present

## 2023-10-21 NOTE — Therapy (Signed)
OUTPATIENT PHYSICAL THERAPY SHOULDER TREATMENT   Patient Name: Joshua [name] [facility] III MRN: [id number] DOB:24-Oct-1946, 77 y.o., male Today's Date: 10/21/2023  END OF SESSION:  PT End of Session - 10/21/23 1044     Visit Number 3    Number of Visits 16    Date for PT Re-Evaluation 12/12/23    Authorization Type [employer] medicare    Authorization - Visit Number 3    Progress Note Due on Visit 10    PT Start Time 1015    PT Stop Time 1055    PT Time Calculation (min) 40 min    Activity Tolerance Patient tolerated treatment well    Behavior During Therapy WFL for tasks assessed/performed               Past Medical History:  Diagnosis Date   Abnormal findings on diagnostic imaging of lung 09/23/2019   09/22/2019-lung cancer screening-centrilobular and paraseptal emphysema, calcified granulomata noted bilaterally, no suspicious nodules, lung RADS 1, repeat in 12 months    Anxiety    Atrial fibrillation (HCC) 09/23/2019   Centrilobular emphysema (HCC) 09/23/2019   09/22/2019-lung cancer screening-centrilobular and paraseptal emphysema, calcified granulomata noted bilaterally, no suspicious nodules, lung RADS 1, repeat in 12 months  09/23/2019-FVC 3.74 (81% predicted), postbronchodilator ratio 80, postbronchodilator FEV1 2.88 (86% predicted), no bronchodilator response, DLCO 19.44 (73% predicted)   DDD (degenerative disc disease), cervical 05/13/2008   Cervical Disc Degeneration   Depression    ETOH abuse 10/01/2023   continues to drink 1-3 drinks/ day   History of adenomatous polyp of colon 03/03/2018   05/14/17: Colonoscopy: nonadvanced adenoma, f/u 5 yrs, use SuPrep, Murphy/GAP  Last Assessment & Plan:  Relevant Hx: Course: Daily Update: Today's Plan:   History of kidney stones    Hyperlipidemia    Kidney stone    Nephrolithiasis 05/22/2011   Nontraumatic incomplete tear of right rotator cuff 10/14/2018   Other and unspecified hyperlipidemia 11/20/2009   Hyperlipidemia    RBBB 06/09/2012   Secondary hypercoagulable state (HCC) 09/28/2019   Shortness of breath 09/23/2019   Past Surgical History:  Procedure Laterality Date   CARDIOVERSION N/A 10/15/2019   Procedure: CARDIOVERSION;  Surgeon: Jake Bathe, MD;  Location: [facility] ENDOSCOPY;  Service: Cardiovascular;  Laterality: N/A;   CARDIOVERSION N/A 11/29/2019   Procedure: CARDIOVERSION;  Surgeon: Lars Masson, MD;  Location: [facility] ENDOSCOPY;  Service: Cardiovascular;  Laterality: N/A;   HEMORRHOID SURGERY     REVERSE SHOULDER ARTHROPLASTY Left 10/09/2023   Procedure: REVERSE SHOULDER ARTHROPLASTY;  Surgeon: Bjorn Pippin, MD;  Location: [facility];  Service: Orthopedics;  Laterality: Left;   TOTAL HIP ARTHROPLASTY Right 12/21/2021   Procedure: TOTAL HIP ARTHROPLASTY ANTERIOR APPROACH;  Surgeon: Jodi Geralds, MD;  Location: [facility]S;  Service: Orthopedics;  Laterality: Right;   VARICOSE VEIN SURGERY Right    Patient Active Problem List   Diagnosis Date Noted   Alcohol-induced mood disorder with depressive symptoms (HCC) 06/10/2023   Benign prostatic hyperplasia without lower urinary tract symptoms 06/05/2023   Dyslipidemia 06/05/2023   Fall 06/05/2023   History of peripheral neuropathy 06/05/2023   Subdural hematoma (HCC) 06/04/2023   Pain in finger of left hand 03/12/2023   Alcohol abuse 05/31/2022   Disorder of thoracic spine 05/31/2022   COVID-19 virus infection 03/04/2022   Acute bronchitis 03/04/2022   OSA (obstructive sleep apnea) 03/04/2022   PAF (paroxysmal atrial fibrillation) (HCC) 12/13/2021   Anxiety 11/08/2021   Aphasia 11/08/2021   Bipolar 1  disorder (HCC) 11/08/2021   CKD (chronic kidney disease), stage III (HCC) 11/08/2021   Expressive aphasia 11/08/2021   Prolonged QT interval 11/08/2021   Recurrent falls 11/08/2021   Primary osteoarthritis of right hip 10/26/2021   Abnormal liver function tests 09/10/2021   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   Urinary hesitancy 03/08/2021    Thoracic aortic aneurysm without rupture (HCC) 03/08/2021   Senile purpura (HCC) 09/07/2020   Aortic atherosclerosis (HCC) 09/07/2020   Sinus bradycardia 03/31/2020   Essential hypertension 03/31/2020   Major depressive disorder 03/07/2020   Essential tremor 03/07/2020   Tremor 01/21/2020   Obesity (BMI 30-39.9) 12/10/2019   Chest pain of uncertain etiology 12/10/2019   Secondary hypercoagulable state (HCC) 09/28/2019   Centrilobular emphysema (HCC) 09/23/2019   Former smoker 09/23/2019   Abnormal findings on diagnostic imaging of lung 09/23/2019   Atrial fibrillation (HCC) 09/23/2019   At risk for sleep apnea 09/23/2019   Shortness of breath 09/23/2019   Nontraumatic incomplete tear of right rotator cuff 10/14/2018   History of adenomatous polyp of colon 03/03/2018   RBBB 06/09/2012   Nephrolithiasis 05/22/2011   Mixed hyperlipidemia 11/20/2009   DDD (degenerative disc disease), cervical 05/13/2008    PCP: Everrett Coombe, DO REFERRING PROVIDER: Ramond Marrow, MD REFERRING DIAG: [phone] (ICD-10-CM) - Status post reverse total arthroplasty of left shoulder  THERAPY DIAG:  Acute pain of left shoulder  Muscle weakness (generalized)  Localized edema  Rationale for Evaluation and Treatment: Rehabilitation  ONSET DATE: 10/09/23 DATE OF SURGERY  SUBJECTIVE:                                                                                                                                                                                     SUBJECTIVE STATEMENT: Pt states he is feeling "more flexible". He states he is able to sleep better Hand dominance: Right  PERTINENT HISTORY: H/o a-fib, loop recorder, ETOH use, depression  PAIN:  Are you having pain? Yes: NPRS scale: 4/10 Pain location: biceps region Pain description: shoulder is not painful at all-- arm musculature is painful Aggravating factors: sitting/sleeping Relieving factors: medicine  PRECAUTIONS: Shoulder and Other:  reverse total shoulder replacement  to follow protocol for reverse total shoulder  WEIGHT BEARING RESTRICTIONS: Yes < 1 lb per protocol  FALLS:  Has patient fallen in last 6 months? Yes. Number of falls 2x in the past 6 months. He has a h/o drinking and felt like gabapentin + alcohol led to falls.    LIVING ENVIRONMENT: Lives with: lives with their spouse Lives in: House/apartment Stairs:  2 to garage and 2 to deck in back  OCCUPATION: Retired from [employer] pilot; enjoys Chiropractor things  PLOF:  Independent  PATIENT GOALS:reduced pain and improve use of the arm "as close to normal as I can get"  NEXT MD VISIT:  10/16/23 follow up visit  OBJECTIVE:  Note: Objective measures were completed at Evaluation unless otherwise noted.  PATIENT SURVEYS:  FOTO 44   EDEMA: Pocket of edema in distal medial arm, bruising in proximal biceps bruising and edema noted dressing in place  POSTURE: Rounded shoulders and forward head position  UPPER EXTREMITY ROM:   will assess next visit (< 1 week-- focused on elbow, wrist, positioning today) Passive ROM Right eval Left 12/13  Shoulder flexion  75  Shoulder extension    Shoulder abduction    Shoulder adduction    Shoulder internal rotation    Shoulder external rotation  0  Elbow flexion    Elbow extension    Wrist flexion    Wrist extension    Wrist ulnar deviation    Wrist radial deviation    Wrist pronation    Wrist supination    (Blank rows = not tested)  UPPER EXTREMITY MMT:  wrist flexion/extension grip strength 35 L and 60 R UNABLE-- will assess once protocol allows MMT Right eval Left eval  Shoulder flexion    Shoulder extension    Shoulder abduction    Shoulder adduction    Shoulder internal rotation    Shoulder external rotation    Middle trapezius    Lower trapezius    Elbow flexion    Elbow extension    Wrist flexion    Wrist extension    Wrist ulnar deviation    Wrist radial deviation    Wrist pronation    Wrist  supination    Grip strength (lbs)    (Blank rows = not tested)   [facility] Adult PT Treatment:                                                DATE: 10/21/23 Therapeutic Exercise: Pendulums x 2 minutes Scap squeeze x 15 Shoulder flexion AAROM on physioball Shoulder AAROM ER on physioball  Supine AAROM shoulder flexion to 90 with dowel Manual Therapy: PROM Lt shoulder per protocol  Modalities: Vaso 10 min Lt shoudler low pressure 34 degrees   [facility] Adult PT Treatment:                                                DATE: 10/17/23 Therapeutic Exercise: Pendulums all directions x 1 minute Scap squeeze x 15 Elbow flex/ext AROM x 15 Manual Therapy: PROM Lt shoulder ER and flexion per protocol in scapular plane  Therapeutic Activity: FOTO 44  Modalities: Vaso Lt shoulder x 10 min low pressure 34 degrees   PATIENT EDUCATION: Education details: HEP Person educated: Patient and spouse Education method: Programmer, multimedia, Facilities manager, and Handouts Education comprehension: verbalized understanding, returned demonstration, and needs further education  HOME EXERCISE PROGRAM: Access Code: [email or web address] URL: [email or web address] Date: 10/21/2023 Prepared by: Reggy Eye  Exercises - Seated Cervical Rotation AROM  - 2 x daily - 7 x weekly - 1 sets - 10 reps - Seated Cervical Extension AROM  - 2 x daily - 7 x weekly - 1 sets - 10 reps - Wrist AROM Flexion Extension  - 2  x daily - 7 x weekly - 1 sets - 10 reps - seated ball squeeze  - 2 x daily - 7 x weekly - 1 sets - 10 reps - Seated Scapular Retraction  - 1 x daily - 7 x weekly - 3 sets - 10 reps - Circular Shoulder Pendulum with Table Support  - 1 x daily - 7 x weekly - 3 sets - 10 reps - Flexion-Extension Shoulder Pendulum with Table Support  - 1 x daily - 7 x weekly - 3 sets - 10 reps - Horizontal Shoulder Pendulum with Table Support  - 1 x daily - 7 x weekly - 3 sets - 10 reps - Standing Single Arm Shoulder Flexion Towel  Slide at Table Top  - 1 x daily - 7 x weekly - 3 sets - 10 reps  ASSESSMENT:  CLINICAL IMPRESSION: Added AAROM to pt tolerance. No increased pain during session. He is progressing PROM and is reaching limits of protocol  OBJECTIVE IMPAIRMENTS: decreased activity tolerance, decreased ROM, decreased strength, increased edema, increased fascial restrictions, increased muscle spasms, impaired flexibility, impaired tone, postural dysfunction, and pain.     GOALS: Goals reviewed with patient? Yes  SHORT TERM GOALS: Target date: 11/13/23  The patient will be indep with HEP. Baseline: initiated at eval Goal status: INITIAL  2.  The patient will have PROM documented. Baseline:  began <1 week with elbow and wrist ROM + grip and neck ROM Goal status: MET   LONG TERM GOALS: Target date: 12/13/23  The patient will be indep with HEP. Baseline:  initiated at eval Goal status: INITIAL  2.  The patient will improve ROM to protocol ranges of flexion to 110-120, ER 30-45. Baseline: not assessed at eval Goal status: INITIAL  3.  The patient will tolerate isometrics below shoulder level. Baseline:  not assessed at eval Goal status: INITIAL  4.  The patient will reach into shoulder flexion/scaption to 90 degrees to demo improved strength against gravity. Baseline: not assessed at eval. Goal status: INITIAL  5.  The patient will reduce pain to < or equal to 2/10. Baseline:  5-9/10 Goal status: INITIAL 6.  Pt will improve FOTO to >= 74 Baseline:  Goal status: INITIAL    PLAN:  PT FREQUENCY: 1-2x/week  PT DURATION: 8 weeks  PLANNED INTERVENTIONS: 97164- PT Re-evaluation, 97110-Therapeutic exercises, 97530- Therapeutic activity, 97112- Neuromuscular re-education, 97535- Self Care, 19147- Manual therapy, 97014- Electrical stimulation (unattended), 97016- Vasopneumatic device, Patient/Family education, Taping, Dry Needling, Joint mobilization, Cryotherapy, and Moist heat  PLAN FOR NEXT  SESSION: progress per protocol    Jyron Turman, PT 10/21/2023, 10:45 AM

## 2023-10-24 ENCOUNTER — Ambulatory Visit: Payer: Medicare HMO | Admitting: Physical Therapy

## 2023-10-24 ENCOUNTER — Encounter: Payer: Self-pay | Admitting: Physical Therapy

## 2023-10-24 DIAGNOSIS — R600 Localized edema: Secondary | ICD-10-CM

## 2023-10-24 DIAGNOSIS — M25512 Pain in left shoulder: Secondary | ICD-10-CM

## 2023-10-24 DIAGNOSIS — Z96612 Presence of left artificial shoulder joint: Secondary | ICD-10-CM | POA: Diagnosis not present

## 2023-10-24 DIAGNOSIS — M6281 Muscle weakness (generalized): Secondary | ICD-10-CM | POA: Diagnosis not present

## 2023-10-24 DIAGNOSIS — R293 Abnormal posture: Secondary | ICD-10-CM | POA: Diagnosis not present

## 2023-10-24 DIAGNOSIS — Z5189 Encounter for other specified aftercare: Secondary | ICD-10-CM | POA: Diagnosis not present

## 2023-10-24 DIAGNOSIS — G8929 Other chronic pain: Secondary | ICD-10-CM | POA: Diagnosis not present

## 2023-10-24 NOTE — Therapy (Signed)
OUTPATIENT PHYSICAL THERAPY SHOULDER TREATMENT   Patient Name: Joshua [name] [facility] III MRN: [id number] DOB:[birth year], 77 y.o., male Today's Date: 10/24/2023  END OF SESSION:  PT End of Session - 10/24/23 1001     Visit Number 4    Number of Visits 16    Date for PT Re-Evaluation 12/12/23    Authorization Type [employer] medicare    Authorization - Visit Number 4    Progress Note Due on Visit 10    PT Start Time 0930    PT Stop Time 1010    PT Time Calculation (min) 40 min    Activity Tolerance Patient tolerated treatment well    Behavior During Therapy [facility] for tasks assessed/performed                Past Medical History:  Diagnosis Date   Abnormal findings on diagnostic imaging of lung 09/23/2019   09/22/2019-lung cancer screening-centrilobular and paraseptal emphysema, calcified granulomata noted bilaterally, no suspicious nodules, lung RADS 1, repeat in 12 months    Anxiety    Atrial fibrillation (HCC) 09/23/2019   Centrilobular emphysema (HCC) 09/23/2019   09/22/2019-lung cancer screening-centrilobular and paraseptal emphysema, calcified granulomata noted bilaterally, no suspicious nodules, lung RADS 1, repeat in 12 months  09/23/2019-FVC 3.74 (81% predicted), postbronchodilator ratio 80, postbronchodilator FEV1 2.88 (86% predicted), no bronchodilator response, DLCO 19.44 (73% predicted)   DDD (degenerative disc disease), cervical 05/13/2008   Cervical Disc Degeneration   Depression    ETOH abuse 10/01/2023   continues to drink 1-3 drinks/ day   History of adenomatous polyp of colon 03/03/2018   05/14/17: Colonoscopy: nonadvanced adenoma, f/u 5 yrs, use SuPrep, Murphy/GAP  Last Assessment & Plan:  Relevant Hx: Course: Daily Update: Today's Plan:   History of kidney stones    Hyperlipidemia    Kidney stone    Nephrolithiasis 05/22/2011   Nontraumatic incomplete tear of right rotator cuff 10/14/2018   Other and unspecified hyperlipidemia 11/20/2009   Hyperlipidemia    RBBB 06/09/2012   Secondary hypercoagulable state (HCC) 09/28/2019   Shortness of breath 09/23/2019   Past Surgical History:  Procedure Laterality Date   CARDIOVERSION N/A 10/15/2019   Procedure: CARDIOVERSION;  Surgeon: Jake Bathe, MD;  Location: [facility] ENDOSCOPY;  Service: Cardiovascular;  Laterality: N/A;   CARDIOVERSION N/A 11/29/2019   Procedure: CARDIOVERSION;  Surgeon: Lars Masson, MD;  Location: [facility] ENDOSCOPY;  Service: Cardiovascular;  Laterality: N/A;   HEMORRHOID SURGERY     REVERSE SHOULDER ARTHROPLASTY Left 10/09/2023   Procedure: REVERSE SHOULDER ARTHROPLASTY;  Surgeon: Bjorn Pippin, MD;  Location: [facility];  Service: Orthopedics;  Laterality: Left;   TOTAL HIP ARTHROPLASTY Right 12/21/2021   Procedure: TOTAL HIP ARTHROPLASTY ANTERIOR APPROACH;  Surgeon: Jodi Geralds, MD;  Location: [facility];  Service: Orthopedics;  Laterality: Right;   VARICOSE VEIN SURGERY Right    Patient Active Problem List   Diagnosis Date Noted   Alcohol-induced mood disorder with depressive symptoms (HCC) 06/10/2023   Benign prostatic hyperplasia without lower urinary tract symptoms 06/05/2023   Dyslipidemia 06/05/2023   Fall 06/05/2023   History of peripheral neuropathy 06/05/2023   Subdural hematoma (HCC) 06/04/2023   Pain in finger of left hand 03/12/2023   Alcohol abuse 05/31/2022   Disorder of thoracic spine 05/31/2022   COVID-19 virus infection 03/04/2022   Acute bronchitis 03/04/2022   OSA (obstructive sleep apnea) 03/04/2022   PAF (paroxysmal atrial fibrillation) (HCC) 12/13/2021   Anxiety 11/08/2021   Aphasia 11/08/2021   Bipolar  1 disorder (HCC) 11/08/2021   CKD (chronic kidney disease), stage III (HCC) 11/08/2021   Expressive aphasia 11/08/2021   Prolonged QT interval 11/08/2021   Recurrent falls 11/08/2021   Primary osteoarthritis of right hip 10/26/2021   Abnormal liver function tests 09/10/2021   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   Urinary hesitancy 03/08/2021    Thoracic aortic aneurysm without rupture (HCC) 03/08/2021   Senile purpura (HCC) 09/07/2020   Aortic atherosclerosis (HCC) 09/07/2020   Sinus bradycardia 03/31/2020   Essential hypertension 03/31/2020   Major depressive disorder 03/07/2020   Essential tremor 03/07/2020   Tremor 01/21/2020   Obesity (BMI 30-39.9) 12/10/2019   Chest pain of uncertain etiology 12/10/2019   Secondary hypercoagulable state (HCC) 09/28/2019   Centrilobular emphysema (HCC) 09/23/2019   Former smoker 09/23/2019   Abnormal findings on diagnostic imaging of lung 09/23/2019   Atrial fibrillation (HCC) 09/23/2019   At risk for sleep apnea 09/23/2019   Shortness of breath 09/23/2019   Nontraumatic incomplete tear of right rotator cuff 10/14/2018   History of adenomatous polyp of colon 03/03/2018   RBBB 06/09/2012   Nephrolithiasis 05/22/2011   Mixed hyperlipidemia 11/20/2009   DDD (degenerative disc disease), cervical 05/13/2008    PCP: Everrett Coombe, DO REFERRING PROVIDER: Ramond Marrow, MD REFERRING DIAG: [phone] (ICD-10-CM) - Status post reverse total arthroplasty of left shoulder  THERAPY DIAG:  Acute pain of left shoulder  Muscle weakness (generalized)  Localized edema  Rationale for Evaluation and Treatment: Rehabilitation  ONSET DATE: 10/09/23 DATE OF SURGERY  SUBJECTIVE:                                                                                                                                                                                     SUBJECTIVE STATEMENT: Pt states he feel "about the same". He states he is able to get the sling on/off by himself Hand dominance: Right  PERTINENT HISTORY: H/o a-fib, loop recorder, ETOH use, depression  PAIN:  Are you having pain? Yes: NPRS scale: 4/10 Pain location: biceps region Pain description: shoulder is not painful at all-- arm musculature is painful Aggravating factors: sitting/sleeping Relieving factors: medicine  PRECAUTIONS:  Shoulder and Other: reverse total shoulder replacement  to follow protocol for reverse total shoulder  WEIGHT BEARING RESTRICTIONS: Yes < 1 lb per protocol  FALLS:  Has patient fallen in last 6 months? Yes. Number of falls 2x in the past 6 months. He has a h/o drinking and felt like gabapentin + alcohol led to falls.    LIVING ENVIRONMENT: Lives with: lives with their spouse Lives in: House/apartment Stairs:  2 to garage and 2 to deck in back  OCCUPATION: Retired from [employer] pilot;  enjoys repairing things  PLOF: Independent  PATIENT GOALS:reduced pain and improve use of the arm "as close to normal as I can get"  NEXT MD VISIT:  10/16/23 follow up visit  OBJECTIVE:  Note: Objective measures were completed at Evaluation unless otherwise noted.  PATIENT SURVEYS:  FOTO 44   EDEMA: Pocket of edema in distal medial arm, bruising in proximal biceps bruising and edema noted dressing in place  POSTURE: Rounded shoulders and forward head position  UPPER EXTREMITY ROM:   will assess next visit (< 1 week-- focused on elbow, wrist, positioning today) Passive ROM Right eval Left 12/13  Shoulder flexion  75  Shoulder extension    Shoulder abduction    Shoulder adduction    Shoulder internal rotation    Shoulder external rotation  0  Elbow flexion    Elbow extension    Wrist flexion    Wrist extension    Wrist ulnar deviation    Wrist radial deviation    Wrist pronation    Wrist supination    (Blank rows = not tested)  UPPER EXTREMITY MMT:  wrist flexion/extension grip strength 35 L and 60 R UNABLE-- will assess once protocol allows MMT Right eval Left eval  Shoulder flexion    Shoulder extension    Shoulder abduction    Shoulder adduction    Shoulder internal rotation    Shoulder external rotation    Middle trapezius    Lower trapezius    Elbow flexion    Elbow extension    Wrist flexion    Wrist extension    Wrist ulnar deviation    Wrist radial deviation    Wrist  pronation    Wrist supination    Grip strength (lbs)    (Blank rows = not tested)   [facility] Adult PT Treatment:                                                DATE: 10/24/23 Therapeutic Exercise: Pendulums x 2 mins Shoulder flexion AAROM on physioball Shoulder AAROM ER on physioball  Shoulder AAROM ER with dowel Scap squeeze x 15 Supine flexion AAROM with dowel Manual Therapy: PROM Lt shoulder per protocol STM Lt shoulder musculature  Modalities: Vaso 10 min Lt shoudler low pressure 34 degrees   [facility] Adult PT Treatment:                                                DATE: 10/21/23 Therapeutic Exercise: Pendulums x 2 minutes Scap squeeze x 15 Shoulder flexion AAROM on physioball Shoulder AAROM ER on physioball  Supine AAROM shoulder flexion to 90 with dowel Manual Therapy: PROM Lt shoulder per protocol  Modalities: Vaso 10 min Lt shoudler low pressure 34 degrees   [facility] Adult PT Treatment:                                                DATE: 10/17/23 Therapeutic Exercise: Pendulums all directions x 1 minute Scap squeeze x 15 Elbow flex/ext AROM x 15 Manual Therapy: PROM Lt shoulder ER and flexion per protocol  in scapular plane  Therapeutic Activity: FOTO 44  Modalities: Vaso Lt shoulder x 10 min low pressure 34 degrees   PATIENT EDUCATION: Education details: HEP Person educated: Patient and spouse Education method: Explanation, Demonstration, and Handouts Education comprehension: verbalized understanding, returned demonstration, and needs further education  HOME EXERCISE PROGRAM: Access Code: [email or web address] URL: [email or web address] Date: 10/21/2023 Prepared by: Reggy Eye  Exercises - Seated Cervical Rotation AROM  - 2 x daily - 7 x weekly - 1 sets - 10 reps - Seated Cervical Extension AROM  - 2 x daily - 7 x weekly - 1 sets - 10 reps - Wrist AROM Flexion Extension  - 2 x daily - 7 x weekly - 1 sets - 10 reps - seated ball squeeze  - 2 x  daily - 7 x weekly - 1 sets - 10 reps - Seated Scapular Retraction  - 1 x daily - 7 x weekly - 3 sets - 10 reps - Circular Shoulder Pendulum with Table Support  - 1 x daily - 7 x weekly - 3 sets - 10 reps - Flexion-Extension Shoulder Pendulum with Table Support  - 1 x daily - 7 x weekly - 3 sets - 10 reps - Horizontal Shoulder Pendulum with Table Support  - 1 x daily - 7 x weekly - 3 sets - 10 reps - Standing Single Arm Shoulder Flexion Towel Slide at Table Top  - 1 x daily - 7 x weekly - 3 sets - 10 reps  ASSESSMENT:  CLINICAL IMPRESSION: Pt is progressing well within limits of protocol. He has achieved 90 degrees flexion, still has slight limitation in ER  OBJECTIVE IMPAIRMENTS: decreased activity tolerance, decreased ROM, decreased strength, increased edema, increased fascial restrictions, increased muscle spasms, impaired flexibility, impaired tone, postural dysfunction, and pain.     GOALS: Goals reviewed with patient? Yes  SHORT TERM GOALS: Target date: 11/13/23  The patient will be indep with HEP. Baseline: initiated at eval Goal status: INITIAL  2.  The patient will have PROM documented. Baseline:  began <1 week with elbow and wrist ROM + grip and neck ROM Goal status: MET   LONG TERM GOALS: Target date: 12/13/23  The patient will be indep with HEP. Baseline:  initiated at eval Goal status: INITIAL  2.  The patient will improve ROM to protocol ranges of flexion to 110-120, ER 30-45. Baseline: not assessed at eval Goal status: INITIAL  3.  The patient will tolerate isometrics below shoulder level. Baseline:  not assessed at eval Goal status: INITIAL  4.  The patient will reach into shoulder flexion/scaption to 90 degrees to demo improved strength against gravity. Baseline: not assessed at eval. Goal status: INITIAL  5.  The patient will reduce pain to < or equal to 2/10. Baseline:  5-9/10 Goal status: INITIAL 6.  Pt will improve FOTO to >= 74 Baseline:  Goal  status: INITIAL    PLAN:  PT FREQUENCY: 1-2x/week  PT DURATION: 8 weeks  PLANNED INTERVENTIONS: 97164- PT Re-evaluation, 97110-Therapeutic exercises, 97530- Therapeutic activity, 97112- Neuromuscular re-education, 97535- Self Care, [id number]- Manual therapy, 97014- Electrical stimulation (unattended), 97016- Vasopneumatic device, Patient/Family education, Taping, Dry Needling, Joint mobilization, Cryotherapy, and Moist heat  PLAN FOR NEXT SESSION: progress per protocol    Asael Pann, PT 10/24/2023, 10:02 AM

## 2023-10-27 ENCOUNTER — Ambulatory Visit: Payer: Medicare HMO

## 2023-10-27 DIAGNOSIS — M25512 Pain in left shoulder: Secondary | ICD-10-CM | POA: Diagnosis not present

## 2023-10-27 DIAGNOSIS — R600 Localized edema: Secondary | ICD-10-CM

## 2023-10-27 DIAGNOSIS — Z96612 Presence of left artificial shoulder joint: Secondary | ICD-10-CM | POA: Diagnosis not present

## 2023-10-27 DIAGNOSIS — R293 Abnormal posture: Secondary | ICD-10-CM | POA: Diagnosis not present

## 2023-10-27 DIAGNOSIS — M6281 Muscle weakness (generalized): Secondary | ICD-10-CM | POA: Diagnosis not present

## 2023-10-27 DIAGNOSIS — Z5189 Encounter for other specified aftercare: Secondary | ICD-10-CM | POA: Diagnosis not present

## 2023-10-27 DIAGNOSIS — G8929 Other chronic pain: Secondary | ICD-10-CM | POA: Diagnosis not present

## 2023-10-27 NOTE — Therapy (Signed)
OUTPATIENT PHYSICAL THERAPY SHOULDER TREATMENT   Patient Name: Joshua [name] [facility] MRN: [id number] DOB:May 17, 1946, 77 y.o., male Today's Date: 10/27/2023  END OF SESSION:  PT End of Session - 10/27/23 1405     Visit Number 5    Number of Visits 16    Date for PT Re-Evaluation 12/12/23    Authorization Type [employer] medicare    Progress Note Due on Visit 10    PT Start Time 1405    PT Stop Time 1453    PT Time Calculation (min) 48 min    Activity Tolerance Patient tolerated treatment well    Behavior During Therapy [facility] for tasks assessed/performed            Past Medical History:  Diagnosis Date   Abnormal findings on diagnostic imaging of lung 09/23/2019   09/22/2019-lung cancer screening-centrilobular and paraseptal emphysema, calcified granulomata noted bilaterally, no suspicious nodules, lung RADS 1, repeat in 12 months    Anxiety    Atrial fibrillation (HCC) 09/23/2019   Centrilobular emphysema (HCC) 09/23/2019   09/22/2019-lung cancer screening-centrilobular and paraseptal emphysema, calcified granulomata noted bilaterally, no suspicious nodules, lung RADS 1, repeat in 12 months  09/23/2019-FVC 3.74 (81% predicted), postbronchodilator ratio 80, postbronchodilator FEV1 2.88 (86% predicted), no bronchodilator response, DLCO 19.44 (73% predicted)   DDD (degenerative disc disease), cervical 05/13/2008   Cervical Disc Degeneration   Depression    ETOH abuse 10/01/2023   continues to drink 1-3 drinks/ day   History of adenomatous polyp of colon 03/03/2018   05/14/17: Colonoscopy: nonadvanced adenoma, f/u 5 yrs, use SuPrep, Murphy/GAP  Last Assessment & Plan:  Relevant Hx: Course: Daily Update: Today's Plan:   History of kidney stones    Hyperlipidemia    Kidney stone    Nephrolithiasis 05/22/2011   Nontraumatic incomplete tear of right rotator cuff 10/14/2018   Other and unspecified hyperlipidemia 11/20/2009   Hyperlipidemia   RBBB 06/09/2012   Secondary hypercoagulable  state (HCC) 09/28/2019   Shortness of breath 09/23/2019   Past Surgical History:  Procedure Laterality Date   CARDIOVERSION N/A 10/15/2019   Procedure: CARDIOVERSION;  Surgeon: Jake Bathe, MD;  Location: [facility] ENDOSCOPY;  Service: Cardiovascular;  Laterality: N/A;   CARDIOVERSION N/A 11/29/2019   Procedure: CARDIOVERSION;  Surgeon: Lars Masson, MD;  Location: [facility] ENDOSCOPY;  Service: Cardiovascular;  Laterality: N/A;   HEMORRHOID SURGERY     REVERSE SHOULDER ARTHROPLASTY Left 10/09/2023   Procedure: REVERSE SHOULDER ARTHROPLASTY;  Surgeon: Bjorn Pippin, MD;  Location: [facility];  Service: Orthopedics;  Laterality: Left;   TOTAL HIP ARTHROPLASTY Right 12/21/2021   Procedure: TOTAL HIP ARTHROPLASTY ANTERIOR APPROACH;  Surgeon: Jodi Geralds, MD;  Location: [facility] ORS;  Service: Orthopedics;  Laterality: Right;   VARICOSE VEIN SURGERY Right    Patient Active Problem List   Diagnosis Date Noted   Alcohol-induced mood disorder with depressive symptoms (HCC) 06/10/2023   Benign prostatic hyperplasia without lower urinary tract symptoms 06/05/2023   Dyslipidemia 06/05/2023   Fall 06/05/2023   History of peripheral neuropathy 06/05/2023   Subdural hematoma (HCC) 06/04/2023   Pain in finger of left hand 03/12/2023   Alcohol abuse 05/31/2022   Disorder of thoracic spine 05/31/2022   COVID-19 virus infection 03/04/2022   Acute bronchitis 03/04/2022   OSA (obstructive sleep apnea) 03/04/2022   PAF (paroxysmal atrial fibrillation) (HCC) 12/13/2021   Anxiety 11/08/2021   Aphasia 11/08/2021   Bipolar 1 disorder (HCC) 11/08/2021   CKD (chronic kidney disease), stage III (  HCC) 11/08/2021   Expressive aphasia 11/08/2021   Prolonged QT interval 11/08/2021   Recurrent falls 11/08/2021   Primary osteoarthritis of right hip 10/26/2021   Abnormal liver function tests 09/10/2021   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   Urinary hesitancy 03/08/2021   Thoracic aortic aneurysm without rupture (HCC)  03/08/2021   Senile purpura (HCC) 09/07/2020   Aortic atherosclerosis (HCC) 09/07/2020   Sinus bradycardia 03/31/2020   Essential hypertension 03/31/2020   Major depressive disorder 03/07/2020   Essential tremor 03/07/2020   Tremor 01/21/2020   Obesity (BMI 30-39.9) 12/10/2019   Chest pain of uncertain etiology 12/10/2019   Secondary hypercoagulable state (HCC) 09/28/2019   Centrilobular emphysema (HCC) 09/23/2019   Former smoker 09/23/2019   Abnormal findings on diagnostic imaging of lung 09/23/2019   Atrial fibrillation (HCC) 09/23/2019   At risk for sleep apnea 09/23/2019   Shortness of breath 09/23/2019   Nontraumatic incomplete tear of right rotator cuff 10/14/2018   History of adenomatous polyp of colon 03/03/2018   RBBB 06/09/2012   Nephrolithiasis 05/22/2011   Mixed hyperlipidemia 11/20/2009   DDD (degenerative disc disease), cervical 05/13/2008    PCP: Everrett Coombe, DO REFERRING PROVIDER: Ramond Marrow, MD REFERRING DIAG: [phone] (ICD-10-CM) - Status post reverse total arthroplasty of left shoulder  THERAPY DIAG:  Acute pain of left shoulder  Muscle weakness (generalized)  Localized edema  Abnormal posture  Rationale for Evaluation and Treatment: Rehabilitation  ONSET DATE: 10/09/23 DATE OF SURGERY  SUBJECTIVE:                                                                                                                                                                                     SUBJECTIVE STATEMENT: Patient reports soreness along upper outer arm. Hand dominance: Right  PERTINENT HISTORY: H/o a-fib, loop recorder, ETOH use, depression  PAIN:  Are you having pain? Yes: NPRS scale: 4/10 Pain location: biceps region Pain description: shoulder is not painful at all-- arm musculature is painful Aggravating factors: sitting/sleeping Relieving factors: medicine  PRECAUTIONS: Shoulder and Other: reverse total shoulder replacement  to follow protocol  for reverse total shoulder  WEIGHT BEARING RESTRICTIONS: Yes < 1 lb per protocol  FALLS:  Has patient fallen in last 6 months? Yes. Number of falls 2x in the past 6 months. He has a h/o drinking and felt like gabapentin + alcohol led to falls.    LIVING ENVIRONMENT: Lives with: lives with their spouse Lives in: House/apartment Stairs:  2 to garage and 2 to deck in back  OCCUPATION: Retired from [employer]; enjoys Chiropractor things  PLOF: Independent  PATIENT GOALS:reduced pain and improve use of the arm "as close to normal as  I can get"  NEXT MD VISIT:  11/06/23  OBJECTIVE:  Note: Objective measures were completed at Evaluation unless otherwise noted.  PATIENT SURVEYS:  FOTO 44   EDEMA: Pocket of edema in distal medial arm, bruising in proximal biceps bruising and edema noted dressing in place  POSTURE: Rounded shoulders and forward head position  UPPER EXTREMITY ROM:   will assess next visit (< 1 week-- focused on elbow, wrist, positioning today) Passive ROM Right eval Left 12/13  Shoulder flexion  75  Shoulder extension    Shoulder abduction    Shoulder adduction    Shoulder internal rotation    Shoulder external rotation  0  Elbow flexion    Elbow extension    Wrist flexion    Wrist extension    Wrist ulnar deviation    Wrist radial deviation    Wrist pronation    Wrist supination    (Blank rows = not tested)  UPPER EXTREMITY MMT:  wrist flexion/extension grip strength 35 L and 60 R UNABLE-- will assess once protocol allows MMT Right eval Left eval  Shoulder flexion    Shoulder extension    Shoulder abduction    Shoulder adduction    Shoulder internal rotation    Shoulder external rotation    Middle trapezius    Lower trapezius    Elbow flexion    Elbow extension    Wrist flexion    Wrist extension    Wrist ulnar deviation    Wrist radial deviation    Wrist pronation    Wrist supination    Grip strength (lbs)    (Blank rows = not  tested)    [facility] Adult PT Treatment:                                                DATE: 10/27/2023 Therapeutic Exercise: Pendulums  Standing: Shoulder flexion AAROM with red PB Shoulder AAROM ER with coregeous ball Supine: Shoulder AAROM chest press with dowel Shoulder AAROM flexion with dowel (90 deg) Shoulder PROM scaption (<90 deg) Shoulder AAROM ER with SPC  Scap squeezes  Manual Therapy: PROM Lt shoulder per protocol STM Lt shoulder musculature Scap MWM (pillowcase) Modalities: Vaso (L) shoulder, 34 deg, med compression x 10 min   [facility] Adult PT Treatment:                                                DATE: 10/24/23 Therapeutic Exercise: Pendulums x 2 mins Shoulder flexion AAROM on physioball Shoulder AAROM ER on physioball  Shoulder AAROM ER with dowel Scap squeeze x 15 Supine flexion AAROM with dowel Manual Therapy: PROM Lt shoulder per protocol STM Lt shoulder musculature Modalities: Vaso 10 min Lt shoudler low pressure 34 degrees   [facility] Adult PT Treatment:                                                DATE: 10/21/23 Therapeutic Exercise: Pendulums x 2 minutes Scap squeeze x 15 Shoulder flexion AAROM on physioball Shoulder AAROM ER on physioball  Supine AAROM shoulder flexion to 90 with  dowel Manual Therapy: PROM Lt shoulder per protocol  Modalities: Vaso 10 min Lt shoudler low pressure 34 degrees   PATIENT EDUCATION: Education details: HEP Person educated: Patient and spouse Education method: Explanation, Demonstration, and Handouts Education comprehension: verbalized understanding, returned demonstration, and needs further education  HOME EXERCISE PROGRAM: Access Code: [email or web address] URL: [email or web address] Date: 10/21/2023 Prepared by: Reggy Eye  Exercises - Seated Cervical Rotation AROM  - 2 x daily - 7 x weekly - 1 sets - 10 reps - Seated Cervical Extension AROM  - 2 x daily - 7 x weekly - 1 sets - 10 reps - Wrist AROM  Flexion Extension  - 2 x daily - 7 x weekly - 1 sets - 10 reps - seated ball squeeze  - 2 x daily - 7 x weekly - 1 sets - 10 reps - Seated Scapular Retraction  - 1 x daily - 7 x weekly - 3 sets - 10 reps - Circular Shoulder Pendulum with Table Support  - 1 x daily - 7 x weekly - 3 sets - 10 reps - Flexion-Extension Shoulder Pendulum with Table Support  - 1 x daily - 7 x weekly - 3 sets - 10 reps - Horizontal Shoulder Pendulum with Table Support  - 1 x daily - 7 x weekly - 3 sets - 10 reps - Standing Single Arm Shoulder Flexion Towel Slide at Table Top  - 1 x daily - 7 x weekly - 3 sets - 10 reps  ASSESSMENT:  CLINICAL IMPRESSION: Patient continues to feel most restriction with shoulder AAROM external rotation. Passive shoulder and active assist ROM exercises continued per protocol guidelines.   OBJECTIVE IMPAIRMENTS: decreased activity tolerance, decreased ROM, decreased strength, increased edema, increased fascial restrictions, increased muscle spasms, impaired flexibility, impaired tone, postural dysfunction, and pain.     GOALS: Goals reviewed with patient? Yes  SHORT TERM GOALS: Target date: 11/13/23  The patient will be indep with HEP. Baseline: initiated at eval Goal status: INITIAL  2.  The patient will have PROM documented. Baseline:  began <1 week with elbow and wrist ROM + grip and neck ROM Goal status: MET   LONG TERM GOALS: Target date: 12/13/23  The patient will be indep with HEP. Baseline:  initiated at eval Goal status: INITIAL  2.  The patient will improve ROM to protocol ranges of flexion to 110-120, ER 30-45. Baseline: not assessed at eval Goal status: INITIAL  3.  The patient will tolerate isometrics below shoulder level. Baseline:  not assessed at eval Goal status: INITIAL  4.  The patient will reach into shoulder flexion/scaption to 90 degrees to demo improved strength against gravity. Baseline: not assessed at eval. Goal status: INITIAL  5.  The  patient will reduce pain to < or equal to 2/10. Baseline:  5-9/10 Goal status: INITIAL 6.  Pt will improve FOTO to >= 74 Baseline:  Goal status: INITIAL    PLAN:  PT FREQUENCY: 1-2x/week  PT DURATION: 8 weeks  PLANNED INTERVENTIONS: 97164- PT Re-evaluation, 97110-Therapeutic exercises, 97530- Therapeutic activity, 97112- Neuromuscular re-education, 97535- Self Care, 86578- Manual therapy, 97014- Electrical stimulation (unattended), 97016- Vasopneumatic device, Patient/Family education, Taping, Dry Needling, Joint mobilization, Cryotherapy, and Moist heat  PLAN FOR NEXT SESSION: progress per protocol    Sanjuana Mae, PTA 10/27/2023, 2:44 PM

## 2023-10-30 ENCOUNTER — Ambulatory Visit: Payer: Medicare HMO

## 2023-10-30 DIAGNOSIS — Z96612 Presence of left artificial shoulder joint: Secondary | ICD-10-CM | POA: Diagnosis not present

## 2023-10-30 DIAGNOSIS — M6281 Muscle weakness (generalized): Secondary | ICD-10-CM | POA: Diagnosis not present

## 2023-10-30 DIAGNOSIS — M25512 Pain in left shoulder: Secondary | ICD-10-CM

## 2023-10-30 DIAGNOSIS — R293 Abnormal posture: Secondary | ICD-10-CM

## 2023-10-30 DIAGNOSIS — Z5189 Encounter for other specified aftercare: Secondary | ICD-10-CM | POA: Diagnosis not present

## 2023-10-30 DIAGNOSIS — R600 Localized edema: Secondary | ICD-10-CM | POA: Diagnosis not present

## 2023-10-30 DIAGNOSIS — G8929 Other chronic pain: Secondary | ICD-10-CM | POA: Diagnosis not present

## 2023-10-30 NOTE — Therapy (Signed)
OUTPATIENT PHYSICAL THERAPY SHOULDER TREATMENT   Patient Name: Joshua [name] MRN: [id number] DOB:[birth year], 77 y.o., male Today's Date: 10/30/2023  END OF SESSION:  PT End of Session - 10/30/23 1456     Visit Number 6    Number of Visits 16    Date for PT Re-Evaluation 12/12/23    Authorization Type [employer] medicare    Authorization - Visit Number 6    Progress Note Due on Visit 10    PT Start Time 1152    PT Stop Time 1235    PT Time Calculation (min) 43 min    Activity Tolerance Patient tolerated treatment well    Behavior During Therapy WFL for tasks assessed/performed             Past Medical History:  Diagnosis Date   Abnormal findings on diagnostic imaging of lung 09/23/2019   09/22/2019-lung cancer screening-centrilobular and paraseptal emphysema, calcified granulomata noted bilaterally, no suspicious nodules, lung RADS 1, repeat in 12 months    Anxiety    Atrial fibrillation (HCC) 09/23/2019   Centrilobular emphysema (HCC) 09/23/2019   09/22/2019-lung cancer screening-centrilobular and paraseptal emphysema, calcified granulomata noted bilaterally, no suspicious nodules, lung RADS 1, repeat in 12 months  09/23/2019-FVC 3.74 (81% predicted), postbronchodilator ratio 80, postbronchodilator FEV1 2.88 (86% predicted), no bronchodilator response, DLCO 19.44 (73% predicted)   DDD (degenerative disc disease), cervical 05/13/2008   Cervical Disc Degeneration   Depression    ETOH abuse 10/01/2023   continues to drink 1-3 drinks/ day   History of adenomatous polyp of colon 03/03/2018   05/14/17: Colonoscopy: nonadvanced adenoma, f/u 5 yrs, use SuPrep, Murphy/GAP  Last Assessment & Plan:  Relevant Hx: Course: Daily Update: Today's Plan:   History of kidney stones    Hyperlipidemia    Kidney stone    Nephrolithiasis 05/22/2011   Nontraumatic incomplete tear of right rotator cuff 10/14/2018   Other and unspecified hyperlipidemia 11/20/2009   Hyperlipidemia   RBBB  06/09/2012   Secondary hypercoagulable state (HCC) 09/28/2019   Shortness of breath 09/23/2019   Past Surgical History:  Procedure Laterality Date   CARDIOVERSION N/A 10/15/2019   Procedure: CARDIOVERSION;  Surgeon: Jake Bathe, MD;  Location: [facility] ENDOSCOPY;  Service: Cardiovascular;  Laterality: N/A;   CARDIOVERSION N/A 11/29/2019   Procedure: CARDIOVERSION;  Surgeon: Lars Masson, MD;  Location: [facility] ENDOSCOPY;  Service: Cardiovascular;  Laterality: N/A;   HEMORRHOID SURGERY     REVERSE SHOULDER ARTHROPLASTY Left 10/09/2023   Procedure: REVERSE SHOULDER ARTHROPLASTY;  Surgeon: Bjorn Pippin, MD;  Location: [facility];  Service: Orthopedics;  Laterality: Left;   TOTAL HIP ARTHROPLASTY Right 12/21/2021   Procedure: TOTAL HIP ARTHROPLASTY ANTERIOR APPROACH;  Surgeon: Jodi Geralds, MD;  Location: [facility] ORS;  Service: Orthopedics;  Laterality: Right;   VARICOSE VEIN SURGERY Right    Patient Active Problem List   Diagnosis Date Noted   Alcohol-induced mood disorder with depressive symptoms (HCC) 06/10/2023   Benign prostatic hyperplasia without lower urinary tract symptoms 06/05/2023   Dyslipidemia 06/05/2023   Fall 06/05/2023   History of peripheral neuropathy 06/05/2023   Subdural hematoma (HCC) 06/04/2023   Pain in finger of left hand 03/12/2023   Alcohol abuse 05/31/2022   Disorder of thoracic spine 05/31/2022   COVID-19 virus infection 03/04/2022   Acute bronchitis 03/04/2022   OSA (obstructive sleep apnea) 03/04/2022   PAF (paroxysmal atrial fibrillation) (HCC) 12/13/2021   Anxiety 11/08/2021   Aphasia 11/08/2021   Bipolar 1 disorder (HCC)  11/08/2021   CKD (chronic kidney disease), stage [name] (HCC) 11/08/2021   Expressive aphasia 11/08/2021   Prolonged QT interval 11/08/2021   Recurrent falls 11/08/2021   Primary osteoarthritis of right hip 10/26/2021   Abnormal liver function tests 09/10/2021   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   Urinary hesitancy 03/08/2021   Thoracic  aortic aneurysm without rupture (HCC) 03/08/2021   Senile purpura (HCC) 09/07/2020   Aortic atherosclerosis (HCC) 09/07/2020   Sinus bradycardia 03/31/2020   Essential hypertension 03/31/2020   Major depressive disorder 03/07/2020   Essential tremor 03/07/2020   Tremor 01/21/2020   Obesity (BMI 30-39.9) 12/10/2019   Chest pain of uncertain etiology 12/10/2019   Secondary hypercoagulable state (HCC) 09/28/2019   Centrilobular emphysema (HCC) 09/23/2019   Former smoker 09/23/2019   Abnormal findings on diagnostic imaging of lung 09/23/2019   Atrial fibrillation (HCC) 09/23/2019   At risk for sleep apnea 09/23/2019   Shortness of breath 09/23/2019   Nontraumatic incomplete tear of right rotator cuff 10/14/2018   History of adenomatous polyp of colon 03/03/2018   RBBB 06/09/2012   Nephrolithiasis 05/22/2011   Mixed hyperlipidemia 11/20/2009   DDD (degenerative disc disease), cervical 05/13/2008    PCP: Everrett Coombe, DO REFERRING PROVIDER: Ramond Marrow, MD REFERRING DIAG: [phone] (ICD-10-CM) - Status post reverse total arthroplasty of left shoulder  THERAPY DIAG:  Acute pain of left shoulder  Muscle weakness (generalized)  Localized edema  Abnormal posture  Rationale for Evaluation and Treatment: Rehabilitation  ONSET DATE: 10/09/23 DATE OF SURGERY  SUBJECTIVE:                                                                                                                                                                                     SUBJECTIVE STATEMENT: Patient returned to the clinic stating that overall the L shoulder is recovering as expected. He still has some pain, particularly at the lateral aspect of his L shoulder, but not more than expected. He stated that the shoulder mobilization performed last session with the pillowcase really seemed to help. Otherwise, he has been working on his home program and did not describe any issues with the exercises.   Hand  dominance: Right  PERTINENT HISTORY: H/o a-fib, loop recorder, ETOH use, depression  PAIN:  Are you having pain? Yes: NPRS scale: 4/10 Pain location: biceps region Pain description: shoulder is not painful at all-- arm musculature is painful Aggravating factors: sitting/sleeping Relieving factors: medicine  PRECAUTIONS: Shoulder and Other: reverse total shoulder replacement  to follow protocol for reverse total shoulder  WEIGHT BEARING RESTRICTIONS: Yes < 1 lb per protocol  FALLS:  Has patient fallen in last 6 months? Yes. Number of  falls 2x in the past 6 months. He has a h/o drinking and felt like gabapentin + alcohol led to falls.    LIVING ENVIRONMENT: Lives with: lives with their spouse Lives in: House/apartment Stairs:  2 to garage and 2 to deck in back  OCCUPATION: Retired from [employer]; enjoys Chiropractor things  PLOF: Independent  PATIENT GOALS:reduced pain and improve use of the arm "as close to normal as I can get"  NEXT MD VISIT:  11/06/23  OBJECTIVE:  Note: Objective measures were completed at Evaluation unless otherwise noted.  PATIENT SURVEYS:  FOTO 44   EDEMA: Pocket of edema in distal medial arm, bruising in proximal biceps bruising and edema noted dressing in place  POSTURE: Rounded shoulders and forward head position  UPPER EXTREMITY ROM:   will assess next visit (< 1 week-- focused on elbow, wrist, positioning today) Passive ROM Right eval Left 12/13  Shoulder flexion  75  Shoulder extension    Shoulder abduction    Shoulder adduction    Shoulder internal rotation    Shoulder external rotation  0  Elbow flexion    Elbow extension    Wrist flexion    Wrist extension    Wrist ulnar deviation    Wrist radial deviation    Wrist pronation    Wrist supination    (Blank rows = not tested)  UPPER EXTREMITY MMT:  wrist flexion/extension grip strength 35 L and 60 R UNABLE-- will assess once protocol allows MMT Right eval Left eval  Shoulder  flexion    Shoulder extension    Shoulder abduction    Shoulder adduction    Shoulder internal rotation    Shoulder external rotation    Middle trapezius    Lower trapezius    Elbow flexion    Elbow extension    Wrist flexion    Wrist extension    Wrist ulnar deviation    Wrist radial deviation    Wrist pronation    Wrist supination    Grip strength (lbs)    (Blank rows = not tested)  [facility] Adult PT Treatment:                                                DATE: 10/30/2023 Therapeutic Exercise: Supine: Shoulder PROM flexion, external rotation, abduction, horizontal adduction, horizontal abduction, within protocol restrictions Manual Therapy: Supine: Soft tissue mobilization L biceps (pin and stretch), L anterior and middle deltoid, L upper trapezius, L levator scapulae, L midscapular musculature Scapular mobilization with movement with pillowcase during AAROM into shoulder flexion with dowel  [facility] Adult PT Treatment:                                                DATE: 10/27/2023 Therapeutic Exercise: Pendulums  Standing: Shoulder flexion AAROM with red PB Shoulder AAROM ER with coregeous ball Supine: Shoulder AAROM chest press with dowel Shoulder AAROM flexion with dowel (90 deg) Shoulder PROM scaption (<90 deg) Shoulder AAROM ER with SPC  Scap squeezes  Manual Therapy: PROM Lt shoulder per protocol STM Lt shoulder musculature Scap MWM (pillowcase) Modalities: Vaso (L) shoulder, 34 deg, med compression x 10 min   [facility] Adult PT Treatment:  DATE: 10/24/23 Therapeutic Exercise: Pendulums x 2 mins Shoulder flexion AAROM on physioball Shoulder AAROM ER on physioball  Shoulder AAROM ER with dowel Scap squeeze x 15 Supine flexion AAROM with dowel Manual Therapy: PROM Lt shoulder per protocol STM Lt shoulder musculature Modalities: Vaso 10 min Lt shoudler low pressure 34 degrees   PATIENT EDUCATION: Education  details: HEP Person educated: Patient and spouse Education method: Explanation, Facilities manager, and Handouts Education comprehension: verbalized understanding, returned demonstration, and needs further education  HOME EXERCISE PROGRAM: Access Code: [email or web address] URL: [email or web address] Date: 10/30/2023 Prepared by: Edmonia Caprio  Exercises - Seated Cervical Rotation AROM  - 2 x daily - 7 x weekly - 1 sets - 10 reps - Seated Cervical Extension AROM  - 2 x daily - 7 x weekly - 1 sets - 10 reps - Wrist AROM Flexion Extension  - 2 x daily - 7 x weekly - 1 sets - 10 reps - seated ball squeeze  - 2 x daily - 7 x weekly - 1 sets - 10 reps - Seated Scapular Retraction  - 1 x daily - 7 x weekly - 3 sets - 10 reps - Circular Shoulder Pendulum with Table Support  - 1 x daily - 7 x weekly - 3 sets - 10 reps - Flexion-Extension Shoulder Pendulum with Table Support  - 1 x daily - 7 x weekly - 3 sets - 10 reps - Horizontal Shoulder Pendulum with Table Support  - 1 x daily - 7 x weekly - 3 sets - 10 reps - Standing Single Arm Shoulder Flexion Towel Slide at Table Top  - 1 x daily - 7 x weekly - 3 sets - 10 reps - Supine Shoulder External Rotation with Dowel  - 1 x daily - 7 x weekly - 3 sets - 10 reps - Supine Shoulder Horizontal Abduction Adduction AAROM with Dowel  - 1 x daily - 7 x weekly - 3 sets - 10 reps  ASSESSMENT:  CLINICAL IMPRESSION: Patient noted improvement in L shoulder stiffness with treatment during today's session. He enjoyed PROM into external rotation and horizontal abduction/adduction and requested a home exercise to replicate these motions. Dowel exercises into these directions were added to his home program but reviewed current ROM restrictions per protocol. Overall, patient progressing as expected and physical therapy remains indicated.    OBJECTIVE IMPAIRMENTS: decreased activity tolerance, decreased ROM, decreased strength, increased edema, increased fascial  restrictions, increased muscle spasms, impaired flexibility, impaired tone, postural dysfunction, and pain.     GOALS: Goals reviewed with patient? Yes  SHORT TERM GOALS: Target date: 11/13/23  The patient will be indep with HEP. Baseline: initiated at eval Goal status: INITIAL  2.  The patient will have PROM documented. Baseline:  began <1 week with elbow and wrist ROM + grip and neck ROM Goal status: MET   LONG TERM GOALS: Target date: 12/13/23  The patient will be indep with HEP. Baseline:  initiated at eval Goal status: INITIAL  2.  The patient will improve ROM to protocol ranges of flexion to 110-120, ER 30-45. Baseline: not assessed at eval Goal status: INITIAL  3.  The patient will tolerate isometrics below shoulder level. Baseline:  not assessed at eval Goal status: INITIAL  4.  The patient will reach into shoulder flexion/scaption to 90 degrees to demo improved strength against gravity. Baseline: not assessed at eval. Goal status: INITIAL  5.  The patient will reduce pain to < or equal to 2/10. Baseline:  5-9/10  Goal status: INITIAL 6.  Pt will improve FOTO to >= 74 Baseline:  Goal status: INITIAL    PLAN:  PT FREQUENCY: 1-2x/week  PT DURATION: 8 weeks  PLANNED INTERVENTIONS: 97164- PT Re-evaluation, 97110-Therapeutic exercises, 97530- Therapeutic activity, 97112- Neuromuscular re-education, 97535- Self Care, 16109- Manual therapy, 97014- Electrical stimulation (unattended), 97016- Vasopneumatic device, Patient/Family education, Taping, Dry Needling, Joint mobilization, Cryotherapy, and Moist heat  PLAN FOR NEXT SESSION: progress per protocol    Clelia Schaumann, PT 10/30/2023, 3:09 PM

## 2023-11-04 ENCOUNTER — Ambulatory Visit: Payer: Medicare HMO

## 2023-11-04 DIAGNOSIS — M6281 Muscle weakness (generalized): Secondary | ICD-10-CM

## 2023-11-04 DIAGNOSIS — R600 Localized edema: Secondary | ICD-10-CM

## 2023-11-04 DIAGNOSIS — G8929 Other chronic pain: Secondary | ICD-10-CM | POA: Diagnosis not present

## 2023-11-04 DIAGNOSIS — Z5189 Encounter for other specified aftercare: Secondary | ICD-10-CM | POA: Diagnosis not present

## 2023-11-04 DIAGNOSIS — Z96612 Presence of left artificial shoulder joint: Secondary | ICD-10-CM | POA: Diagnosis not present

## 2023-11-04 DIAGNOSIS — R293 Abnormal posture: Secondary | ICD-10-CM | POA: Diagnosis not present

## 2023-11-04 DIAGNOSIS — M25512 Pain in left shoulder: Secondary | ICD-10-CM

## 2023-11-04 NOTE — Therapy (Signed)
 OUTPATIENT PHYSICAL THERAPY SHOULDER TREATMENT   Patient Name: Joshua [name] [employer] III MRN: [id number] DOB:[birth year], 77 y.o., male Today's Date: 11/04/2023  END OF SESSION:  PT End of Session - 11/04/23 1018     Visit Number 7    Number of Visits 16    Date for PT Re-Evaluation 12/12/23    Authorization Type [employer] medicare    Authorization Time Period 12/9-12/12/23    Authorization - Visit Number 7    Authorization - Number of Visits 16    Progress Note Due on Visit 10    PT Start Time 1017    PT Stop Time 1058    PT Time Calculation (min) 41 min    Activity Tolerance Patient tolerated treatment well    Behavior During Therapy [facility] for tasks assessed/performed              Past Medical History:  Diagnosis Date   Abnormal findings on diagnostic imaging of lung 09/23/2019   09/22/2019-lung cancer screening-centrilobular and paraseptal emphysema, calcified granulomata noted bilaterally, no suspicious nodules, lung RADS 1, repeat in 12 months    Anxiety    Atrial fibrillation (HCC) 09/23/2019   Centrilobular emphysema (HCC) 09/23/2019   09/22/2019-lung cancer screening-centrilobular and paraseptal emphysema, calcified granulomata noted bilaterally, no suspicious nodules, lung RADS 1, repeat in 12 months  09/23/2019-FVC 3.74 (81% predicted), postbronchodilator ratio 80, postbronchodilator FEV1 2.88 (86% predicted), no bronchodilator response, DLCO 19.44 (73% predicted)   DDD (degenerative disc disease), cervical 05/13/2008   Cervical Disc Degeneration   Depression    ETOH abuse 10/01/2023   continues to drink 1-3 drinks/ day   History of adenomatous polyp of colon 03/03/2018   05/14/17: Colonoscopy: nonadvanced adenoma, f/u 5 yrs, use SuPrep, Murphy/GAP  Last Assessment & Plan:  Relevant Hx: Course: Daily Update: Today's Plan:   History of kidney stones    Hyperlipidemia    Kidney stone    Nephrolithiasis 05/22/2011   Nontraumatic incomplete tear of right rotator cuff  10/14/2018   Other and unspecified hyperlipidemia 11/20/2009   Hyperlipidemia   RBBB 06/09/2012   Secondary hypercoagulable state (HCC) 09/28/2019   Shortness of breath 09/23/2019   Past Surgical History:  Procedure Laterality Date   CARDIOVERSION N/A 10/15/2019   Procedure: CARDIOVERSION;  Surgeon: Jeffrie Oneil BROCKS, MD;  Location: [facility] ENDOSCOPY;  Service: Cardiovascular;  Laterality: N/A;   CARDIOVERSION N/A 11/29/2019   Procedure: CARDIOVERSION;  Surgeon: Maranda Leim DEL, MD;  Location[facility] ENDOSCOPY;  Service: Cardiovascular;  Laterality: N/A;   HEMORRHOID SURGERY     REVERSE SHOULDER ARTHROPLASTY Left 10/09/2023   Procedure: REVERSE SHOULDER ARTHROPLASTY;  Surgeon: Cristy Bonner DASEN, MD;  Location: [facility];  Service: Orthopedics;  Laterality: Left;   TOTAL HIP ARTHROPLASTY Right 12/21/2021   Procedure: TOTAL HIP ARTHROPLASTY ANTERIOR APPROACH;  Surgeon: Yvone Rush, MD;  Location: [facility] ORS;  Service: Orthopedics;  Laterality: Right;   VARICOSE VEIN SURGERY Right    Patient Active Problem List   Diagnosis Date Noted   Alcohol-induced mood disorder with depressive symptoms (HCC) 06/10/2023   Benign prostatic hyperplasia without lower urinary tract symptoms 06/05/2023   Dyslipidemia 06/05/2023   Fall 06/05/2023   History of peripheral neuropathy 06/05/2023   Subdural hematoma (HCC) 06/04/2023   Pain in finger of left hand 03/12/2023   Alcohol abuse 05/31/2022   Disorder of thoracic spine 05/31/2022   COVID-19 virus infection 03/04/2022   Acute bronchitis 03/04/2022   OSA (obstructive sleep apnea) 03/04/2022   PAF (paroxysmal atrial  fibrillation) (HCC) 12/13/2021   Anxiety 11/08/2021   Aphasia 11/08/2021   Bipolar 1 disorder (HCC) 11/08/2021   CKD (chronic kidney disease), stage III (HCC) 11/08/2021   Expressive aphasia 11/08/2021   Prolonged QT interval 11/08/2021   Recurrent falls 11/08/2021   Primary osteoarthritis of right hip 10/26/2021   Abnormal liver  function tests 09/10/2021   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   Urinary hesitancy 03/08/2021   Thoracic aortic aneurysm without rupture (HCC) 03/08/2021   Senile purpura (HCC) 09/07/2020   Aortic atherosclerosis (HCC) 09/07/2020   Sinus bradycardia 03/31/2020   Essential hypertension 03/31/2020   Major depressive disorder 03/07/2020   Essential tremor 03/07/2020   Tremor 01/21/2020   Obesity (BMI 30-39.9) 12/10/2019   Chest pain of uncertain etiology 12/10/2019   Secondary hypercoagulable state (HCC) 09/28/2019   Centrilobular emphysema (HCC) 09/23/2019   Former smoker 09/23/2019   Abnormal findings on diagnostic imaging of lung 09/23/2019   Atrial fibrillation (HCC) 09/23/2019   At risk for sleep apnea 09/23/2019   Shortness of breath 09/23/2019   Nontraumatic incomplete tear of right rotator cuff 10/14/2018   History of adenomatous polyp of colon 03/03/2018   RBBB 06/09/2012   Nephrolithiasis 05/22/2011   Mixed hyperlipidemia 11/20/2009   DDD (degenerative disc disease), cervical 05/13/2008    PCP: Velma Ku, DO REFERRING PROVIDER: Bonner Hair, MD REFERRING DIAG: [phone] (ICD-10-CM) - Status post reverse total arthroplasty of left shoulder  THERAPY DIAG:  Acute pain of left shoulder  Muscle weakness (generalized)  Localized edema  Abnormal posture  Rationale for Evaluation and Treatment: Rehabilitation  ONSET DATE: 10/09/23 DATE OF SURGERY  SUBJECTIVE:                                                                                                                                                                                     SUBJECTIVE STATEMENT: Shoulder is feeling better. Is able to do more now without even thinking about it.   Hand dominance: Right  PERTINENT HISTORY: H/o a-fib, loop recorder, ETOH use, depression  PAIN:  Are you having pain? Yes: NPRS scale: 4/10 Pain location: anterolateral shoulder Pain description: sharp Aggravating factors:  sitting/sleeping Relieving factors: medicine  PRECAUTIONS: Shoulder and Other: reverse total shoulder replacement  to follow protocol for reverse total shoulder  WEIGHT BEARING RESTRICTIONS: Yes < 1 lb per protocol  FALLS:  Has patient fallen in last 6 months? Yes. Number of falls 2x in the past 6 months. He has a h/o drinking and felt like gabapentin  + alcohol led to falls.    LIVING ENVIRONMENT: Lives with: lives with their spouse Lives in: House/apartment Stairs:  2 to garage and 2 to deck in  back  OCCUPATION: Retired from airline pilot; enjoys chiropractor things  PLOF: Independent  PATIENT GOALS:reduced pain and improve use of the arm as close to normal as I can get  NEXT MD VISIT:  11/06/23  OBJECTIVE:  Note: Objective measures were completed at Evaluation unless otherwise noted.  PATIENT SURVEYS:  FOTO 44 11/04/23: 44%   EDEMA: Pocket of edema in distal medial arm, bruising in proximal biceps bruising and edema noted dressing in place  POSTURE: Rounded shoulders and forward head position  UPPER EXTREMITY ROM:   will assess next visit (< 1 week-- focused on elbow, wrist, positioning today) Passive ROM Right eval Left 12/13  Shoulder flexion  75  Shoulder extension    Shoulder abduction    Shoulder adduction    Shoulder internal rotation    Shoulder external rotation  0  Elbow flexion    Elbow extension    Wrist flexion    Wrist extension    Wrist ulnar deviation    Wrist radial deviation    Wrist pronation    Wrist supination    (Blank rows = not tested)  UPPER EXTREMITY MMT:  wrist flexion/extension grip strength 35 L and 60 R UNABLE-- will assess once protocol allows MMT Right eval Left eval  Shoulder flexion    Shoulder extension    Shoulder abduction    Shoulder adduction    Shoulder internal rotation    Shoulder external rotation    Middle trapezius    Lower trapezius    Elbow flexion    Elbow extension    Wrist flexion    Wrist extension     Wrist ulnar deviation    Wrist radial deviation    Wrist pronation    Wrist supination    Grip strength (lbs)    (Blank rows = not tested) [facility] Adult PT Treatment:                                                DATE: 11/04/23 Therapeutic Exercise: Supine shoulder flexion AAROM with RUE for support x 10; 5 sec hold Seated elbow flexion/extension AROM x 10  Reviewed HEP Manual Therapy: Lt shoulder PROM within post-op restrictions Lt elbow flexion/extension PROM STM Lt bicep, deltoid   Self Care: Reviewed post-op precautions/protocol   [facility] Adult PT Treatment:                                                DATE: 10/30/2023 Therapeutic Exercise: Supine: Shoulder PROM flexion, external rotation, abduction, horizontal adduction, horizontal abduction, within protocol restrictions Manual Therapy: Supine: Soft tissue mobilization L biceps (pin and stretch), L anterior and middle deltoid, L upper trapezius, L levator scapulae, L midscapular musculature Scapular mobilization with movement with pillowcase during AAROM into shoulder flexion with dowel  [facility] Adult PT Treatment:                                                DATE: 10/27/2023 Therapeutic Exercise: Pendulums  Standing: Shoulder flexion AAROM with red PB Shoulder AAROM ER with coregeous ball Supine: Shoulder AAROM chest press with dowel  Shoulder AAROM flexion with dowel (90 deg) Shoulder PROM scaption (<90 deg) Shoulder AAROM ER with SPC  Scap squeezes  Manual Therapy: PROM Lt shoulder per protocol STM Lt shoulder musculature Scap MWM (pillowcase) Modalities: Vaso (L) shoulder, 34 deg, med compression x 10 min   [facility] Adult PT Treatment:                                                DATE: 10/24/23 Therapeutic Exercise: Pendulums x 2 mins Shoulder flexion AAROM on physioball Shoulder AAROM ER on physioball  Shoulder AAROM ER with dowel Scap squeeze x 15 Supine flexion AAROM with dowel Manual Therapy: PROM Lt  shoulder per protocol STM Lt shoulder musculature Modalities: Vaso 10 min Lt shoudler low pressure 34 degrees   PATIENT EDUCATION: Education details: HEP review Person educated: Patient  Education method: Explanation Education comprehension: verbalized understanding  HOME EXERCISE PROGRAM: Access Code: [email or web address] URL: [email or web address] Date: 10/30/2023 Prepared by: Honora Rei  Exercises - Seated Cervical Rotation AROM  - 2 x daily - 7 x weekly - 1 sets - 10 reps - Seated Cervical Extension AROM  - 2 x daily - 7 x weekly - 1 sets - 10 reps - Wrist AROM Flexion Extension  - 2 x daily - 7 x weekly - 1 sets - 10 reps - seated ball squeeze  - 2 x daily - 7 x weekly - 1 sets - 10 reps - Seated Scapular Retraction  - 1 x daily - 7 x weekly - 3 sets - 10 reps - Circular Shoulder Pendulum with Table Support  - 1 x daily - 7 x weekly - 3 sets - 10 reps - Flexion-Extension Shoulder Pendulum with Table Support  - 1 x daily - 7 x weekly - 3 sets - 10 reps - Horizontal Shoulder Pendulum with Table Support  - 1 x daily - 7 x weekly - 3 sets - 10 reps - Standing Single Arm Shoulder Flexion Towel Slide at Table Top  - 1 x daily - 7 x weekly - 3 sets - 10 reps - Supine Shoulder External Rotation with Dowel  - 1 x daily - 7 x weekly - 3 sets - 10 reps - Supine Shoulder Horizontal Abduction Adduction AAROM with Dowel  - 1 x daily - 7 x weekly - 3 sets - 10 reps  ASSESSMENT:  CLINICAL IMPRESSION: Patient tolerated session well today with continued emphasis on PROM and AAROM of the Lt shoulder. Mild guarding initially with PROM that improves with time. Time spent discussing precautions and protocol reminding patient on continued sling use and motions to avoid with patient verbalizing understanding. No changes made to HEP this visit.    OBJECTIVE IMPAIRMENTS: decreased activity tolerance, decreased ROM, decreased strength, increased edema, increased fascial restrictions, increased  muscle spasms, impaired flexibility, impaired tone, postural dysfunction, and pain.     GOALS: Goals reviewed with patient? Yes  SHORT TERM GOALS: Target date: 11/13/23  The patient will be indep with HEP. Baseline: initiated at eval Goal status: INITIAL  2.  The patient will have PROM documented. Baseline:  began <1 week with elbow and wrist ROM + grip and neck ROM Goal status: MET   LONG TERM GOALS: Target date: 12/13/23  The patient will be indep with HEP. Baseline:  initiated at eval Goal status: INITIAL  2.  The patient  will improve ROM to protocol ranges of flexion to 110-120, ER 30-45. Baseline: not assessed at eval Goal status: INITIAL  3.  The patient will tolerate isometrics below shoulder level. Baseline:  not assessed at eval Goal status: INITIAL  4.  The patient will reach into shoulder flexion/scaption to 90 degrees to demo improved strength against gravity. Baseline: not assessed at eval. Goal status: INITIAL  5.  The patient will reduce pain to < or equal to 2/10. Baseline:  5-9/10 Goal status: INITIAL 6.  Pt will improve FOTO to >= 74 Baseline:  Goal status: INITIAL    PLAN:  PT FREQUENCY: 1-2x/week  PT DURATION: 8 weeks  PLANNED INTERVENTIONS: 97164- PT Re-evaluation, 97110-Therapeutic exercises, 97530- Therapeutic activity, 97112- Neuromuscular re-education, 97535- Self Care, [id number]- Manual therapy, 97014- Electrical stimulation (unattended), 97016- Vasopneumatic device, Patient/Family education, Taping, Dry Needling, Joint mobilization, Cryotherapy, and Moist heat  PLAN FOR NEXT SESSION: progress per protocol    Lucie Meeter, PT, DPT, ATC 11/04/23 10:59 AM

## 2023-11-06 ENCOUNTER — Ambulatory Visit: Payer: Medicare HMO | Attending: Orthopaedic Surgery

## 2023-11-06 DIAGNOSIS — M25512 Pain in left shoulder: Secondary | ICD-10-CM | POA: Insufficient documentation

## 2023-11-06 DIAGNOSIS — M6281 Muscle weakness (generalized): Secondary | ICD-10-CM | POA: Insufficient documentation

## 2023-11-06 DIAGNOSIS — R293 Abnormal posture: Secondary | ICD-10-CM | POA: Diagnosis not present

## 2023-11-06 DIAGNOSIS — M19012 Primary osteoarthritis, left shoulder: Secondary | ICD-10-CM | POA: Diagnosis not present

## 2023-11-06 DIAGNOSIS — R600 Localized edema: Secondary | ICD-10-CM | POA: Diagnosis not present

## 2023-11-06 NOTE — Therapy (Signed)
 OUTPATIENT PHYSICAL THERAPY SHOULDER TREATMENT   Patient Name: [name] MRN: [id number] DOB:[birth year], 78 y.o., male Today's Date: 11/06/2023  END OF SESSION:  PT End of Session - 11/06/23 1057     Visit Number 8    Number of Visits 16    Date for PT Re-Evaluation 12/12/23    Authorization Type [employer] medicare    Authorization Time Period 12/9-12/12/23    Authorization - Visit Number 8    Authorization - Number of Visits 16    Progress Note Due on Visit 10    PT Start Time 1100    PT Stop Time 1150    PT Time Calculation (min) 50 min    Activity Tolerance Patient tolerated treatment well    Behavior During Therapy [facility] for tasks assessed/performed               Past Medical History:  Diagnosis Date   Abnormal findings on diagnostic imaging of lung 09/23/2019   09/22/2019-lung cancer screening-centrilobular and paraseptal emphysema, calcified granulomata noted bilaterally, no suspicious nodules, lung RADS 1, repeat in 12 months    Anxiety    Atrial fibrillation (HCC) 09/23/2019   Centrilobular emphysema (HCC) 09/23/2019   09/22/2019-lung cancer screening-centrilobular and paraseptal emphysema, calcified granulomata noted bilaterally, no suspicious nodules, lung RADS 1, repeat in 12 months  09/23/2019-FVC 3.74 (81% predicted), postbronchodilator ratio 80, postbronchodilator FEV1 2.88 (86% predicted), no bronchodilator response, DLCO 19.44 (73% predicted)   DDD (degenerative disc disease), cervical 05/13/2008   Cervical Disc Degeneration   Depression    ETOH abuse 10/01/2023   continues to drink 1-3 drinks/ day   History of adenomatous polyp of colon 03/03/2018   05/14/17: Colonoscopy: nonadvanced adenoma, f/u 5 yrs, use SuPrep, Murphy/GAP  Last Assessment & Plan:  Relevant Hx: Course: Daily Update: Today's Plan:   History of kidney stones    Hyperlipidemia    Kidney stone    Nephrolithiasis 05/22/2011   Nontraumatic incomplete tear of right rotator cuff  10/14/2018   Other and unspecified hyperlipidemia 11/20/2009   Hyperlipidemia   RBBB 06/09/2012   Secondary hypercoagulable state (HCC) 09/28/2019   Shortness of breath 09/23/2019   Past Surgical History:  Procedure Laterality Date   CARDIOVERSION N/A 10/15/2019   Procedure: CARDIOVERSION;  Surgeon: Jeffrie Oneil BROCKS, MD;  Location: [facility] ENDOSCOPY;  Service: Cardiovascular;  Laterality: N/A;   CARDIOVERSION N/A 11/29/2019   Procedure: CARDIOVERSION;  Surgeon: Maranda Leim DEL, MD;  Location: [facility] ENDOSCOPY;  Service: Cardiovascular;  Laterality: N/A;   HEMORRHOID SURGERY     REVERSE SHOULDER ARTHROPLASTY Left 10/09/2023   Procedure: REVERSE SHOULDER ARTHROPLASTY;  Surgeon: Cristy Bonner DASEN, MD;  Location: [facility];  Service: Orthopedics;  Laterality: Left;   TOTAL HIP ARTHROPLASTY Right 12/21/2021   Procedure: TOTAL HIP ARTHROPLASTY ANTERIOR APPROACH;  Surgeon: Yvone Rush, MD;  Location: [facility] ORS;  Service: Orthopedics;  Laterality: Right;   VARICOSE VEIN SURGERY Right    Patient Active Problem List   Diagnosis Date Noted   Alcohol-induced mood disorder with depressive symptoms (HCC) 06/10/2023   Benign prostatic hyperplasia without lower urinary tract symptoms 06/05/2023   Dyslipidemia 06/05/2023   Fall 06/05/2023   History of peripheral neuropathy 06/05/2023   Subdural hematoma (HCC) 06/04/2023   Pain in finger of left hand 03/12/2023   Alcohol abuse 05/31/2022   Disorder of thoracic spine 05/31/2022   COVID-19 virus infection 03/04/2022   Acute bronchitis 03/04/2022   OSA (obstructive sleep apnea) 03/04/2022   PAF (paroxysmal  atrial fibrillation) (HCC) 12/13/2021   Anxiety 11/08/2021   Aphasia 11/08/2021   Bipolar 1 disorder (HCC) 11/08/2021   CKD (chronic kidney disease), stage III (HCC) 11/08/2021   Expressive aphasia 11/08/2021   Prolonged QT interval 11/08/2021   Recurrent falls 11/08/2021   Primary osteoarthritis of right hip 10/26/2021   Abnormal liver  function tests 09/10/2021   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   Urinary hesitancy 03/08/2021   Thoracic aortic aneurysm without rupture (HCC) 03/08/2021   Senile purpura (HCC) 09/07/2020   Aortic atherosclerosis (HCC) 09/07/2020   Sinus bradycardia 03/31/2020   Essential hypertension 03/31/2020   Major depressive disorder 03/07/2020   Essential tremor 03/07/2020   Tremor 01/21/2020   Obesity (BMI 30-39.9) 12/10/2019   Chest pain of uncertain etiology 12/10/2019   Secondary hypercoagulable state (HCC) 09/28/2019   Centrilobular emphysema (HCC) 09/23/2019   Former smoker 09/23/2019   Abnormal findings on diagnostic imaging of lung 09/23/2019   Atrial fibrillation (HCC) 09/23/2019   At risk for sleep apnea 09/23/2019   Shortness of breath 09/23/2019   Nontraumatic incomplete tear of right rotator cuff 10/14/2018   History of adenomatous polyp of colon 03/03/2018   RBBB 06/09/2012   Nephrolithiasis 05/22/2011   Mixed hyperlipidemia 11/20/2009   DDD (degenerative disc disease), cervical 05/13/2008    PCP: Velma Ku, DO REFERRING PROVIDER: Bonner Hair, MD REFERRING DIAG: [phone] (ICD-10-CM) - Status post reverse total arthroplasty of left shoulder  THERAPY DIAG:  Acute pain of left shoulder  Muscle weakness (generalized)  Localized edema  Abnormal posture  Rationale for Evaluation and Treatment: Rehabilitation  ONSET DATE: 10/09/23 DATE OF SURGERY  SUBJECTIVE:                                                                                                                                                                                     SUBJECTIVE STATEMENT: Patient reports shoulder continues to feel good. Doesn't have pain unless he is moving around some. Has f/u with Dr. Hair today.   Hand dominance: Right  PERTINENT HISTORY: H/o a-fib, loop recorder, ETOH use, depression  PAIN:  Are you having pain? Yes: NPRS scale: none currently; at worst 5/10 Pain location:  anterolateral shoulder Pain description: sharp Aggravating factors: movement Relieving factors: medicine  PRECAUTIONS: Shoulder and Other: reverse total shoulder replacement  to follow protocol for reverse total shoulder  WEIGHT BEARING RESTRICTIONS: Yes < 1 lb per protocol  FALLS:  Has patient fallen in last 6 months? Yes. Number of falls 2x in the past 6 months. He has a h/o drinking and felt like gabapentin  + alcohol led to falls.    LIVING ENVIRONMENT: Lives with: lives with their spouse Lives  in: House/apartment Stairs:  2 to garage and 2 to deck in back  OCCUPATION: Retired from airline pilot; enjoys chiropractor things  PLOF: Independent  PATIENT GOALS:reduced pain and improve use of the arm as close to normal as I can get  NEXT MD VISIT:  11/06/23  OBJECTIVE:  Note: Objective measures were completed at Evaluation unless otherwise noted.  PATIENT SURVEYS:  FOTO 44 11/04/23: 44%   EDEMA: Pocket of edema in distal medial arm, bruising in proximal biceps bruising and edema noted dressing in place  POSTURE: Rounded shoulders and forward head position  UPPER EXTREMITY ROM:   will assess next visit (< 1 week-- focused on elbow, wrist, positioning today) Passive ROM Right eval Left 12/13 11/06/23 Left   Shoulder flexion  75 140 PROM   Shoulder extension     Shoulder abduction   110 PROM  Shoulder adduction     Shoulder internal rotation     Shoulder external rotation  0 32 PROM   Elbow flexion     Elbow extension     Wrist flexion     Wrist extension     Wrist ulnar deviation     Wrist radial deviation     Wrist pronation     Wrist supination     (Blank rows = not tested)  UPPER EXTREMITY MMT:  wrist flexion/extension grip strength 35 L and 60 R UNABLE-- will assess once protocol allows MMT Right eval Left eval  Shoulder flexion    Shoulder extension    Shoulder abduction    Shoulder adduction    Shoulder internal rotation    Shoulder external rotation     Middle trapezius    Lower trapezius    Elbow flexion    Elbow extension    Wrist flexion    Wrist extension    Wrist ulnar deviation    Wrist radial deviation    Wrist pronation    Wrist supination    Grip strength (lbs)    (Blank rows = not tested) [facility] Adult PT Treatment:                                                DATE: 11/06/23 Therapeutic Exercise: Supine shoulder flexion AAROM with RUE x 10; 5 sec hold  Shoulder flexion, ER, and abduction isometrics x 10; 5 sec hold Shoulder abduction towel slides at table x 10 Updated HEP  Manual Therapy: Lt shoulder PROM within post-op restrictions   Modalities: Ice pack to Lt shoulder x 10 minutes  Self Care: Discontinue use of sling. ROM and lifting restrictions.    [facility] Adult PT Treatment:                                                DATE: 11/04/23 Therapeutic Exercise: Supine shoulder flexion AAROM with RUE for support x 10; 5 sec hold Seated elbow flexion/extension AROM x 10  Reviewed HEP Manual Therapy: Lt shoulder PROM within post-op restrictions Lt elbow flexion/extension PROM STM Lt bicep, deltoid   Self Care: Reviewed post-op precautions/protocol   [facility] Adult PT Treatment:  DATE: 10/30/2023 Therapeutic Exercise: Supine: Shoulder PROM flexion, external rotation, abduction, horizontal adduction, horizontal abduction, within protocol restrictions Manual Therapy: Supine: Soft tissue mobilization L biceps (pin and stretch), L anterior and middle deltoid, L upper trapezius, L levator scapulae, L midscapular musculature Scapular mobilization with movement with pillowcase during AAROM into shoulder flexion with dowel  [facility] Adult PT Treatment:                                                DATE: 10/27/2023 Therapeutic Exercise: Pendulums  Standing: Shoulder flexion AAROM with red PB Shoulder AAROM ER with coregeous ball Supine: Shoulder AAROM chest press with  dowel Shoulder AAROM flexion with dowel (90 deg) Shoulder PROM scaption (<90 deg) Shoulder AAROM ER with SPC  Scap squeezes  Manual Therapy: PROM Lt shoulder per protocol STM Lt shoulder musculature Scap MWM (pillowcase) Modalities: Vaso (L) shoulder, 34 deg, med compression x 10 min   PATIENT EDUCATION: Education details: HEP update; see treatment  Person educated: Patient  Education method: Explanation, demo, cues, handout Education comprehension: verbalized understanding, returned demo, cues   HOME EXERCISE PROGRAM: Access Code: [email or web address] URL: [email or web address] Date: 11/06/2023 Prepared by: Lucie Meeter  Exercises - Seated Scapular Retraction  - 1 x daily - 7 x weekly - 3 sets - 10 reps - Standing Single Arm Shoulder Flexion Towel Slide at Table Top  - 1 x daily - 7 x weekly - 1 sets - 10 reps - 5 sec  hold - Supine Shoulder External Rotation with Dowel  - 1 x daily - 7 x weekly - 1 sets - 10 reps - 5 sec  hold - Supine Shoulder Horizontal Abduction Adduction AAROM with Dowel  - 1 x daily - 7 x weekly - 1 sets - 10 reps - 5 sec hold - Supine Shoulder Flexion AAROM with Hands Clasped  - 1 x daily - 7 x weekly - 1 sets - 10 reps - 5 sec  hold - Isometric Shoulder Flexion at Wall  - 1 x daily - 7 x weekly - 2 sets - 10 reps - Standing Isometric Shoulder External Rotation with Doorway  - 1 x daily - 7 x weekly - 2 sets - 10 reps - Standing Isometric Shoulder Abduction with Doorway - Arm Bent  - 1 x daily - 7 x weekly - 2 sets - 10 reps - Seated Shoulder Abduction Towel Slide at Table Top  - 1 x daily - 7 x weekly - 1 sets - 10 reps - 5 sec  hold  ASSESSMENT:  CLINICAL IMPRESSION: Patient is 4 weeks s/p Lt Reverse TSA today so we discussed discontinuing use of sling per post-operative protocol and discussed his current lifting and ROM restrictions with patient verbalizing understanding. Patient is making steady progress in regards to PROM (see above for  objective measures). Introduced shoulder isometrics today without onset of pain.   OBJECTIVE IMPAIRMENTS: decreased activity tolerance, decreased ROM, decreased strength, increased edema, increased fascial restrictions, increased muscle spasms, impaired flexibility, impaired tone, postural dysfunction, and pain.     GOALS: Goals reviewed with patient? Yes  SHORT TERM GOALS: Target date: 11/13/23  The patient will be indep with HEP. Baseline: initiated at eval Goal status: INITIAL  2.  The patient will have PROM documented. Baseline:  began <1 week with elbow and wrist ROM + grip and neck ROM  Goal status: MET   LONG TERM GOALS: Target date: 12/13/23  The patient will be indep with HEP. Baseline:  initiated at eval Goal status: INITIAL  2.  The patient will improve ROM to protocol ranges of flexion to 110-120, ER 30-45. Baseline: not assessed at eval Goal status: INITIAL  3.  The patient will tolerate isometrics below shoulder level. Baseline:  not assessed at eval Goal status: INITIAL  4.  The patient will reach into shoulder flexion/scaption to 90 degrees to demo improved strength against gravity. Baseline: not assessed at eval. Goal status: INITIAL  5.  The patient will reduce pain to < or equal to 2/10. Baseline:  5-9/10 Goal status: INITIAL 6.  Pt will improve FOTO to >= 74 Baseline:  Goal status: INITIAL    PLAN:  PT FREQUENCY: 1-2x/week  PT DURATION: 8 weeks  PLANNED INTERVENTIONS: 97164- PT Re-evaluation, 97110-Therapeutic exercises, 97530- Therapeutic activity, 97112- Neuromuscular re-education, 97535- Self Care, [id number]- Manual therapy, 97014- Electrical stimulation (unattended), 97016- Vasopneumatic device, Patient/Family education, Taping, Dry Needling, Joint mobilization, Cryotherapy, and Moist heat  PLAN FOR NEXT SESSION: progress per protocol    Lucie Meeter, PT, DPT, ATC 11/06/23 11:47 AM

## 2023-11-11 ENCOUNTER — Ambulatory Visit: Payer: Medicare HMO

## 2023-11-11 DIAGNOSIS — M6281 Muscle weakness (generalized): Secondary | ICD-10-CM

## 2023-11-11 DIAGNOSIS — R600 Localized edema: Secondary | ICD-10-CM

## 2023-11-11 DIAGNOSIS — M25512 Pain in left shoulder: Secondary | ICD-10-CM | POA: Diagnosis not present

## 2023-11-11 DIAGNOSIS — R293 Abnormal posture: Secondary | ICD-10-CM | POA: Diagnosis not present

## 2023-11-11 NOTE — Therapy (Signed)
 OUTPATIENT PHYSICAL THERAPY SHOULDER TREATMENT   Patient Name: Joshua [name] [facility] MRN: [id number] DOB:09/22/1946, 78 y.o., male Today's Date: 11/11/2023  END OF SESSION:  PT End of Session - 11/11/23 0844     Visit Number 9    Number of Visits 16    Date for PT Re-Evaluation 12/12/23    Authorization Type [employer] medicare    Authorization Time Period 12/9-12/12/23    Authorization - Visit Number 9    Authorization - Number of Visits 16    Progress Note Due on Visit 10    PT Start Time 0845    PT Stop Time 0942    PT Time Calculation (min) 57 min    Activity Tolerance Patient tolerated treatment well    Behavior During Therapy [facility] for tasks assessed/performed            Past Medical History:  Diagnosis Date   Abnormal findings on diagnostic imaging of lung 09/23/2019   09/22/2019-lung cancer screening-centrilobular and paraseptal emphysema, calcified granulomata noted bilaterally, no suspicious nodules, lung RADS 1, repeat in 12 months    Anxiety    Atrial fibrillation (HCC) 09/23/2019   Centrilobular emphysema (HCC) 09/23/2019   09/22/2019-lung cancer screening-centrilobular and paraseptal emphysema, calcified granulomata noted bilaterally, no suspicious nodules, lung RADS 1, repeat in 12 months  09/23/2019-FVC 3.74 (81% predicted), postbronchodilator ratio 80, postbronchodilator FEV1 2.88 (86% predicted), no bronchodilator response, DLCO 19.44 (73% predicted)   DDD (degenerative disc disease), cervical 05/13/2008   Cervical Disc Degeneration   Depression    ETOH abuse 10/01/2023   continues to drink 1-3 drinks/ day   History of adenomatous polyp of colon 03/03/2018   05/14/17: Colonoscopy: nonadvanced adenoma, f/u 5 yrs, use SuPrep, Murphy/GAP  Last Assessment & Plan:  Relevant Hx: Course: Daily Update: Today's Plan:   History of kidney stones    Hyperlipidemia    Kidney stone    Nephrolithiasis 05/22/2011   Nontraumatic incomplete tear of right rotator cuff 10/14/2018    Other and unspecified hyperlipidemia 11/20/2009   Hyperlipidemia   RBBB 06/09/2012   Secondary hypercoagulable state (HCC) 09/28/2019   Shortness of breath 09/23/2019   Past Surgical History:  Procedure Laterality Date   CARDIOVERSION N/A 10/15/2019   Procedure: CARDIOVERSION;  Surgeon: Jeffrie Oneil BROCKS, MD;  Location: [facility] ENDOSCOPY;  Service: Cardiovascular;  Laterality: N/A;   CARDIOVERSION N/A 11/29/2019   Procedure: CARDIOVERSION;  Surgeon: Maranda Leim DEL, MD;  Location: [facility] ENDOSCOPY;  Service: Cardiovascular;  Laterality: N/A;   HEMORRHOID SURGERY     REVERSE SHOULDER ARTHROPLASTY Left 10/09/2023   Procedure: REVERSE SHOULDER ARTHROPLASTY;  Surgeon: Cristy Bonner DASEN, MD;  Location: [facility];  Service: Orthopedics;  Laterality: Left;   TOTAL HIP ARTHROPLASTY Right 12/21/2021   Procedure: TOTAL HIP ARTHROPLASTY ANTERIOR APPROACH;  Surgeon: Yvone Rush, MD;  Location: [facility] ORS;  Service: Orthopedics;  Laterality: Right;   VARICOSE VEIN SURGERY Right    Patient Active Problem List   Diagnosis Date Noted   Alcohol-induced mood disorder with depressive symptoms (HCC) 06/10/2023   Benign prostatic hyperplasia without lower urinary tract symptoms 06/05/2023   Dyslipidemia 06/05/2023   Fall 06/05/2023   History of peripheral neuropathy 06/05/2023   Subdural hematoma (HCC) 06/04/2023   Pain in finger of left hand 03/12/2023   Alcohol abuse 05/31/2022   Disorder of thoracic spine 05/31/2022   COVID-19 virus infection 03/04/2022   Acute bronchitis 03/04/2022   OSA (obstructive sleep apnea) 03/04/2022   PAF (paroxysmal atrial fibrillation) (HCC)  12/13/2021   Anxiety 11/08/2021   Aphasia 11/08/2021   Bipolar 1 disorder (HCC) 11/08/2021   CKD (chronic kidney disease), stage III (HCC) 11/08/2021   Expressive aphasia 11/08/2021   Prolonged QT interval 11/08/2021   Recurrent falls 11/08/2021   Primary osteoarthritis of right hip 10/26/2021   Abnormal liver function tests  09/10/2021   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   Urinary hesitancy 03/08/2021   Thoracic aortic aneurysm without rupture (HCC) 03/08/2021   Senile purpura (HCC) 09/07/2020   Aortic atherosclerosis (HCC) 09/07/2020   Sinus bradycardia 03/31/2020   Essential hypertension 03/31/2020   Major depressive disorder 03/07/2020   Essential tremor 03/07/2020   Tremor 01/21/2020   Obesity (BMI 30-39.9) 12/10/2019   Chest pain of uncertain etiology 12/10/2019   Secondary hypercoagulable state (HCC) 09/28/2019   Centrilobular emphysema (HCC) 09/23/2019   Former smoker 09/23/2019   Abnormal findings on diagnostic imaging of lung 09/23/2019   Atrial fibrillation (HCC) 09/23/2019   At risk for sleep apnea 09/23/2019   Shortness of breath 09/23/2019   Nontraumatic incomplete tear of right rotator cuff 10/14/2018   History of adenomatous polyp of colon 03/03/2018   RBBB 06/09/2012   Nephrolithiasis 05/22/2011   Mixed hyperlipidemia 11/20/2009   DDD (degenerative disc disease), cervical 05/13/2008    PCP: Velma Ku, DO REFERRING PROVIDER: Bonner Hair, MD REFERRING DIAG: [phone] (ICD-10-CM) - Status post reverse total arthroplasty of left shoulder  THERAPY DIAG:  Acute pain of left shoulder  Muscle weakness (generalized)  Localized edema  Abnormal posture  Rationale for Evaluation and Treatment: Rehabilitation  ONSET DATE: 10/09/23 DATE OF SURGERY  SUBJECTIVE:                                                                                                                                                                                     SUBJECTIVE STATEMENT: Patient reports his shoulder is sore after   Hand dominance: Right  PERTINENT HISTORY: H/o a-fib, loop recorder, ETOH use, depression  PAIN:  Are you having pain? Yes: NPRS scale: none currently; at worst 6/10 Pain location: anterolateral shoulder Pain description: sharp Aggravating factors: movement Relieving factors:  medicine  PRECAUTIONS: Shoulder and Other: reverse total shoulder replacement  to follow protocol for reverse total shoulder  WEIGHT BEARING RESTRICTIONS: Yes < 1 lb per protocol  FALLS:  Has patient fallen in last 6 months? Yes. Number of falls 2x in the past 6 months. He has a h/o drinking and felt like gabapentin  + alcohol led to falls.    LIVING ENVIRONMENT: Lives with: lives with their spouse Lives in: House/apartment Stairs:  2 to garage and 2 to deck in back  OCCUPATION: Retired from airline pilot;  enjoys repairing things  PLOF: Independent  PATIENT GOALS:reduced pain and improve use of the arm as close to normal as I can get  NEXT MD VISIT:  01/04/24  OBJECTIVE:  Note: Objective measures were completed at Evaluation unless otherwise noted.  PATIENT SURVEYS:  FOTO 44 11/04/23: 44%   EDEMA: Pocket of edema in distal medial arm, bruising in proximal biceps bruising and edema noted dressing in place  POSTURE: Rounded shoulders and forward head position  UPPER EXTREMITY ROM:   will assess next visit (< 1 week-- focused on elbow, wrist, positioning today) Passive ROM Right eval Left 12/13 11/06/23 Left   Shoulder flexion  75 140 PROM   Shoulder extension     Shoulder abduction   110 PROM  Shoulder adduction     Shoulder internal rotation     Shoulder external rotation  0 32 PROM   Elbow flexion     Elbow extension     Wrist flexion     Wrist extension     Wrist ulnar deviation     Wrist radial deviation     Wrist pronation     Wrist supination     (Blank rows = not tested)  UPPER EXTREMITY MMT:  wrist flexion/extension grip strength 35 L and 60 R UNABLE-- will assess once protocol allows MMT Right eval Left eval  Shoulder flexion    Shoulder extension    Shoulder abduction    Shoulder adduction    Shoulder internal rotation    Shoulder external rotation    Middle trapezius    Lower trapezius    Elbow flexion    Elbow extension    Wrist flexion    Wrist  extension    Wrist ulnar deviation    Wrist radial deviation    Wrist pronation    Wrist supination    Grip strength (lbs)    (Blank rows = not tested)  [facility] Adult PT Treatment:                                                DATE: 11/11/2023 Therapeutic Exercise: Standing: Arm slides with red PB --> flexion & scaption x20 each AAROM shoulder ER with SPC 10x5 Shoulder isometrics (L): flexion, abd, ER 10x5 each Seated shoulder scaption table slides 20x3 Supine AAROM shoulder flexion with SPC 2x10 Standing AAROM scaption with SPC 2x10  Manual Therapy: (L) shoulder PROM within protocol guidelines  Modalities: Vaso (L) shoulder, 34 deg, med compression x 10 min    [facility] Adult PT Treatment:                                                DATE: 11/06/23 Therapeutic Exercise: Supine shoulder flexion AAROM with RUE x 10; 5 sec hold  Shoulder flexion, ER, and abduction isometrics x 10; 5 sec hold Shoulder abduction towel slides at table x 10 Updated HEP  Manual Therapy: Lt shoulder PROM within post-op restrictions   Modalities: Ice pack to Lt shoulder x 10 minutes  Self Care: Discontinue use of sling. ROM and lifting restrictions.    [facility] Adult PT Treatment:  DATE: 11/04/23 Therapeutic Exercise: Supine shoulder flexion AAROM with RUE for support x 10; 5 sec hold Seated elbow flexion/extension AROM x 10  Reviewed HEP Manual Therapy: Lt shoulder PROM within post-op restrictions Lt elbow flexion/extension PROM STM Lt bicep, deltoid   Self Care: Reviewed post-op precautions/protocol    PATIENT EDUCATION: Education details: HEP update; see treatment  Person educated: Patient  Education method: Explanation, demo, cues, handout Education comprehension: verbalized understanding, returned demo, cues   HOME EXERCISE PROGRAM: Access Code: [email or web address] URL: [email or web address] Date: 11/06/2023 Prepared by: Lucie Meeter  Exercises - Seated Scapular Retraction  - 1 x daily - 7 x weekly - 3 sets - 10 reps - Standing Single Arm Shoulder Flexion Towel Slide at Table Top  - 1 x daily - 7 x weekly - 1 sets - 10 reps - 5 sec  hold - Supine Shoulder External Rotation with Dowel  - 1 x daily - 7 x weekly - 1 sets - 10 reps - 5 sec  hold - Supine Shoulder Horizontal Abduction Adduction AAROM with Dowel  - 1 x daily - 7 x weekly - 1 sets - 10 reps - 5 sec hold - Supine Shoulder Flexion AAROM with Hands Clasped  - 1 x daily - 7 x weekly - 1 sets - 10 reps - 5 sec  hold - Isometric Shoulder Flexion at Wall  - 1 x daily - 7 x weekly - 2 sets - 10 reps - Standing Isometric Shoulder External Rotation with Doorway  - 1 x daily - 7 x weekly - 2 sets - 10 reps - Standing Isometric Shoulder Abduction with Doorway - Arm Bent  - 1 x daily - 7 x weekly - 2 sets - 10 reps - Seated Shoulder Abduction Towel Slide at Table Top  - 1 x daily - 7 x weekly - 1 sets - 10 reps - 5 sec  hold  ASSESSMENT:  CLINICAL IMPRESSION:  Tactile cues provided for scapular retraction/depression to improve stabilization during scaption table slides and decrease discomfort in shoulder. Cueing provided to clarify mechanics with shoulder isometric exercises added to HEP at last visit. Shoulder AAROM and PROM continued per protocol guidelines.   OBJECTIVE IMPAIRMENTS: decreased activity tolerance, decreased ROM, decreased strength, increased edema, increased fascial restrictions, increased muscle spasms, impaired flexibility, impaired tone, postural dysfunction, and pain.     GOALS: Goals reviewed with patient? Yes  SHORT TERM GOALS: Target date: 11/13/23  The patient will be indep with HEP. Baseline: initiated at eval Goal status: MET  2.  The patient will have PROM documented. Baseline:  began <1 week with elbow and wrist ROM + grip and neck ROM Goal status: MET   LONG TERM GOALS: Target date: 12/13/23  The patient will be indep with  HEP. Baseline:  initiated at eval Goal status: INITIAL  2.  The patient will improve ROM to protocol ranges of flexion to 110-120, ER 30-45. Baseline: not assessed at eval Goal status: INITIAL  3.  The patient will tolerate isometrics below shoulder level. Baseline:  not assessed at eval Goal status: INITIAL  4.  The patient will reach into shoulder flexion/scaption to 90 degrees to demo improved strength against gravity. Baseline: not assessed at eval. Goal status: INITIAL  5.  The patient will reduce pain to < or equal to 2/10. Baseline:  5-9/10 Goal status: INITIAL  6.  Pt will improve FOTO to >= 74 Baseline:  Goal status: INITIAL    PLAN:  PT FREQUENCY:  1-2x/week  PT DURATION: 8 weeks  PLANNED INTERVENTIONS: 97164- PT Re-evaluation, 97110-Therapeutic exercises, 97530- Therapeutic activity, [id number]- Neuromuscular re-education, 97535- Self Care, [id number]- Manual therapy, 97014- Electrical stimulation (unattended), 97016- Vasopneumatic device, Patient/Family education, Taping, Dry Needling, Joint mobilization, Cryotherapy, and Moist heat  PLAN FOR NEXT SESSION: progress per protocol   Lamarr Price, PTA 11/11/23 9:33 AM

## 2023-11-13 ENCOUNTER — Ambulatory Visit: Payer: Medicare HMO

## 2023-11-13 DIAGNOSIS — M6281 Muscle weakness (generalized): Secondary | ICD-10-CM | POA: Diagnosis not present

## 2023-11-13 DIAGNOSIS — R293 Abnormal posture: Secondary | ICD-10-CM

## 2023-11-13 DIAGNOSIS — R600 Localized edema: Secondary | ICD-10-CM | POA: Diagnosis not present

## 2023-11-13 DIAGNOSIS — M25512 Pain in left shoulder: Secondary | ICD-10-CM | POA: Diagnosis not present

## 2023-11-13 NOTE — Therapy (Signed)
 OUTPATIENT PHYSICAL THERAPY SHOULDER TREATMENT Progress Note Reporting Period 10/13/23 to 11/13/23  See note below for Objective Data and Assessment of Progress/Goals.      Patient Name: Joshua [name] MRN: [id number] DOB:January 31, 1946, 78 y.o., male Today's Date: 11/13/2023  END OF SESSION:  PT End of Session - 11/13/23 1017     Visit Number 10    Number of Visits 16    Date for PT Re-Evaluation 12/12/23    Authorization Type [employer] medicare    Authorization Time Period 12/9-12/12/23    Authorization - Visit Number 10    Authorization - Number of Visits 16    Progress Note Due on Visit 20    PT Start Time 1016    PT Stop Time 1105    PT Time Calculation (min) 49 min    Activity Tolerance Patient tolerated treatment well    Behavior During Therapy [facility] for tasks assessed/performed             Past Medical History:  Diagnosis Date   Abnormal findings on diagnostic imaging of lung 09/23/2019   09/22/2019-lung cancer screening-centrilobular and paraseptal emphysema, calcified granulomata noted bilaterally, no suspicious nodules, lung RADS 1, repeat in 12 months    Anxiety    Atrial fibrillation (HCC) 09/23/2019   Centrilobular emphysema (HCC) 09/23/2019   09/22/2019-lung cancer screening-centrilobular and paraseptal emphysema, calcified granulomata noted bilaterally, no suspicious nodules, lung RADS 1, repeat in 12 months  09/23/2019-FVC 3.74 (81% predicted), postbronchodilator ratio 80, postbronchodilator FEV1 2.88 (86% predicted), no bronchodilator response, DLCO 19.44 (73% predicted)   DDD (degenerative disc disease), cervical 05/13/2008   Cervical Disc Degeneration   Depression    ETOH abuse 10/01/2023   continues to drink 1-3 drinks/ day   History of adenomatous polyp of colon 03/03/2018   05/14/17: Colonoscopy: nonadvanced adenoma, f/u 5 yrs, use SuPrep, Murphy/GAP  Last Assessment & Plan:  Relevant Hx: Course: Daily Update: Today's Plan:   History of kidney stones     Hyperlipidemia    Kidney stone    Nephrolithiasis 05/22/2011   Nontraumatic incomplete tear of right rotator cuff 10/14/2018   Other and unspecified hyperlipidemia 11/20/2009   Hyperlipidemia   RBBB 06/09/2012   Secondary hypercoagulable state (HCC) 09/28/2019   Shortness of breath 09/23/2019   Past Surgical History:  Procedure Laterality Date   CARDIOVERSION N/A 10/15/2019   Procedure: CARDIOVERSION;  Surgeon: Jeffrie Oneil BROCKS, MD;  Location: [facility] ENDOSCOPY;  Service: Cardiovascular;  Laterality: N/A;   CARDIOVERSION N/A 11/29/2019   Procedure: CARDIOVERSION;  Surgeon: Maranda Leim DEL, MD;  Location: [facility] ENDOSCOPY;  Service: Cardiovascular;  Laterality: N/A;   HEMORRHOID SURGERY     REVERSE SHOULDER ARTHROPLASTY Left 10/09/2023   Procedure: REVERSE SHOULDER ARTHROPLASTY;  Surgeon: Cristy Bonner DASEN, MD;  Location: [facility];  Service: Orthopedics;  Laterality: Left;   TOTAL HIP ARTHROPLASTY Right 12/21/2021   Procedure: TOTAL HIP ARTHROPLASTY ANTERIOR APPROACH;  Surgeon: Yvone Rush, MD;  Location: [facility] ORS;  Service: Orthopedics;  Laterality: Right;   VARICOSE VEIN SURGERY Right    Patient Active Problem List   Diagnosis Date Noted   Alcohol-induced mood disorder with depressive symptoms (HCC) 06/10/2023   Benign prostatic hyperplasia without lower urinary tract symptoms 06/05/2023   Dyslipidemia 06/05/2023   Fall 06/05/2023   History of peripheral neuropathy 06/05/2023   Subdural hematoma (HCC) 06/04/2023   Pain in finger of left hand 03/12/2023   Alcohol abuse 05/31/2022   Disorder of thoracic spine 05/31/2022   COVID-19  virus infection 03/04/2022   Acute bronchitis 03/04/2022   OSA (obstructive sleep apnea) 03/04/2022   PAF (paroxysmal atrial fibrillation) (HCC) 12/13/2021   Anxiety 11/08/2021   Aphasia 11/08/2021   Bipolar 1 disorder (HCC) 11/08/2021   CKD (chronic kidney disease), stage [name] (HCC) 11/08/2021   Expressive aphasia 11/08/2021   Prolonged QT  interval 11/08/2021   Recurrent falls 11/08/2021   Primary osteoarthritis of right hip 10/26/2021   Abnormal liver function tests 09/10/2021   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   Urinary hesitancy 03/08/2021   Thoracic aortic aneurysm without rupture (HCC) 03/08/2021   Senile purpura (HCC) 09/07/2020   Aortic atherosclerosis (HCC) 09/07/2020   Sinus bradycardia 03/31/2020   Essential hypertension 03/31/2020   Major depressive disorder 03/07/2020   Essential tremor 03/07/2020   Tremor 01/21/2020   Obesity (BMI 30-39.9) 12/10/2019   Chest pain of uncertain etiology 12/10/2019   Secondary hypercoagulable state (HCC) 09/28/2019   Centrilobular emphysema (HCC) 09/23/2019   Former smoker 09/23/2019   Abnormal findings on diagnostic imaging of lung 09/23/2019   Atrial fibrillation (HCC) 09/23/2019   At risk for sleep apnea 09/23/2019   Shortness of breath 09/23/2019   Nontraumatic incomplete tear of right rotator cuff 10/14/2018   History of adenomatous polyp of colon 03/03/2018   RBBB 06/09/2012   Nephrolithiasis 05/22/2011   Mixed hyperlipidemia 11/20/2009   DDD (degenerative disc disease), cervical 05/13/2008    PCP: Velma Ku, DO REFERRING PROVIDER: Bonner Hair, MD REFERRING DIAG: [id number] (ICD-10-CM) - Status post reverse total arthroplasty of left shoulder  THERAPY DIAG:  Acute pain of left shoulder  Muscle weakness (generalized)  Localized edema  Abnormal posture  Rationale for Evaluation and Treatment: Rehabilitation  ONSET DATE: 10/09/23 DATE OF SURGERY  SUBJECTIVE:                                                                                                                                                                                     SUBJECTIVE STATEMENT: Patient reports the shoulder is feeling a little better. Feels that he is moving the shoulder more.   Hand dominance: Right  PERTINENT HISTORY: H/o a-fib, loop recorder, ETOH use, depression  PAIN:  Are you  having pain? Yes: NPRS scale: 2/10 Pain location: anterolateral shoulder Pain description: sore Aggravating factors: movement Relieving factors: medicine  PRECAUTIONS: Shoulder and Other: reverse total shoulder replacement  to follow protocol for reverse total shoulder  WEIGHT BEARING RESTRICTIONS: Yes < 1 lb per protocol  FALLS:  Has patient fallen in last 6 months? Yes. Number of falls 2x in the past 6 months. He has a h/o drinking and felt like gabapentin  + alcohol led to falls.  LIVING ENVIRONMENT: Lives with: lives with their spouse Lives in: House/apartment Stairs:  2 to garage and 2 to deck in back  OCCUPATION: Retired from airline pilot; enjoys chiropractor things  PLOF: Independent  PATIENT GOALS:reduced pain and improve use of the arm as close to normal as I can get  NEXT MD VISIT:  01/04/24  OBJECTIVE:  Note: Objective measures were completed at Evaluation unless otherwise noted.  PATIENT SURVEYS:  FOTO 44 11/04/23: 44%  11/13/23: 58% function   EDEMA: Pocket of edema in distal medial arm, bruising in proximal biceps bruising and edema noted dressing in place  POSTURE: Rounded shoulders and forward head position  UPPER EXTREMITY ROM:   will assess next visit (< 1 week-- focused on elbow, wrist, positioning today) Passive ROM Right eval Left 12/13 11/06/23 Left  11/13/23 Left   Shoulder flexion  75 140 PROM  135 PROM  Shoulder extension      Shoulder abduction   110 PROM 112 PROM  Shoulder adduction      Shoulder internal rotation      Shoulder external rotation  0 32 PROM  42 PROM  Elbow flexion      Elbow extension      Wrist flexion      Wrist extension      Wrist ulnar deviation      Wrist radial deviation      Wrist pronation      Wrist supination      (Blank rows = not tested)  UPPER EXTREMITY MMT:  wrist flexion/extension grip strength 35 L and 60 R UNABLE-- will assess once protocol allows MMT Right eval Left eval  Shoulder flexion    Shoulder  extension    Shoulder abduction    Shoulder adduction    Shoulder internal rotation    Shoulder external rotation    Middle trapezius    Lower trapezius    Elbow flexion    Elbow extension    Wrist flexion    Wrist extension    Wrist ulnar deviation    Wrist radial deviation    Wrist pronation    Wrist supination    Grip strength (lbs)    (Blank rows = not tested) [facility] Adult PT Treatment:                                                DATE: 11/13/23 Therapeutic Exercise: Supine shoulder flexion AAROM with RUE assist x 10  Standing shoulder towel slides at wall x 10 to approx 90 degrees  Standing ER with dowel x 10; maintaining restrictions  Standing scaption AAROM with physioball rolling on plinth x 10  Seated bicep curl 2 x 10 @ 3 lbs  Manual Therapy: Lt shoulder PROM within post-op guidelines   Modalities: Game ready (vaso) Lt shoulder, 34 deg, med compression x 10 min   [facility] Adult PT Treatment:                                                DATE: 11/11/2023 Therapeutic Exercise: Standing: Arm slides with red PB --> flexion & scaption x20 each AAROM shoulder ER with SPC 10x5 Shoulder isometrics (L): flexion, abd, ER 10x5 each Seated shoulder scaption table slides 20x3  Supine AAROM shoulder flexion with SPC 2x10 Standing AAROM scaption with SPC 2x10  Manual Therapy: (L) shoulder PROM within protocol guidelines  Modalities: Vaso (L) shoulder, 34 deg, med compression x 10 min    [facility] Adult PT Treatment:                                                DATE: 11/06/23 Therapeutic Exercise: Supine shoulder flexion AAROM with RUE x 10; 5 sec hold  Shoulder flexion, ER, and abduction isometrics x 10; 5 sec hold Shoulder abduction towel slides at table x 10 Updated HEP  Manual Therapy: Lt shoulder PROM within post-op restrictions   Modalities: Ice pack to Lt shoulder x 10 minutes  Self Care: Discontinue use of sling. ROM and lifting restrictions.    [facility] Adult PT  Treatment:                                                DATE: 11/04/23 Therapeutic Exercise: Supine shoulder flexion AAROM with RUE for support x 10; 5 sec hold Seated elbow flexion/extension AROM x 10  Reviewed HEP Manual Therapy: Lt shoulder PROM within post-op restrictions Lt elbow flexion/extension PROM STM Lt bicep, deltoid   Self Care: Reviewed post-op precautions/protocol    PATIENT EDUCATION: Education details: HEP review  Person educated: Patient  Education method: Programmer, Multimedia, demo, cues, handout Education comprehension: verbalized understanding, returned demo, cues   HOME EXERCISE PROGRAM: Access Code: [email or web address] URL: [email or web address] Date: 11/06/2023 Prepared by: Lucie Meeter  Exercises - Seated Scapular Retraction  - 1 x daily - 7 x weekly - 3 sets - 10 reps - Standing Single Arm Shoulder Flexion Towel Slide at Table Top  - 1 x daily - 7 x weekly - 1 sets - 10 reps - 5 sec  hold - Supine Shoulder External Rotation with Dowel  - 1 x daily - 7 x weekly - 1 sets - 10 reps - 5 sec  hold - Supine Shoulder Horizontal Abduction Adduction AAROM with Dowel  - 1 x daily - 7 x weekly - 1 sets - 10 reps - 5 sec hold - Supine Shoulder Flexion AAROM with Hands Clasped  - 1 x daily - 7 x weekly - 1 sets - 10 reps - 5 sec  hold - Isometric Shoulder Flexion at Wall  - 1 x daily - 7 x weekly - 2 sets - 10 reps - Standing Isometric Shoulder External Rotation with Doorway  - 1 x daily - 7 x weekly - 2 sets - 10 reps - Standing Isometric Shoulder Abduction with Doorway - Arm Bent  - 1 x daily - 7 x weekly - 2 sets - 10 reps - Seated Shoulder Abduction Towel Slide at Table Top  - 1 x daily - 7 x weekly - 1 sets - 10 reps - 5 sec  hold  ASSESSMENT:  CLINICAL IMPRESSION:  Joshua [name] is progressing as expected s/p Lt reverse TSA having met goals related to passive ROM that are allowed per protocol at this time. He is tolerating progression of shoulder PROM and AAROM and  will benefit from continued skilled PT to progress his ROM and strength as appropriate per post-operative protocol in order to optimize his function.  OBJECTIVE IMPAIRMENTS: decreased activity tolerance, decreased ROM, decreased strength, increased edema, increased fascial restrictions, increased muscle spasms, impaired flexibility, impaired tone, postural dysfunction, and pain.     GOALS: Goals reviewed with patient? Yes  SHORT TERM GOALS: Target date: 11/13/23  The patient will be indep with HEP. Baseline: initiated at eval Goal status: MET  2.  The patient will have PROM documented. Baseline:  began <1 week with elbow and wrist ROM + grip and neck ROM Goal status: MET   LONG TERM GOALS: Target date: 12/13/23  The patient will be indep with HEP. Baseline:  initiated at eval Goal status: progressing   2.  The patient will improve ROM to protocol ranges of flexion to 110-120, ER 30-45. Baseline: not assessed at eval Goal status: MET  3.  The patient will tolerate isometrics below shoulder level. Baseline:  not assessed at eval Goal status: progressing   4.  The patient will reach into shoulder flexion/scaption to 90 degrees to demo improved strength against gravity. Baseline: not assessed at eval. 11/13/23: deferred due to post-op restrictions  Goal status: ongoing    5.  The patient will reduce pain to < or equal to 2/10. Baseline:  5-9/10 Goal status: MET  6.  Pt will improve FOTO to >= 74 Baseline:  Goal status: progressing     PLAN:  PT FREQUENCY: 1-2x/week  PT DURATION: 8 weeks  PLANNED INTERVENTIONS: 97164- PT Re-evaluation, 97110-Therapeutic exercises, 97530- Therapeutic activity, 97112- Neuromuscular re-education, 97535- Self Care, [id number]- Manual therapy, 97014- Electrical stimulation (unattended), 97016- Vasopneumatic device, Patient/Family education, Taping, Dry Needling, Joint mobilization, Cryotherapy, and Moist heat  PLAN FOR NEXT SESSION: progress per  protocol   Lucie Meeter, PT, DPT, ATC 11/13/23 10:59 AM

## 2023-11-18 ENCOUNTER — Ambulatory Visit: Payer: Medicare HMO

## 2023-11-18 DIAGNOSIS — M25512 Pain in left shoulder: Secondary | ICD-10-CM

## 2023-11-18 DIAGNOSIS — R293 Abnormal posture: Secondary | ICD-10-CM | POA: Diagnosis not present

## 2023-11-18 DIAGNOSIS — R600 Localized edema: Secondary | ICD-10-CM

## 2023-11-18 DIAGNOSIS — M6281 Muscle weakness (generalized): Secondary | ICD-10-CM

## 2023-11-18 NOTE — Therapy (Signed)
 OUTPATIENT PHYSICAL THERAPY SHOULDER TREATMENT      Patient Name: Joshua [name] [facility] MRN: [id number] DOB:15-Jul-1946, 78 y.o., male Today's Date: 11/18/2023  END OF SESSION:  PT End of Session - 11/18/23 0933     Visit Number 11    Number of Visits 16    Date for PT Re-Evaluation 12/12/23    Authorization Type [employer] medicare    Authorization Time Period 12/9-12/12/23    Authorization - Visit Number 11    Authorization - Number of Visits 16    Progress Note Due on Visit 20    PT Start Time 0932    PT Stop Time 1023    PT Time Calculation (min) 51 min    Activity Tolerance Patient tolerated treatment well    Behavior During Therapy [facility] for tasks assessed/performed              Past Medical History:  Diagnosis Date   Abnormal findings on diagnostic imaging of lung 09/23/2019   09/22/2019-lung cancer screening-centrilobular and paraseptal emphysema, calcified granulomata noted bilaterally, no suspicious nodules, lung RADS 1, repeat in 12 months    Anxiety    Atrial fibrillation (HCC) 09/23/2019   Centrilobular emphysema (HCC) 09/23/2019   09/22/2019-lung cancer screening-centrilobular and paraseptal emphysema, calcified granulomata noted bilaterally, no suspicious nodules, lung RADS 1, repeat in 12 months  09/23/2019-FVC 3.74 (81% predicted), postbronchodilator ratio 80, postbronchodilator FEV1 2.88 (86% predicted), no bronchodilator response, DLCO 19.44 (73% predicted)   DDD (degenerative disc disease), cervical 05/13/2008   Cervical Disc Degeneration   Depression    ETOH abuse 10/01/2023   continues to drink 1-3 drinks/ day   History of adenomatous polyp of colon 03/03/2018   05/14/17: Colonoscopy: nonadvanced adenoma, f/u 5 yrs, use SuPrep, Murphy/GAP  Last Assessment & Plan:  Relevant Hx: Course: Daily Update: Today's Plan:   History of kidney stones    Hyperlipidemia    Kidney stone    Nephrolithiasis 05/22/2011   Nontraumatic incomplete tear of right rotator cuff  10/14/2018   Other and unspecified hyperlipidemia 11/20/2009   Hyperlipidemia   RBBB 06/09/2012   Secondary hypercoagulable state (HCC) 09/28/2019   Shortness of breath 09/23/2019   Past Surgical History:  Procedure Laterality Date   CARDIOVERSION N/A 10/15/2019   Procedure: CARDIOVERSION;  Surgeon: Jeffrie Oneil BROCKS, MD;  Location: [facility] ENDOSCOPY;  Service: Cardiovascular;  Laterality: N/A;   CARDIOVERSION N/A 11/29/2019   Procedure: CARDIOVERSION;  Surgeon: Maranda Leim DEL, MD;  Location: [facility] ENDOSCOPY;  Service: Cardiovascular;  Laterality: N/A;   HEMORRHOID SURGERY     REVERSE SHOULDER ARTHROPLASTY Left 10/09/2023   Procedure: REVERSE SHOULDER ARTHROPLASTY;  Surgeon: Cristy Bonner DASEN, MD;  Location: [facility];  Service: Orthopedics;  Laterality: Left;   TOTAL HIP ARTHROPLASTY Right 12/21/2021   Procedure: TOTAL HIP ARTHROPLASTY ANTERIOR APPROACH;  Surgeon: Yvone Rush, MD;  Location: [facility] ORS;  Service: Orthopedics;  Laterality: Right;   VARICOSE VEIN SURGERY Right    Patient Active Problem List   Diagnosis Date Noted   Alcohol-induced mood disorder with depressive symptoms (HCC) 06/10/2023   Benign prostatic hyperplasia without lower urinary tract symptoms 06/05/2023   Dyslipidemia 06/05/2023   Fall 06/05/2023   History of peripheral neuropathy 06/05/2023   Subdural hematoma (HCC) 06/04/2023   Pain in finger of left hand 03/12/2023   Alcohol abuse 05/31/2022   Disorder of thoracic spine 05/31/2022   COVID-19 virus infection 03/04/2022   Acute bronchitis 03/04/2022   OSA (obstructive sleep apnea) 03/04/2022  PAF (paroxysmal atrial fibrillation) (HCC) 12/13/2021   Anxiety 11/08/2021   Aphasia 11/08/2021   Bipolar 1 disorder (HCC) 11/08/2021   CKD (chronic kidney disease), stage III (HCC) 11/08/2021   Expressive aphasia 11/08/2021   Prolonged QT interval 11/08/2021   Recurrent falls 11/08/2021   Primary osteoarthritis of right hip 10/26/2021   Abnormal liver  function tests 09/10/2021   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   Urinary hesitancy 03/08/2021   Thoracic aortic aneurysm without rupture (HCC) 03/08/2021   Senile purpura (HCC) 09/07/2020   Aortic atherosclerosis (HCC) 09/07/2020   Sinus bradycardia 03/31/2020   Essential hypertension 03/31/2020   Major depressive disorder 03/07/2020   Essential tremor 03/07/2020   Tremor 01/21/2020   Obesity (BMI 30-39.9) 12/10/2019   Chest pain of uncertain etiology 12/10/2019   Secondary hypercoagulable state (HCC) 09/28/2019   Centrilobular emphysema (HCC) 09/23/2019   Former smoker 09/23/2019   Abnormal findings on diagnostic imaging of lung 09/23/2019   Atrial fibrillation (HCC) 09/23/2019   At risk for sleep apnea 09/23/2019   Shortness of breath 09/23/2019   Nontraumatic incomplete tear of right rotator cuff 10/14/2018   History of adenomatous polyp of colon 03/03/2018   RBBB 06/09/2012   Nephrolithiasis 05/22/2011   Mixed hyperlipidemia 11/20/2009   DDD (degenerative disc disease), cervical 05/13/2008    PCP: Velma Ku, DO REFERRING PROVIDER: Bonner Hair, MD REFERRING DIAG: [phone] (ICD-10-CM) - Status post reverse total arthroplasty of left shoulder  THERAPY DIAG:  Acute pain of left shoulder  Muscle weakness (generalized)  Localized edema  Abnormal posture  Rationale for Evaluation and Treatment: Rehabilitation  ONSET DATE: 10/09/23 DATE OF SURGERY  SUBJECTIVE:                                                                                                                                                                                     SUBJECTIVE STATEMENT: Better, but still a little ache.  Hand dominance: Right  PERTINENT HISTORY: H/o a-fib, loop recorder, ETOH use, depression  PAIN:  Are you having pain? Yes: NPRS scale: 2/10 Pain location: anterolateral shoulder Pain description: ache Aggravating factors: movement Relieving factors: medicine  PRECAUTIONS: Shoulder  and Other: reverse total shoulder replacement  to follow protocol for reverse total shoulder  WEIGHT BEARING RESTRICTIONS: Yes < 1 lb per protocol  FALLS:  Has patient fallen in last 6 months? Yes. Number of falls 2x in the past 6 months. He has a h/o drinking and felt like gabapentin  + alcohol led to falls.    LIVING ENVIRONMENT: Lives with: lives with their spouse Lives in: House/apartment Stairs:  2 to garage and 2 to deck in back  OCCUPATION: Retired from airline pilot; enjoys  repairing things  PLOF: Independent  PATIENT GOALS:reduced pain and improve use of the arm as close to normal as I can get  NEXT MD VISIT:  01/04/24  OBJECTIVE:  Note: Objective measures were completed at Evaluation unless otherwise noted.  PATIENT SURVEYS:  FOTO 44 11/04/23: 44%  11/13/23: 58% function   EDEMA: Pocket of edema in distal medial arm, bruising in proximal biceps bruising and edema noted dressing in place  POSTURE: Rounded shoulders and forward head position  UPPER EXTREMITY ROM:   will assess next visit (< 1 week-- focused on elbow, wrist, positioning today) Passive ROM Right eval Left 12/13 11/06/23 Left  11/13/23 Left   Shoulder flexion  75 140 PROM  135 PROM  Shoulder extension      Shoulder abduction   110 PROM 112 PROM  Shoulder adduction      Shoulder internal rotation      Shoulder external rotation  0 32 PROM  42 PROM  Elbow flexion      Elbow extension      Wrist flexion      Wrist extension      Wrist ulnar deviation      Wrist radial deviation      Wrist pronation      Wrist supination      (Blank rows = not tested)  UPPER EXTREMITY MMT:  wrist flexion/extension grip strength 35 L and 60 R UNABLE-- will assess once protocol allows MMT Right eval Left eval  Shoulder flexion    Shoulder extension    Shoulder abduction    Shoulder adduction    Shoulder internal rotation    Shoulder external rotation    Middle trapezius    Lower trapezius    Elbow flexion     Elbow extension    Wrist flexion    Wrist extension    Wrist ulnar deviation    Wrist radial deviation    Wrist pronation    Wrist supination    Grip strength (lbs)    (Blank rows = not tested) [facility] Adult PT Treatment:                                                DATE: 11/18/23 Therapeutic Exercise: Supine shoulder flexion AAROM with physioball x 10  Towel wall slides scaption x 10  Finger ladder flexion x 5  Standing shoulder abduction AAROM with dowel x 10  Bicep curl 2 x 10 @ 4 lbs  Updated HEP  Manual Therapy: Lt shoulder PROM within post-op guidelines   Modalities: Game ready (vaso) Lt shoulder, 34 deg, med compression x 10 min   [facility] Adult PT Treatment:                                                DATE: 11/13/23 Therapeutic Exercise: Supine shoulder flexion AAROM with RUE assist x 10  Standing shoulder towel slides at wall x 10 to approx 90 degrees  Standing ER with dowel x 10; maintaining restrictions  Standing scaption AAROM with physioball rolling on plinth x 10  Seated bicep curl 2 x 10 @ 3 lbs  Manual Therapy: Lt shoulder PROM within post-op guidelines   Modalities: Game ready (vaso) Lt shoulder, 34  deg, med compression x 10 min   [facility] Adult PT Treatment:                                                DATE: 11/11/2023 Therapeutic Exercise: Standing: Arm slides with red PB --> flexion & scaption x20 each AAROM shoulder ER with SPC 10x5 Shoulder isometrics (L): flexion, abd, ER 10x5 each Seated shoulder scaption table slides 20x3 Supine AAROM shoulder flexion with SPC 2x10 Standing AAROM scaption with SPC 2x10  Manual Therapy: (L) shoulder PROM within protocol guidelines  Modalities: Vaso (L) shoulder, 34 deg, med compression x 10 min    [facility] Adult PT Treatment:                                                DATE: 11/06/23 Therapeutic Exercise: Supine shoulder flexion AAROM with RUE x 10; 5 sec hold  Shoulder flexion, ER, and abduction isometrics x  10; 5 sec hold Shoulder abduction towel slides at table x 10 Updated HEP  Manual Therapy: Lt shoulder PROM within post-op restrictions   Modalities: Ice pack to Lt shoulder x 10 minutes  Self Care: Discontinue use of sling. ROM and lifting restrictions.    PATIENT EDUCATION: Education details: HEP update  Person educated: Patient  Education method: Explanation, demo, cues, handout Education comprehension: verbalized understanding, returned demo, cues   HOME EXERCISE PROGRAM: Access Code: [email or web address] URL: [email or web address] Date: 11/18/2023 Prepared by: Lucie Meeter  Exercises - Seated Scapular Retraction  - 1 x daily - 7 x weekly - 3 sets - 10 reps - Standing Single Arm Shoulder Flexion Towel Slide at Table Top  - 1 x daily - 7 x weekly - 1 sets - 10 reps - 5 sec  hold - Supine Shoulder External Rotation with Dowel  - 1 x daily - 7 x weekly - 1 sets - 10 reps - 5 sec  hold - Supine Shoulder Flexion AAROM with Hands Clasped  - 1 x daily - 7 x weekly - 1 sets - 10 reps - 5 sec  hold - Isometric Shoulder Flexion at Wall  - 1 x daily - 7 x weekly - 2 sets - 10 reps - Standing Isometric Shoulder External Rotation with Doorway  - 1 x daily - 7 x weekly - 2 sets - 10 reps - Standing Isometric Shoulder Abduction with Doorway - Arm Bent  - 1 x daily - 7 x weekly - 2 sets - 10 reps - Seated Shoulder Abduction Towel Slide at Table Top  - 1 x daily - 7 x weekly - 1 sets - 10 reps - 5 sec  hold - Standing Shoulder Abduction AAROM with Dowel  - 1 x daily - 7 x weekly - 1 sets - 10 reps - 5 sec  hold - Seated Single Arm Bicep Curls with Rotation and Dumbbell  - 1 x daily - 7 x weekly - 2 sets - 10 reps  ASSESSMENT:  CLINICAL IMPRESSION:  Marilyn continues to progress as expected at nearly 6 weeks post-op. Continued with AAROM/PROM activity within protocol parameters with good tolerance. With standing AAROM he requires intermittent tactile and verbal cues to reduce shoulder shrug.    OBJECTIVE IMPAIRMENTS: decreased activity  tolerance, decreased ROM, decreased strength, increased edema, increased fascial restrictions, increased muscle spasms, impaired flexibility, impaired tone, postural dysfunction, and pain.     GOALS: Goals reviewed with patient? Yes  SHORT TERM GOALS: Target date: 11/13/23  The patient will be indep with HEP. Baseline: initiated at eval Goal status: MET  2.  The patient will have PROM documented. Baseline:  began <1 week with elbow and wrist ROM + grip and neck ROM Goal status: MET   LONG TERM GOALS: Target date: 12/13/23  The patient will be indep with HEP. Baseline:  initiated at eval Goal status: progressing   2.  The patient will improve ROM to protocol ranges of flexion to 110-120, ER 30-45. Baseline: not assessed at eval Goal status: MET  3.  The patient will tolerate isometrics below shoulder level. Baseline:  not assessed at eval Goal status: progressing   4.  The patient will reach into shoulder flexion/scaption to 90 degrees to demo improved strength against gravity. Baseline: not assessed at eval. 11/13/23: deferred due to post-op restrictions  Goal status: ongoing    5.  The patient will reduce pain to < or equal to 2/10. Baseline:  5-9/10 Goal status: MET  6.  Pt will improve FOTO to >= 74 Baseline:  Goal status: progressing     PLAN:  PT FREQUENCY: 1-2x/week  PT DURATION: 8 weeks  PLANNED INTERVENTIONS: 97164- PT Re-evaluation, 97110-Therapeutic exercises, 97530- Therapeutic activity, 97112- Neuromuscular re-education, 97535- Self Care, [id number]- Manual therapy, 97014- Electrical stimulation (unattended), 97016- Vasopneumatic device, Patient/Family education, Taping, Dry Needling, Joint mobilization, Cryotherapy, and Moist heat  PLAN FOR NEXT SESSION: progress per protocol   Lucie Meeter, PT, DPT, ATC 11/18/23 10:13 AM

## 2023-11-20 ENCOUNTER — Ambulatory Visit: Payer: Medicare HMO

## 2023-11-20 DIAGNOSIS — M6281 Muscle weakness (generalized): Secondary | ICD-10-CM | POA: Diagnosis not present

## 2023-11-20 DIAGNOSIS — M25512 Pain in left shoulder: Secondary | ICD-10-CM

## 2023-11-20 DIAGNOSIS — R600 Localized edema: Secondary | ICD-10-CM

## 2023-11-20 DIAGNOSIS — R293 Abnormal posture: Secondary | ICD-10-CM

## 2023-11-20 NOTE — Therapy (Signed)
OUTPATIENT PHYSICAL THERAPY SHOULDER TREATMENT      Patient Name: Joshua [name] MRN: [id number] DOB:[birth year], 78 y.o., male Today's Date: 11/20/2023  END OF SESSION:  PT End of Session - 11/20/23 0934     Visit Number 12    Number of Visits 16    Date for PT Re-Evaluation 12/12/23    Authorization Type [employer] medicare    Authorization Time Period 12/9-12/12/23    Authorization - Visit Number 12    Authorization - Number of Visits 16    Progress Note Due on Visit 20    PT Start Time 0932    PT Stop Time 1021    PT Time Calculation (min) 49 min    Activity Tolerance Patient tolerated treatment well    Behavior During Therapy [name] for tasks assessed/performed               Past Medical History:  Diagnosis Date   Abnormal findings on diagnostic imaging of lung 09/23/2019   09/22/2019-lung cancer screening-centrilobular and paraseptal emphysema, calcified granulomata noted bilaterally, no suspicious nodules, lung RADS 1, repeat in 12 months    Anxiety    Atrial fibrillation (HCC) 09/23/2019   Centrilobular emphysema (HCC) 09/23/2019   09/22/2019-lung cancer screening-centrilobular and paraseptal emphysema, calcified granulomata noted bilaterally, no suspicious nodules, lung RADS 1, repeat in 12 months  09/23/2019-FVC 3.74 (81% predicted), postbronchodilator ratio 80, postbronchodilator FEV1 2.88 (86% predicted), no bronchodilator response, DLCO 19.44 (73% predicted)   DDD (degenerative disc disease), cervical 05/13/2008   Cervical Disc Degeneration   Depression    ETOH abuse 10/01/2023   continues to drink 1-3 drinks/ day   History of adenomatous polyp of colon 03/03/2018   05/14/17: Colonoscopy: nonadvanced adenoma, f/u 5 yrs, use SuPrep, Murphy/GAP  Last Assessment & Plan:  Relevant Hx: Course: Daily Update: Today's Plan:   History of kidney stones    Hyperlipidemia    Kidney stone    Nephrolithiasis 05/22/2011   Nontraumatic incomplete tear of right rotator  cuff 10/14/2018   Other and unspecified hyperlipidemia 11/20/2009   Hyperlipidemia   RBBB 06/09/2012   Secondary hypercoagulable state (HCC) 09/28/2019   Shortness of breath 09/23/2019   Past Surgical History:  Procedure Laterality Date   CARDIOVERSION N/A 10/15/2019   Procedure: CARDIOVERSION;  Surgeon: Jake Bathe, MD;  Location: [facility] ENDOSCOPY;  Service: Cardiovascular;  Laterality: N/A;   CARDIOVERSION N/A 11/29/2019   Procedure: CARDIOVERSION;  Surgeon: Lars Masson, MD;  Location: [facility] ENDOSCOPY;  Service: Cardiovascular;  Laterality: N/A;   HEMORRHOID SURGERY     REVERSE SHOULDER ARTHROPLASTY Left 10/09/2023   Procedure: REVERSE SHOULDER ARTHROPLASTY;  Surgeon: Bjorn Pippin, MD;  Location: [facility];  Service: Orthopedics;  Laterality: Left;   TOTAL HIP ARTHROPLASTY Right 12/21/2021   Procedure: TOTAL HIP ARTHROPLASTY ANTERIOR APPROACH;  Surgeon: Jodi Geralds, MD;  Location: [facility] ORS;  Service: Orthopedics;  Laterality: Right;   VARICOSE VEIN SURGERY Right    Patient Active Problem List   Diagnosis Date Noted   Alcohol-induced mood disorder with depressive symptoms (HCC) 06/10/2023   Benign prostatic hyperplasia without lower urinary tract symptoms 06/05/2023   Dyslipidemia 06/05/2023   Fall 06/05/2023   History of peripheral neuropathy 06/05/2023   Subdural hematoma (HCC) 06/04/2023   Pain in finger of left hand 03/12/2023   Alcohol abuse 05/31/2022   Disorder of thoracic spine 05/31/2022   COVID-19 virus infection 03/04/2022   Acute bronchitis 03/04/2022   OSA (obstructive sleep apnea) 03/04/2022  PAF (paroxysmal atrial fibrillation) (HCC) 12/13/2021   Anxiety 11/08/2021   Aphasia 11/08/2021   Bipolar 1 disorder (HCC) 11/08/2021   CKD (chronic kidney disease), stage [name] (HCC) 11/08/2021   Expressive aphasia 11/08/2021   Prolonged QT interval 11/08/2021   Recurrent falls 11/08/2021   Primary osteoarthritis of right hip 10/26/2021   Abnormal liver  function tests 09/10/2021   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   Urinary hesitancy 03/08/2021   Thoracic aortic aneurysm without rupture (HCC) 03/08/2021   Senile purpura (HCC) 09/07/2020   Aortic atherosclerosis (HCC) 09/07/2020   Sinus bradycardia 03/31/2020   Essential hypertension 03/31/2020   Major depressive disorder 03/07/2020   Essential tremor 03/07/2020   Tremor 01/21/2020   Obesity (BMI 30-39.9) 12/10/2019   Chest pain of uncertain etiology 12/10/2019   Secondary hypercoagulable state (HCC) 09/28/2019   Centrilobular emphysema (HCC) 09/23/2019   Former smoker 09/23/2019   Abnormal findings on diagnostic imaging of lung 09/23/2019   Atrial fibrillation (HCC) 09/23/2019   At risk for sleep apnea 09/23/2019   Shortness of breath 09/23/2019   Nontraumatic incomplete tear of right rotator cuff 10/14/2018   History of adenomatous polyp of colon 03/03/2018   RBBB 06/09/2012   Nephrolithiasis 05/22/2011   Mixed hyperlipidemia 11/20/2009   DDD (degenerative disc disease), cervical 05/13/2008    PCP: Everrett Coombe, DO REFERRING PROVIDER: Ramond Marrow, MD REFERRING DIAG: [phone] (ICD-10-CM) - Status post reverse total arthroplasty of left shoulder  THERAPY DIAG:  Acute pain of left shoulder  Muscle weakness (generalized)  Localized edema  Abnormal posture  Rationale for Evaluation and Treatment: Rehabilitation  ONSET DATE: 10/09/23 DATE OF SURGERY  SUBJECTIVE:                                                                                                                                                                                     SUBJECTIVE STATEMENT: Patient reports the side of the shoulder is a little sore.   Hand dominance: Right  PERTINENT HISTORY: H/o a-fib, loop recorder, ETOH use, depression  PAIN:  Are you having pain? Yes: NPRS scale: 1/10 Pain location: Lt lateral shoulder Pain description: sore Aggravating factors: movement Relieving factors:  medicine  PRECAUTIONS: Shoulder and Other: reverse total shoulder replacement  to follow protocol for reverse total shoulder  WEIGHT BEARING RESTRICTIONS: Yes < 1 lb per protocol  FALLS:  Has patient fallen in last 6 months? Yes. Number of falls 2x in the past 6 months. He has a h/o drinking and felt like gabapentin + alcohol led to falls.    LIVING ENVIRONMENT: Lives with: lives with their spouse Lives in: House/apartment Stairs:  2 to garage and 2 to deck in  back  OCCUPATION: Retired from [employer]; enjoys Chiropractor things  PLOF: Independent  PATIENT GOALS:reduced pain and improve use of the arm "as close to normal as I can get"  NEXT MD VISIT:  01/04/24  OBJECTIVE:  Note: Objective measures were completed at Evaluation unless otherwise noted.  PATIENT SURVEYS:  FOTO 44 11/04/23: 44%  11/13/23: 58% function   EDEMA: Pocket of edema in distal medial arm, bruising in proximal biceps bruising and edema noted dressing in place  POSTURE: Rounded shoulders and forward head position  UPPER EXTREMITY ROM:   will assess next visit (< 1 week-- focused on elbow, wrist, positioning today) Passive ROM Right eval Left 12/13 11/06/23 Left  11/13/23 Left   Shoulder flexion  75 140 PROM  135 PROM  Shoulder extension      Shoulder abduction   110 PROM 112 PROM  Shoulder adduction      Shoulder internal rotation      Shoulder external rotation  0 32 PROM  42 PROM  Elbow flexion      Elbow extension      Wrist flexion      Wrist extension      Wrist ulnar deviation      Wrist radial deviation      Wrist pronation      Wrist supination      (Blank rows = not tested)  UPPER EXTREMITY MMT:  wrist flexion/extension grip strength 35 L and 60 R UNABLE-- will assess once protocol allows MMT Right eval Left eval  Shoulder flexion    Shoulder extension    Shoulder abduction    Shoulder adduction    Shoulder internal rotation    Shoulder external rotation    Middle trapezius    Lower  trapezius    Elbow flexion    Elbow extension    Wrist flexion    Wrist extension    Wrist ulnar deviation    Wrist radial deviation    Wrist pronation    Wrist supination    Grip strength (lbs)    (Blank rows = not tested) [facility] Adult PT Treatment:                                                DATE: 11/20/23 Therapeutic Exercise: Supine shoulder flexion AROM x 10  Supine shoulder ER AROM x 10  Supine shoulder scaption AROM x 10  Updated HEP  Manual Therapy: Lt shoulder PROM flexion, abduction, and ER to tolerance with stretching at available end range  Modalities: Game ready (vaso) Lt shoulder, 34 deg, med compression x 10 min    [facility] Adult PT Treatment:                                                DATE: 11/18/23 Therapeutic Exercise: Supine shoulder flexion AAROM with physioball x 10  Towel wall slides scaption x 10  Finger ladder flexion x 5  Standing shoulder abduction AAROM with dowel x 10  Bicep curl 2 x 10 @ 4 lbs  Updated HEP  Manual Therapy: Lt shoulder PROM within post-op guidelines   Modalities: Game ready (vaso) Lt shoulder, 34 deg, med compression x 10 min   [facility] Adult PT Treatment:  DATE: 11/13/23 Therapeutic Exercise: Supine shoulder flexion AAROM with RUE assist x 10  Standing shoulder towel slides at wall x 10 to approx 90 degrees  Standing ER with dowel x 10; maintaining restrictions  Standing scaption AAROM with physioball rolling on plinth x 10  Seated bicep curl 2 x 10 @ 3 lbs  Manual Therapy: Lt shoulder PROM within post-op guidelines   Modalities: Game ready (vaso) Lt shoulder, 34 deg, med compression x 10 min     PATIENT EDUCATION: Education details: HEP update  Person educated: Patient  Education method: Explanation, demo, cues, handout Education comprehension: verbalized understanding, returned demo, cues   HOME EXERCISE PROGRAM: Access Code: [email or web address] URL:  [email or web address] Date: 11/20/2023 Prepared by: Letitia Libra  Exercises - Seated Scapular Retraction  - 1 x daily - 7 x weekly - 3 sets - 10 reps - Supine Shoulder External Rotation with Dowel  - 1 x daily - 7 x weekly - 1 sets - 10 reps - 5 sec  hold - Supine Shoulder Flexion AAROM with Hands Clasped  - 1 x daily - 7 x weekly - 1 sets - 10 reps - 5 sec  hold - Seated Shoulder Abduction Towel Slide at Table Top  - 1 x daily - 7 x weekly - 1 sets - 10 reps - 5 sec  hold - Standing Shoulder Abduction AAROM with Dowel  - 1 x daily - 7 x weekly - 1 sets - 10 reps - 5 sec  hold - Seated Single Arm Bicep Curls with Rotation and Dumbbell  - 1 x daily - 7 x weekly - 2 sets - 10 reps - Supine Shoulder Flexion Extension Full Range AROM  - 1 x daily - 7 x weekly - 1 sets - 10 reps - Supine Single Arm Shoulder External Rotation AROM  - 1 x daily - 7 x weekly - 1 sets - 10 reps - Isometric Shoulder Flexion at Wall  - 1 x daily - 7 x weekly - 2 sets - 10 reps - Standing Isometric Shoulder External Rotation with Doorway  - 1 x daily - 7 x weekly - 2 sets - 10 reps - Standing Isometric Shoulder Abduction with Doorway - Arm Bent  - 1 x daily - 7 x weekly - 2 sets - 10 reps  ASSESSMENT:  CLINICAL IMPRESSION:  Progressed per protocol as patient is 6 weeks post-op, introducing shoulder AROM in gravity eliminated positioning today with good tolerance. Reported mild pain at available end range of PROM that improved with stretching at available end range.   OBJECTIVE IMPAIRMENTS: decreased activity tolerance, decreased ROM, decreased strength, increased edema, increased fascial restrictions, increased muscle spasms, impaired flexibility, impaired tone, postural dysfunction, and pain.     GOALS: Goals reviewed with patient? Yes  SHORT TERM GOALS: Target date: 11/13/23  The patient will be indep with HEP. Baseline: initiated at eval Goal status: MET  2.  The patient will have PROM  documented. Baseline:  began <1 week with elbow and wrist ROM + grip and neck ROM Goal status: MET   LONG TERM GOALS: Target date: 12/13/23  The patient will be indep with HEP. Baseline:  initiated at eval Goal status: progressing   2.  The patient will improve ROM to protocol ranges of flexion to 110-120, ER 30-45. Baseline: not assessed at eval Goal status: MET  3.  The patient will tolerate isometrics below shoulder level. Baseline:  not assessed at eval Goal status: progressing   4.  The patient will reach into shoulder flexion/scaption to 90 degrees to demo improved strength against gravity. Baseline: not assessed at eval. 11/13/23: deferred due to post-op restrictions  Goal status: ongoing    5.  The patient will reduce pain to < or equal to 2/10. Baseline:  5-9/10 Goal status: MET  6.  Pt will improve FOTO to >= 74 Baseline:  Goal status: progressing     PLAN:  PT FREQUENCY: 1-2x/week  PT DURATION: 8 weeks  PLANNED INTERVENTIONS: 97164- PT Re-evaluation, 97110-Therapeutic exercises, 97530- Therapeutic activity, 97112- Neuromuscular re-education, 97535- Self Care, 16109- Manual therapy, 97014- Electrical stimulation (unattended), 97016- Vasopneumatic device, Patient/Family education, Taping, Dry Needling, Joint mobilization, Cryotherapy, and Moist heat  PLAN FOR NEXT SESSION: progress per protocol   Letitia Libra, PT, DPT, ATC 11/20/23 10:15 AM

## 2023-11-20 NOTE — Telephone Encounter (Signed)
Pt did not call back to set up appt  Closing encounter

## 2023-11-21 DIAGNOSIS — R55 Syncope and collapse: Secondary | ICD-10-CM | POA: Diagnosis not present

## 2023-11-21 DIAGNOSIS — Z95818 Presence of other cardiac implants and grafts: Secondary | ICD-10-CM | POA: Diagnosis not present

## 2023-11-25 ENCOUNTER — Ambulatory Visit: Payer: Medicare HMO

## 2023-11-25 DIAGNOSIS — M6281 Muscle weakness (generalized): Secondary | ICD-10-CM | POA: Diagnosis not present

## 2023-11-25 DIAGNOSIS — R600 Localized edema: Secondary | ICD-10-CM

## 2023-11-25 DIAGNOSIS — M25512 Pain in left shoulder: Secondary | ICD-10-CM | POA: Diagnosis not present

## 2023-11-25 DIAGNOSIS — R293 Abnormal posture: Secondary | ICD-10-CM

## 2023-11-25 NOTE — Therapy (Signed)
OUTPATIENT PHYSICAL THERAPY SHOULDER TREATMENT      Patient Name: Joshua [name] [facility] MRN: [id number] DOB:[birth year], 78 y.o., male Today's Date: 11/25/2023  END OF SESSION:  PT End of Session - 11/25/23 0932     Visit Number 13    Number of Visits 16    Date for PT Re-Evaluation 12/12/23    Authorization Type [employer] medicare    Authorization Time Period 12/9-12/12/23    Authorization - Visit Number 13    Authorization - Number of Visits 16    Progress Note Due on Visit 20    PT Start Time 0932    PT Stop Time 1023    PT Time Calculation (min) 51 min    Activity Tolerance Patient tolerated treatment well    Behavior During Therapy [facility] for tasks assessed/performed                Past Medical History:  Diagnosis Date   Abnormal findings on diagnostic imaging of lung 09/23/2019   09/22/2019-lung cancer screening-centrilobular and paraseptal emphysema, calcified granulomata noted bilaterally, no suspicious nodules, lung RADS 1, repeat in 12 months    Anxiety    Atrial fibrillation (HCC) 09/23/2019   Centrilobular emphysema (HCC) 09/23/2019   09/22/2019-lung cancer screening-centrilobular and paraseptal emphysema, calcified granulomata noted bilaterally, no suspicious nodules, lung RADS 1, repeat in 12 months  09/23/2019-FVC 3.74 (81% predicted), postbronchodilator ratio 80, postbronchodilator FEV1 2.88 (86% predicted), no bronchodilator response, DLCO 19.44 (73% predicted)   DDD (degenerative disc disease), cervical 05/13/2008   Cervical Disc Degeneration   Depression    ETOH abuse 10/01/2023   continues to drink 1-3 drinks/ day   History of adenomatous polyp of colon 03/03/2018   05/14/17: Colonoscopy: nonadvanced adenoma, f/u 5 yrs, use SuPrep, Murphy/GAP  Last Assessment & Plan:  Relevant Hx: Course: Daily Update: Today's Plan:   History of kidney stones    Hyperlipidemia    Kidney stone    Nephrolithiasis 05/22/2011   Nontraumatic incomplete tear of right rotator  cuff 10/14/2018   Other and unspecified hyperlipidemia 11/20/2009   Hyperlipidemia   RBBB 06/09/2012   Secondary hypercoagulable state (HCC) 09/28/2019   Shortness of breath 09/23/2019   Past Surgical History:  Procedure Laterality Date   CARDIOVERSION N/A 10/15/2019   Procedure: CARDIOVERSION;  Surgeon: Jake Bathe, MD;  Location: [facility] ENDOSCOPY;  Service: Cardiovascular;  Laterality: N/A;   CARDIOVERSION N/A 11/29/2019   Procedure: CARDIOVERSION;  Surgeon: Lars Masson, MD;  Location: [facility] ENDOSCOPY;  Service: Cardiovascular;  Laterality: N/A;   HEMORRHOID SURGERY     REVERSE SHOULDER ARTHROPLASTY Left 10/09/2023   Procedure: REVERSE SHOULDER ARTHROPLASTY;  Surgeon: Bjorn Pippin, MD;  Location: [facility];  Service: Orthopedics;  Laterality: Left;   TOTAL HIP ARTHROPLASTY Right 12/21/2021   Procedure: TOTAL HIP ARTHROPLASTY ANTERIOR APPROACH;  Surgeon: Jodi Geralds, MD;  Location: [facility] ORS;  Service: Orthopedics;  Laterality: Right;   VARICOSE VEIN SURGERY Right    Patient Active Problem List   Diagnosis Date Noted   Alcohol-induced mood disorder with depressive symptoms (HCC) 06/10/2023   Benign prostatic hyperplasia without lower urinary tract symptoms 06/05/2023   Dyslipidemia 06/05/2023   Fall 06/05/2023   History of peripheral neuropathy 06/05/2023   Subdural hematoma (HCC) 06/04/2023   Pain in finger of left hand 03/12/2023   Alcohol abuse 05/31/2022   Disorder of thoracic spine 05/31/2022   COVID-19 virus infection 03/04/2022   Acute bronchitis 03/04/2022   OSA (obstructive sleep apnea) 03/04/2022  PAF (paroxysmal atrial fibrillation) (HCC) 12/13/2021   Anxiety 11/08/2021   Aphasia 11/08/2021   Bipolar 1 disorder (HCC) 11/08/2021   CKD (chronic kidney disease), stage III (HCC) 11/08/2021   Expressive aphasia 11/08/2021   Prolonged QT interval 11/08/2021   Recurrent falls 11/08/2021   Primary osteoarthritis of right hip 10/26/2021   Abnormal liver  function tests 09/10/2021   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   Urinary hesitancy 03/08/2021   Thoracic aortic aneurysm without rupture (HCC) 03/08/2021   Senile purpura (HCC) 09/07/2020   Aortic atherosclerosis (HCC) 09/07/2020   Sinus bradycardia 03/31/2020   Essential hypertension 03/31/2020   Major depressive disorder 03/07/2020   Essential tremor 03/07/2020   Tremor 01/21/2020   Obesity (BMI 30-39.9) 12/10/2019   Chest pain of uncertain etiology 12/10/2019   Secondary hypercoagulable state (HCC) 09/28/2019   Centrilobular emphysema (HCC) 09/23/2019   Former smoker 09/23/2019   Abnormal findings on diagnostic imaging of lung 09/23/2019   Atrial fibrillation (HCC) 09/23/2019   At risk for sleep apnea 09/23/2019   Shortness of breath 09/23/2019   Nontraumatic incomplete tear of right rotator cuff 10/14/2018   History of adenomatous polyp of colon 03/03/2018   RBBB 06/09/2012   Nephrolithiasis 05/22/2011   Mixed hyperlipidemia 11/20/2009   DDD (degenerative disc disease), cervical 05/13/2008    PCP: Everrett Coombe, DO REFERRING PROVIDER: Ramond Marrow, MD REFERRING DIAG: [phone] (ICD-10-CM) - Status post reverse total arthroplasty of left shoulder  THERAPY DIAG:  Acute pain of left shoulder  Muscle weakness (generalized)  Localized edema  Abnormal posture  Rationale for Evaluation and Treatment: Rehabilitation  ONSET DATE: 10/09/23 DATE OF SURGERY  SUBJECTIVE:                                                                                                                                                                                     SUBJECTIVE STATEMENT: Patient reports no pain right now. Does have some pain at end range of ROM exercises as part of HEP, but doesn't push past this.   Hand dominance: Right  PERTINENT HISTORY: H/o a-fib, loop recorder, ETOH use, depression  PAIN:  Are you having pain? Yes: NPRS scale: none currently; 6 at worst/10 Pain location: Lt lateral  shoulder Pain description: sore Aggravating factors: quick movement Relieving factors: medicine  PRECAUTIONS: Shoulder and Other: reverse total shoulder replacement  to follow protocol for reverse total shoulder  WEIGHT BEARING RESTRICTIONS: Yes < 1 lb per protocol  FALLS:  Has patient fallen in last 6 months? Yes. Number of falls 2x in the past 6 months. He has a h/o drinking and felt like gabapentin + alcohol led to falls.    LIVING ENVIRONMENT:  Lives with: lives with their spouse Lives in: House/apartment Stairs:  2 to garage and 2 to deck in back  OCCUPATION: Retired from [employer]; enjoys Chiropractor things  PLOF: Independent  PATIENT GOALS:reduced pain and improve use of the arm "as close to normal as I can get"  NEXT MD VISIT:  01/04/24  OBJECTIVE:  Note: Objective measures were completed at Evaluation unless otherwise noted.  PATIENT SURVEYS:  FOTO 44 11/04/23: 44%  11/13/23: 58% function   EDEMA: Pocket of edema in distal medial arm, bruising in proximal biceps bruising and edema noted dressing in place  POSTURE: Rounded shoulders and forward head position  UPPER EXTREMITY ROM:   will assess next visit (< 1 week-- focused on elbow, wrist, positioning today) Passive ROM Right eval Left 12/13 11/06/23 Left  11/13/23 Left  11/25/23 Left   Shoulder flexion  75 140 PROM  135 PROM 136 AROM  Shoulder extension       Shoulder abduction   110 PROM 112 PROM   Shoulder adduction       Shoulder internal rotation       Shoulder external rotation  0 32 PROM  42 PROM 45 PROM  Elbow flexion       Elbow extension       Wrist flexion       Wrist extension       Wrist ulnar deviation       Wrist radial deviation       Wrist pronation       Wrist supination       (Blank rows = not tested)  UPPER EXTREMITY MMT:  wrist flexion/extension grip strength 35 L and 60 R UNABLE-- will assess once protocol allows MMT Right eval Left eval  Shoulder flexion    Shoulder extension     Shoulder abduction    Shoulder adduction    Shoulder internal rotation    Shoulder external rotation    Middle trapezius    Lower trapezius    Elbow flexion    Elbow extension    Wrist flexion    Wrist extension    Wrist ulnar deviation    Wrist radial deviation    Wrist pronation    Wrist supination    Grip strength (lbs)    (Blank rows = not tested) [facility] Adult PT Treatment:                                                DATE: 11/25/23 Therapeutic Exercise: Supine shoulder flexion AROM x 10  Sidelying shoulder abduction AROM (approx 90 degrees) x 10  Standing ER AAROM with dowel x 10; 5 sec hold  Reviewed and updated HEP  Manual Therapy: Lt shoulder PROM flexion, abduction, and ER to tolerance with stretching at available end range  Modalities: Game ready (vaso) Lt shoulder, 34 deg, med compression x 10 min    [facility] Adult PT Treatment:                                                DATE: 11/20/23 Therapeutic Exercise: Supine shoulder flexion AROM x 10  Supine shoulder ER AROM x 10  Supine shoulder scaption AROM x 10  Updated HEP  Manual Therapy:  Lt shoulder PROM flexion, abduction, and ER to tolerance with stretching at available end range  Modalities: Game ready (vaso) Lt shoulder, 34 deg, med compression x 10 min    [facility] Adult PT Treatment:                                                DATE: 11/18/23 Therapeutic Exercise: Supine shoulder flexion AAROM with physioball x 10  Towel wall slides scaption x 10  Finger ladder flexion x 5  Standing shoulder abduction AAROM with dowel x 10  Bicep curl 2 x 10 @ 4 lbs  Updated HEP  Manual Therapy: Lt shoulder PROM within post-op guidelines   Modalities: Game ready (vaso) Lt shoulder, 34 deg, med compression x 10 min   [facility] Adult PT Treatment:                                                DATE: 11/13/23 Therapeutic Exercise: Supine shoulder flexion AAROM with RUE assist x 10  Standing shoulder towel slides at wall x  10 to approx 90 degrees  Standing ER with dowel x 10; maintaining restrictions  Standing scaption AAROM with physioball rolling on plinth x 10  Seated bicep curl 2 x 10 @ 3 lbs  Manual Therapy: Lt shoulder PROM within post-op guidelines   Modalities: Game ready (vaso) Lt shoulder, 34 deg, med compression x 10 min     PATIENT EDUCATION: Education details: HEP update  Person educated: Patient  Education method: Explanation, demo, cues, handout Education comprehension: verbalized understanding, returned demo, cues   HOME EXERCISE PROGRAM: Access Code: [email or web address] URL: [email or web address] Date: 11/25/2023 Prepared by: Letitia Libra  Exercises - Seated Scapular Retraction  - 1 x daily - 7 x weekly - 3 sets - 10 reps - Supine Shoulder Flexion AAROM with Hands Clasped  - 1 x daily - 7 x weekly - 1 sets - 10 reps - 5 sec  hold - Seated Shoulder Abduction Towel Slide at Table Top  - 1 x daily - 7 x weekly - 1 sets - 10 reps - 5 sec  hold - Standing Shoulder Abduction AAROM with Dowel  - 1 x daily - 7 x weekly - 1 sets - 10 reps - 5 sec  hold - Standing Shoulder External Rotation AAROM with Dowel  - 1 x daily - 7 x weekly - 1 sets - 10 reps - 5 sec  hold - Seated Single Arm Bicep Curls with Rotation and Dumbbell  - 1 x daily - 7 x weekly - 2 sets - 10 reps - Supine Shoulder Flexion Extension Full Range AROM  - 1 x daily - 7 x weekly - 1 sets - 10 reps - Supine Single Arm Shoulder External Rotation AROM  - 1 x daily - 7 x weekly - 1 sets - 10 reps - Standing Isometric Shoulder External Rotation with Doorway  - 1 x daily - 7 x weekly - 2 sets - 10 reps - Sidelying Shoulder Abduction  - 1 x daily - 7 x weekly - 2 sets - 10 reps  ASSESSMENT:  CLINICAL IMPRESSION:  Continued with Lt shoulder ROM progression per protocol with good tolerance. PROM is limited secondary to pain  with most notable restriction in abduction and ER. Slight improvement in ER PROM compared to previous  measurement. Updated HEP to include further AAROM/AROM activity.   OBJECTIVE IMPAIRMENTS: decreased activity tolerance, decreased ROM, decreased strength, increased edema, increased fascial restrictions, increased muscle spasms, impaired flexibility, impaired tone, postural dysfunction, and pain.     GOALS: Goals reviewed with patient? Yes  SHORT TERM GOALS: Target date: 11/13/23  The patient will be indep with HEP. Baseline: initiated at eval Goal status: MET  2.  The patient will have PROM documented. Baseline:  began <1 week with elbow and wrist ROM + grip and neck ROM Goal status: MET   LONG TERM GOALS: Target date: 12/13/23  The patient will be indep with HEP. Baseline:  initiated at eval Goal status: progressing   2.  The patient will improve ROM to protocol ranges of flexion to 110-120, ER 30-45. Baseline: not assessed at eval Goal status: MET  3.  The patient will tolerate isometrics below shoulder level. Baseline:  not assessed at eval Goal status: progressing   4.  The patient will reach into shoulder flexion/scaption to 90 degrees to demo improved strength against gravity. Baseline: not assessed at eval. 11/13/23: deferred due to post-op restrictions  Goal status: ongoing    5.  The patient will reduce pain to < or equal to 2/10. Baseline:  5-9/10 Goal status: MET  6.  Pt will improve FOTO to >= 74 Baseline:  Goal status: progressing     PLAN:  PT FREQUENCY: 1-2x/week  PT DURATION: 8 weeks  PLANNED INTERVENTIONS: 97164- PT Re-evaluation, 97110-Therapeutic exercises, 97530- Therapeutic activity, 97112- Neuromuscular re-education, 97535- Self Care, 29528- Manual therapy, 97014- Electrical stimulation (unattended), 97016- Vasopneumatic device, Patient/Family education, Taping, Dry Needling, Joint mobilization, Cryotherapy, and Moist heat  PLAN FOR NEXT SESSION: progress per protocol   Letitia Libra, PT, DPT, ATC 11/25/23 10:15 AM

## 2023-11-27 ENCOUNTER — Ambulatory Visit: Payer: Medicare HMO

## 2023-11-27 DIAGNOSIS — R293 Abnormal posture: Secondary | ICD-10-CM

## 2023-11-27 DIAGNOSIS — R600 Localized edema: Secondary | ICD-10-CM | POA: Diagnosis not present

## 2023-11-27 DIAGNOSIS — M25512 Pain in left shoulder: Secondary | ICD-10-CM

## 2023-11-27 DIAGNOSIS — M6281 Muscle weakness (generalized): Secondary | ICD-10-CM

## 2023-11-27 NOTE — Therapy (Signed)
OUTPATIENT PHYSICAL THERAPY SHOULDER TREATMENT      Patient Name: Joshua [name] MRN: [id number] DOB:1946/08/20, 78 y.o., male Today's Date: 11/27/2023  END OF SESSION:  PT End of Session - 11/27/23 0929     Visit Number 14    Number of Visits 16    Date for PT Re-Evaluation 12/12/23    Authorization Type [employer] medicare    Authorization Time Period 12/9-12/12/23    Authorization - Visit Number 14    Authorization - Number of Visits 16    Progress Note Due on Visit 20    PT Start Time 0929    PT Stop Time 1019    PT Time Calculation (min) 50 min    Activity Tolerance Patient tolerated treatment well    Behavior During Therapy [facility] for tasks assessed/performed                 Past Medical History:  Diagnosis Date   Abnormal findings on diagnostic imaging of lung 09/23/2019   09/22/2019-lung cancer screening-centrilobular and paraseptal emphysema, calcified granulomata noted bilaterally, no suspicious nodules, lung RADS 1, repeat in 12 months    Anxiety    Atrial fibrillation (HCC) 09/23/2019   Centrilobular emphysema (HCC) 09/23/2019   09/22/2019-lung cancer screening-centrilobular and paraseptal emphysema, calcified granulomata noted bilaterally, no suspicious nodules, lung RADS 1, repeat in 12 months  09/23/2019-FVC 3.74 (81% predicted), postbronchodilator ratio 80, postbronchodilator FEV1 2.88 (86% predicted), no bronchodilator response, DLCO 19.44 (73% predicted)   DDD (degenerative disc disease), cervical 05/13/2008   Cervical Disc Degeneration   Depression    ETOH abuse 10/01/2023   continues to drink 1-3 drinks/ day   History of adenomatous polyp of colon 03/03/2018   05/14/17: Colonoscopy: nonadvanced adenoma, f/u 5 yrs, use SuPrep, Murphy/GAP  Last Assessment & Plan:  Relevant Hx: Course: Daily Update: Today's Plan:   History of kidney stones    Hyperlipidemia    Kidney stone    Nephrolithiasis 05/22/2011   Nontraumatic incomplete tear of right  rotator cuff 10/14/2018   Other and unspecified hyperlipidemia 11/20/2009   Hyperlipidemia   RBBB 06/09/2012   Secondary hypercoagulable state (HCC) 09/28/2019   Shortness of breath 09/23/2019   Past Surgical History:  Procedure Laterality Date   CARDIOVERSION N/A 10/15/2019   Procedure: CARDIOVERSION;  Surgeon: Jake Bathe, MD;  Location: [facility] ENDOSCOPY;  Service: Cardiovascular;  Laterality: N/A;   CARDIOVERSION N/A 11/29/2019   Procedure: CARDIOVERSION;  Surgeon: Lars Masson, MD;  Location: [facility] ENDOSCOPY;  Service: Cardiovascular;  Laterality: N/A;   HEMORRHOID SURGERY     REVERSE SHOULDER ARTHROPLASTY Left 10/09/2023   Procedure: REVERSE SHOULDER ARTHROPLASTY;  Surgeon: Bjorn Pippin, MD;  Location: [facility];  Service: Orthopedics;  Laterality: Left;   TOTAL HIP ARTHROPLASTY Right 12/21/2021   Procedure: TOTAL HIP ARTHROPLASTY ANTERIOR APPROACH;  Surgeon: Jodi Geralds, MD;  Location: [facility] ORS;  Service: Orthopedics;  Laterality: Right;   VARICOSE VEIN SURGERY Right    Patient Active Problem List   Diagnosis Date Noted   Alcohol-induced mood disorder with depressive symptoms (HCC) 06/10/2023   Benign prostatic hyperplasia without lower urinary tract symptoms 06/05/2023   Dyslipidemia 06/05/2023   Fall 06/05/2023   History of peripheral neuropathy 06/05/2023   Subdural hematoma (HCC) 06/04/2023   Pain in finger of left hand 03/12/2023   Alcohol abuse 05/31/2022   Disorder of thoracic spine 05/31/2022   COVID-19 virus infection 03/04/2022   Acute bronchitis 03/04/2022   OSA (obstructive sleep apnea)  03/04/2022   PAF (paroxysmal atrial fibrillation) (HCC) 12/13/2021   Anxiety 11/08/2021   Aphasia 11/08/2021   Bipolar 1 disorder (HCC) 11/08/2021   CKD (chronic kidney disease), stage III (HCC) 11/08/2021   Expressive aphasia 11/08/2021   Prolonged QT interval 11/08/2021   Recurrent falls 11/08/2021   Primary osteoarthritis of right hip 10/26/2021    Abnormal liver function tests 09/10/2021   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   Urinary hesitancy 03/08/2021   Thoracic aortic aneurysm without rupture (HCC) 03/08/2021   Senile purpura (HCC) 09/07/2020   Aortic atherosclerosis (HCC) 09/07/2020   Sinus bradycardia 03/31/2020   Essential hypertension 03/31/2020   Major depressive disorder 03/07/2020   Essential tremor 03/07/2020   Tremor 01/21/2020   Obesity (BMI 30-39.9) 12/10/2019   Chest pain of uncertain etiology 12/10/2019   Secondary hypercoagulable state (HCC) 09/28/2019   Centrilobular emphysema (HCC) 09/23/2019   Former smoker 09/23/2019   Abnormal findings on diagnostic imaging of lung 09/23/2019   Atrial fibrillation (HCC) 09/23/2019   At risk for sleep apnea 09/23/2019   Shortness of breath 09/23/2019   Nontraumatic incomplete tear of right rotator cuff 10/14/2018   History of adenomatous polyp of colon 03/03/2018   RBBB 06/09/2012   Nephrolithiasis 05/22/2011   Mixed hyperlipidemia 11/20/2009   DDD (degenerative disc disease), cervical 05/13/2008    PCP: Everrett Coombe, DO REFERRING PROVIDER: Ramond Marrow, MD REFERRING DIAG: [phone] (ICD-10-CM) - Status post reverse total arthroplasty of left shoulder  THERAPY DIAG:  Acute pain of left shoulder  Muscle weakness (generalized)  Localized edema  Abnormal posture  Rationale for Evaluation and Treatment: Rehabilitation  ONSET DATE: 10/09/23 DATE OF SURGERY  SUBJECTIVE:                                                                                                                                                                                     SUBJECTIVE STATEMENT: Patient had a little soreness on the drive over here, but no soreness now.   Hand dominance: Right  PERTINENT HISTORY: H/o a-fib, loop recorder, ETOH use, depression  PAIN:  Are you having pain? Yes: NPRS scale: none currently; 6 at worst/10 Pain location: Lt lateral shoulder Pain description:  sore Aggravating factors: quick movement Relieving factors: medicine  PRECAUTIONS: Shoulder and Other: reverse total shoulder replacement  to follow protocol for reverse total shoulder  WEIGHT BEARING RESTRICTIONS: Yes < 1 lb per protocol  FALLS:  Has patient fallen in last 6 months? Yes. Number of falls 2x in the past 6 months. He has a h/o drinking and felt like gabapentin + alcohol led to falls.    LIVING ENVIRONMENT: Lives with: lives with their spouse Lives in:  House/apartment Stairs:  2 to garage and 2 to deck in back  OCCUPATION: Retired from [employer]; enjoys Chiropractor things  PLOF: Independent  PATIENT GOALS:reduced pain and improve use of the arm "as close to normal as I can get"  NEXT MD VISIT:  01/04/24  OBJECTIVE:  Note: Objective measures were completed at Evaluation unless otherwise noted.  PATIENT SURVEYS:  FOTO 44 11/04/23: 44%  11/13/23: 58% function   EDEMA: Pocket of edema in distal medial arm, bruising in proximal biceps bruising and edema noted dressing in place  POSTURE: Rounded shoulders and forward head position  UPPER EXTREMITY ROM:   will assess next visit (< 1 week-- focused on elbow, wrist, positioning today) Passive ROM Right eval Left 12/13 11/06/23 Left  11/13/23 Left  11/25/23 Left  11/27/23 Left   Shoulder flexion  75 140 PROM  135 PROM 136 AROM   Shoulder extension        Shoulder abduction   110 PROM 112 PROM    Shoulder adduction        Shoulder internal rotation        Shoulder external rotation  0 32 PROM  42 PROM 45 PROM 56 PROM  Elbow flexion        Elbow extension        Wrist flexion        Wrist extension        Wrist ulnar deviation        Wrist radial deviation        Wrist pronation        Wrist supination        (Blank rows = not tested)  UPPER EXTREMITY MMT:  wrist flexion/extension grip strength 35 L and 60 R UNABLE-- will assess once protocol allows MMT Right eval Left eval  Shoulder flexion    Shoulder  extension    Shoulder abduction    Shoulder adduction    Shoulder internal rotation    Shoulder external rotation    Middle trapezius    Lower trapezius    Elbow flexion    Elbow extension    Wrist flexion    Wrist extension    Wrist ulnar deviation    Wrist radial deviation    Wrist pronation    Wrist supination    Grip strength (lbs)    (Blank rows = not tested) [facility] Adult PT Treatment:                                                DATE: 11/27/23 Therapeutic Exercise: Pulleys flexion/scaption x 2 min each  Sidelying ER x 10  Inclined shoulder flexion/scaption AROM; 30 degrees inclined; x 10  Updated HEP  Manual Therapy: Lt shoulder PROM flexion, abduction, and ER to tolerance with stretching at available end range STM Lt deltoid, bicep brachii d  Modalities: Game ready (vaso) Lt shoulder, 34 deg, med compression x 10 min    [facility] Adult PT Treatment:                                                DATE: 11/25/23 Therapeutic Exercise: Supine shoulder flexion AROM x 10  Sidelying shoulder abduction AROM (approx 90 degrees) x 10  Standing  ER AAROM with dowel x 10; 5 sec hold  Reviewed and updated HEP  Manual Therapy: Lt shoulder PROM flexion, abduction, and ER to tolerance with stretching at available end range  Modalities: Game ready (vaso) Lt shoulder, 34 deg, med compression x 10 min    [facility] Adult PT Treatment:                                                DATE: 11/20/23 Therapeutic Exercise: Supine shoulder flexion AROM x 10  Supine shoulder ER AROM x 10  Supine shoulder scaption AROM x 10  Updated HEP  Manual Therapy: Lt shoulder PROM flexion, abduction, and ER to tolerance with stretching at available end range  Modalities: Game ready (vaso) Lt shoulder, 34 deg, med compression x 10 min      PATIENT EDUCATION: Education details: HEP update  Person educated: Patient  Education method: Explanation, demo, cues, handout Education comprehension:  verbalized understanding, returned demo, cues   HOME EXERCISE PROGRAM: Access Code: [email or web address] URL: [email or web address] Date: 11/27/2023 Prepared by: Letitia Libra  Exercises - Seated Scapular Retraction  - 1 x daily - 7 x weekly - 3 sets - 10 reps - Supine Shoulder Flexion AAROM with Hands Clasped  - 1 x daily - 7 x weekly - 1 sets - 10 reps - 5 sec  hold - Seated Shoulder Abduction Towel Slide at Table Top  - 1 x daily - 7 x weekly - 1 sets - 10 reps - 5 sec  hold - Standing Shoulder Abduction AAROM with Dowel  - 1 x daily - 7 x weekly - 1 sets - 10 reps - 5 sec  hold - Standing Shoulder External Rotation AAROM with Dowel  - 1 x daily - 7 x weekly - 1 sets - 10 reps - 5 sec  hold - Seated Single Arm Bicep Curls with Rotation and Dumbbell  - 1 x daily - 7 x weekly - 2 sets - 10 reps - Supine Shoulder Flexion Extension Full Range AROM  - 1 x daily - 7 x weekly - 1 sets - 10 reps - Supine Single Arm Shoulder External Rotation AROM  - 1 x daily - 7 x weekly - 1 sets - 10 reps - Standing Isometric Shoulder External Rotation with Doorway  - 1 x daily - 7 x weekly - 2 sets - 10 reps - Sidelying Shoulder Abduction  - 1 x daily - 7 x weekly - 2 sets - 10 reps - Seated Shoulder Scaption AAROM with Pulley at Side  - 1 x daily - 7 x weekly - 2 sets - 10 reps  ASSESSMENT:  CLINICAL IMPRESSION:  Ibin continues to progress well in PT. Improvement noted in Lt ER PROM compared to previous measurements. Introduced pulleys today with good tolerance and this was added to HEP as patient reports having pulleys at home. Progressed AROM into gravity dependent positioning with good tolerance. Able to complete shoulder flexion/scaption AROM through partial range on incline without shoulder shrug present. He reported fatigue in shoulder at conclusion, but no pain.   OBJECTIVE IMPAIRMENTS: decreased activity tolerance, decreased ROM, decreased strength, increased edema, increased fascial restrictions,  increased muscle spasms, impaired flexibility, impaired tone, postural dysfunction, and pain.     GOALS: Goals reviewed with patient? Yes  SHORT TERM GOALS: Target date: 11/13/23  The patient will  be indep with HEP. Baseline: initiated at eval Goal status: MET  2.  The patient will have PROM documented. Baseline:  began <1 week with elbow and wrist ROM + grip and neck ROM Goal status: MET   LONG TERM GOALS: Target date: 12/13/23  The patient will be indep with HEP. Baseline:  initiated at eval Goal status: progressing   2.  The patient will improve ROM to protocol ranges of flexion to 110-120, ER 30-45. Baseline: not assessed at eval Goal status: MET  3.  The patient will tolerate isometrics below shoulder level. Baseline:  not assessed at eval Goal status: progressing   4.  The patient will reach into shoulder flexion/scaption to 90 degrees to demo improved strength against gravity. Baseline: not assessed at eval. 11/13/23: deferred due to post-op restrictions  Goal status: ongoing    5.  The patient will reduce pain to < or equal to 2/10. Baseline:  5-9/10 Goal status: MET  6.  Pt will improve FOTO to >= 74 Baseline:  Goal status: progressing     PLAN:  PT FREQUENCY: 1-2x/week  PT DURATION: 8 weeks  PLANNED INTERVENTIONS: 97164- PT Re-evaluation, 97110-Therapeutic exercises, 97530- Therapeutic activity, 97112- Neuromuscular re-education, 97535- Self Care, 60454- Manual therapy, 97014- Electrical stimulation (unattended), 97016- Vasopneumatic device, Patient/Family education, Taping, Dry Needling, Joint mobilization, Cryotherapy, and Moist heat  PLAN FOR NEXT SESSION: progress per protocol   Letitia Libra, PT, DPT, ATC 11/27/23 10:10 AM

## 2023-12-01 ENCOUNTER — Ambulatory Visit: Payer: Medicare HMO

## 2023-12-01 DIAGNOSIS — M6281 Muscle weakness (generalized): Secondary | ICD-10-CM

## 2023-12-01 DIAGNOSIS — R293 Abnormal posture: Secondary | ICD-10-CM

## 2023-12-01 DIAGNOSIS — M25512 Pain in left shoulder: Secondary | ICD-10-CM | POA: Diagnosis not present

## 2023-12-01 DIAGNOSIS — R600 Localized edema: Secondary | ICD-10-CM

## 2023-12-01 NOTE — Therapy (Signed)
OUTPATIENT PHYSICAL THERAPY SHOULDER TREATMENT      Patient Name: Joshua [name] [facility] III MRN: [id number] DOB:1946/03/05, 78 y.o., male Today's Date: 12/01/2023  END OF SESSION:  PT End of Session - 12/01/23 0759     Visit Number 15    Number of Visits 16    Date for PT Re-Evaluation 12/12/23    Authorization Type [employer] medicare    Authorization Time Period 12/9-12/12/23    Authorization - Visit Number 15    Authorization - Number of Visits 16    Progress Note Due on Visit 20    PT Start Time 0759    PT Stop Time 0853    PT Time Calculation (min) 54 min    Activity Tolerance Patient tolerated treatment well    Behavior During Therapy [facility] for tasks assessed/performed                 Past Medical History:  Diagnosis Date   Abnormal findings on diagnostic imaging of lung 09/23/2019   09/22/2019-lung cancer screening-centrilobular and paraseptal emphysema, calcified granulomata noted bilaterally, no suspicious nodules, lung RADS 1, repeat in 12 months    Anxiety    Atrial fibrillation (HCC) 09/23/2019   Centrilobular emphysema (HCC) 09/23/2019   09/22/2019-lung cancer screening-centrilobular and paraseptal emphysema, calcified granulomata noted bilaterally, no suspicious nodules, lung RADS 1, repeat in 12 months  09/23/2019-FVC 3.74 (81% predicted), postbronchodilator ratio 80, postbronchodilator FEV1 2.88 (86% predicted), no bronchodilator response, DLCO 19.44 (73% predicted)   DDD (degenerative disc disease), cervical 05/13/2008   Cervical Disc Degeneration   Depression    ETOH abuse 10/01/2023   continues to drink 1-3 drinks/ day   History of adenomatous polyp of colon 03/03/2018   05/14/17: Colonoscopy: nonadvanced adenoma, f/u 5 yrs, use SuPrep, [facility]/GAP  Last Assessment & Plan:  Relevant Hx: Course: Daily Update: Today's Plan:   History of kidney stones    Hyperlipidemia    Kidney stone    Nephrolithiasis 05/22/2011   Nontraumatic incomplete tear of right  rotator cuff 10/14/2018   Other and unspecified hyperlipidemia 11/20/2009   Hyperlipidemia   RBBB 06/09/2012   Secondary hypercoagulable state (HCC) 09/28/2019   Shortness of breath 09/23/2019   Past Surgical History:  Procedure Laterality Date   CARDIOVERSION N/A 10/15/2019   Procedure: CARDIOVERSION;  Surgeon: Jake Bathe, MD;  Location: [facility] ENDOSCOPY;  Service: Cardiovascular;  Laterality: N/A;   CARDIOVERSION N/A 11/29/2019   Procedure: CARDIOVERSION;  Surgeon: Lars Masson, MD;  Location: [facility] ENDOSCOPY;  Service: Cardiovascular;  Laterality: N/A;   HEMORRHOID SURGERY     REVERSE SHOULDER ARTHROPLASTY Left 10/09/2023   Procedure: REVERSE SHOULDER ARTHROPLASTY;  Surgeon: Bjorn Pippin, MD;  Location: [facility];  Service: Orthopedics;  Laterality: Left;   TOTAL HIP ARTHROPLASTY Right 12/21/2021   Procedure: TOTAL HIP ARTHROPLASTY ANTERIOR APPROACH;  Surgeon: Jodi Geralds, MD;  Location: [facility] ORS;  Service: Orthopedics;  Laterality: Right;   VARICOSE VEIN SURGERY Right    Patient Active Problem List   Diagnosis Date Noted   Alcohol-induced mood disorder with depressive symptoms (HCC) 06/10/2023   Benign prostatic hyperplasia without lower urinary tract symptoms 06/05/2023   Dyslipidemia 06/05/2023   Fall 06/05/2023   History of peripheral neuropathy 06/05/2023   Subdural hematoma (HCC) 06/04/2023   Pain in finger of left hand 03/12/2023   Alcohol abuse 05/31/2022   Disorder of thoracic spine 05/31/2022   COVID-19 virus infection 03/04/2022   Acute bronchitis 03/04/2022   OSA (obstructive sleep apnea)  03/04/2022   PAF (paroxysmal atrial fibrillation) (HCC) 12/13/2021   Anxiety 11/08/2021   Aphasia 11/08/2021   Bipolar 1 disorder (HCC) 11/08/2021   CKD (chronic kidney disease), stage III (HCC) 11/08/2021   Expressive aphasia 11/08/2021   Prolonged QT interval 11/08/2021   Recurrent falls 11/08/2021   Primary osteoarthritis of right hip 10/26/2021    Abnormal liver function tests 09/10/2021   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   Urinary hesitancy 03/08/2021   Thoracic aortic aneurysm without rupture (HCC) 03/08/2021   Senile purpura (HCC) 09/07/2020   Aortic atherosclerosis (HCC) 09/07/2020   Sinus bradycardia 03/31/2020   Essential hypertension 03/31/2020   Major depressive disorder 03/07/2020   Essential tremor 03/07/2020   Tremor 01/21/2020   Obesity (BMI 30-39.9) 12/10/2019   Chest pain of uncertain etiology 12/10/2019   Secondary hypercoagulable state (HCC) 09/28/2019   Centrilobular emphysema (HCC) 09/23/2019   Former smoker 09/23/2019   Abnormal findings on diagnostic imaging of lung 09/23/2019   Atrial fibrillation (HCC) 09/23/2019   At risk for sleep apnea 09/23/2019   Shortness of breath 09/23/2019   Nontraumatic incomplete tear of right rotator cuff 10/14/2018   History of adenomatous polyp of colon 03/03/2018   RBBB 06/09/2012   Nephrolithiasis 05/22/2011   Mixed hyperlipidemia 11/20/2009   DDD (degenerative disc disease), cervical 05/13/2008    PCP: Everrett Coombe, DO REFERRING PROVIDER: Ramond Marrow, MD REFERRING DIAG: [phone] (ICD-10-CM) - Status post reverse total arthroplasty of left shoulder  THERAPY DIAG:  Acute pain of left shoulder  Muscle weakness (generalized)  Localized edema  Abnormal posture  Rationale for Evaluation and Treatment: Rehabilitation  ONSET DATE: 10/09/23 DATE OF SURGERY  SUBJECTIVE:                                                                                                                                                                                     SUBJECTIVE STATEMENT: Patient reports the shoulder feels good at rest, still has some pain with reaching overhead or out to the side.   Hand dominance: Right  PERTINENT HISTORY: H/o a-fib, loop recorder, ETOH use, depression  PAIN:  Are you having pain? Yes: NPRS scale: none currently; 6 at worst/10 Pain location: Lt lateral  shoulder Pain description: sore Aggravating factors: overhead reaching Relieving factors: medicine  PRECAUTIONS: Shoulder and Other: reverse total shoulder replacement  to follow protocol for reverse total shoulder  WEIGHT BEARING RESTRICTIONS: Yes < 1 lb per protocol  FALLS:  Has patient fallen in last 6 months? Yes. Number of falls 2x in the past 6 months. He has a h/o drinking and felt like gabapentin + alcohol led to falls.    LIVING ENVIRONMENT: Lives with:  lives with their spouse Lives in: House/apartment Stairs:  2 to garage and 2 to deck in back  OCCUPATION: Retired from [employer]; enjoys Chiropractor things  PLOF: Independent  PATIENT GOALS:reduced pain and improve use of the arm "as close to normal as I can get"  NEXT MD VISIT:  01/04/24  OBJECTIVE:  Note: Objective measures were completed at Evaluation unless otherwise noted.  PATIENT SURVEYS:  FOTO 44 11/04/23: 44%  11/13/23: 58% function   EDEMA: Pocket of edema in distal medial arm, bruising in proximal biceps bruising and edema noted dressing in place  POSTURE: Rounded shoulders and forward head position  UPPER EXTREMITY ROM:   will assess next visit (< 1 week-- focused on elbow, wrist, positioning today) Passive ROM Right eval Left 12/13 11/06/23 Left  11/13/23 Left  11/25/23 Left  11/27/23 Left   Shoulder flexion  75 140 PROM  135 PROM 136 AROM   Shoulder extension        Shoulder abduction   110 PROM 112 PROM    Shoulder adduction        Shoulder internal rotation        Shoulder external rotation  0 32 PROM  42 PROM 45 PROM 56 PROM  Elbow flexion        Elbow extension        Wrist flexion        Wrist extension        Wrist ulnar deviation        Wrist radial deviation        Wrist pronation        Wrist supination        (Blank rows = not tested)  UPPER EXTREMITY MMT:  wrist flexion/extension grip strength 35 L and 60 R UNABLE-- will assess once protocol allows MMT Right eval Left eval   Shoulder flexion    Shoulder extension    Shoulder abduction    Shoulder adduction    Shoulder internal rotation    Shoulder external rotation    Middle trapezius    Lower trapezius    Elbow flexion    Elbow extension    Wrist flexion    Wrist extension    Wrist ulnar deviation    Wrist radial deviation    Wrist pronation    Wrist supination    Grip strength (lbs)    (Blank rows = not tested) [facility] Adult PT Treatment:                                                DATE: 12/01/23 Therapeutic Exercise: Pulleys flexion/scaption x 2 min each  Supine inclined shoulder flexion AROM 35 degree inclined  Supine inclined shoulder AAROM with physioball x 10  Standing rows to neutral green band 2 x 10  Finger ladder abduction x 5  Manual Therapy: Lt shoulder PROM flexion, abduction, and ER to tolerance with stretching at available end range Modalities: Game ready (vaso) Lt shoulder, 34 deg, med compression x 10 min    [facility] Adult PT Treatment:                                                DATE: 11/27/23 Therapeutic Exercise: Pulleys flexion/scaption x  2 min each  Sidelying ER x 10  Inclined shoulder flexion/scaption AROM; 30 degrees inclined; x 10  Updated HEP  Manual Therapy: Lt shoulder PROM flexion, abduction, and ER to tolerance with stretching at available end range STM Lt deltoid, bicep brachii d  Modalities: Game ready (vaso) Lt shoulder, 34 deg, med compression x 10 min    [facility] Adult PT Treatment:                                                DATE: 11/25/23 Therapeutic Exercise: Supine shoulder flexion AROM x 10  Sidelying shoulder abduction AROM (approx 90 degrees) x 10  Standing ER AAROM with dowel x 10; 5 sec hold  Reviewed and updated HEP  Manual Therapy: Lt shoulder PROM flexion, abduction, and ER to tolerance with stretching at available end range  Modalities: Game ready (vaso) Lt shoulder, 34 deg, med compression x 10 min      PATIENT  EDUCATION: Education details: HEP review  Person educated: Patient  Education method: Programmer, multimedia,  Education comprehension: verbalized understanding,  HOME EXERCISE PROGRAM: Access Code: [email or web address] URL: [email or web address] Date: 11/27/2023 Prepared by: Letitia Libra  Exercises - Seated Scapular Retraction  - 1 x daily - 7 x weekly - 3 sets - 10 reps - Supine Shoulder Flexion AAROM with Hands Clasped  - 1 x daily - 7 x weekly - 1 sets - 10 reps - 5 sec  hold - Seated Shoulder Abduction Towel Slide at Table Top  - 1 x daily - 7 x weekly - 1 sets - 10 reps - 5 sec  hold - Standing Shoulder Abduction AAROM with Dowel  - 1 x daily - 7 x weekly - 1 sets - 10 reps - 5 sec  hold - Standing Shoulder External Rotation AAROM with Dowel  - 1 x daily - 7 x weekly - 1 sets - 10 reps - 5 sec  hold - Seated Single Arm Bicep Curls with Rotation and Dumbbell  - 1 x daily - 7 x weekly - 2 sets - 10 reps - Supine Shoulder Flexion Extension Full Range AROM  - 1 x daily - 7 x weekly - 1 sets - 10 reps - Supine Single Arm Shoulder External Rotation AROM  - 1 x daily - 7 x weekly - 1 sets - 10 reps - Standing Isometric Shoulder External Rotation with Doorway  - 1 x daily - 7 x weekly - 2 sets - 10 reps - Sidelying Shoulder Abduction  - 1 x daily - 7 x weekly - 2 sets - 10 reps - Seated Shoulder Scaption AAROM with Pulley at Side  - 1 x daily - 7 x weekly - 2 sets - 10 reps  ASSESSMENT:  CLINICAL IMPRESSION: Patient continues to make steady progress in PT. Able to progress AROM in inclined position today with patient achieving 130 degrees of flexion in this position reporting pain at available end range. Introduced periscapular strengthening within precautions with patient demonstrating good form and control.   OBJECTIVE IMPAIRMENTS: decreased activity tolerance, decreased ROM, decreased strength, increased edema, increased fascial restrictions, increased muscle spasms, impaired flexibility,  impaired tone, postural dysfunction, and pain.     GOALS: Goals reviewed with patient? Yes  SHORT TERM GOALS: Target date: 11/13/23  The patient will be indep with HEP. Baseline: initiated at eval Goal status: MET  2.  The patient will have PROM documented. Baseline:  began <1 week with elbow and wrist ROM + grip and neck ROM Goal status: MET   LONG TERM GOALS: Target date: 12/13/23  The patient will be indep with HEP. Baseline:  initiated at eval Goal status: progressing   2.  The patient will improve ROM to protocol ranges of flexion to 110-120, ER 30-45. Baseline: not assessed at eval Goal status: MET  3.  The patient will tolerate isometrics below shoulder level. Baseline:  not assessed at eval Goal status: progressing   4.  The patient will reach into shoulder flexion/scaption to 90 degrees to demo improved strength against gravity. Baseline: not assessed at eval. 11/13/23: deferred due to post-op restrictions  Goal status: ongoing    5.  The patient will reduce pain to < or equal to 2/10. Baseline:  5-9/10 Goal status: MET  6.  Pt will improve FOTO to >= 74 Baseline:  Goal status: progressing     PLAN:  PT FREQUENCY: 1-2x/week  PT DURATION: 8 weeks  PLANNED INTERVENTIONS: 97164- PT Re-evaluation, 97110-Therapeutic exercises, 97530- Therapeutic activity, 97112- Neuromuscular re-education, 97535- Self Care, 21308- Manual therapy, 97014- Electrical stimulation (unattended), 97016- Vasopneumatic device, Patient/Family education, Taping, Dry Needling, Joint mobilization, Cryotherapy, and Moist heat  PLAN FOR NEXT SESSION: progress per protocol; re-cert    Letitia Libra, PT, DPT, ATC 12/01/23 8:45 AM

## 2023-12-02 DIAGNOSIS — C4441 Basal cell carcinoma of skin of scalp and neck: Secondary | ICD-10-CM | POA: Diagnosis not present

## 2023-12-02 DIAGNOSIS — D485 Neoplasm of uncertain behavior of skin: Secondary | ICD-10-CM | POA: Diagnosis not present

## 2023-12-02 DIAGNOSIS — L570 Actinic keratosis: Secondary | ICD-10-CM | POA: Diagnosis not present

## 2023-12-02 DIAGNOSIS — Z85828 Personal history of other malignant neoplasm of skin: Secondary | ICD-10-CM | POA: Diagnosis not present

## 2023-12-04 ENCOUNTER — Ambulatory Visit: Payer: Medicare HMO

## 2023-12-04 DIAGNOSIS — M25512 Pain in left shoulder: Secondary | ICD-10-CM | POA: Diagnosis not present

## 2023-12-04 DIAGNOSIS — R293 Abnormal posture: Secondary | ICD-10-CM | POA: Diagnosis not present

## 2023-12-04 DIAGNOSIS — M6281 Muscle weakness (generalized): Secondary | ICD-10-CM | POA: Diagnosis not present

## 2023-12-04 DIAGNOSIS — R600 Localized edema: Secondary | ICD-10-CM | POA: Diagnosis not present

## 2023-12-04 NOTE — Therapy (Signed)
OUTPATIENT PHYSICAL THERAPY SHOULDER TREATMENT RE-CERTIFICATION       Patient Name: Joshua [name] MRN: [id number] DOB:Jul 23, 1946, 78 y.o., male Today's Date: 12/04/2023  END OF SESSION:  PT End of Session - 12/04/23 0848     Visit Number 16    Number of Visits 32    Date for PT Re-Evaluation 01/31/24    Authorization Type [employer] medicare    Authorization Time Period 12/9-12/12/23    Authorization - Visit Number 16    Authorization - Number of Visits 16    Progress Note Due on Visit 20    PT Start Time 0848    PT Stop Time 0938    PT Time Calculation (min) 50 min    Activity Tolerance Patient tolerated treatment well    Behavior During Therapy [facility] for tasks assessed/performed                  Past Medical History:  Diagnosis Date   Abnormal findings on diagnostic imaging of lung 09/23/2019   09/22/2019-lung cancer screening-centrilobular and paraseptal emphysema, calcified granulomata noted bilaterally, no suspicious nodules, lung RADS 1, repeat in 12 months    Anxiety    Atrial fibrillation (HCC) 09/23/2019   Centrilobular emphysema (HCC) 09/23/2019   09/22/2019-lung cancer screening-centrilobular and paraseptal emphysema, calcified granulomata noted bilaterally, no suspicious nodules, lung RADS 1, repeat in 12 months  09/23/2019-FVC 3.74 (81% predicted), postbronchodilator ratio 80, postbronchodilator FEV1 2.88 (86% predicted), no bronchodilator response, DLCO 19.44 (73% predicted)   DDD (degenerative disc disease), cervical 05/13/2008   Cervical Disc Degeneration   Depression    ETOH abuse 10/01/2023   continues to drink 1-3 drinks/ day   History of adenomatous polyp of colon 03/03/2018   05/14/17: Colonoscopy: nonadvanced adenoma, f/u 5 yrs, use SuPrep, Murphy/GAP  Last Assessment & Plan:  Relevant Hx: Course: Daily Update: Today's Plan:   History of kidney stones    Hyperlipidemia    Kidney stone    Nephrolithiasis 05/22/2011   Nontraumatic incomplete  tear of right rotator cuff 10/14/2018   Other and unspecified hyperlipidemia 11/20/2009   Hyperlipidemia   RBBB 06/09/2012   Secondary hypercoagulable state (HCC) 09/28/2019   Shortness of breath 09/23/2019   Past Surgical History:  Procedure Laterality Date   CARDIOVERSION N/A 10/15/2019   Procedure: CARDIOVERSION;  Surgeon: Jake Bathe, MD;  Location: [facility] ENDOSCOPY;  Service: Cardiovascular;  Laterality: N/A;   CARDIOVERSION N/A 11/29/2019   Procedure: CARDIOVERSION;  Surgeon: Lars Masson, MD;  Location: [facility] ENDOSCOPY;  Service: Cardiovascular;  Laterality: N/A;   HEMORRHOID SURGERY     REVERSE SHOULDER ARTHROPLASTY Left 10/09/2023   Procedure: REVERSE SHOULDER ARTHROPLASTY;  Surgeon: Bjorn Pippin, MD;  Location: [facility];  Service: Orthopedics;  Laterality: Left;   TOTAL HIP ARTHROPLASTY Right 12/21/2021   Procedure: TOTAL HIP ARTHROPLASTY ANTERIOR APPROACH;  Surgeon: Jodi Geralds, MD;  Location: [facility] ORS;  Service: Orthopedics;  Laterality: Right;   VARICOSE VEIN SURGERY Right    Patient Active Problem List   Diagnosis Date Noted   Alcohol-induced mood disorder with depressive symptoms (HCC) 06/10/2023   Benign prostatic hyperplasia without lower urinary tract symptoms 06/05/2023   Dyslipidemia 06/05/2023   Fall 06/05/2023   History of peripheral neuropathy 06/05/2023   Subdural hematoma (HCC) 06/04/2023   Pain in finger of left hand 03/12/2023   Alcohol abuse 05/31/2022   Disorder of thoracic spine 05/31/2022   COVID-19 virus infection 03/04/2022   Acute bronchitis 03/04/2022   OSA (  obstructive sleep apnea) 03/04/2022   PAF (paroxysmal atrial fibrillation) (HCC) 12/13/2021   Anxiety 11/08/2021   Aphasia 11/08/2021   Bipolar 1 disorder (HCC) 11/08/2021   CKD (chronic kidney disease), stage [name] (HCC) 11/08/2021   Expressive aphasia 11/08/2021   Prolonged QT interval 11/08/2021   Recurrent falls 11/08/2021   Primary osteoarthritis of right hip  10/26/2021   Abnormal liver function tests 09/10/2021   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   Urinary hesitancy 03/08/2021   Thoracic aortic aneurysm without rupture (HCC) 03/08/2021   Senile purpura (HCC) 09/07/2020   Aortic atherosclerosis (HCC) 09/07/2020   Sinus bradycardia 03/31/2020   Essential hypertension 03/31/2020   Major depressive disorder 03/07/2020   Essential tremor 03/07/2020   Tremor 01/21/2020   Obesity (BMI 30-39.9) 12/10/2019   Chest pain of uncertain etiology 12/10/2019   Secondary hypercoagulable state (HCC) 09/28/2019   Centrilobular emphysema (HCC) 09/23/2019   Former smoker 09/23/2019   Abnormal findings on diagnostic imaging of lung 09/23/2019   Atrial fibrillation (HCC) 09/23/2019   At risk for sleep apnea 09/23/2019   Shortness of breath 09/23/2019   Nontraumatic incomplete tear of right rotator cuff 10/14/2018   History of adenomatous polyp of colon 03/03/2018   RBBB 06/09/2012   Nephrolithiasis 05/22/2011   Mixed hyperlipidemia 11/20/2009   DDD (degenerative disc disease), cervical 05/13/2008    PCP: Everrett Coombe, DO REFERRING PROVIDER: Ramond Marrow, MD REFERRING DIAG: [phone] (ICD-10-CM) - Status post reverse total arthroplasty of left shoulder  THERAPY DIAG:  Acute pain of left shoulder  Muscle weakness (generalized)  Localized edema  Abnormal posture  Rationale for Evaluation and Treatment: Rehabilitation  ONSET DATE: 10/09/23 DATE OF SURGERY  SUBJECTIVE:                                                                                                                                                                                     SUBJECTIVE STATEMENT: "Feeling better."  Hand dominance: Right  PERTINENT HISTORY: H/o a-fib, loop recorder, ETOH use, depression  PAIN:  Are you having pain? Yes: NPRS scale: none currently; 6 at worst/10 Pain location: Lt lateral shoulder Pain description: dull Aggravating factors: overhead reaching Relieving  factors: medicine  PRECAUTIONS: Shoulder and Other: reverse total shoulder replacement  to follow protocol for reverse total shoulder  WEIGHT BEARING RESTRICTIONS: Yes < 1 lb per protocol  FALLS:  Has patient fallen in last 6 months? Yes. Number of falls 2x in the past 6 months. He has a h/o drinking and felt like gabapentin + alcohol led to falls.    LIVING ENVIRONMENT: Lives with: lives with their spouse Lives in: House/apartment Stairs:  2 to garage and 2 to deck  in back  OCCUPATION: Retired from [employer]; enjoys Chiropractor things  PLOF: Independent  PATIENT GOALS:reduced pain and improve use of the arm "as close to normal as I can get"  NEXT MD VISIT:  01/04/24  OBJECTIVE:  Note: Objective measures were completed at Evaluation unless otherwise noted.  PATIENT SURVEYS:  FOTO 44 11/04/23: 44%  11/13/23: 58% function  12/04/23: 59% function   EDEMA: Pocket of edema in distal medial arm, bruising in proximal biceps bruising and edema noted dressing in place  POSTURE: Rounded shoulders and forward head position  UPPER EXTREMITY ROM:   will assess next visit (< 1 week-- focused on elbow, wrist, positioning today) Passive ROM Right eval Left 12/13 11/06/23 Left  11/13/23 Left  11/25/23 Left  11/27/23 Left  12/04/23 Left   Shoulder flexion  75 140 PROM  135 PROM 136 AROM  138 AROM  Shoulder extension         Shoulder abduction   110 PROM 112 PROM   95 AROM  Shoulder adduction         Shoulder internal rotation         Shoulder external rotation  0 32 PROM  42 PROM 45 PROM 56 PROM 30 AROM  Elbow flexion         Elbow extension         Wrist flexion         Wrist extension         Wrist ulnar deviation         Wrist radial deviation         Wrist pronation         Wrist supination         (Blank rows = not tested)  UPPER EXTREMITY MMT:  wrist flexion/extension grip strength 35 L and 60 R UNABLE-- will assess once protocol allows MMT Right eval Left 12/04/23  Shoulder  flexion  3-  Shoulder extension    Shoulder abduction  3-  Shoulder adduction    Shoulder internal rotation  Deferred   Shoulder external rotation  Deferred   Middle trapezius    Lower trapezius    Elbow flexion    Elbow extension    Wrist flexion    Wrist extension    Wrist ulnar deviation    Wrist radial deviation    Wrist pronation    Wrist supination    Grip strength (lbs)    (Blank rows = not tested) [facility] Adult PT Treatment:                                                DATE: 12/04/23 Therapeutic Exercise: Pulleys flexion/scaption x 2 min each  Sidelying ER 2 x 10 with towel roll for scapular plane Inclined shoulder flexion AAROM with physioball 35 degree incline x 10  Reviewed HEP  Manual Therapy: Lt shoulder PROM flexion, abduction, and ER to tolerance   Therapeutic Activity: Re-assessment to determine overall progress, educating patient on progress towards goals.  Modalities: Game ready (vaso) Lt shoulder, 34 deg, med compression x 10 min    [facility] Adult PT Treatment:  DATE: 12/01/23 Therapeutic Exercise: Pulleys flexion/scaption x 2 min each  Supine inclined shoulder flexion AROM 35 degree inclined  Supine inclined shoulder AAROM with physioball x 10  Standing rows to neutral green band 2 x 10  Finger ladder abduction x 5  Manual Therapy: Lt shoulder PROM flexion, abduction, and ER to tolerance with stretching at available end range Modalities: Game ready (vaso) Lt shoulder, 34 deg, med compression x 10 min    [facility] Adult PT Treatment:                                                DATE: 11/27/23 Therapeutic Exercise: Pulleys flexion/scaption x 2 min each  Sidelying ER x 10  Inclined shoulder flexion/scaption AROM; 30 degrees inclined; x 10  Updated HEP  Manual Therapy: Lt shoulder PROM flexion, abduction, and ER to tolerance with stretching at available end range STM Lt deltoid, bicep brachii  d  Modalities: Game ready (vaso) Lt shoulder, 34 deg, med compression x 10 min      PATIENT EDUCATION: Education details: HEP review  Person educated: Patient  Education method: Programmer, multimedia,  Education comprehension: verbalized understanding,  HOME EXERCISE PROGRAM: Access Code: [email or web address] URL: [email or web address] Date: 11/27/2023 Prepared by: Letitia Libra  Exercises - Seated Scapular Retraction  - 1 x daily - 7 x weekly - 3 sets - 10 reps - Supine Shoulder Flexion AAROM with Hands Clasped  - 1 x daily - 7 x weekly - 1 sets - 10 reps - 5 sec  hold - Seated Shoulder Abduction Towel Slide at Table Top  - 1 x daily - 7 x weekly - 1 sets - 10 reps - 5 sec  hold - Standing Shoulder Abduction AAROM with Dowel  - 1 x daily - 7 x weekly - 1 sets - 10 reps - 5 sec  hold - Standing Shoulder External Rotation AAROM with Dowel  - 1 x daily - 7 x weekly - 1 sets - 10 reps - 5 sec  hold - Seated Single Arm Bicep Curls with Rotation and Dumbbell  - 1 x daily - 7 x weekly - 2 sets - 10 reps - Supine Shoulder Flexion Extension Full Range AROM  - 1 x daily - 7 x weekly - 1 sets - 10 reps - Supine Single Arm Shoulder External Rotation AROM  - 1 x daily - 7 x weekly - 1 sets - 10 reps - Standing Isometric Shoulder External Rotation with Doorway  - 1 x daily - 7 x weekly - 2 sets - 10 reps - Sidelying Shoulder Abduction  - 1 x daily - 7 x weekly - 2 sets - 10 reps - Seated Shoulder Scaption AAROM with Pulley at Side  - 1 x daily - 7 x weekly - 2 sets - 10 reps  ASSESSMENT:  CLINICAL IMPRESSION: Joshua [name] is progressing as expected at 8 weeks s/p Lt RSA. He has tolerated progression into AAROM/AROM in gravity eliminated positioning well. He continues to exhibit ROM and strength deficits that are consistent with his post-operative status and will benefit from continuing with skilled PT 1-2 x week for 8 additional weeks to address these lingering deficits in order to optimize his function and  assist in overall pain reduction.   OBJECTIVE IMPAIRMENTS: decreased activity tolerance, decreased ROM, decreased strength, increased edema, increased fascial restrictions, increased muscle spasms, impaired  flexibility, impaired tone, postural dysfunction, and pain.     GOALS: Goals reviewed with patient? Yes  SHORT TERM GOALS: Target date: 11/13/23  The patient will be indep with HEP. Baseline: initiated at eval Goal status: MET  2.  The patient will have PROM documented. Baseline:  began <1 week with elbow and wrist ROM + grip and neck ROM Goal status: MET   LONG TERM GOALS: Target date: 01/31/24  The patient will be indep with HEP. Baseline:  initiated at eval Goal status: progressing   2.  The patient will improve ROM to protocol ranges of flexion to 110-120, ER 30-45. Baseline: not assessed at eval 12/04/23: met PROM, progressing AROM per protocol  Goal status: MET   3.  The patient will tolerate isometrics below shoulder level. Baseline:  not assessed at eval Goal status: MET   4.  The patient will reach into shoulder flexion/scaption to 90 degrees to demo improved strength against gravity. Baseline: not assessed at eval. 11/13/23: deferred due to post-op restrictions  12/04/23: flexion to 138 in supine  Goal status: ongoing    5.  The patient will reduce pain to < or equal to 2/10. Baseline:  5-9/10 12/04/23: 6 at worst  Goal status: progressing   6.  Pt will improve FOTO to >= 74 Baseline: see above  Goal status: progressing   7. Patient will demonstrate at least 4/5 gross Lt shoulder strength to improve ability to lift and carry items.   Baseline: see MMT chart   Goal status: NEW  PLAN:  PT FREQUENCY: 1-2x/week  PT DURATION: 8 weeks  PLANNED INTERVENTIONS: 97164- PT Re-evaluation, 97110-Therapeutic exercises, 97530- Therapeutic activity, 97112- Neuromuscular re-education, 97535- Self Care, 96045- Manual therapy, 97014- Electrical stimulation (unattended),  97016- Vasopneumatic device, Patient/Family education, Taping, Dry Needling, Joint mobilization, Cryotherapy, and Moist heat  PLAN FOR NEXT SESSION: progress per protocol   Letitia Libra, PT, DPT, ATC 12/04/23 12:45 PM  Referring diagnosis? W09.811 (ICD-10-CM) - Status post reverse total arthroplasty of left shoulder Treatment diagnosis? (if different than referring diagnosis)    Acute pain of left shoulder   Muscle weakness (generalized)   Abnormal posture   What was this (referring dx) caused by? [x]  Surgery []  Fall []  Ongoing issue []  Arthritis []  Other: ____________   Laterality: []  Rt [x]  Lt []  Both   Check all possible CPT codes:                   *CHOOSE 10 OR LESS*                                See Planned Interventions listed in the Plan section of the Evaluation.

## 2023-12-08 ENCOUNTER — Ambulatory Visit: Payer: Medicare HMO | Attending: Orthopaedic Surgery

## 2023-12-08 DIAGNOSIS — M25512 Pain in left shoulder: Secondary | ICD-10-CM | POA: Insufficient documentation

## 2023-12-08 DIAGNOSIS — R262 Difficulty in walking, not elsewhere classified: Secondary | ICD-10-CM | POA: Diagnosis not present

## 2023-12-08 DIAGNOSIS — R293 Abnormal posture: Secondary | ICD-10-CM | POA: Insufficient documentation

## 2023-12-08 DIAGNOSIS — M6281 Muscle weakness (generalized): Secondary | ICD-10-CM | POA: Diagnosis not present

## 2023-12-08 DIAGNOSIS — R600 Localized edema: Secondary | ICD-10-CM | POA: Diagnosis not present

## 2023-12-08 NOTE — Therapy (Signed)
OUTPATIENT PHYSICAL THERAPY SHOULDER TREATMENT       Patient Name: Joshua [name] MRN: [id number] DOB:1946-02-29, 78 y.o., male Today's Date: 12/08/2023  END OF SESSION:  PT End of Session - 12/08/23 0846     Visit Number 17    Number of Visits 32    Date for PT Re-Evaluation 01/31/24    Authorization Type [employer] medicare    Authorization Time Period 2/3-3/26/25    Authorization - Visit Number 1    Authorization - Number of Visits 16    Progress Note Due on Visit 20    PT Start Time 0846    PT Stop Time 0937    PT Time Calculation (min) 51 min    Activity Tolerance Patient tolerated treatment well    Behavior During Therapy [facility] for tasks assessed/performed                   Past Medical History:  Diagnosis Date   Abnormal findings on diagnostic imaging of lung 09/23/2019   09/22/2019-lung cancer screening-centrilobular and paraseptal emphysema, calcified granulomata noted bilaterally, no suspicious nodules, lung RADS 1, repeat in 12 months    Anxiety    Atrial fibrillation (HCC) 09/23/2019   Centrilobular emphysema (HCC) 09/23/2019   09/22/2019-lung cancer screening-centrilobular and paraseptal emphysema, calcified granulomata noted bilaterally, no suspicious nodules, lung RADS 1, repeat in 12 months  09/23/2019-FVC 3.74 (81% predicted), postbronchodilator ratio 80, postbronchodilator FEV1 2.88 (86% predicted), no bronchodilator response, DLCO 19.44 (73% predicted)   DDD (degenerative disc disease), cervical 05/13/2008   Cervical Disc Degeneration   Depression    ETOH abuse 10/01/2023   continues to drink 1-3 drinks/ day   History of adenomatous polyp of colon 03/03/2018   05/14/17: Colonoscopy: nonadvanced adenoma, f/u 5 yrs, use SuPrep, Murphy/GAP  Last Assessment & Plan:  Relevant Hx: Course: Daily Update: Today's Plan:   History of kidney stones    Hyperlipidemia    Kidney stone    Nephrolithiasis 05/22/2011   Nontraumatic incomplete tear of right  rotator cuff 10/14/2018   Other and unspecified hyperlipidemia 11/20/2009   Hyperlipidemia   RBBB 06/09/2012   Secondary hypercoagulable state (HCC) 09/28/2019   Shortness of breath 09/23/2019   Past Surgical History:  Procedure Laterality Date   CARDIOVERSION N/A 10/15/2019   Procedure: CARDIOVERSION;  Surgeon: Jake Bathe, MD;  Location: [facility] ENDOSCOPY;  Service: Cardiovascular;  Laterality: N/A;   CARDIOVERSION N/A 11/29/2019   Procedure: CARDIOVERSION;  Surgeon: Lars Masson, MD;  Location: [facility] ENDOSCOPY;  Service: Cardiovascular;  Laterality: N/A;   HEMORRHOID SURGERY     REVERSE SHOULDER ARTHROPLASTY Left 10/09/2023   Procedure: REVERSE SHOULDER ARTHROPLASTY;  Surgeon: Bjorn Pippin, MD;  Location: [facility];  Service: Orthopedics;  Laterality: Left;   TOTAL HIP ARTHROPLASTY Right 12/21/2021   Procedure: TOTAL HIP ARTHROPLASTY ANTERIOR APPROACH;  Surgeon: Jodi Geralds, MD;  Location: [facility] ORS;  Service: Orthopedics;  Laterality: Right;   VARICOSE VEIN SURGERY Right    Patient Active Problem List   Diagnosis Date Noted   Alcohol-induced mood disorder with depressive symptoms (HCC) 06/10/2023   Benign prostatic hyperplasia without lower urinary tract symptoms 06/05/2023   Dyslipidemia 06/05/2023   Fall 06/05/2023   History of peripheral neuropathy 06/05/2023   Subdural hematoma (HCC) 06/04/2023   Pain in finger of left hand 03/12/2023   Alcohol abuse 05/31/2022   Disorder of thoracic spine 05/31/2022   COVID-19 virus infection 03/04/2022   Acute bronchitis 03/04/2022   OSA (  obstructive sleep apnea) 03/04/2022   PAF (paroxysmal atrial fibrillation) (HCC) 12/13/2021   Anxiety 11/08/2021   Aphasia 11/08/2021   Bipolar 1 disorder (HCC) 11/08/2021   CKD (chronic kidney disease), stage [name] (HCC) 11/08/2021   Expressive aphasia 11/08/2021   Prolonged QT interval 11/08/2021   Recurrent falls 11/08/2021   Primary osteoarthritis of right hip 10/26/2021    Abnormal liver function tests 09/10/2021   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   Urinary hesitancy 03/08/2021   Thoracic aortic aneurysm without rupture (HCC) 03/08/2021   Senile purpura (HCC) 09/07/2020   Aortic atherosclerosis (HCC) 09/07/2020   Sinus bradycardia 03/31/2020   Essential hypertension 03/31/2020   Major depressive disorder 03/07/2020   Essential tremor 03/07/2020   Tremor 01/21/2020   Obesity (BMI 30-39.9) 12/10/2019   Chest pain of uncertain etiology 12/10/2019   Secondary hypercoagulable state (HCC) 09/28/2019   Centrilobular emphysema (HCC) 09/23/2019   Former smoker 09/23/2019   Abnormal findings on diagnostic imaging of lung 09/23/2019   Atrial fibrillation (HCC) 09/23/2019   At risk for sleep apnea 09/23/2019   Shortness of breath 09/23/2019   Nontraumatic incomplete tear of right rotator cuff 10/14/2018   History of adenomatous polyp of colon 03/03/2018   RBBB 06/09/2012   Nephrolithiasis 05/22/2011   Mixed hyperlipidemia 11/20/2009   DDD (degenerative disc disease), cervical 05/13/2008    PCP: Everrett Coombe, DO REFERRING PROVIDER: Ramond Marrow, MD REFERRING DIAG: [phone] (ICD-10-CM) - Status post reverse total arthroplasty of left shoulder  THERAPY DIAG:  Acute pain of left shoulder  Muscle weakness (generalized)  Localized edema  Abnormal posture  Rationale for Evaluation and Treatment: Rehabilitation  ONSET DATE: 10/09/23 DATE OF SURGERY  SUBJECTIVE:                                                                                                                                                                                     SUBJECTIVE STATEMENT: Patient reports the shoulder is doing well. No pain right now.   Hand dominance: Right  PERTINENT HISTORY: H/o a-fib, loop recorder, ETOH use, depression  PAIN:  Are you having pain? Yes: NPRS scale: none currently; 6 at worst/10 Pain location: Lt lateral shoulder Pain description: dull Aggravating factors:  overhead reaching Relieving factors: medicine  PRECAUTIONS: Shoulder and Other: reverse total shoulder replacement  to follow protocol for reverse total shoulder  WEIGHT BEARING RESTRICTIONS: Yes < 1 lb per protocol  FALLS:  Has patient fallen in last 6 months? Yes. Number of falls 2x in the past 6 months. He has a h/o drinking and felt like gabapentin + alcohol led to falls.    LIVING ENVIRONMENT: Lives with: lives with their spouse Lives in:  House/apartment Stairs:  2 to garage and 2 to deck in back  OCCUPATION: Retired from [employer]; enjoys Chiropractor things  PLOF: Independent  PATIENT GOALS:reduced pain and improve use of the arm "as close to normal as I can get"  NEXT MD VISIT:  01/04/24  OBJECTIVE:  Note: Objective measures were completed at Evaluation unless otherwise noted.  PATIENT SURVEYS:  FOTO 44 11/04/23: 44%  11/13/23: 58% function  12/04/23: 59% function   EDEMA: Pocket of edema in distal medial arm, bruising in proximal biceps bruising and edema noted dressing in place  POSTURE: Rounded shoulders and forward head position  UPPER EXTREMITY ROM:   will assess next visit (< 1 week-- focused on elbow, wrist, positioning today) Passive ROM Right eval Left 12/13 11/06/23 Left  11/13/23 Left  11/25/23 Left  11/27/23 Left  12/04/23 Left   Shoulder flexion  75 140 PROM  135 PROM 136 AROM  138 AROM  Shoulder extension         Shoulder abduction   110 PROM 112 PROM   95 AROM  Shoulder adduction         Shoulder internal rotation         Shoulder external rotation  0 32 PROM  42 PROM 45 PROM 56 PROM 30 AROM  Elbow flexion         Elbow extension         Wrist flexion         Wrist extension         Wrist ulnar deviation         Wrist radial deviation         Wrist pronation         Wrist supination         (Blank rows = not tested)  UPPER EXTREMITY MMT:  wrist flexion/extension grip strength 35 L and 60 R UNABLE-- will assess once protocol allows MMT Right eval  Left 12/04/23  Shoulder flexion  3-  Shoulder extension    Shoulder abduction  3-  Shoulder adduction    Shoulder internal rotation  Deferred   Shoulder external rotation  Deferred   Middle trapezius    Lower trapezius    Elbow flexion    Elbow extension    Wrist flexion    Wrist extension    Wrist ulnar deviation    Wrist radial deviation    Wrist pronation    Wrist supination    Grip strength (lbs)    (Blank rows = not tested) [facility] Adult PT Treatment:                                                DATE: 12/08/23 Therapeutic Exercise: Pulleys flexion/scaption x 2 min each  Standing resisted row to neutral 2 x 10 green band Standing resisted extension to neutral 2 x 10 red band  Supine shoulder flexion AAROM with dowel x 10; 5 sec hold  Inclined shoulder flexion AROM x 12; 40 degrees inclined  Sidelying shoulder abduction x 10  Supine ER in scapular plane x 10  Finger ladder abduction x 10; 5 sec hold  Manual Therapy: Lt shoulder PROM flexion, abduction, and ER with stretching at available end range   Modalities: Game ready (vaso) Lt shoulder, 34 deg, med compression x 10 min    [facility] Adult PT Treatment:  DATE: 12/04/23 Therapeutic Exercise: Pulleys flexion/scaption x 2 min each  Sidelying ER 2 x 10 with towel roll for scapular plane Inclined shoulder flexion AAROM with physioball 35 degree incline x 10  Reviewed HEP  Manual Therapy: Lt shoulder PROM flexion, abduction, and ER to tolerance   Therapeutic Activity: Re-assessment to determine overall progress, educating patient on progress towards goals.  Modalities: Game ready (vaso) Lt shoulder, 34 deg, med compression x 10 min    [facility] Adult PT Treatment:                                                DATE: 12/01/23 Therapeutic Exercise: Pulleys flexion/scaption x 2 min each  Supine inclined shoulder flexion AROM 35 degree inclined  Supine inclined shoulder AAROM with  physioball x 10  Standing rows to neutral green band 2 x 10  Finger ladder abduction x 5  Manual Therapy: Lt shoulder PROM flexion, abduction, and ER to tolerance with stretching at available end range Modalities: Game ready (vaso) Lt shoulder, 34 deg, med compression x 10 min      PATIENT EDUCATION: Education details: HEP review  Person educated: Patient  Education method: Programmer, multimedia,  Education comprehension: verbalized understanding,  HOME EXERCISE PROGRAM: Access Code: [email or web address] URL: [email or web address] Date: 11/27/2023 Prepared by: Letitia Libra  Exercises - Seated Scapular Retraction  - 1 x daily - 7 x weekly - 3 sets - 10 reps - Supine Shoulder Flexion AAROM with Hands Clasped  - 1 x daily - 7 x weekly - 1 sets - 10 reps - 5 sec  hold - Seated Shoulder Abduction Towel Slide at Table Top  - 1 x daily - 7 x weekly - 1 sets - 10 reps - 5 sec  hold - Standing Shoulder Abduction AAROM with Dowel  - 1 x daily - 7 x weekly - 1 sets - 10 reps - 5 sec  hold - Standing Shoulder External Rotation AAROM with Dowel  - 1 x daily - 7 x weekly - 1 sets - 10 reps - 5 sec  hold - Seated Single Arm Bicep Curls with Rotation and Dumbbell  - 1 x daily - 7 x weekly - 2 sets - 10 reps - Supine Shoulder Flexion Extension Full Range AROM  - 1 x daily - 7 x weekly - 1 sets - 10 reps - Supine Single Arm Shoulder External Rotation AROM  - 1 x daily - 7 x weekly - 1 sets - 10 reps - Standing Isometric Shoulder External Rotation with Doorway  - 1 x daily - 7 x weekly - 2 sets - 10 reps - Sidelying Shoulder Abduction  - 1 x daily - 7 x weekly - 2 sets - 10 reps - Seated Shoulder Scaption AAROM with Pulley at Side  - 1 x daily - 7 x weekly - 2 sets - 10 reps  ASSESSMENT:  CLINICAL IMPRESSION: Continued with shoulder ROM progression and periscapular strengthening per post-operative protocol with good tolerance. Reports tightness at available end range of flexion, abduction, and ER ROM,  but with continued reps he reported less tightness and is noted to have improved range. Progressed postural strengthening with patient demonstrating good form and control with light resistance.   OBJECTIVE IMPAIRMENTS: decreased activity tolerance, decreased ROM, decreased strength, increased edema, increased fascial restrictions, increased muscle spasms, impaired flexibility, impaired tone, postural  dysfunction, and pain.     GOALS: Goals reviewed with patient? Yes  SHORT TERM GOALS: Target date: 11/13/23  The patient will be indep with HEP. Baseline: initiated at eval Goal status: MET  2.  The patient will have PROM documented. Baseline:  began <1 week with elbow and wrist ROM + grip and neck ROM Goal status: MET   LONG TERM GOALS: Target date: 01/31/24  The patient will be indep with HEP. Baseline:  initiated at eval Goal status: progressing   2.  The patient will improve ROM to protocol ranges of flexion to 110-120, ER 30-45. Baseline: not assessed at eval 12/04/23: met PROM, progressing AROM per protocol  Goal status: MET   3.  The patient will tolerate isometrics below shoulder level. Baseline:  not assessed at eval Goal status: MET   4.  The patient will reach into shoulder flexion/scaption to 90 degrees to demo improved strength against gravity. Baseline: not assessed at eval. 11/13/23: deferred due to post-op restrictions  12/04/23: flexion to 138 in supine  Goal status: ongoing    5.  The patient will reduce pain to < or equal to 2/10. Baseline:  5-9/10 12/04/23: 6 at worst  Goal status: progressing   6.  Pt will improve FOTO to >= 74 Baseline: see above  Goal status: progressing   7. Patient will demonstrate at least 4/5 gross Lt shoulder strength to improve ability to lift and carry items.   Baseline: see MMT chart   Goal status: NEW  PLAN:  PT FREQUENCY: 1-2x/week  PT DURATION: 8 weeks  PLANNED INTERVENTIONS: 97164- PT Re-evaluation, 97110-Therapeutic  exercises, 97530- Therapeutic activity, 97112- Neuromuscular re-education, 97535- Self Care, 13086- Manual therapy, 97014- Electrical stimulation (unattended), 97016- Vasopneumatic device, Patient/Family education, Taping, Dry Needling, Joint mobilization, Cryotherapy, and Moist heat  PLAN FOR NEXT SESSION: progress per protocol; update HEP for upcoming trip    Letitia Libra, PT, DPT, ATC 12/08/23 9:28 AM

## 2023-12-10 DIAGNOSIS — N3943 Post-void dribbling: Secondary | ICD-10-CM | POA: Diagnosis not present

## 2023-12-10 DIAGNOSIS — R3912 Poor urinary stream: Secondary | ICD-10-CM | POA: Diagnosis not present

## 2023-12-10 DIAGNOSIS — N401 Enlarged prostate with lower urinary tract symptoms: Secondary | ICD-10-CM | POA: Diagnosis not present

## 2023-12-10 DIAGNOSIS — R3911 Hesitancy of micturition: Secondary | ICD-10-CM | POA: Diagnosis not present

## 2023-12-11 ENCOUNTER — Ambulatory Visit: Payer: Medicare HMO

## 2023-12-11 DIAGNOSIS — M25512 Pain in left shoulder: Secondary | ICD-10-CM

## 2023-12-11 DIAGNOSIS — M6281 Muscle weakness (generalized): Secondary | ICD-10-CM

## 2023-12-11 DIAGNOSIS — R293 Abnormal posture: Secondary | ICD-10-CM

## 2023-12-11 DIAGNOSIS — R262 Difficulty in walking, not elsewhere classified: Secondary | ICD-10-CM | POA: Diagnosis not present

## 2023-12-11 DIAGNOSIS — R600 Localized edema: Secondary | ICD-10-CM

## 2023-12-11 NOTE — Therapy (Signed)
 OUTPATIENT PHYSICAL THERAPY SHOULDER TREATMENT       Patient Name: Joshua [name] [facility] III MRN: [id number] DOB:1946/09/18, 78 y.o., male Today's Date: 12/11/2023  END OF SESSION:  PT End of Session - 12/11/23 0847     Visit Number 18    Number of Visits 32    Date for PT Re-Evaluation 01/31/24    Authorization Type [employer] medicare    Authorization Time Period 2/3-3/26/25    Authorization - Visit Number 2    Authorization - Number of Visits 16    Progress Note Due on Visit 20    PT Start Time 0847    PT Stop Time 0939    PT Time Calculation (min) 52 min    Activity Tolerance Patient tolerated treatment well    Behavior During Therapy [facility] for tasks assessed/performed                    Past Medical History:  Diagnosis Date   Abnormal findings on diagnostic imaging of lung 09/23/2019   09/22/2019-lung cancer screening-centrilobular and paraseptal emphysema, calcified granulomata noted bilaterally, no suspicious nodules, lung RADS 1, repeat in 12 months    Anxiety    Atrial fibrillation (HCC) 09/23/2019   Centrilobular emphysema (HCC) 09/23/2019   09/22/2019-lung cancer screening-centrilobular and paraseptal emphysema, calcified granulomata noted bilaterally, no suspicious nodules, lung RADS 1, repeat in 12 months  09/23/2019-FVC 3.74 (81% predicted), postbronchodilator ratio 80, postbronchodilator FEV1 2.88 (86% predicted), no bronchodilator response, DLCO 19.44 (73% predicted)   DDD (degenerative disc disease), cervical 05/13/2008   Cervical Disc Degeneration   Depression    ETOH abuse 10/01/2023   continues to drink 1-3 drinks/ day   History of adenomatous polyp of colon 03/03/2018   05/14/17: Colonoscopy: nonadvanced adenoma, f/u 5 yrs, use SuPrep, Murphy/GAP  Last Assessment & Plan:  Relevant Hx: Course: Daily Update: Today's Plan:   History of kidney stones    Hyperlipidemia    Kidney stone    Nephrolithiasis 05/22/2011   Nontraumatic incomplete tear of right  rotator cuff 10/14/2018   Other and unspecified hyperlipidemia 11/20/2009   Hyperlipidemia   RBBB 06/09/2012   Secondary hypercoagulable state (HCC) 09/28/2019   Shortness of breath 09/23/2019   Past Surgical History:  Procedure Laterality Date   CARDIOVERSION N/A 10/15/2019   Procedure: CARDIOVERSION;  Surgeon: Jeffrie Oneil BROCKS, MD;  Location: [facility] ENDOSCOPY;  Service: Cardiovascular;  Laterality: N/A;   CARDIOVERSION N/A 11/29/2019   Procedure: CARDIOVERSION;  Surgeon: Maranda Leim DEL, MD;  Location: [facility] ENDOSCOPY;  Service: Cardiovascular;  Laterality: N/A;   HEMORRHOID SURGERY     REVERSE SHOULDER ARTHROPLASTY Left 10/09/2023   Procedure: REVERSE SHOULDER ARTHROPLASTY;  Surgeon: Cristy Bonner DASEN, MD;  Location: [facility];  Service: Orthopedics;  Laterality: Left;   TOTAL HIP ARTHROPLASTY Right 12/21/2021   Procedure: TOTAL HIP ARTHROPLASTY ANTERIOR APPROACH;  Surgeon: Yvone Rush, MD;  Location: [facility] ORS;  Service: Orthopedics;  Laterality: Right;   VARICOSE VEIN SURGERY Right    Patient Active Problem List   Diagnosis Date Noted   Alcohol-induced mood disorder with depressive symptoms (HCC) 06/10/2023   Benign prostatic hyperplasia without lower urinary tract symptoms 06/05/2023   Dyslipidemia 06/05/2023   Fall 06/05/2023   History of peripheral neuropathy 06/05/2023   Subdural hematoma (HCC) 06/04/2023   Pain in finger of left hand 03/12/2023   Alcohol abuse 05/31/2022   Disorder of thoracic spine 05/31/2022   COVID-19 virus infection 03/04/2022   Acute bronchitis 03/04/2022  OSA (obstructive sleep apnea) 03/04/2022   PAF (paroxysmal atrial fibrillation) (HCC) 12/13/2021   Anxiety 11/08/2021   Aphasia 11/08/2021   Bipolar 1 disorder (HCC) 11/08/2021   CKD (chronic kidney disease), stage III (HCC) 11/08/2021   Expressive aphasia 11/08/2021   Prolonged QT interval 11/08/2021   Recurrent falls 11/08/2021   Primary osteoarthritis of right hip 10/26/2021    Abnormal liver function tests 09/10/2021   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   Urinary hesitancy 03/08/2021   Thoracic aortic aneurysm without rupture (HCC) 03/08/2021   Senile purpura (HCC) 09/07/2020   Aortic atherosclerosis (HCC) 09/07/2020   Sinus bradycardia 03/31/2020   Essential hypertension 03/31/2020   Major depressive disorder 03/07/2020   Essential tremor 03/07/2020   Tremor 01/21/2020   Obesity (BMI 30-39.9) 12/10/2019   Chest pain of uncertain etiology 12/10/2019   Secondary hypercoagulable state (HCC) 09/28/2019   Centrilobular emphysema (HCC) 09/23/2019   Former smoker 09/23/2019   Abnormal findings on diagnostic imaging of lung 09/23/2019   Atrial fibrillation (HCC) 09/23/2019   At risk for sleep apnea 09/23/2019   Shortness of breath 09/23/2019   Nontraumatic incomplete tear of right rotator cuff 10/14/2018   History of adenomatous polyp of colon 03/03/2018   RBBB 06/09/2012   Nephrolithiasis 05/22/2011   Mixed hyperlipidemia 11/20/2009   DDD (degenerative disc disease), cervical 05/13/2008    PCP: Velma Ku, DO REFERRING PROVIDER: Bonner Hair, MD REFERRING DIAG: [phone] (ICD-10-CM) - Status post reverse total arthroplasty of left shoulder  THERAPY DIAG:  Acute pain of left shoulder  Muscle weakness (generalized)  Localized edema  Abnormal posture  Rationale for Evaluation and Treatment: Rehabilitation  ONSET DATE: 10/09/23 DATE OF SURGERY  SUBJECTIVE:                                                                                                                                                                                     SUBJECTIVE STATEMENT: Patient reports the shoulder is feeling good. His motion continues to improve. He leaves for [place]  tomorrow and will be gone a couple weeks.   Hand dominance: Right  PERTINENT HISTORY: H/o a-fib, loop recorder, ETOH use, depression  PAIN:  Are you having pain? Yes: NPRS scale: none currently; 4 at  worst/10 Pain location: Lt lateral shoulder Pain description: dull Aggravating factors: overhead reaching Relieving factors: medicine  PRECAUTIONS: Shoulder and Other: reverse total shoulder replacement  to follow protocol for reverse total shoulder  WEIGHT BEARING RESTRICTIONS: Yes < 1 lb per protocol  FALLS:  Has patient fallen in last 6 months? Yes. Number of falls 2x in the past 6 months. He has a h/o drinking and felt like gabapentin  + alcohol led to  falls.    LIVING ENVIRONMENT: Lives with: lives with their spouse Lives in: House/apartment Stairs:  2 to garage and 2 to deck in back  OCCUPATION: Retired from airline pilot; enjoys chiropractor things  PLOF: Independent  PATIENT GOALS:reduced pain and improve use of the arm as close to normal as I can get  NEXT MD VISIT:  01/04/24  OBJECTIVE:  Note: Objective measures were completed at Evaluation unless otherwise noted.  PATIENT SURVEYS:  FOTO 44 11/04/23: 44%  11/13/23: 58% function  12/04/23: 59% function   EDEMA: Pocket of edema in distal medial arm, bruising in proximal biceps bruising and edema noted dressing in place  POSTURE: Rounded shoulders and forward head position  UPPER EXTREMITY ROM:   will assess next visit (< 1 week-- focused on elbow, wrist, positioning today) Passive ROM Right eval Left 12/13 11/06/23 Left  11/13/23 Left  11/25/23 Left  11/27/23 Left  12/04/23 Left  12/11/23 Left AROM  Shoulder flexion  75 140 PROM  135 PROM 136 AROM  138 AROM 140  Shoulder extension          Shoulder abduction   110 PROM 112 PROM   95 AROM 97  Shoulder adduction          Shoulder internal rotation          Shoulder external rotation  0 32 PROM  42 PROM 45 PROM 56 PROM 30 AROM 36   Elbow flexion          Elbow extension          Wrist flexion          Wrist extension          Wrist ulnar deviation          Wrist radial deviation          Wrist pronation          Wrist supination          (Blank rows = not  tested)  UPPER EXTREMITY MMT:  wrist flexion/extension grip strength 35 L and 60 R UNABLE-- will assess once protocol allows MMT Right eval Left 12/04/23  Shoulder flexion  3-  Shoulder extension    Shoulder abduction  3-  Shoulder adduction    Shoulder internal rotation  Deferred   Shoulder external rotation  Deferred   Middle trapezius    Lower trapezius    Elbow flexion    Elbow extension    Wrist flexion    Wrist extension    Wrist ulnar deviation    Wrist radial deviation    Wrist pronation    Wrist supination    Grip strength (lbs)    (Blank rows = not tested) [facility] Adult PT Treatment:                                                DATE: 12/11/23 Therapeutic Exercise: Wall slides flexion and abduction x 10 each  Standing shoulder ER AAROM with dowel x 10 Standing shoulder abduction AAROM with dowel x 10  Inclined shoulder flexion AROM; inclined to 45 degrees x 10  Supine shoulder ER AROM in scapular plane x 10 Sidelying shoulder abduction x 10  Resisted rows green band 2 x 10  Supine shoulder flexion 1lb 2 x 10  Supine shoulder scaption 1 lb 2 x 10  Reviewed and updated  HEP   Modalities: Game ready (vaso) Lt shoulder, 34 deg, med compression x 10 min Self Care: Reviewed lifting and ROM restrictions   [facility] Adult PT Treatment:                                                DATE: 12/08/23 Therapeutic Exercise: Pulleys flexion/scaption x 2 min each  Standing resisted row to neutral 2 x 10 green band Standing resisted extension to neutral 2 x 10 red band  Supine shoulder flexion AAROM with dowel x 10; 5 sec hold  Inclined shoulder flexion AROM x 12; 40 degrees inclined  Sidelying shoulder abduction x 10  Supine ER in scapular plane x 10  Finger ladder abduction x 10; 5 sec hold  Manual Therapy: Lt shoulder PROM flexion, abduction, and ER with stretching at available end range   Modalities: Game ready (vaso) Lt shoulder, 34 deg, med compression x 10 min    [facility]  Adult PT Treatment:                                                DATE: 12/04/23 Therapeutic Exercise: Pulleys flexion/scaption x 2 min each  Sidelying ER 2 x 10 with towel roll for scapular plane Inclined shoulder flexion AAROM with physioball 35 degree incline x 10  Reviewed HEP  Manual Therapy: Lt shoulder PROM flexion, abduction, and ER to tolerance   Therapeutic Activity: Re-assessment to determine overall progress, educating patient on progress towards goals.  Modalities: Game ready (vaso) Lt shoulder, 34 deg, med compression x 10 min     PATIENT EDUCATION: Education details: HEP update  Person educated: Patient  Education method: Explanation, demo, cues, handout  Education comprehension: verbalized understanding, returned demo, cues   HOME EXERCISE PROGRAM: Access Code: [email or web address] URL: [email or web address] Date: 12/11/2023 Prepared by: Lucie Meeter  Exercises - Shoulder Flexion Wall Slide with Towel  - 1 x daily - 7 x weekly - 1 sets - 10 reps - 5 sec  hold - Standing Shoulder Abduction Slides at Wall  - 1 x daily - 7 x weekly - 1 sets - 10 reps - 5 sec  hold - Standing Shoulder Abduction AAROM with Dowel  - 1 x daily - 7 x weekly - 1 sets - 10 reps - 5 sec  hold - Standing Shoulder External Rotation AAROM with Dowel  - 1 x daily - 7 x weekly - 1 sets - 10 reps - 5 sec  hold - Supine Shoulder Flexion Extension Full Range AROM  - 1 x daily - 7 x weekly - 2 sets - 10 reps - Supine Single Arm Shoulder External Rotation AROM  - 1 x daily - 7 x weekly - 2 sets - 10 reps - Sidelying Shoulder Abduction  - 1 x daily - 7 x weekly - 2 sets - 10 reps - Seated Shoulder Scaption AAROM with Pulley at Side  - 1 x daily - 7 x weekly - 2 sets - 10 reps - Standing Shoulder Row with Anchored Resistance  - 1 x daily - 7 x weekly - 3 sets - 10 reps - Seated Single Arm Bicep Curls with Rotation and Dumbbell  - 1 x daily - 7 x  weekly - 2 sets - 10 reps  ASSESSMENT:  CLINICAL  IMPRESSION: Today's session focused on reviewing and updating HEP that patient can continue while he is traveling to [place]  for the next two weeks. We progressed shoulder AROM into further gravity dependent positioning and added light resistance with good tolerance. Lt shoulder AROM continues to gradually improve.   OBJECTIVE IMPAIRMENTS: decreased activity tolerance, decreased ROM, decreased strength, increased edema, increased fascial restrictions, increased muscle spasms, impaired flexibility, impaired tone, postural dysfunction, and pain.     GOALS: Goals reviewed with patient? Yes  SHORT TERM GOALS: Target date: 11/13/23  The patient will be indep with HEP. Baseline: initiated at eval Goal status: MET  2.  The patient will have PROM documented. Baseline:  began <1 week with elbow and wrist ROM + grip and neck ROM Goal status: MET   LONG TERM GOALS: Target date: 01/31/24  The patient will be indep with HEP. Baseline:  initiated at eval Goal status: progressing   2.  The patient will improve ROM to protocol ranges of flexion to 110-120, ER 30-45. Baseline: not assessed at eval 12/04/23: met PROM, progressing AROM per protocol  Goal status: MET   3.  The patient will tolerate isometrics below shoulder level. Baseline:  not assessed at eval Goal status: MET   4.  The patient will reach into shoulder flexion/scaption to 90 degrees to demo improved strength against gravity. Baseline: not assessed at eval. 11/13/23: deferred due to post-op restrictions  12/04/23: flexion to 138 in supine  Goal status: ongoing    5.  The patient will reduce pain to < or equal to 2/10. Baseline:  5-9/10 12/04/23: 6 at worst  Goal status: progressing   6.  Pt will improve FOTO to >= 74 Baseline: see above  Goal status: progressing   7. Patient will demonstrate at least 4/5 gross Lt shoulder strength to improve ability to lift and carry items.   Baseline: see MMT chart   Goal status:  NEW  PLAN:  PT FREQUENCY: 1-2x/week  PT DURATION: 8 weeks  PLANNED INTERVENTIONS: 97164- PT Re-evaluation, 97110-Therapeutic exercises, 97530- Therapeutic activity, 97112- Neuromuscular re-education, 97535- Self Care, [id number]- Manual therapy, 97014- Electrical stimulation (unattended), 97016- Vasopneumatic device, Patient/Family education, Taping, Dry Needling, Joint mobilization, Cryotherapy, and Moist heat  PLAN FOR NEXT SESSION: progress per protocol;    Teira Arcilla, PT, DPT, ATC 12/11/23 9:30 AM

## 2023-12-26 DIAGNOSIS — Z95818 Presence of other cardiac implants and grafts: Secondary | ICD-10-CM | POA: Diagnosis not present

## 2023-12-26 DIAGNOSIS — R55 Syncope and collapse: Secondary | ICD-10-CM | POA: Diagnosis not present

## 2023-12-30 ENCOUNTER — Ambulatory Visit: Payer: Medicare HMO | Admitting: Physical Therapy

## 2023-12-30 DIAGNOSIS — R600 Localized edema: Secondary | ICD-10-CM

## 2023-12-30 DIAGNOSIS — M25512 Pain in left shoulder: Secondary | ICD-10-CM | POA: Diagnosis not present

## 2023-12-30 DIAGNOSIS — R293 Abnormal posture: Secondary | ICD-10-CM

## 2023-12-30 DIAGNOSIS — M6281 Muscle weakness (generalized): Secondary | ICD-10-CM | POA: Diagnosis not present

## 2023-12-30 DIAGNOSIS — R262 Difficulty in walking, not elsewhere classified: Secondary | ICD-10-CM

## 2023-12-30 NOTE — Therapy (Signed)
 OUTPATIENT PHYSICAL THERAPY SHOULDER TREATMENT   Patient Name: Joshua [name] [facility] MRN: [id number] DOB:01-28-1946, 78 y.o., male Today's Date: 12/30/2023  END OF SESSION:  PT End of Session - 12/30/23 0846     Visit Number 19    Number of Visits 32    Date for PT Re-Evaluation 01/31/24    Authorization Type [employer] medicare    Authorization Time Period 2/3-3/26/25    Authorization - Number of Visits 16    Progress Note Due on Visit 20    PT Start Time 0845    PT Stop Time 0925    PT Time Calculation (min) 40 min    Activity Tolerance Patient tolerated treatment well    Behavior During Therapy [facility] for tasks assessed/performed                     Past Medical History:  Diagnosis Date   Abnormal findings on diagnostic imaging of lung 09/23/2019   09/22/2019-lung cancer screening-centrilobular and paraseptal emphysema, calcified granulomata noted bilaterally, no suspicious nodules, lung RADS 1, repeat in 12 months    Anxiety    Atrial fibrillation (HCC) 09/23/2019   Centrilobular emphysema (HCC) 09/23/2019   09/22/2019-lung cancer screening-centrilobular and paraseptal emphysema, calcified granulomata noted bilaterally, no suspicious nodules, lung RADS 1, repeat in 12 months  09/23/2019-FVC 3.74 (81% predicted), postbronchodilator ratio 80, postbronchodilator FEV1 2.88 (86% predicted), no bronchodilator response, DLCO 19.44 (73% predicted)   DDD (degenerative disc disease), cervical 05/13/2008   Cervical Disc Degeneration   Depression    ETOH abuse 10/01/2023   continues to drink 1-3 drinks/ day   History of adenomatous polyp of colon 03/03/2018   05/14/17: Colonoscopy: nonadvanced adenoma, f/u 5 yrs, use SuPrep, Murphy/GAP  Last Assessment & Plan:  Relevant Hx: Course: Daily Update: Today's Plan:   History of kidney stones    Hyperlipidemia    Kidney stone    Nephrolithiasis 05/22/2011   Nontraumatic incomplete tear of right rotator cuff 10/14/2018   Other and  unspecified hyperlipidemia 11/20/2009   Hyperlipidemia   RBBB 06/09/2012   Secondary hypercoagulable state (HCC) 09/28/2019   Shortness of breath 09/23/2019   Past Surgical History:  Procedure Laterality Date   CARDIOVERSION N/A 10/15/2019   Procedure: CARDIOVERSION;  Surgeon: Jake Bathe, MD;  Location: [facility] ENDOSCOPY;  Service: Cardiovascular;  Laterality: N/A;   CARDIOVERSION N/A 11/29/2019   Procedure: CARDIOVERSION;  Surgeon: Lars Masson, MD;  Location: [facility] ENDOSCOPY;  Service: Cardiovascular;  Laterality: N/A;   HEMORRHOID SURGERY     REVERSE SHOULDER ARTHROPLASTY Left 10/09/2023   Procedure: REVERSE SHOULDER ARTHROPLASTY;  Surgeon: Bjorn Pippin, MD;  Location: [facility];  Service: Orthopedics;  Laterality: Left;   TOTAL HIP ARTHROPLASTY Right 12/21/2021   Procedure: TOTAL HIP ARTHROPLASTY ANTERIOR APPROACH;  Surgeon: Jodi Geralds, MD;  Location: [facility] ORS;  Service: Orthopedics;  Laterality: Right;   VARICOSE VEIN SURGERY Right    Patient Active Problem List   Diagnosis Date Noted   Alcohol-induced mood disorder with depressive symptoms (HCC) 06/10/2023   Benign prostatic hyperplasia without lower urinary tract symptoms 06/05/2023   Dyslipidemia 06/05/2023   Fall 06/05/2023   History of peripheral neuropathy 06/05/2023   Subdural hematoma (HCC) 06/04/2023   Pain in finger of left hand 03/12/2023   Alcohol abuse 05/31/2022   Disorder of thoracic spine 05/31/2022   COVID-19 virus infection 03/04/2022   Acute bronchitis 03/04/2022   OSA (obstructive sleep apnea) 03/04/2022   PAF (paroxysmal atrial fibrillation) (  HCC) 12/13/2021   Anxiety 11/08/2021   Aphasia 11/08/2021   Bipolar 1 disorder (HCC) 11/08/2021   CKD (chronic kidney disease), stage III (HCC) 11/08/2021   Expressive aphasia 11/08/2021   Prolonged QT interval 11/08/2021   Recurrent falls 11/08/2021   Primary osteoarthritis of right hip 10/26/2021   Abnormal liver function tests 09/10/2021    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   Urinary hesitancy 03/08/2021   Thoracic aortic aneurysm without rupture (HCC) 03/08/2021   Senile purpura (HCC) 09/07/2020   Aortic atherosclerosis (HCC) 09/07/2020   Sinus bradycardia 03/31/2020   Essential hypertension 03/31/2020   Major depressive disorder 03/07/2020   Essential tremor 03/07/2020   Tremor 01/21/2020   Obesity (BMI 30-39.9) 12/10/2019   Chest pain of uncertain etiology 12/10/2019   Secondary hypercoagulable state (HCC) 09/28/2019   Centrilobular emphysema (HCC) 09/23/2019   Former smoker 09/23/2019   Abnormal findings on diagnostic imaging of lung 09/23/2019   Atrial fibrillation (HCC) 09/23/2019   At risk for sleep apnea 09/23/2019   Shortness of breath 09/23/2019   Nontraumatic incomplete tear of right rotator cuff 10/14/2018   History of adenomatous polyp of colon 03/03/2018   RBBB 06/09/2012   Nephrolithiasis 05/22/2011   Mixed hyperlipidemia 11/20/2009   DDD (degenerative disc disease), cervical 05/13/2008    PCP: Everrett Coombe, DO REFERRING PROVIDER: Ramond Marrow, MD REFERRING DIAG: [id number] (ICD-10-CM) - Status post reverse total arthroplasty of left shoulder  THERAPY DIAG:  Acute pain of left shoulder  Muscle weakness (generalized)  Localized edema  Abnormal posture  Difficulty in walking, not elsewhere classified  Rationale for Evaluation and Treatment: Rehabilitation  ONSET DATE: 10/09/23 DATE OF SURGERY  SUBJECTIVE:                                                                                                                                                                                     SUBJECTIVE STATEMENT: Pt states he had a good trip to [place]. Reports continued difficulties with reaching up and getting pain in bicep.   Hand dominance: Right  PERTINENT HISTORY: H/o a-fib, loop recorder, ETOH use, depression  PAIN:  Are you having pain? Yes: NPRS scale: none at rest, 3 at worst/10 Pain location: Lt  lateral shoulder Pain description: dull Aggravating factors: overhead reaching Relieving factors: medicine  PRECAUTIONS: Shoulder and Other: reverse total shoulder replacement  to follow protocol for reverse total shoulder Phase IV 12/18/23 to 01/01/24: Standing AROM, 1-3 lbs weights Phase V: No ROM restrictions  WEIGHT BEARING RESTRICTIONS: Yes < 5 lb per protocol until 01/01/24 and then advance to tolerance    FALLS:  Has patient fallen in last 6 months? Yes. Number of falls 2x in  the past 6 months. He has a h/o drinking and felt like gabapentin + alcohol led to falls.    LIVING ENVIRONMENT: Lives with: lives with their spouse Lives in: House/apartment Stairs:  2 to garage and 2 to deck in back  OCCUPATION: Retired from [employer]; enjoys repairing things  PATIENT GOALS:reduced pain and improve use of the arm "as close to normal as I can get"  NEXT MD VISIT:  01/04/24  OBJECTIVE:  Note: Objective measures were completed at Evaluation unless otherwise noted.  PATIENT SURVEYS:  FOTO 44 11/04/23: 44%  11/13/23: 58% function  12/04/23: 59% function   EDEMA: Pocket of edema in distal medial arm, bruising in proximal biceps bruising and edema noted dressing in place  POSTURE: Rounded shoulders and forward head position  UPPER EXTREMITY ROM:   will assess next visit (< 1 week-- focused on elbow, wrist, positioning today) Passive ROM Left 12/13 11/06/23 Left  11/13/23 Left  11/25/23 Left  11/27/23 Left  12/04/23 Left  12/11/23 Left   Shoulder flexion 75 140 PROM  135 PROM 136 AROM  138 AROM 140 AROM  Shoulder extension         Shoulder abduction  110 PROM 112 PROM   95 AROM 97 AROM  Shoulder adduction         Shoulder internal rotation         Shoulder external rotation 0 32 PROM  42 PROM 45 PROM 56 PROM 30 AROM 36 AROM  Elbow flexion         Elbow extension         Wrist flexion         Wrist extension         Wrist ulnar deviation         Wrist radial deviation         Wrist  pronation         Wrist supination         (Blank rows = not tested)  UPPER EXTREMITY MMT:  wrist flexion/extension grip strength 35 L and 60 R UNABLE-- will assess once protocol allows MMT Right eval Left 12/04/23  Shoulder flexion  3-  Shoulder extension    Shoulder abduction  3-  Shoulder adduction    Shoulder internal rotation  Deferred   Shoulder external rotation  Deferred   Middle trapezius    Lower trapezius    Elbow flexion    Elbow extension    Wrist flexion    Wrist extension    Wrist ulnar deviation    Wrist radial deviation    Wrist pronation    Wrist supination    Grip strength (lbs)    (Blank rows = not tested)  [facility] Adult PT Treatment:                                                DATE: 12/30/23 Therapeutic Exercise: Wall slides flexion, scaption and abduction x10 each Thoracic extension seated against ball x10 Resisted low rows green band 2 x 10  Resisted mid rows green band 2 x 10 Shoulder horizontal abd red band 2 x 10 Manual Therapy: Scar massage STM & TPR L pec minor/major Thoracic PAs for extension grade II to III Neuromuscular re-ed: Elbow against wall "W" 2x10 Seated shoulder flexion AROM x10 Seated shoulder scaption AROM x10 Wall push up plus x10  Serratus anterior slides on wall x10 Modalities: Game ready (vaso) Lt shoulder, 34 deg, med compression x 10 min   [facility] Adult PT Treatment:                                                DATE: 12/11/23 Therapeutic Exercise: Wall slides flexion and abduction x 10 each  Standing shoulder ER AAROM with dowel x 10 Standing shoulder abduction AAROM with dowel x 10  Inclined shoulder flexion AROM; inclined to 45 degrees x 10  Supine shoulder ER AROM in scapular plane x 10 Sidelying shoulder abduction x 10  Resisted rows green band 2 x 10  Supine shoulder flexion 1lb 2 x 10  Supine shoulder scaption 1 lb 2 x 10  Reviewed and updated HEP   Modalities: Game ready (vaso) Lt shoulder, 34 deg, med  compression x 10 min Self Care: Reviewed lifting and ROM restrictions   [facility] Adult PT Treatment:                                                DATE: 12/08/23 Therapeutic Exercise: Pulleys flexion/scaption x 2 min each  Standing resisted row to neutral 2 x 10 green band Standing resisted extension to neutral 2 x 10 red band  Supine shoulder flexion AAROM with dowel x 10; 5 sec hold  Inclined shoulder flexion AROM x 12; 40 degrees inclined  Sidelying shoulder abduction x 10  Supine ER in scapular plane x 10  Finger ladder abduction x 10; 5 sec hold  Manual Therapy: Lt shoulder PROM flexion, abduction, and ER with stretching at available end range   Modalities: Game ready (vaso) Lt shoulder, 34 deg, med compression x 10 min      PATIENT EDUCATION: Education details: HEP update  Person educated: Patient  Education method: Explanation, demo, cues, handout  Education comprehension: verbalized understanding, returned demo, cues   HOME EXERCISE PROGRAM: Access Code: [email or web address] URL: [email or web address] Date: 12/11/2023 Prepared by: Letitia Libra  Exercises - Shoulder Flexion Wall Slide with Towel  - 1 x daily - 7 x weekly - 1 sets - 10 reps - 5 sec  hold - Standing Shoulder Abduction Slides at Wall  - 1 x daily - 7 x weekly - 1 sets - 10 reps - 5 sec  hold - Standing Shoulder Abduction AAROM with Dowel  - 1 x daily - 7 x weekly - 1 sets - 10 reps - 5 sec  hold - Standing Shoulder External Rotation AAROM with Dowel  - 1 x daily - 7 x weekly - 1 sets - 10 reps - 5 sec  hold - Supine Shoulder Flexion Extension Full Range AROM  - 1 x daily - 7 x weekly - 2 sets - 10 reps - Supine Single Arm Shoulder External Rotation AROM  - 1 x daily - 7 x weekly - 2 sets - 10 reps - Sidelying Shoulder Abduction  - 1 x daily - 7 x weekly - 2 sets - 10 reps - Seated Shoulder Scaption AAROM with Pulley at Side  - 1 x daily - 7 x weekly - 2 sets - 10 reps - Standing Shoulder Row with  Anchored Resistance  - 1 x daily -  7 x weekly - 3 sets - 10 reps - Seated Single Arm Bicep Curls with Rotation and Dumbbell  - 1 x daily - 7 x weekly - 2 sets - 10 reps  ASSESSMENT:  CLINICAL IMPRESSION: Session continues to focus on progressing strengthening in more gravity dependent positions. Pt tolerated well. Continued to work on scapular strength at high ranges of shoulder elevation. Working on improving serratus anterior strength. Remains most limited in ER and abd. Cueing to decrease UT compensation at higher ends of shoulder elevation. Manual provided for pt's scar tissue -- some decreased tissue extensibility noted here and pt is reporting some biceps pain.   OBJECTIVE IMPAIRMENTS: decreased activity tolerance, decreased ROM, decreased strength, increased edema, increased fascial restrictions, increased muscle spasms, impaired flexibility, impaired tone, postural dysfunction, and pain.     GOALS: Goals reviewed with patient? Yes  SHORT TERM GOALS: Target date: 11/13/23  The patient will be indep with HEP. Baseline: initiated at eval Goal status: MET  2.  The patient will have PROM documented. Baseline:  began <1 week with elbow and wrist ROM + grip and neck ROM Goal status: MET   LONG TERM GOALS: Target date: 01/31/24  The patient will be indep with HEP. Baseline:  initiated at eval Goal status: progressing   2.  The patient will improve ROM to protocol ranges of flexion to 110-120, ER 30-45. Baseline: not assessed at eval 12/04/23: met PROM, progressing AROM per protocol  Goal status: MET   3.  The patient will tolerate isometrics below shoulder level. Baseline:  not assessed at eval Goal status: MET   4.  The patient will reach into shoulder flexion/scaption to 90 degrees to demo improved strength against gravity. Baseline: not assessed at eval. 11/13/23: deferred due to post-op restrictions  12/04/23: flexion to 138 in supine  Goal status: ongoing    5.  The  patient will reduce pain to < or equal to 2/10. Baseline:  5-9/10 12/04/23: 6 at worst  Goal status: progressing   6.  Pt will improve FOTO to >= 74 Baseline: see above  Goal status: progressing   7. Patient will demonstrate at least 4/5 gross Lt shoulder strength to improve ability to lift and carry items.   Baseline: see MMT chart   Goal status: NEW  PLAN:  PT FREQUENCY: 1-2x/week  PT DURATION: 8 weeks  PLANNED INTERVENTIONS: 97164- PT Re-evaluation, 97110-Therapeutic exercises, 97530- Therapeutic activity, 97112- Neuromuscular re-education, 97535- Self Care, 86578- Manual therapy, 97014- Electrical stimulation (unattended), 97016- Vasopneumatic device, Patient/Family education, Taping, Dry Needling, Joint mobilization, Cryotherapy, and Moist heat  PLAN FOR NEXT SESSION: progress per protocol;    Wayne Brunker April Ma L Ascencion Stegner, PT, DPT 12/30/23 8:48 AM

## 2024-01-01 ENCOUNTER — Ambulatory Visit: Payer: Medicare HMO | Admitting: Physical Therapy

## 2024-01-01 DIAGNOSIS — M6281 Muscle weakness (generalized): Secondary | ICD-10-CM

## 2024-01-01 DIAGNOSIS — R262 Difficulty in walking, not elsewhere classified: Secondary | ICD-10-CM | POA: Diagnosis not present

## 2024-01-01 DIAGNOSIS — M25512 Pain in left shoulder: Secondary | ICD-10-CM

## 2024-01-01 DIAGNOSIS — R293 Abnormal posture: Secondary | ICD-10-CM | POA: Diagnosis not present

## 2024-01-01 DIAGNOSIS — R600 Localized edema: Secondary | ICD-10-CM | POA: Diagnosis not present

## 2024-01-01 NOTE — Therapy (Addendum)
 OUTPATIENT PHYSICAL THERAPY SHOULDER TREATMENT AND PROGRESS NOTE  Progress Note Reporting Period 10/13/23 to 01/01/24  See note below for Objective Data and Assessment of Progress/Goals.       Patient Name: Joshua [name] [facility] MRN: [id number] DOB:31-Jan-1946, 78 y.o., male Today's Date: 01/01/2024  END OF SESSION:  PT End of Session - 01/01/24 0846     Visit Number 20    Number of Visits 32    Date for PT Re-Evaluation 01/31/24    Authorization Type [employer] medicare    Authorization Time Period 2/3-3/26/25    Authorization - Number of Visits 16    Progress Note Due on Visit 20    PT Start Time 0846    PT Stop Time 0925    PT Time Calculation (min) 39 min    Activity Tolerance Patient tolerated treatment well    Behavior During Therapy [facility] for tasks assessed/performed             Past Medical History:  Diagnosis Date   Abnormal findings on diagnostic imaging of lung 09/23/2019   09/22/2019-lung cancer screening-centrilobular and paraseptal emphysema, calcified granulomata noted bilaterally, no suspicious nodules, lung RADS 1, repeat in 12 months    Anxiety    Atrial fibrillation (HCC) 09/23/2019   Centrilobular emphysema (HCC) 09/23/2019   09/22/2019-lung cancer screening-centrilobular and paraseptal emphysema, calcified granulomata noted bilaterally, no suspicious nodules, lung RADS 1, repeat in 12 months  09/23/2019-FVC 3.74 (81% predicted), postbronchodilator ratio 80, postbronchodilator FEV1 2.88 (86% predicted), no bronchodilator response, DLCO 19.44 (73% predicted)   DDD (degenerative disc disease), cervical 05/13/2008   Cervical Disc Degeneration   Depression    ETOH abuse 10/01/2023   continues to drink 1-3 drinks/ day   History of adenomatous polyp of colon 03/03/2018   05/14/17: Colonoscopy: nonadvanced adenoma, f/u 5 yrs, use SuPrep, Murphy/GAP  Last Assessment & Plan:  Relevant Hx: Course: Daily Update: Today's Plan:   History of kidney stones     Hyperlipidemia    Kidney stone    Nephrolithiasis 05/22/2011   Nontraumatic incomplete tear of right rotator cuff 10/14/2018   Other and unspecified hyperlipidemia 11/20/2009   Hyperlipidemia   RBBB 06/09/2012   Secondary hypercoagulable state (HCC) 09/28/2019   Shortness of breath 09/23/2019   Past Surgical History:  Procedure Laterality Date   CARDIOVERSION N/A 10/15/2019   Procedure: CARDIOVERSION;  Surgeon: Jake Bathe, MD;  Location: [facility] ENDOSCOPY;  Service: Cardiovascular;  Laterality: N/A;   CARDIOVERSION N/A 11/29/2019   Procedure: CARDIOVERSION;  Surgeon: Lars Masson, MD;  Location: [facility] ENDOSCOPY;  Service: Cardiovascular;  Laterality: N/A;   HEMORRHOID SURGERY     REVERSE SHOULDER ARTHROPLASTY Left 10/09/2023   Procedure: REVERSE SHOULDER ARTHROPLASTY;  Surgeon: Bjorn Pippin, MD;  Location: [facility];  Service: Orthopedics;  Laterality: Left;   TOTAL HIP ARTHROPLASTY Right 12/21/2021   Procedure: TOTAL HIP ARTHROPLASTY ANTERIOR APPROACH;  Surgeon: Jodi Geralds, MD;  Location: [facility] ORS;  Service: Orthopedics;  Laterality: Right;   VARICOSE VEIN SURGERY Right    Patient Active Problem List   Diagnosis Date Noted   Alcohol-induced mood disorder with depressive symptoms (HCC) 06/10/2023   Benign prostatic hyperplasia without lower urinary tract symptoms 06/05/2023   Dyslipidemia 06/05/2023   Fall 06/05/2023   History of peripheral neuropathy 06/05/2023   Subdural hematoma (HCC) 06/04/2023   Pain in finger of left hand 03/12/2023   Alcohol abuse 05/31/2022   Disorder of thoracic spine 05/31/2022   COVID-19 virus infection 03/04/2022  Acute bronchitis 03/04/2022   OSA (obstructive sleep apnea) 03/04/2022   PAF (paroxysmal atrial fibrillation) (HCC) 12/13/2021   Anxiety 11/08/2021   Aphasia 11/08/2021   Bipolar 1 disorder (HCC) 11/08/2021   CKD (chronic kidney disease), stage III (HCC) 11/08/2021   Expressive aphasia 11/08/2021   Prolonged QT  interval 11/08/2021   Recurrent falls 11/08/2021   Primary osteoarthritis of right hip 10/26/2021   Abnormal liver function tests 09/10/2021   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   Urinary hesitancy 03/08/2021   Thoracic aortic aneurysm without rupture (HCC) 03/08/2021   Senile purpura (HCC) 09/07/2020   Aortic atherosclerosis (HCC) 09/07/2020   Sinus bradycardia 03/31/2020   Essential hypertension 03/31/2020   Major depressive disorder 03/07/2020   Essential tremor 03/07/2020   Tremor 01/21/2020   Obesity (BMI 30-39.9) 12/10/2019   Chest pain of uncertain etiology 12/10/2019   Secondary hypercoagulable state (HCC) 09/28/2019   Centrilobular emphysema (HCC) 09/23/2019   Former smoker 09/23/2019   Abnormal findings on diagnostic imaging of lung 09/23/2019   Atrial fibrillation (HCC) 09/23/2019   At risk for sleep apnea 09/23/2019   Shortness of breath 09/23/2019   Nontraumatic incomplete tear of right rotator cuff 10/14/2018   History of adenomatous polyp of colon 03/03/2018   RBBB 06/09/2012   Nephrolithiasis 05/22/2011   Mixed hyperlipidemia 11/20/2009   DDD (degenerative disc disease), cervical 05/13/2008    PCP: Everrett Coombe, DO REFERRING PROVIDER: Ramond Marrow, MD REFERRING DIAG: [phone] (ICD-10-CM) - Status post reverse total arthroplasty of left shoulder  THERAPY DIAG:  Acute pain of left shoulder  Muscle weakness (generalized)  Localized edema  Abnormal posture  Rationale for Evaluation and Treatment: Rehabilitation  ONSET DATE: 10/09/23 DATE OF SURGERY  SUBJECTIVE:                                                                                                                                                                                     SUBJECTIVE STATEMENT: Pt reports a lot of soreness in L shoulder after last session.   Hand dominance: Right  PERTINENT HISTORY: H/o a-fib, loop recorder, ETOH use, depression  PAIN:  Are you having pain? Yes: NPRS scale:  2/10 Pain location: Lt lateral shoulder Pain description: dull Aggravating factors: overhead reaching Relieving factors: medicine  PRECAUTIONS: Shoulder and Other: reverse total shoulder replacement  to follow protocol for reverse total shoulder Phase IV 12/18/23 to 01/01/24: Standing AROM, 1-3 lbs weights Phase V: No ROM restrictions  WEIGHT BEARING RESTRICTIONS: Yes < 5 lb per protocol until 01/01/24 and then advance to tolerance    FALLS:  Has patient fallen in last 6 months? Yes. Number of falls 2x in the past 6 months.  He has a h/o drinking and felt like gabapentin + alcohol led to falls.    LIVING ENVIRONMENT: Lives with: lives with their spouse Lives in: House/apartment Stairs:  2 to garage and 2 to deck in back  OCCUPATION: Retired from [employer]; enjoys repairing things  PATIENT GOALS:reduced pain and improve use of the arm "as close to normal as I can get"  NEXT MD VISIT:  01/04/24  OBJECTIVE:  Note: Objective measures were completed at Evaluation unless otherwise noted.  PATIENT SURVEYS:  FOTO 44 11/04/23: 44%  11/13/23: 58% function  12/04/23: 59% function   EDEMA: Pocket of edema in distal medial arm, bruising in proximal biceps bruising and edema noted dressing in place  POSTURE: Rounded shoulders and forward head position  UPPER EXTREMITY ROM:    ROM Left 12/13 11/06/23 Left  11/13/23 Left  11/25/23 Left  11/27/23 Left  12/04/23 Left  12/11/23 Left  01/01/24  Shoulder flexion 75 140 PROM  135 PROM 136 AROM  138 AROM 140 AROM 140 AROM sup  Shoulder extension          Shoulder abduction  110 PROM 112 PROM   95 AROM 97 AROM 105 AROM sidelying  Shoulder adduction          Shoulder internal rotation          Shoulder external rotation 0 32 PROM  42 PROM 45 PROM 56 PROM 30 AROM 36 AROM 40 AROM sup  Elbow flexion          Elbow extension          Wrist flexion          Wrist extension          Wrist ulnar deviation          Wrist radial deviation          Wrist  pronation          Wrist supination          (Blank rows = not tested)  UPPER EXTREMITY MMT:  wrist flexion/extension grip strength 35 L and 60 R UNABLE-- will assess once protocol allows MMT Right eval Left 12/04/23  Shoulder flexion  3-  Shoulder extension    Shoulder abduction  3-  Shoulder adduction    Shoulder internal rotation  Deferred   Shoulder external rotation  Deferred   Middle trapezius    Lower trapezius    Elbow flexion    Elbow extension    Wrist flexion    Wrist extension    Wrist ulnar deviation    Wrist radial deviation    Wrist pronation    Wrist supination    Grip strength (lbs)    (Blank rows = not tested)  [facility] Adult PT Treatment:                                                DATE: 01/01/24 Therapeutic Exercise: UBE L1 x 2 min fwd, x2 min bwd Wall slides flexion, scaption and ER x10 each Rechecked AROM Therapeutic Activity: Supine shoulder flexion 2# 2x10 Sidelying shoulder ER 2# 2x10 Sidelying shoulder scaption no weight x10, 2# x10 Sidelying horizontal shoulder abd red TB 2x6 Sidelying bow and arrow red TB 2x10 Modalities: Game ready (vaso) Lt shoulder, 34 deg, med compression x 10 min Self Care: Reviewed Scar massage   [facility] Adult  PT Treatment:                                                DATE: 12/30/23 Therapeutic Exercise: Wall slides flexion, scaption and abduction x10 each Thoracic extension seated against ball x10 Resisted low rows green band 2 x 10  Resisted mid rows green band 2 x 10 Shoulder horizontal abd red band 2 x 10 Manual Therapy: Scar massage STM & TPR L pec minor/major Thoracic PAs for extension grade II to III Neuromuscular re-ed: Elbow against wall "W" 2x10 Seated shoulder flexion AROM x10 Seated shoulder scaption AROM x10 Wall push up plus x10 Serratus anterior slides on wall x10 Modalities: Game ready (vaso) Lt shoulder, 34 deg, med compression x 10 min   [facility] Adult PT Treatment:                                                 DATE: 12/11/23 Therapeutic Exercise: Wall slides flexion and abduction x 10 each  Standing shoulder ER AAROM with dowel x 10 Standing shoulder abduction AAROM with dowel x 10  Inclined shoulder flexion AROM; inclined to 45 degrees x 10  Supine shoulder ER AROM in scapular plane x 10 Sidelying shoulder abduction x 10  Resisted rows green band 2 x 10  Supine shoulder flexion 1lb 2 x 10  Supine shoulder scaption 1 lb 2 x 10  Reviewed and updated HEP   Modalities: Game ready (vaso) Lt shoulder, 34 deg, med compression x 10 min Self Care: Reviewed lifting and ROM restrictions      PATIENT EDUCATION: Education details: HEP update  Person educated: Patient  Education method: Explanation, demo, cues, handout  Education comprehension: verbalized understanding, returned demo, cues   HOME EXERCISE PROGRAM: Access Code: [email or web address] URL: [email or web address] Date: 12/11/2023 Prepared by: Letitia Libra  Exercises - Shoulder Flexion Wall Slide with Towel  - 1 x daily - 7 x weekly - 1 sets - 10 reps - 5 sec  hold - Standing Shoulder Abduction Slides at Wall  - 1 x daily - 7 x weekly - 1 sets - 10 reps - 5 sec  hold - Standing Shoulder Abduction AAROM with Dowel  - 1 x daily - 7 x weekly - 1 sets - 10 reps - 5 sec  hold - Standing Shoulder External Rotation AAROM with Dowel  - 1 x daily - 7 x weekly - 1 sets - 10 reps - 5 sec  hold - Supine Shoulder Flexion Extension Full Range AROM  - 1 x daily - 7 x weekly - 2 sets - 10 reps - Supine Single Arm Shoulder External Rotation AROM  - 1 x daily - 7 x weekly - 2 sets - 10 reps - Sidelying Shoulder Abduction  - 1 x daily - 7 x weekly - 2 sets - 10 reps - Seated Shoulder Scaption AAROM with Pulley at Side  - 1 x daily - 7 x weekly - 2 sets - 10 reps - Standing Shoulder Row with Anchored Resistance  - 1 x daily - 7 x weekly - 3 sets - 10 reps - Seated Single Arm Bicep Curls with Rotation and Dumbbell  - 1 x daily - 7  x weekly -  2 sets - 10 reps  ASSESSMENT:  CLINICAL IMPRESSION: Session focused on gross rotator cuff and scapular strengthening for overhead lifting and increasing AROM. Pt is now allowed to lift more than 5 lbs if he's able. Able to currently tolerate 2#. Pt was very sore after last session -- performed a little less exercises this session.   OBJECTIVE IMPAIRMENTS: decreased activity tolerance, decreased ROM, decreased strength, increased edema, increased fascial restrictions, increased muscle spasms, impaired flexibility, impaired tone, postural dysfunction, and pain.     GOALS: Goals reviewed with patient? Yes  SHORT TERM GOALS: Target date: 11/13/23  The patient will be indep with HEP. Baseline: initiated at eval Goal status: MET  2.  The patient will have PROM documented. Baseline:  began <1 week with elbow and wrist ROM + grip and neck ROM Goal status: MET   LONG TERM GOALS: Target date: 01/31/24  The patient will be indep with HEP. Baseline:  initiated at eval Goal status: progressing   2.  The patient will improve ROM to protocol ranges of flexion to 110-120, ER 30-45. Baseline: not assessed at eval 12/04/23: met PROM, progressing AROM per protocol  Goal status: MET   3.  The patient will tolerate isometrics below shoulder level. Baseline:  not assessed at eval Goal status: MET   4.  The patient will reach into shoulder flexion/scaption to 90 degrees to demo improved strength against gravity. Baseline: not assessed at eval. 11/13/23: deferred due to post-op restrictions  12/04/23: flexion to 138 in supine  Goal status: ongoing    5.  The patient will reduce pain to < or equal to 2/10. Baseline:  5-9/10 12/04/23: 6 at worst  Goal status: progressing   6.  Pt will improve FOTO to >= 74 Baseline: see above  Goal status: progressing   7. Patient will demonstrate at least 4/5 gross Lt shoulder strength to improve ability to lift and carry items.   Baseline: see MMT  chart   Goal status: NEW  PLAN:  PT FREQUENCY: 1-2x/week  PT DURATION: 8 weeks  PLANNED INTERVENTIONS: 97164- PT Re-evaluation, 97110-Therapeutic exercises, 97530- Therapeutic activity, 97112- Neuromuscular re-education, 97535- Self Care, 16109- Manual therapy, 97014- Electrical stimulation (unattended), 97016- Vasopneumatic device, Patient/Family education, Taping, Dry Needling, Joint mobilization, Cryotherapy, and Moist heat  PLAN FOR NEXT SESSION: progress per protocol;    Karron Goens April Ma L Morrisa Aldaba, PT, DPT 01/01/24 8:46 AM

## 2024-01-05 ENCOUNTER — Ambulatory Visit: Payer: Medicare HMO | Attending: Orthopaedic Surgery

## 2024-01-05 DIAGNOSIS — R600 Localized edema: Secondary | ICD-10-CM | POA: Diagnosis not present

## 2024-01-05 DIAGNOSIS — M25512 Pain in left shoulder: Secondary | ICD-10-CM | POA: Insufficient documentation

## 2024-01-05 DIAGNOSIS — R293 Abnormal posture: Secondary | ICD-10-CM | POA: Insufficient documentation

## 2024-01-05 DIAGNOSIS — M6281 Muscle weakness (generalized): Secondary | ICD-10-CM | POA: Insufficient documentation

## 2024-01-05 NOTE — Therapy (Signed)
 OUTPATIENT PHYSICAL THERAPY SHOULDER TREATMENT   Patient Name: Joshua [name] [facility] MRN: [id number] DOB:06-04-1946, 78 y.o., male Today's Date: 01/05/2024  END OF SESSION:  PT End of Session - 01/05/24 0845     Visit Number 21    Number of Visits 32    Date for PT Re-Evaluation 01/31/24    Authorization Type [employer] medicare    Authorization Time Period 2/3-3/26/25    Authorization - Visit Number 5    Authorization - Number of Visits 16    Progress Note Due on Visit --    PT Start Time 0845    PT Stop Time 0936    PT Time Calculation (min) 51 min    Activity Tolerance Patient tolerated treatment well    Behavior During Therapy [facility] for tasks assessed/performed              Past Medical History:  Diagnosis Date   Abnormal findings on diagnostic imaging of lung 09/23/2019   09/22/2019-lung cancer screening-centrilobular and paraseptal emphysema, calcified granulomata noted bilaterally, no suspicious nodules, lung RADS 1, repeat in 12 months    Anxiety    Atrial fibrillation (HCC) 09/23/2019   Centrilobular emphysema (HCC) 09/23/2019   09/22/2019-lung cancer screening-centrilobular and paraseptal emphysema, calcified granulomata noted bilaterally, no suspicious nodules, lung RADS 1, repeat in 12 months  09/23/2019-FVC 3.74 (81% predicted), postbronchodilator ratio 80, postbronchodilator FEV1 2.88 (86% predicted), no bronchodilator response, DLCO 19.44 (73% predicted)   DDD (degenerative disc disease), cervical 05/13/2008   Cervical Disc Degeneration   Depression    ETOH abuse 10/01/2023   continues to drink 1-3 drinks/ day   History of adenomatous polyp of colon 03/03/2018   05/14/17: Colonoscopy: nonadvanced adenoma, f/u 5 yrs, use SuPrep, Murphy/GAP  Last Assessment & Plan:  Relevant Hx: Course: Daily Update: Today's Plan:   History of kidney stones    Hyperlipidemia    Kidney stone    Nephrolithiasis 05/22/2011   Nontraumatic incomplete tear of right rotator cuff  10/14/2018   Other and unspecified hyperlipidemia 11/20/2009   Hyperlipidemia   RBBB 06/09/2012   Secondary hypercoagulable state (HCC) 09/28/2019   Shortness of breath 09/23/2019   Past Surgical History:  Procedure Laterality Date   CARDIOVERSION N/A 10/15/2019   Procedure: CARDIOVERSION;  Surgeon: Jake Bathe, MD;  Location: [facility] ENDOSCOPY;  Service: Cardiovascular;  Laterality: N/A;   CARDIOVERSION N/A 11/29/2019   Procedure: CARDIOVERSION;  Surgeon: Lars Masson, MD;  Location: [facility] ENDOSCOPY;  Service: Cardiovascular;  Laterality: N/A;   HEMORRHOID SURGERY     REVERSE SHOULDER ARTHROPLASTY Left 10/09/2023   Procedure: REVERSE SHOULDER ARTHROPLASTY;  Surgeon: Bjorn Pippin, MD;  Location: [facility];  Service: Orthopedics;  Laterality: Left;   TOTAL HIP ARTHROPLASTY Right 12/21/2021   Procedure: TOTAL HIP ARTHROPLASTY ANTERIOR APPROACH;  Surgeon: Jodi Geralds, MD;  Location: [facility] ORS;  Service: Orthopedics;  Laterality: Right;   VARICOSE VEIN SURGERY Right    Patient Active Problem List   Diagnosis Date Noted   Alcohol-induced mood disorder with depressive symptoms (HCC) 06/10/2023   Benign prostatic hyperplasia without lower urinary tract symptoms 06/05/2023   Dyslipidemia 06/05/2023   Fall 06/05/2023   History of peripheral neuropathy 06/05/2023   Subdural hematoma (HCC) 06/04/2023   Pain in finger of left hand 03/12/2023   Alcohol abuse 05/31/2022   Disorder of thoracic spine 05/31/2022   COVID-19 virus infection 03/04/2022   Acute bronchitis 03/04/2022   OSA (obstructive sleep apnea) 03/04/2022   PAF (paroxysmal atrial  fibrillation) (HCC) 12/13/2021   Anxiety 11/08/2021   Aphasia 11/08/2021   Bipolar 1 disorder (HCC) 11/08/2021   CKD (chronic kidney disease), stage III (HCC) 11/08/2021   Expressive aphasia 11/08/2021   Prolonged QT interval 11/08/2021   Recurrent falls 11/08/2021   Primary osteoarthritis of right hip 10/26/2021   Abnormal liver  function tests 09/10/2021   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   Urinary hesitancy 03/08/2021   Thoracic aortic aneurysm without rupture (HCC) 03/08/2021   Senile purpura (HCC) 09/07/2020   Aortic atherosclerosis (HCC) 09/07/2020   Sinus bradycardia 03/31/2020   Essential hypertension 03/31/2020   Major depressive disorder 03/07/2020   Essential tremor 03/07/2020   Tremor 01/21/2020   Obesity (BMI 30-39.9) 12/10/2019   Chest pain of uncertain etiology 12/10/2019   Secondary hypercoagulable state (HCC) 09/28/2019   Centrilobular emphysema (HCC) 09/23/2019   Former smoker 09/23/2019   Abnormal findings on diagnostic imaging of lung 09/23/2019   Atrial fibrillation (HCC) 09/23/2019   At risk for sleep apnea 09/23/2019   Shortness of breath 09/23/2019   Nontraumatic incomplete tear of right rotator cuff 10/14/2018   History of adenomatous polyp of colon 03/03/2018   RBBB 06/09/2012   Nephrolithiasis 05/22/2011   Mixed hyperlipidemia 11/20/2009   DDD (degenerative disc disease), cervical 05/13/2008    PCP: Everrett Coombe, DO REFERRING PROVIDER: Ramond Marrow, MD REFERRING DIAG: [phone] (ICD-10-CM) - Status post reverse total arthroplasty of left shoulder  THERAPY DIAG:  Acute pain of left shoulder  Muscle weakness (generalized)  Localized edema  Abnormal posture  Rationale for Evaluation and Treatment: Rehabilitation  ONSET DATE: 10/09/23 DATE OF SURGERY  SUBJECTIVE:                                                                                                                                                                                     SUBJECTIVE STATEMENT: Patient reports the new exercises seem to cause more soreness, but the soreness does come and go.   Hand dominance: Right  PERTINENT HISTORY: H/o a-fib, loop recorder, ETOH use, depression  PAIN:  Are you having pain? Yes: NPRS scale: none currently; at worst 2-4/10 Pain location: Lt lateral shoulder Pain description:  sore Aggravating factors: overhead reaching Relieving factors: medicine  PRECAUTIONS: Shoulder and Other: reverse total shoulder replacement  to follow protocol for reverse total shoulder Phase IV 12/18/23 to 01/01/24: Standing AROM, 1-3 lbs weights Phase V: No ROM restrictions  WEIGHT BEARING RESTRICTIONS: Yes < 5 lb per protocol until 01/01/24 and then advance to tolerance    FALLS:  Has patient fallen in last 6 months? Yes. Number of falls 2x in the past 6 months. He has a h/o drinking and  felt like gabapentin + alcohol led to falls.    LIVING ENVIRONMENT: Lives with: lives with their spouse Lives in: House/apartment Stairs:  2 to garage and 2 to deck in back  OCCUPATION: Retired from [employer]; enjoys repairing things  PATIENT GOALS:reduced pain and improve use of the arm "as close to normal as I can get"  NEXT MD VISIT:  01/04/24  OBJECTIVE:  Note: Objective measures were completed at Evaluation unless otherwise noted.  PATIENT SURVEYS:  FOTO 44 11/04/23: 44%  11/13/23: 58% function  12/04/23: 59% function  01/05/24: 66% function   EDEMA: Pocket of edema in distal medial arm, bruising in proximal biceps bruising and edema noted dressing in place  POSTURE: Rounded shoulders and forward head position  UPPER EXTREMITY ROM:    ROM Left 12/13 11/06/23 Left  11/13/23 Left  11/25/23 Left  11/27/23 Left  12/04/23 Left  12/11/23 Left  01/01/24  Shoulder flexion 75 140 PROM  135 PROM 136 AROM  138 AROM 140 AROM 140 AROM sup  Shoulder extension          Shoulder abduction  110 PROM 112 PROM   95 AROM 97 AROM 105 AROM sidelying  Shoulder adduction          Shoulder internal rotation          Shoulder external rotation 0 32 PROM  42 PROM 45 PROM 56 PROM 30 AROM 36 AROM 40 AROM sup  Elbow flexion          Elbow extension          Wrist flexion          Wrist extension          Wrist ulnar deviation          Wrist radial deviation          Wrist pronation          Wrist supination           (Blank rows = not tested)  UPPER EXTREMITY MMT:  wrist flexion/extension grip strength 35 L and 60 R UNABLE-- will assess once protocol allows MMT Right eval Left 12/04/23  Shoulder flexion  3-  Shoulder extension    Shoulder abduction  3-  Shoulder adduction    Shoulder internal rotation  Deferred   Shoulder external rotation  Deferred   Middle trapezius    Lower trapezius    Elbow flexion    Elbow extension    Wrist flexion    Wrist extension    Wrist ulnar deviation    Wrist radial deviation    Wrist pronation    Wrist supination    Grip strength (lbs)    (Blank rows = not tested) [facility] Adult PT Treatment:                                                DATE: 01/05/24  Neuromuscular re-ed: Seated shoulder abduction AROM with elbow bent and mirror for visual feedback to prevent shoulder shrug x 10  Seated shoulder flexion AROM with mirror for visual feedback to prevent shoulder shrug x 10  Resisted shoulder extension red band 2 x 10  Therapeutic Activity: Pulleys flexion and scaption 2 min each  Inclined shoulder flexion @ 50 degrees x 10; 2 x 10 @ 2 lbs  Shoulder extension AAROM with dowel x 10  Shoulder functional IR with towel x 10  Modalities: Game ready (vaso) Lt shoulder, 34 deg, med compression x 10 min   [facility] Adult PT Treatment:                                                DATE: 01/01/24 Therapeutic Exercise: UBE L1 x 2 min fwd, x2 min bwd Wall slides flexion, scaption and ER x10 each Rechecked AROM Therapeutic Activity: Supine shoulder flexion 2# 2x10 Sidelying shoulder ER 2# 2x10 Sidelying shoulder scaption no weight x10, 2# x10 Sidelying horizontal shoulder abd red TB 2x6 Sidelying bow and arrow red TB 2x10 Modalities: Game ready (vaso) Lt shoulder, 34 deg, med compression x 10 min Self Care: Reviewed Scar massage   [facility] Adult PT Treatment:                                                DATE: 12/30/23 Therapeutic Exercise: Wall slides flexion,  scaption and abduction x10 each Thoracic extension seated against ball x10 Resisted low rows green band 2 x 10  Resisted mid rows green band 2 x 10 Shoulder horizontal abd red band 2 x 10 Manual Therapy: Scar massage STM & TPR L pec minor/major Thoracic PAs for extension grade II to III Neuromuscular re-ed: Elbow against wall "W" 2x10 Seated shoulder flexion AROM x10 Seated shoulder scaption AROM x10 Wall push up plus x10 Serratus anterior slides on wall x10 Modalities: Game ready (vaso) Lt shoulder, 34 deg, med compression x 10 min      PATIENT EDUCATION: Education details: HEP review  Person educated: Patient  Education method: Programmer, multimedia, Education comprehension: verbalized understanding,  HOME EXERCISE PROGRAM: Access Code: [email or web address] URL: [email or web address] Date: 12/11/2023 Prepared by: Letitia Libra  Exercises - Shoulder Flexion Wall Slide with Towel  - 1 x daily - 7 x weekly - 1 sets - 10 reps - 5 sec  hold - Standing Shoulder Abduction Slides at Wall  - 1 x daily - 7 x weekly - 1 sets - 10 reps - 5 sec  hold - Standing Shoulder Abduction AAROM with Dowel  - 1 x daily - 7 x weekly - 1 sets - 10 reps - 5 sec  hold - Standing Shoulder External Rotation AAROM with Dowel  - 1 x daily - 7 x weekly - 1 sets - 10 reps - 5 sec  hold - Supine Shoulder Flexion Extension Full Range AROM  - 1 x daily - 7 x weekly - 2 sets - 10 reps - Supine Single Arm Shoulder External Rotation AROM  - 1 x daily - 7 x weekly - 2 sets - 10 reps - Sidelying Shoulder Abduction  - 1 x daily - 7 x weekly - 2 sets - 10 reps - Seated Shoulder Scaption AAROM with Pulley at Side  - 1 x daily - 7 x weekly - 2 sets - 10 reps - Standing Shoulder Row with Anchored Resistance  - 1 x daily - 7 x weekly - 3 sets - 10 reps - Seated Single Arm Bicep Curls with Rotation and Dumbbell  - 1 x daily - 7 x weekly - 2 sets - 10 reps  ASSESSMENT:  CLINICAL IMPRESSION: Continued with LUE ROM and  strength progression with good tolerance. Progressed resisted inclined shoulder flexion with light resistance with patient demonstrating proper form through approximately 140 degrees, though does note fatigue. Introduced extension and IR AAROM through partial range as patient is now 12+ weeks post-op with good tolerance. Utilized Ship broker for [employer] with progression to seated AROM to assist with reducing upper trap compensation through partial range of flexion and abduction.   OBJECTIVE IMPAIRMENTS: decreased activity tolerance, decreased ROM, decreased strength, increased edema, increased fascial restrictions, increased muscle spasms, impaired flexibility, impaired tone, postural dysfunction, and pain.     GOALS: Goals reviewed with patient? Yes  SHORT TERM GOALS: Target date: 11/13/23  The patient will be indep with HEP. Baseline: initiated at eval Goal status: MET  2.  The patient will have PROM documented. Baseline:  began <1 week with elbow and wrist ROM + grip and neck ROM Goal status: MET   LONG TERM GOALS: Target date: 01/31/24  The patient will be indep with HEP. Baseline:  initiated at eval Goal status: progressing   2.  The patient will improve ROM to protocol ranges of flexion to 110-120, ER 30-45. Baseline: not assessed at eval 12/04/23: met PROM, progressing AROM per protocol  Goal status: MET   3.  The patient will tolerate isometrics below shoulder level. Baseline:  not assessed at eval Goal status: MET   4.  The patient will reach into shoulder flexion/scaption to 90 degrees to demo improved strength against gravity. Baseline: not assessed at eval. 11/13/23: deferred due to post-op restrictions  12/04/23: flexion to 138 in supine  Goal status: ongoing    5.  The patient will reduce pain to < or equal to 2/10. Baseline:  5-9/10 12/04/23: 6 at worst  Goal status: progressing   6.  Pt will improve FOTO to >= 74 Baseline: see above  Goal status: progressing    7. Patient will demonstrate at least 4/5 gross Lt shoulder strength to improve ability to lift and carry items.   Baseline: see MMT chart   Goal status: NEW  PLAN:  PT FREQUENCY: 1-2x/week  PT DURATION: 8 weeks  PLANNED INTERVENTIONS: 97164- PT Re-evaluation, 97110-Therapeutic exercises, 97530- Therapeutic activity, 97112- Neuromuscular re-education, 97535- Self Care, 92010- Manual therapy, 97014- Electrical stimulation (unattended), 97016- Vasopneumatic device, Patient/Family education, Taping, Dry Needling, Joint mobilization, Cryotherapy, and Moist heat  PLAN FOR NEXT SESSION: progress per protocol;   Letitia Libra, PT, DPT, ATC 01/05/24 9:28 AM

## 2024-01-08 ENCOUNTER — Ambulatory Visit: Payer: Medicare HMO

## 2024-01-08 DIAGNOSIS — R293 Abnormal posture: Secondary | ICD-10-CM | POA: Diagnosis not present

## 2024-01-08 DIAGNOSIS — R600 Localized edema: Secondary | ICD-10-CM | POA: Diagnosis not present

## 2024-01-08 DIAGNOSIS — M25512 Pain in left shoulder: Secondary | ICD-10-CM | POA: Diagnosis not present

## 2024-01-08 DIAGNOSIS — M6281 Muscle weakness (generalized): Secondary | ICD-10-CM

## 2024-01-08 DIAGNOSIS — M19012 Primary osteoarthritis, left shoulder: Secondary | ICD-10-CM | POA: Diagnosis not present

## 2024-01-08 NOTE — Therapy (Signed)
 OUTPATIENT PHYSICAL THERAPY SHOULDER TREATMENT   Patient Name: Joshua [name] [facility] MRN: [id number] DOB:11/08/1945, 78 y.o., male Today's Date: 01/08/2024  END OF SESSION:  PT End of Session - 01/08/24 0845     Visit Number 22    Number of Visits 32    Date for PT Re-Evaluation 01/31/24    Authorization Type [employer] medicare    Authorization Time Period 2/3-3/26/25    Authorization - Visit Number 6    Authorization - Number of Visits 16    PT Start Time 0845    PT Stop Time 0934    PT Time Calculation (min) 49 min    Activity Tolerance Patient tolerated treatment well    Behavior During Therapy [facility] for tasks assessed/performed               Past Medical History:  Diagnosis Date   Abnormal findings on diagnostic imaging of lung 09/23/2019   09/22/2019-lung cancer screening-centrilobular and paraseptal emphysema, calcified granulomata noted bilaterally, no suspicious nodules, lung RADS 1, repeat in 12 months    Anxiety    Atrial fibrillation (HCC) 09/23/2019   Centrilobular emphysema (HCC) 09/23/2019   09/22/2019-lung cancer screening-centrilobular and paraseptal emphysema, calcified granulomata noted bilaterally, no suspicious nodules, lung RADS 1, repeat in 12 months  09/23/2019-FVC 3.74 (81% predicted), postbronchodilator ratio 80, postbronchodilator FEV1 2.88 (86% predicted), no bronchodilator response, DLCO 19.44 (73% predicted)   DDD (degenerative disc disease), cervical 05/13/2008   Cervical Disc Degeneration   Depression    ETOH abuse 10/01/2023   continues to drink 1-3 drinks/ day   History of adenomatous polyp of colon 03/03/2018   05/14/17: Colonoscopy: nonadvanced adenoma, f/u 5 yrs, use SuPrep, Murphy/GAP  Last Assessment & Plan:  Relevant Hx: Course: Daily Update: Today's Plan:   History of kidney stones    Hyperlipidemia    Kidney stone    Nephrolithiasis 05/22/2011   Nontraumatic incomplete tear of right rotator cuff 10/14/2018   Other and unspecified  hyperlipidemia 11/20/2009   Hyperlipidemia   RBBB 06/09/2012   Secondary hypercoagulable state (HCC) 09/28/2019   Shortness of breath 09/23/2019   Past Surgical History:  Procedure Laterality Date   CARDIOVERSION N/A 10/15/2019   Procedure: CARDIOVERSION;  Surgeon: Jake Bathe, MD;  Location: [facility] ENDOSCOPY;  Service: Cardiovascular;  Laterality: N/A;   CARDIOVERSION N/A 11/29/2019   Procedure: CARDIOVERSION;  Surgeon: Lars Masson, MD;  Location: [facility] ENDOSCOPY;  Service: Cardiovascular;  Laterality: N/A;   HEMORRHOID SURGERY     REVERSE SHOULDER ARTHROPLASTY Left 10/09/2023   Procedure: REVERSE SHOULDER ARTHROPLASTY;  Surgeon: Bjorn Pippin, MD;  Location: [facility];  Service: Orthopedics;  Laterality: Left;   TOTAL HIP ARTHROPLASTY Right 12/21/2021   Procedure: TOTAL HIP ARTHROPLASTY ANTERIOR APPROACH;  Surgeon: Jodi Geralds, MD;  Location: [facility] ORS;  Service: Orthopedics;  Laterality: Right;   VARICOSE VEIN SURGERY Right    Patient Active Problem List   Diagnosis Date Noted   Alcohol-induced mood disorder with depressive symptoms (HCC) 06/10/2023   Benign prostatic hyperplasia without lower urinary tract symptoms 06/05/2023   Dyslipidemia 06/05/2023   Fall 06/05/2023   History of peripheral neuropathy 06/05/2023   Subdural hematoma (HCC) 06/04/2023   Pain in finger of left hand 03/12/2023   Alcohol abuse 05/31/2022   Disorder of thoracic spine 05/31/2022   COVID-19 virus infection 03/04/2022   Acute bronchitis 03/04/2022   OSA (obstructive sleep apnea) 03/04/2022   PAF (paroxysmal atrial fibrillation) (HCC) 12/13/2021   Anxiety 11/08/2021  Aphasia 11/08/2021   Bipolar 1 disorder (HCC) 11/08/2021   CKD (chronic kidney disease), stage III (HCC) 11/08/2021   Expressive aphasia 11/08/2021   Prolonged QT interval 11/08/2021   Recurrent falls 11/08/2021   Primary osteoarthritis of right hip 10/26/2021   Abnormal liver function tests 09/10/2021   Mild CAD  03/27/2021   Urinary hesitancy 03/08/2021   Thoracic aortic aneurysm without rupture (HCC) 03/08/2021   Senile purpura (HCC) 09/07/2020   Aortic atherosclerosis (HCC) 09/07/2020   Sinus bradycardia 03/31/2020   Essential hypertension 03/31/2020   Major depressive disorder 03/07/2020   Essential tremor 03/07/2020   Tremor 01/21/2020   Obesity (BMI 30-39.9) 12/10/2019   Chest pain of uncertain etiology 12/10/2019   Secondary hypercoagulable state (HCC) 09/28/2019   Centrilobular emphysema (HCC) 09/23/2019   Former smoker 09/23/2019   Abnormal findings on diagnostic imaging of lung 09/23/2019   Atrial fibrillation (HCC) 09/23/2019   At risk for sleep apnea 09/23/2019   Shortness of breath 09/23/2019   Nontraumatic incomplete tear of right rotator cuff 10/14/2018   History of adenomatous polyp of colon 03/03/2018   RBBB 06/09/2012   Nephrolithiasis 05/22/2011   Mixed hyperlipidemia 11/20/2009   DDD (degenerative disc disease), cervical 05/13/2008    PCP: Everrett Coombe, DO REFERRING PROVIDER: Ramond Marrow, MD REFERRING DIAG: [id number] (ICD-10-CM) - Status post reverse total arthroplasty of left shoulder  THERAPY DIAG:  Acute pain of left shoulder  Muscle weakness (generalized)  Localized edema  Abnormal posture  Rationale for Evaluation and Treatment: Rehabilitation  ONSET DATE: 10/09/23 DATE OF SURGERY  SUBJECTIVE:                                                                                                                                                                                     SUBJECTIVE STATEMENT: "It hurts today. It's sore."   Hand dominance: Right  PERTINENT HISTORY: H/o a-fib, loop recorder, ETOH use, depression  PAIN:  Are you having pain? Yes: NPRS scale: 4/10 Pain location: Lt lateral shoulder Pain description: sore Aggravating factors: overhead reaching Relieving factors: medicine  PRECAUTIONS: Shoulder and Other: reverse total shoulder  replacement  to follow protocol for reverse total shoulder Phase IV 12/18/23 to 01/01/24: Standing AROM, 1-3 lbs weights Phase V: No ROM restrictions  WEIGHT BEARING RESTRICTIONS: Yes < 5 lb per protocol until 01/01/24 and then advance to tolerance    FALLS:  Has patient fallen in last 6 months? Yes. Number of falls 2x in the past 6 months. He has a h/o drinking and felt like gabapentin + alcohol led to falls.    LIVING ENVIRONMENT: Lives with: lives with their spouse Lives in: House/apartment Stairs:  2  to garage and 2 to deck in back  OCCUPATION: Retired from [employer]; enjoys repairing things  PATIENT GOALS:reduced pain and improve use of the arm "as close to normal as I can get"  NEXT MD VISIT:  01/04/24  OBJECTIVE:  Note: Objective measures were completed at Evaluation unless otherwise noted.  PATIENT SURVEYS:  FOTO 44 11/04/23: 44%  11/13/23: 58% function  12/04/23: 59% function  01/05/24: 66% function   EDEMA: Pocket of edema in distal medial arm, bruising in proximal biceps bruising and edema noted dressing in place  POSTURE: Rounded shoulders and forward head position  UPPER EXTREMITY ROM:    ROM Left 12/13 11/06/23 Left  11/13/23 Left  11/25/23 Left  11/27/23 Left  12/04/23 Left  12/11/23 Left  01/01/24 01/08/24 Left   Shoulder flexion 75 140 PROM  135 PROM 136 AROM  138 AROM 140 AROM 140 AROM sup 145 AROM supine   Shoulder extension           Shoulder abduction  110 PROM 112 PROM   95 AROM 97 AROM 105 AROM sidelying 105 AROM supine  Shoulder adduction           Shoulder internal rotation           Shoulder external rotation 0 32 PROM  42 PROM 45 PROM 56 PROM 30 AROM 36 AROM 40 AROM sup 45 AROM supine   Elbow flexion           Elbow extension           Wrist flexion           Wrist extension           Wrist ulnar deviation           Wrist radial deviation           Wrist pronation           Wrist supination           (Blank rows = not tested)  UPPER EXTREMITY MMT:   wrist flexion/extension grip strength 35 L and 60 R UNABLE-- will assess once protocol allows MMT Right eval Left 12/04/23  Shoulder flexion  3-  Shoulder extension    Shoulder abduction  3-  Shoulder adduction    Shoulder internal rotation  Deferred   Shoulder external rotation  Deferred   Middle trapezius    Lower trapezius    Elbow flexion    Elbow extension    Wrist flexion    Wrist extension    Wrist ulnar deviation    Wrist radial deviation    Wrist pronation    Wrist supination    Grip strength (lbs)    (Blank rows = not tested) [facility] Adult PT Treatment:                                                DATE: 01/08/24  Neuromuscular re-ed: Sidelying ER x 10  Sidelying shoulder abduction x 10 @ 2 lbs  Bilateral shoulder ER x 10  Therapeutic Activity: Pulleys 2 min each flexion/scaption  Inclined shoulder flexion @ 50 degrees x 10 @ 2 lbs  Seated shoulder flexion AROM x 10  Seated shoulder abduction AROM x 10 elbow bent  Functional IR reach x 5  Modalities: Game ready (vaso) Lt shoulder, 34 deg, med compression x 10 min  [facility] Adult PT Treatment:                                                DATE: 01/05/24  Neuromuscular re-ed: Seated shoulder abduction AROM with elbow bent and mirror for visual feedback to prevent shoulder shrug x 10  Seated shoulder flexion AROM with mirror for visual feedback to prevent shoulder shrug x 10  Resisted shoulder extension red band 2 x 10  Therapeutic Activity: Pulleys flexion and scaption 2 min each  Inclined shoulder flexion @ 50 degrees x 10; 2 x 10 @ 2 lbs  Shoulder extension AAROM with dowel x 10  Shoulder functional IR with towel x 10  Modalities: Game ready (vaso) Lt shoulder, 34 deg, med compression x 10 min   [facility] Adult PT Treatment:                                                DATE: 01/01/24 Therapeutic Exercise: UBE L1 x 2 min fwd, x2 min bwd Wall slides flexion, scaption and ER x10 each Rechecked AROM Therapeutic  Activity: Supine shoulder flexion 2# 2x10 Sidelying shoulder ER 2# 2x10 Sidelying shoulder scaption no weight x10, 2# x10 Sidelying horizontal shoulder abd red TB 2x6 Sidelying bow and arrow red TB 2x10 Modalities: Game ready (vaso) Lt shoulder, 34 deg, med compression x 10 min Self Care: Reviewed Scar massage      PATIENT EDUCATION: Education details: HEP update  Person educated: Patient  Education method: Explanation,demo, cues, handout Education comprehension: verbalized understanding, returned demo, cues   HOME EXERCISE PROGRAM: Access Code: [email or web address] URL: [email or web address] Date: 01/08/2024 Prepared by: Letitia Libra  Exercises - Seated Shoulder Scaption AAROM with Pulley at Side  - 1 x daily - 7 x weekly - 2 sets - 10 reps - Shoulder Flexion Wall Slide with Towel  - 1 x daily - 7 x weekly - 1 sets - 10 reps - 5 sec  hold - Standing Shoulder Abduction Slides at Wall  - 1 x daily - 7 x weekly - 1 sets - 10 reps - 5 sec  hold - Sidelying Shoulder Abduction  - 1 x daily - 7 x weekly - 2 sets - 10 reps - Sidelying Shoulder External Rotation  - 1 x daily - 7 x weekly - 3 sets - 10 reps - Prone Shoulder Extension - Single Arm  - 1 x daily - 7 x weekly - 2 sets - 10 reps - Standing Shoulder Row with Anchored Resistance  - 1 x daily - 7 x weekly - 3 sets - 10 reps - Seated Single Arm Bicep Curls with Rotation and Dumbbell  - 1 x daily - 7 x weekly - 2 sets - 10 reps - Standing Shoulder Flexion to 90 Degrees  - 1 x daily - 7 x weekly - 3 sets - 5 reps - Shoulder External Rotation and Scapular Retraction  - 1 x daily - 7 x weekly - 2 sets - 10 reps  ASSESSMENT:  CLINICAL IMPRESSION: Patient tolerated session well today with progression of ROM and strengthening in more gravity dependent positioning. Fatigues with gravity minimized strengthening, but able to maintain good form with resisted inclined shoulder flexion and sidelying shoulder abduction.  AROM continues  to gradually improve (see measurements above). With seated shoulder flexion he is able to achieve 100 degrees before notable shoulder shrug is present, though does fatigue quickly with this exercise.   OBJECTIVE IMPAIRMENTS: decreased activity tolerance, decreased ROM, decreased strength, increased edema, increased fascial restrictions, increased muscle spasms, impaired flexibility, impaired tone, postural dysfunction, and pain.     GOALS: Goals reviewed with patient? Yes  SHORT TERM GOALS: Target date: 11/13/23  The patient will be indep with HEP. Baseline: initiated at eval Goal status: MET  2.  The patient will have PROM documented. Baseline:  began <1 week with elbow and wrist ROM + grip and neck ROM Goal status: MET   LONG TERM GOALS: Target date: 01/31/24  The patient will be indep with HEP. Baseline:  initiated at eval Goal status: progressing   2.  The patient will improve ROM to protocol ranges of flexion to 110-120, ER 30-45. Baseline: not assessed at eval 12/04/23: met PROM, progressing AROM per protocol  Goal status: MET   3.  The patient will tolerate isometrics below shoulder level. Baseline:  not assessed at eval Goal status: MET   4.  The patient will reach into shoulder flexion/scaption to 90 degrees to demo improved strength against gravity. Baseline: not assessed at eval. 11/13/23: deferred due to post-op restrictions  12/04/23: flexion to 138 in supine  Goal status: ongoing    5.  The patient will reduce pain to < or equal to 2/10. Baseline:  5-9/10 12/04/23: 6 at worst  Goal status: progressing   6.  Pt will improve FOTO to >= 74 Baseline: see above  Goal status: progressing   7. Patient will demonstrate at least 4/5 gross Lt shoulder strength to improve ability to lift and carry items.   Baseline: see MMT chart   Goal status: NEW  PLAN:  PT FREQUENCY: 1-2x/week  PT DURATION: 8 weeks  PLANNED INTERVENTIONS: 97164- PT Re-evaluation,  97110-Therapeutic exercises, 97530- Therapeutic activity, 97112- Neuromuscular re-education, 97535- Self Care, 16109- Manual therapy, 97014- Electrical stimulation (unattended), 97016- Vasopneumatic device, Patient/Family education, Taping, Dry Needling, Joint mobilization, Cryotherapy, and Moist heat  PLAN FOR NEXT SESSION: progress per protocol;   Letitia Libra, PT, DPT, ATC 01/08/24 9:27 AM

## 2024-01-12 ENCOUNTER — Ambulatory Visit: Payer: Medicare HMO

## 2024-01-12 DIAGNOSIS — M6281 Muscle weakness (generalized): Secondary | ICD-10-CM

## 2024-01-12 DIAGNOSIS — R600 Localized edema: Secondary | ICD-10-CM | POA: Diagnosis not present

## 2024-01-12 DIAGNOSIS — M25512 Pain in left shoulder: Secondary | ICD-10-CM

## 2024-01-12 DIAGNOSIS — R293 Abnormal posture: Secondary | ICD-10-CM

## 2024-01-12 NOTE — Therapy (Signed)
 OUTPATIENT PHYSICAL THERAPY SHOULDER TREATMENT   Patient Name: Joshua [name] [facility] MRN: [id number] DOB:Apr 08, 1946, 78 y.o., male Today's Date: 01/12/2024  END OF SESSION:  PT End of Session - 01/12/24 0848     Visit Number 23    Number of Visits 32    Date for PT Re-Evaluation 01/31/24    Authorization Type [employer] medicare    Authorization Time Period 2/3-3/26/25    Authorization - Visit Number 7    Authorization - Number of Visits 16    PT Start Time 0847    PT Stop Time 0939    PT Time Calculation (min) 52 min    Activity Tolerance Patient tolerated treatment well    Behavior During Therapy [name] for tasks assessed/performed                Past Medical History:  Diagnosis Date   Abnormal findings on diagnostic imaging of lung 09/23/2019   09/22/2019-lung cancer screening-centrilobular and paraseptal emphysema, calcified granulomata noted bilaterally, no suspicious nodules, lung RADS 1, repeat in 12 months    Anxiety    Atrial fibrillation (HCC) 09/23/2019   Centrilobular emphysema (HCC) 09/23/2019   09/22/2019-lung cancer screening-centrilobular and paraseptal emphysema, calcified granulomata noted bilaterally, no suspicious nodules, lung RADS 1, repeat in 12 months  09/23/2019-FVC 3.74 (81% predicted), postbronchodilator ratio 80, postbronchodilator FEV1 2.88 (86% predicted), no bronchodilator response, DLCO 19.44 (73% predicted)   DDD (degenerative disc disease), cervical 05/13/2008   Cervical Disc Degeneration   Depression    ETOH abuse 10/01/2023   continues to drink 1-3 drinks/ day   History of adenomatous polyp of colon 03/03/2018   05/14/17: Colonoscopy: nonadvanced adenoma, f/u 5 yrs, use SuPrep, Murphy/GAP  Last Assessment & Plan:  Relevant Hx: Course: Daily Update: Today's Plan:   History of kidney stones    Hyperlipidemia    Kidney stone    Nephrolithiasis 05/22/2011   Nontraumatic incomplete tear of right rotator cuff 10/14/2018   Other and unspecified  hyperlipidemia 11/20/2009   Hyperlipidemia   RBBB 06/09/2012   Secondary hypercoagulable state (HCC) 09/28/2019   Shortness of breath 09/23/2019   Past Surgical History:  Procedure Laterality Date   CARDIOVERSION N/A 10/15/2019   Procedure: CARDIOVERSION;  Surgeon: Jake Bathe, MD;  Location: [facility] ENDOSCOPY;  Service: Cardiovascular;  Laterality: N/A;   CARDIOVERSION N/A 11/29/2019   Procedure: CARDIOVERSION;  Surgeon: Lars Masson, MD;  Location: [facility] ENDOSCOPY;  Service: Cardiovascular;  Laterality: N/A;   HEMORRHOID SURGERY     REVERSE SHOULDER ARTHROPLASTY Left 10/09/2023   Procedure: REVERSE SHOULDER ARTHROPLASTY;  Surgeon: Bjorn Pippin, MD;  Location: [facility];  Service: Orthopedics;  Laterality: Left;   TOTAL HIP ARTHROPLASTY Right 12/21/2021   Procedure: TOTAL HIP ARTHROPLASTY ANTERIOR APPROACH;  Surgeon: Jodi Geralds, MD;  Location: [facility] ORS;  Service: Orthopedics;  Laterality: Right;   VARICOSE VEIN SURGERY Right    Patient Active Problem List   Diagnosis Date Noted   Alcohol-induced mood disorder with depressive symptoms (HCC) 06/10/2023   Benign prostatic hyperplasia without lower urinary tract symptoms 06/05/2023   Dyslipidemia 06/05/2023   Fall 06/05/2023   History of peripheral neuropathy 06/05/2023   Subdural hematoma (HCC) 06/04/2023   Pain in finger of left hand 03/12/2023   Alcohol abuse 05/31/2022   Disorder of thoracic spine 05/31/2022   COVID-19 virus infection 03/04/2022   Acute bronchitis 03/04/2022   OSA (obstructive sleep apnea) 03/04/2022   PAF (paroxysmal atrial fibrillation) (HCC) 12/13/2021   Anxiety 11/08/2021  Aphasia 11/08/2021   Bipolar 1 disorder (HCC) 11/08/2021   CKD (chronic kidney disease), stage III (HCC) 11/08/2021   Expressive aphasia 11/08/2021   Prolonged QT interval 11/08/2021   Recurrent falls 11/08/2021   Primary osteoarthritis of right hip 10/26/2021   Abnormal liver function tests 09/10/2021   Mild CAD  03/27/2021   Urinary hesitancy 03/08/2021   Thoracic aortic aneurysm without rupture (HCC) 03/08/2021   Senile purpura (HCC) 09/07/2020   Aortic atherosclerosis (HCC) 09/07/2020   Sinus bradycardia 03/31/2020   Essential hypertension 03/31/2020   Major depressive disorder 03/07/2020   Essential tremor 03/07/2020   Tremor 01/21/2020   Obesity (BMI 30-39.9) 12/10/2019   Chest pain of uncertain etiology 12/10/2019   Secondary hypercoagulable state (HCC) 09/28/2019   Centrilobular emphysema (HCC) 09/23/2019   Former smoker 09/23/2019   Abnormal findings on diagnostic imaging of lung 09/23/2019   Atrial fibrillation (HCC) 09/23/2019   At risk for sleep apnea 09/23/2019   Shortness of breath 09/23/2019   Nontraumatic incomplete tear of right rotator cuff 10/14/2018   History of adenomatous polyp of colon 03/03/2018   RBBB 06/09/2012   Nephrolithiasis 05/22/2011   Mixed hyperlipidemia 11/20/2009   DDD (degenerative disc disease), cervical 05/13/2008    PCP: Joshua Coombe, DO REFERRING PROVIDER: Ramond Marrow, MD REFERRING DIAG: [phone] (ICD-10-CM) - Status post reverse total arthroplasty of left shoulder  THERAPY DIAG:  Acute pain of left shoulder  Muscle weakness (generalized)  Localized edema  Abnormal posture  Rationale for Evaluation and Treatment: Rehabilitation  ONSET DATE: 10/09/23 DATE OF SURGERY  SUBJECTIVE:                                                                                                                                                                                     SUBJECTIVE STATEMENT: "Doing better." He had f/u with Dr. Everardo [name] who is pleased with his overall progress. Next f/u is in 9 months.   Hand dominance: Right  PERTINENT HISTORY: H/o a-fib, loop recorder, ETOH use, depression  PAIN:  Are you having pain? Yes: NPRS scale: none currently; 4 at worst/10 Pain location: Lt lateral shoulder Pain description: sore Aggravating factors:  overhead reaching Relieving factors: medicine  PRECAUTIONS: Shoulder and Other: reverse total shoulder replacement  to follow protocol for reverse total shoulder Phase IV 12/18/23 to 01/01/24: Standing AROM, 1-3 lbs weights Phase V: No ROM restrictions  WEIGHT BEARING RESTRICTIONS: Yes < 5 lb per protocol until 01/01/24 and then advance to tolerance    FALLS:  Has patient fallen in last 6 months? Yes. Number of falls 2x in the past 6 months. He has a h/o drinking and felt like gabapentin + alcohol  led to falls.    LIVING ENVIRONMENT: Lives with: lives with their spouse Lives in: House/apartment Stairs:  2 to garage and 2 to deck in back  OCCUPATION: Retired from [employer]; enjoys repairing things  PATIENT GOALS:reduced pain and improve use of the arm "as close to normal as I can get"  NEXT MD VISIT:  01/04/24  OBJECTIVE:  Note: Objective measures were completed at Evaluation unless otherwise noted.  PATIENT SURVEYS:  FOTO 44 11/04/23: 44%  11/13/23: 58% function  12/04/23: 59% function  01/05/24: 66% function   EDEMA: Pocket of edema in distal medial arm, bruising in proximal biceps bruising and edema noted dressing in place  POSTURE: Rounded shoulders and forward head position  UPPER EXTREMITY ROM:    ROM Left 12/13 11/06/23 Left  11/13/23 Left  11/25/23 Left  11/27/23 Left  12/04/23 Left  12/11/23 Left  01/01/24 01/08/24 Left   Shoulder flexion 75 140 PROM  135 PROM 136 AROM  138 AROM 140 AROM 140 AROM sup 145 AROM supine   Shoulder extension           Shoulder abduction  110 PROM 112 PROM   95 AROM 97 AROM 105 AROM sidelying 105 AROM supine  Shoulder adduction           Shoulder internal rotation           Shoulder external rotation 0 32 PROM  42 PROM 45 PROM 56 PROM 30 AROM 36 AROM 40 AROM sup 45 AROM supine   Elbow flexion           Elbow extension           Wrist flexion           Wrist extension           Wrist ulnar deviation           Wrist radial deviation            Wrist pronation           Wrist supination           (Blank rows = not tested)  UPPER EXTREMITY MMT:  wrist flexion/extension grip strength 35 L and 60 R UNABLE-- will assess once protocol allows MMT Right eval Left 12/04/23  Shoulder flexion  3-  Shoulder extension    Shoulder abduction  3-  Shoulder adduction    Shoulder internal rotation  Deferred   Shoulder external rotation  Deferred   Middle trapezius    Lower trapezius    Elbow flexion    Elbow extension    Wrist flexion    Wrist extension    Wrist ulnar deviation    Wrist radial deviation    Wrist pronation    Wrist supination    Grip strength (lbs)    (Blank rows = not tested) [facility] Adult PT Treatment:                                                DATE: 01/12/24  Neuromuscular re-ed: Seated shoulder flexion to 90 degrees; mirror for feedback; 2 x 10  Seated shoulder abduction to 70 degrees; mirror for feedback; 2 x 10 (long lever)  Reactive isometrics ER/IR red band x 10 each  Shoulder taps at wall 2 x 10  Therapeutic Activity: Pulleys flexion,scaption x 2 minutes each Inclined shoulder flexion @  60 degrees x 10; 1 lb  Shoulder extension AAROM with dowel x 10  Finger ladder flexion x 5   Modalities: Game ready (vaso) Lt shoulder, 34 deg, med compression x 10 min    [facility] Adult PT Treatment:                                                DATE: 01/08/24  Neuromuscular re-ed: Sidelying ER x 10  Sidelying shoulder abduction x 10 @ 2 lbs  Bilateral shoulder ER x 10  Therapeutic Activity: Pulleys 2 min each flexion/scaption  Inclined shoulder flexion @ 50 degrees x 10 @ 2 lbs  Seated shoulder flexion AROM x 10  Seated shoulder abduction AROM x 10 elbow bent  Functional IR reach x 5  Modalities: Game ready (vaso) Lt shoulder, 34 deg, med compression x 10 min    [facility] Adult PT Treatment:                                                DATE: 01/05/24  Neuromuscular re-ed: Seated shoulder abduction AROM with  elbow bent and mirror for visual feedback to prevent shoulder shrug x 10  Seated shoulder flexion AROM with mirror for visual feedback to prevent shoulder shrug x 10  Resisted shoulder extension red band 2 x 10  Therapeutic Activity: Pulleys flexion and scaption 2 min each  Inclined shoulder flexion @ 50 degrees x 10; 2 x 10 @ 2 lbs  Shoulder extension AAROM with dowel x 10  Shoulder functional IR with towel x 10  Modalities: Game ready (vaso) Lt shoulder, 34 deg, med compression x 10 min   [facility] Adult PT Treatment:                                                DATE: 01/01/24 Therapeutic Exercise: UBE L1 x 2 min fwd, x2 min bwd Wall slides flexion, scaption and ER x10 each Rechecked AROM Therapeutic Activity: Supine shoulder flexion 2# 2x10 Sidelying shoulder ER 2# 2x10 Sidelying shoulder scaption no weight x10, 2# x10 Sidelying horizontal shoulder abd red TB 2x6 Sidelying bow and arrow red TB 2x10 Modalities: Game ready (vaso) Lt shoulder, 34 deg, med compression x 10 min Self Care: Reviewed Scar massage      PATIENT EDUCATION: Education details: HEP review  Person educated: Patient  Education method: Explanation, Education comprehension: verbalized understanding,  HOME EXERCISE PROGRAM: Access Code: [email or web address] URL: [email or web address] Date: 01/08/2024 Prepared by: Letitia Libra  Exercises - Seated Shoulder Scaption AAROM with Pulley at Side  - 1 x daily - 7 x weekly - 2 sets - 10 reps - Shoulder Flexion Wall Slide with Towel  - 1 x daily - 7 x weekly - 1 sets - 10 reps - 5 sec  hold - Standing Shoulder Abduction Slides at Wall  - 1 x daily - 7 x weekly - 1 sets - 10 reps - 5 sec  hold - Sidelying Shoulder Abduction  - 1 x daily - 7 x weekly - 2 sets - 10 reps - Sidelying Shoulder External Rotation  - 1  x daily - 7 x weekly - 3 sets - 10 reps - Prone Shoulder Extension - Single Arm  - 1 x daily - 7 x weekly - 2 sets - 10 reps - Standing Shoulder Row  with Anchored Resistance  - 1 x daily - 7 x weekly - 3 sets - 10 reps - Seated Single Arm Bicep Curls with Rotation and Dumbbell  - 1 x daily - 7 x weekly - 2 sets - 10 reps - Standing Shoulder Flexion to 90 Degrees  - 1 x daily - 7 x weekly - 3 sets - 5 reps - Shoulder External Rotation and Scapular Retraction  - 1 x daily - 7 x weekly - 2 sets - 10 reps  ASSESSMENT:  CLINICAL IMPRESSION: Progressed shoulder AROM in gravity dependent positioning with patient able to complete seated shoulder flexion to 90 degrees and abduction to 70 degrees before shoulder shrug is present. Introduced UE CKC strengthening today with patient demonstrating good stability without onset of pain.   OBJECTIVE IMPAIRMENTS: decreased activity tolerance, decreased ROM, decreased strength, increased edema, increased fascial restrictions, increased muscle spasms, impaired flexibility, impaired tone, postural dysfunction, and pain.     GOALS: Goals reviewed with patient? Yes  SHORT TERM GOALS: Target date: 11/13/23  The patient will be indep with HEP. Baseline: initiated at eval Goal status: MET  2.  The patient will have PROM documented. Baseline:  began <1 week with elbow and wrist ROM + grip and neck ROM Goal status: MET   LONG TERM GOALS: Target date: 01/31/24  The patient will be indep with HEP. Baseline:  initiated at eval Goal status: progressing   2.  The patient will improve ROM to protocol ranges of flexion to 110-120, ER 30-45. Baseline: not assessed at eval 12/04/23: met PROM, progressing AROM per protocol  Goal status: MET   3.  The patient will tolerate isometrics below shoulder level. Baseline:  not assessed at eval Goal status: MET   4.  The patient will reach into shoulder flexion/scaption to 90 degrees to demo improved strength against gravity. Baseline: not assessed at eval. 11/13/23: deferred due to post-op restrictions  12/04/23: flexion to 138 in supine  Goal status: ongoing    5.   The patient will reduce pain to < or equal to 2/10. Baseline:  5-9/10 12/04/23: 6 at worst  Goal status: progressing   6.  Pt will improve FOTO to >= 74 Baseline: see above  Goal status: progressing   7. Patient will demonstrate at least 4/5 gross Lt shoulder strength to improve ability to lift and carry items.   Baseline: see MMT chart   Goal status: NEW  PLAN:  PT FREQUENCY: 1-2x/week  PT DURATION: 8 weeks  PLANNED INTERVENTIONS: 97164- PT Re-evaluation, 97110-Therapeutic exercises, 97530- Therapeutic activity, 97112- Neuromuscular re-education, 97535- Self Care, 53664- Manual therapy, 97014- Electrical stimulation (unattended), 97016- Vasopneumatic device, Patient/Family education, Taping, Dry Needling, Joint mobilization, Cryotherapy, and Moist heat  PLAN FOR NEXT SESSION: progress per protocol;   Letitia Libra, PT, DPT, ATC 01/12/24 9:30 AM

## 2024-01-13 DIAGNOSIS — H43813 Vitreous degeneration, bilateral: Secondary | ICD-10-CM | POA: Diagnosis not present

## 2024-01-13 DIAGNOSIS — H526 Other disorders of refraction: Secondary | ICD-10-CM | POA: Diagnosis not present

## 2024-01-13 DIAGNOSIS — H43822 Vitreomacular adhesion, left eye: Secondary | ICD-10-CM | POA: Diagnosis not present

## 2024-01-13 DIAGNOSIS — H25813 Combined forms of age-related cataract, bilateral: Secondary | ICD-10-CM | POA: Diagnosis not present

## 2024-01-13 DIAGNOSIS — H35372 Puckering of macula, left eye: Secondary | ICD-10-CM | POA: Diagnosis not present

## 2024-01-15 ENCOUNTER — Ambulatory Visit: Payer: Medicare HMO

## 2024-01-15 DIAGNOSIS — R600 Localized edema: Secondary | ICD-10-CM

## 2024-01-15 DIAGNOSIS — M25512 Pain in left shoulder: Secondary | ICD-10-CM

## 2024-01-15 DIAGNOSIS — M6281 Muscle weakness (generalized): Secondary | ICD-10-CM

## 2024-01-15 DIAGNOSIS — R293 Abnormal posture: Secondary | ICD-10-CM

## 2024-01-15 NOTE — Therapy (Signed)
 OUTPATIENT PHYSICAL THERAPY SHOULDER TREATMENT   Patient Name: Joshua [name] [facility] III MRN: [id number] DOB:1946/05/07, 78 y.o., male Today's Date: 01/15/2024  END OF SESSION:  PT End of Session - 01/15/24 0847     Visit Number 24    Number of Visits 32    Date for PT Re-Evaluation 01/31/24    Authorization Type [employer] medicare    Authorization Time Period 2/3-3/26/25    Authorization - Visit Number 8    Authorization - Number of Visits 16    PT Start Time 0847    PT Stop Time 0935    PT Time Calculation (min) 48 min    Activity Tolerance Patient tolerated treatment well    Behavior During Therapy [facility] for tasks assessed/performed                 Past Medical History:  Diagnosis Date   Abnormal findings on diagnostic imaging of lung 09/23/2019   09/22/2019-lung cancer screening-centrilobular and paraseptal emphysema, calcified granulomata noted bilaterally, no suspicious nodules, lung RADS 1, repeat in 12 months    Anxiety    Atrial fibrillation (HCC) 09/23/2019   Centrilobular emphysema (HCC) 09/23/2019   09/22/2019-lung cancer screening-centrilobular and paraseptal emphysema, calcified granulomata noted bilaterally, no suspicious nodules, lung RADS 1, repeat in 12 months  09/23/2019-FVC 3.74 (81% predicted), postbronchodilator ratio 80, postbronchodilator FEV1 2.88 (86% predicted), no bronchodilator response, DLCO 19.44 (73% predicted)   DDD (degenerative disc disease), cervical 05/13/2008   Cervical Disc Degeneration   Depression    ETOH abuse 10/01/2023   continues to drink 1-3 drinks/ day   History of adenomatous polyp of colon 03/03/2018   05/14/17: Colonoscopy: nonadvanced adenoma, f/u 5 yrs, use SuPrep, Murphy/GAP  Last Assessment & Plan:  Relevant Hx: Course: Daily Update: Today's Plan:   History of kidney stones    Hyperlipidemia    Kidney stone    Nephrolithiasis 05/22/2011   Nontraumatic incomplete tear of right rotator cuff 10/14/2018   Other and  unspecified hyperlipidemia 11/20/2009   Hyperlipidemia   RBBB 06/09/2012   Secondary hypercoagulable state (HCC) 09/28/2019   Shortness of breath 09/23/2019   Past Surgical History:  Procedure Laterality Date   CARDIOVERSION N/A 10/15/2019   Procedure: CARDIOVERSION;  Surgeon: Jake Bathe, MD;  Location: [facility] ENDOSCOPY;  Service: Cardiovascular;  Laterality: N/A;   CARDIOVERSION N/A 11/29/2019   Procedure: CARDIOVERSION;  Surgeon: Lars Masson, MD;  Location: [facility] ENDOSCOPY;  Service: Cardiovascular;  Laterality: N/A;   HEMORRHOID SURGERY     REVERSE SHOULDER ARTHROPLASTY Left 10/09/2023   Procedure: REVERSE SHOULDER ARTHROPLASTY;  Surgeon: Bjorn Pippin, MD;  Location: [facility];  Service: Orthopedics;  Laterality: Left;   TOTAL HIP ARTHROPLASTY Right 12/21/2021   Procedure: TOTAL HIP ARTHROPLASTY ANTERIOR APPROACH;  Surgeon: Jodi Geralds, MD;  Location: [facility] ORS;  Service: Orthopedics;  Laterality: Right;   VARICOSE VEIN SURGERY Right    Patient Active Problem List   Diagnosis Date Noted   Alcohol-induced mood disorder with depressive symptoms (HCC) 06/10/2023   Benign prostatic hyperplasia without lower urinary tract symptoms 06/05/2023   Dyslipidemia 06/05/2023   Fall 06/05/2023   History of peripheral neuropathy 06/05/2023   Subdural hematoma (HCC) 06/04/2023   Pain in finger of left hand 03/12/2023   Alcohol abuse 05/31/2022   Disorder of thoracic spine 05/31/2022   COVID-19 virus infection 03/04/2022   Acute bronchitis 03/04/2022   OSA (obstructive sleep apnea) 03/04/2022   PAF (paroxysmal atrial fibrillation) (HCC) 12/13/2021   Anxiety  11/08/2021   Aphasia 11/08/2021   Bipolar 1 disorder (HCC) 11/08/2021   CKD (chronic kidney disease), stage III (HCC) 11/08/2021   Expressive aphasia 11/08/2021   Prolonged QT interval 11/08/2021   Recurrent falls 11/08/2021   Primary osteoarthritis of right hip 10/26/2021   Abnormal liver function tests 09/10/2021    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   Urinary hesitancy 03/08/2021   Thoracic aortic aneurysm without rupture (HCC) 03/08/2021   Senile purpura (HCC) 09/07/2020   Aortic atherosclerosis (HCC) 09/07/2020   Sinus bradycardia 03/31/2020   Essential hypertension 03/31/2020   Major depressive disorder 03/07/2020   Essential tremor 03/07/2020   Tremor 01/21/2020   Obesity (BMI 30-39.9) 12/10/2019   Chest pain of uncertain etiology 12/10/2019   Secondary hypercoagulable state (HCC) 09/28/2019   Centrilobular emphysema (HCC) 09/23/2019   Former smoker 09/23/2019   Abnormal findings on diagnostic imaging of lung 09/23/2019   Atrial fibrillation (HCC) 09/23/2019   At risk for sleep apnea 09/23/2019   Shortness of breath 09/23/2019   Nontraumatic incomplete tear of right rotator cuff 10/14/2018   History of adenomatous polyp of colon 03/03/2018   RBBB 06/09/2012   Nephrolithiasis 05/22/2011   Mixed hyperlipidemia 11/20/2009   DDD (degenerative disc disease), cervical 05/13/2008    PCP: Everrett Coombe, DO REFERRING PROVIDER: Ramond Marrow, MD REFERRING DIAG: [phone] (ICD-10-CM) - Status post reverse total arthroplasty of left shoulder  THERAPY DIAG:  Acute pain of left shoulder  Muscle weakness (generalized)  Localized edema  Abnormal posture  Rationale for Evaluation and Treatment: Rehabilitation  ONSET DATE: 10/09/23 DATE OF SURGERY  SUBJECTIVE:                                                                                                                                                                                     SUBJECTIVE STATEMENT: "It's getting better."   Hand dominance: Right  PERTINENT HISTORY: H/o a-fib, loop recorder, ETOH use, depression  PAIN:  Are you having pain? Yes: NPRS scale: none currently; 4 at worst/10 Pain location: Lt lateral shoulder Pain description: sore Aggravating factors: overhead reaching Relieving factors: medicine  PRECAUTIONS: Shoulder and Other:  reverse total shoulder replacement  to follow protocol for reverse total shoulder Phase IV 12/18/23 to 01/01/24: Standing AROM, 1-3 lbs weights Phase V: No ROM restrictions  WEIGHT BEARING RESTRICTIONS: Yes < 5 lb per protocol until 01/01/24 and then advance to tolerance    FALLS:  Has patient fallen in last 6 months? Yes. Number of falls 2x in the past 6 months. He has a h/o drinking and felt like gabapentin + alcohol led to falls.    LIVING ENVIRONMENT: Lives with: lives with their spouse Lives  in: House/apartment Stairs:  2 to garage and 2 to deck in back  OCCUPATION: Retired from [employer]; enjoys repairing things  PATIENT GOALS:reduced pain and improve use of the arm "as close to normal as I can get"  NEXT MD VISIT:  01/04/24  OBJECTIVE:  Note: Objective measures were completed at Evaluation unless otherwise noted.  PATIENT SURVEYS:  FOTO 44 11/04/23: 44%  11/13/23: 58% function  12/04/23: 59% function  01/05/24: 66% function   EDEMA: Pocket of edema in distal medial arm, bruising in proximal biceps bruising and edema noted dressing in place  POSTURE: Rounded shoulders and forward head position  UPPER EXTREMITY ROM:    ROM Left 12/13 11/06/23 Left  11/13/23 Left  11/25/23 Left  11/27/23 Left  12/04/23 Left  12/11/23 Left  01/01/24 01/08/24 Left   Shoulder flexion 75 140 PROM  135 PROM 136 AROM  138 AROM 140 AROM 140 AROM sup 145 AROM supine   Shoulder extension           Shoulder abduction  110 PROM 112 PROM   95 AROM 97 AROM 105 AROM sidelying 105 AROM supine  Shoulder adduction           Shoulder internal rotation           Shoulder external rotation 0 32 PROM  42 PROM 45 PROM 56 PROM 30 AROM 36 AROM 40 AROM sup 45 AROM supine   Elbow flexion           Elbow extension           Wrist flexion           Wrist extension           Wrist ulnar deviation           Wrist radial deviation           Wrist pronation           Wrist supination           (Blank rows = not  tested)  UPPER EXTREMITY MMT:  wrist flexion/extension grip strength 35 L and 60 R UNABLE-- will assess once protocol allows MMT Right eval Left 12/04/23  Shoulder flexion  3-  Shoulder extension    Shoulder abduction  3-  Shoulder adduction    Shoulder internal rotation  Deferred   Shoulder external rotation  Deferred   Middle trapezius    Lower trapezius    Elbow flexion    Elbow extension    Wrist flexion    Wrist extension    Wrist ulnar deviation    Wrist radial deviation    Wrist pronation    Wrist supination    Grip strength (lbs)    (Blank rows = not tested) [facility] Adult PT Treatment:                                                DATE: 01/15/24 Therapeutic Exercise: Bilateral shoulder ER yellow 2 x 10  Supine horizontal shoulder abduction 2 x 10; yellow band  Updated HEP   Therapeutic Activity: Pulleys x 2 minutes flexion  Inclined shoulder flexion @ 60 degrees, x 10 @ 1 lb Inclined shoulder flexion @ 60 degrees AAROM with physioball x 10  Standing shoulder wall ball walk ups with physioball x 10  Seated shoulder abduction AROM long lever x 10  Modalities: Game ready (vaso) Lt shoulder, 34 deg, med compression x 10 min   [facility] Adult PT Treatment:                                                DATE: 01/12/24  Neuromuscular re-ed: Seated shoulder flexion to 90 degrees; mirror for feedback; 2 x 10  Seated shoulder abduction to 70 degrees; mirror for feedback; 2 x 10 (long lever)  Reactive isometrics ER/IR red band x 10 each  Shoulder taps at wall 2 x 10  Therapeutic Activity: Pulleys flexion,scaption x 2 minutes each Inclined shoulder flexion @ 60 degrees x 10; 1 lb  Shoulder extension AAROM with dowel x 10  Finger ladder flexion x 5   Modalities: Game ready (vaso) Lt shoulder, 34 deg, med compression x 10 min    [facility] Adult PT Treatment:                                                DATE: 01/08/24  Neuromuscular re-ed: Sidelying ER x 10  Sidelying  shoulder abduction x 10 @ 2 lbs  Bilateral shoulder ER x 10  Therapeutic Activity: Pulleys 2 min each flexion/scaption  Inclined shoulder flexion @ 50 degrees x 10 @ 2 lbs  Seated shoulder flexion AROM x 10  Seated shoulder abduction AROM x 10 elbow bent  Functional IR reach x 5  Modalities: Game ready (vaso) Lt shoulder, 34 deg, med compression x 10 min    [facility] Adult PT Treatment:                                                DATE: 01/05/24  Neuromuscular re-ed: Seated shoulder abduction AROM with elbow bent and mirror for visual feedback to prevent shoulder shrug x 10  Seated shoulder flexion AROM with mirror for visual feedback to prevent shoulder shrug x 10  Resisted shoulder extension red band 2 x 10  Therapeutic Activity: Pulleys flexion and scaption 2 min each  Inclined shoulder flexion @ 50 degrees x 10; 2 x 10 @ 2 lbs  Shoulder extension AAROM with dowel x 10  Shoulder functional IR with towel x 10  Modalities: Game ready (vaso) Lt shoulder, 34 deg, med compression x 10 min     PATIENT EDUCATION: Education details: HEP update  Person educated: Patient  Education method: Explanation,demo, cues, handout Education comprehension: verbalized understanding, returned demo, cues   HOME EXERCISE PROGRAM: Access Code: [email or web address] URL: [email or web address] Date: 01/15/2024 Prepared by: Letitia Libra  Exercises - Seated Shoulder Scaption AAROM with Pulley at Side  - 1 x daily - 7 x weekly - 2 sets - 10 reps - Shoulder Flexion Wall Slide with Towel  - 1 x daily - 7 x weekly - 1 sets - 10 reps - 5 sec  hold - Standing Shoulder Abduction Slides at Wall  - 1 x daily - 7 x weekly - 1 sets - 10 reps - 5 sec  hold - Standing Shoulder Flexion to 90 Degrees  - 1 x daily - 7 x weekly - 3 sets - 5 reps -  Sidelying Shoulder Abduction  - 1 x daily - 7 x weekly - 2 sets - 10 reps - Sidelying Shoulder External Rotation  - 1 x daily - 7 x weekly - 2 sets - 10 reps - Prone  Shoulder Extension - Single Arm  - 1 x daily - 7 x weekly - 2 sets - 10 reps - Standing Shoulder Row with Anchored Resistance  - 1 x daily - 3 x weekly - 2 sets - 10 reps - Seated Single Arm Bicep Curls with Rotation and Dumbbell  - 1 x daily - 3 x weekly - 2 sets - 10 reps - Shoulder External Rotation and Scapular Retraction with Resistance  - 1 x daily - 3 x weekly - 2 sets - 10 reps - Supine Shoulder Horizontal Abduction with Resistance  - 1 x daily - 3 x weekly - 2 sets - 10 reps  ASSESSMENT:  CLINICAL IMPRESSION: Continued with progression of shoulder strengthening and ROM with good tolerance. Challenged with inclined shoulder flexion strengthening with visible shaking in musculature, though is able to maintain proper form/activation through partial range. Progressed rotator cuff and periscapular resisted strengthening without onset of pain.   OBJECTIVE IMPAIRMENTS: decreased activity tolerance, decreased ROM, decreased strength, increased edema, increased fascial restrictions, increased muscle spasms, impaired flexibility, impaired tone, postural dysfunction, and pain.     GOALS: Goals reviewed with patient? Yes  SHORT TERM GOALS: Target date: 11/13/23  The patient will be indep with HEP. Baseline: initiated at eval Goal status: MET  2.  The patient will have PROM documented. Baseline:  began <1 week with elbow and wrist ROM + grip and neck ROM Goal status: MET   LONG TERM GOALS: Target date: 01/31/24  The patient will be indep with HEP. Baseline:  initiated at eval Goal status: progressing   2.  The patient will improve ROM to protocol ranges of flexion to 110-120, ER 30-45. Baseline: not assessed at eval 12/04/23: met PROM, progressing AROM per protocol  Goal status: MET   3.  The patient will tolerate isometrics below shoulder level. Baseline:  not assessed at eval Goal status: MET   4.  The patient will reach into shoulder flexion/scaption to 90 degrees to demo  improved strength against gravity. Baseline: not assessed at eval. 11/13/23: deferred due to post-op restrictions  12/04/23: flexion to 138 in supine  Goal status: ongoing    5.  The patient will reduce pain to < or equal to 2/10. Baseline:  5-9/10 12/04/23: 6 at worst  Goal status: progressing   6.  Pt will improve FOTO to >= 74 Baseline: see above  Goal status: progressing   7. Patient will demonstrate at least 4/5 gross Lt shoulder strength to improve ability to lift and carry items.   Baseline: see MMT chart   Goal status: NEW  PLAN:  PT FREQUENCY: 1-2x/week  PT DURATION: 8 weeks  PLANNED INTERVENTIONS: 97164- PT Re-evaluation, 97110-Therapeutic exercises, 97530- Therapeutic activity, 97112- Neuromuscular re-education, 97535- Self Care, [id number]- Manual therapy, 97014- Electrical stimulation (unattended), 97016- Vasopneumatic device, Patient/Family education, Taping, Dry Needling, Joint mobilization, Cryotherapy, and Moist heat  PLAN FOR NEXT SESSION: progress per protocol;   Letitia Libra, PT, DPT, ATC 01/15/24 9:27 AM

## 2024-01-19 ENCOUNTER — Ambulatory Visit: Payer: Medicare HMO

## 2024-01-19 DIAGNOSIS — R293 Abnormal posture: Secondary | ICD-10-CM | POA: Diagnosis not present

## 2024-01-19 DIAGNOSIS — M6281 Muscle weakness (generalized): Secondary | ICD-10-CM

## 2024-01-19 DIAGNOSIS — M25512 Pain in left shoulder: Secondary | ICD-10-CM | POA: Diagnosis not present

## 2024-01-19 DIAGNOSIS — R600 Localized edema: Secondary | ICD-10-CM | POA: Diagnosis not present

## 2024-01-19 NOTE — Therapy (Signed)
 OUTPATIENT PHYSICAL THERAPY SHOULDER TREATMENT   Patient Name: Joshua [name] MRN: [id number] DOB:Mar 14, 1946, 78 y.o., male Today's Date: 01/19/2024  END OF SESSION:  PT End of Session - 01/19/24 0852     Visit Number 25    Number of Visits 32    Date for PT Re-Evaluation 01/31/24    Authorization Type [employer] medicare    Authorization Time Period 2/3-3/26/25    Authorization - Visit Number 9    Authorization - Number of Visits 16    PT Start Time 0852    PT Stop Time 0944    PT Time Calculation (min) 52 min    Activity Tolerance Patient tolerated treatment well    Behavior During Therapy [facility] for tasks assessed/performed                  Past Medical History:  Diagnosis Date   Abnormal findings on diagnostic imaging of lung 09/23/2019   09/22/2019-lung cancer screening-centrilobular and paraseptal emphysema, calcified granulomata noted bilaterally, no suspicious nodules, lung RADS 1, repeat in 12 months    Anxiety    Atrial fibrillation (HCC) 09/23/2019   Centrilobular emphysema (HCC) 09/23/2019   09/22/2019-lung cancer screening-centrilobular and paraseptal emphysema, calcified granulomata noted bilaterally, no suspicious nodules, lung RADS 1, repeat in 12 months  09/23/2019-FVC 3.74 (81% predicted), postbronchodilator ratio 80, postbronchodilator FEV1 2.88 (86% predicted), no bronchodilator response, DLCO 19.44 (73% predicted)   DDD (degenerative disc disease), cervical 05/13/2008   Cervical Disc Degeneration   Depression    ETOH abuse 10/01/2023   continues to drink 1-3 drinks/ day   History of adenomatous polyp of colon 03/03/2018   05/14/17: Colonoscopy: nonadvanced adenoma, f/u 5 yrs, use SuPrep, Murphy/GAP  Last Assessment & Plan:  Relevant Hx: Course: Daily Update: Today's Plan:   History of kidney stones    Hyperlipidemia    Kidney stone    Nephrolithiasis 05/22/2011   Nontraumatic incomplete tear of right rotator cuff 10/14/2018   Other and  unspecified hyperlipidemia 11/20/2009   Hyperlipidemia   RBBB 06/09/2012   Secondary hypercoagulable state (HCC) 09/28/2019   Shortness of breath 09/23/2019   Past Surgical History:  Procedure Laterality Date   CARDIOVERSION N/A 10/15/2019   Procedure: CARDIOVERSION;  Surgeon: Jake Bathe, MD;  Location: [facility] ENDOSCOPY;  Service: Cardiovascular;  Laterality: N/A;   CARDIOVERSION N/A 11/29/2019   Procedure: CARDIOVERSION;  Surgeon: Lars Masson, MD;  Location: [facility] ENDOSCOPY;  Service: Cardiovascular;  Laterality: N/A;   HEMORRHOID SURGERY     REVERSE SHOULDER ARTHROPLASTY Left 10/09/2023   Procedure: REVERSE SHOULDER ARTHROPLASTY;  Surgeon: Bjorn Pippin, MD;  Location: [facility];  Service: Orthopedics;  Laterality: Left;   TOTAL HIP ARTHROPLASTY Right 12/21/2021   Procedure: TOTAL HIP ARTHROPLASTY ANTERIOR APPROACH;  Surgeon: Jodi Geralds, MD;  Location: [facility] ORS;  Service: Orthopedics;  Laterality: Right;   VARICOSE VEIN SURGERY Right    Patient Active Problem List   Diagnosis Date Noted   Alcohol-induced mood disorder with depressive symptoms (HCC) 06/10/2023   Benign prostatic hyperplasia without lower urinary tract symptoms 06/05/2023   Dyslipidemia 06/05/2023   Fall 06/05/2023   History of peripheral neuropathy 06/05/2023   Subdural hematoma (HCC) 06/04/2023   Pain in finger of left hand 03/12/2023   Alcohol abuse 05/31/2022   Disorder of thoracic spine 05/31/2022   COVID-19 virus infection 03/04/2022   Acute bronchitis 03/04/2022   OSA (obstructive sleep apnea) 03/04/2022   PAF (paroxysmal atrial fibrillation) (HCC) 12/13/2021  Anxiety 11/08/2021   Aphasia 11/08/2021   Bipolar 1 disorder (HCC) 11/08/2021   CKD (chronic kidney disease), stage [name] (HCC) 11/08/2021   Expressive aphasia 11/08/2021   Prolonged QT interval 11/08/2021   Recurrent falls 11/08/2021   Primary osteoarthritis of right hip 10/26/2021   Abnormal liver function tests 09/10/2021    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   Urinary hesitancy 03/08/2021   Thoracic aortic aneurysm without rupture (HCC) 03/08/2021   Senile purpura (HCC) 09/07/2020   Aortic atherosclerosis (HCC) 09/07/2020   Sinus bradycardia 03/31/2020   Essential hypertension 03/31/2020   Major depressive disorder 03/07/2020   Essential tremor 03/07/2020   Tremor 01/21/2020   Obesity (BMI 30-39.9) 12/10/2019   Chest pain of uncertain etiology 12/10/2019   Secondary hypercoagulable state (HCC) 09/28/2019   Centrilobular emphysema (HCC) 09/23/2019   Former smoker 09/23/2019   Abnormal findings on diagnostic imaging of lung 09/23/2019   Atrial fibrillation (HCC) 09/23/2019   At risk for sleep apnea 09/23/2019   Shortness of breath 09/23/2019   Nontraumatic incomplete tear of right rotator cuff 10/14/2018   History of adenomatous polyp of colon 03/03/2018   RBBB 06/09/2012   Nephrolithiasis 05/22/2011   Mixed hyperlipidemia 11/20/2009   DDD (degenerative disc disease), cervical 05/13/2008    PCP: Everrett Coombe, DO REFERRING PROVIDER: Ramond Marrow, MD REFERRING DIAG: [phone] (ICD-10-CM) - Status post reverse total arthroplasty of left shoulder  THERAPY DIAG:  Acute pain of left shoulder  Muscle weakness (generalized)  Localized edema  Abnormal posture  Rationale for Evaluation and Treatment: Rehabilitation  ONSET DATE: 10/09/23 DATE OF SURGERY  SUBJECTIVE:                                                                                                                                                                                     SUBJECTIVE STATEMENT: "A little pain."  Hand dominance: Right  PERTINENT HISTORY: H/o a-fib, loop recorder, ETOH use, depression  PAIN:  Are you having pain? Yes: NPRS scale: 1/10 Pain location: Lt lateral shoulder Pain description: sore Aggravating factors: overhead reaching Relieving factors: medicine  PRECAUTIONS: Shoulder and Other: reverse total shoulder  replacement  to follow protocol for reverse total shoulder Phase IV 12/18/23 to 01/01/24: Standing AROM, 1-3 lbs weights Phase V: No ROM restrictions  WEIGHT BEARING RESTRICTIONS: Yes < 5 lb per protocol until 01/01/24 and then advance to tolerance    FALLS:  Has patient fallen in last 6 months? Yes. Number of falls 2x in the past 6 months. He has a h/o drinking and felt like gabapentin + alcohol led to falls.    LIVING ENVIRONMENT: Lives with: lives with their spouse Lives in: House/apartment Stairs:  2 to garage and 2 to deck in back  OCCUPATION: Retired from [employer]; enjoys repairing things  PATIENT GOALS:reduced pain and improve use of the arm "as close to normal as I can get"  NEXT MD VISIT:  01/04/24  OBJECTIVE:  Note: Objective measures were completed at Evaluation unless otherwise noted.  PATIENT SURVEYS:  FOTO 44 11/04/23: 44%  11/13/23: 58% function  12/04/23: 59% function  01/05/24: 66% function   EDEMA: Pocket of edema in distal medial arm, bruising in proximal biceps bruising and edema noted dressing in place  POSTURE: Rounded shoulders and forward head position  UPPER EXTREMITY ROM:    ROM Left 12/13 11/06/23 Left  11/13/23 Left  11/25/23 Left  11/27/23 Left  12/04/23 Left  12/11/23 Left  01/01/24 01/08/24 Left  01/19/24 Left   Shoulder flexion 75 140 PROM  135 PROM 136 AROM  138 AROM 140 AROM 140 AROM sup 145 AROM supine  122 AROM standing   Shoulder extension            Shoulder abduction  110 PROM 112 PROM   95 AROM 97 AROM 105 AROM sidelying 105 AROM supine   Shoulder adduction            Shoulder internal rotation            Shoulder external rotation 0 32 PROM  42 PROM 45 PROM 56 PROM 30 AROM 36 AROM 40 AROM sup 45 AROM supine    Elbow flexion            Elbow extension            Wrist flexion            Wrist extension            Wrist ulnar deviation            Wrist radial deviation            Wrist pronation            Wrist supination            (Blank  rows = not tested)  UPPER EXTREMITY MMT:  wrist flexion/extension grip strength 35 L and 60 R UNABLE-- will assess once protocol allows MMT Right eval Left 12/04/23  Shoulder flexion  3-  Shoulder extension    Shoulder abduction  3-  Shoulder adduction    Shoulder internal rotation  Deferred   Shoulder external rotation  Deferred   Middle trapezius    Lower trapezius    Elbow flexion    Elbow extension    Wrist flexion    Wrist extension    Wrist ulnar deviation    Wrist radial deviation    Wrist pronation    Wrist supination    Grip strength (lbs)    (Blank rows = not tested)  [facility] Adult PT Treatment:                                                DATE: 01/19/24  Neuromuscular re-ed: Sidelying ER 2 x 10 @ 1 lb  Inclined shoulder taps at counter 2 x 10  Therapeutic Activity: Pulleys flexion, scaption x 2 minutes each  Seated shoulder flexion AROM long lever 2 x 5  Seated shoulder scaption AROM long lever 2 x 5  Seated shoulder abduction AROM long lever 2  x 5 Functional IR stretch with strap x 10; 5 sec hold  Sled pushing 1 lap, no weight Sled pulling x 15 ft, no weight  Shoulder extension AAROM with dowel x 10  Modalities: Game ready (vaso) Lt shoulder, 34 deg, med compression x 10 min   [facility] Adult PT Treatment:                                                DATE: 01/15/24 Therapeutic Exercise: Bilateral shoulder ER yellow 2 x 10  Supine horizontal shoulder abduction 2 x 10; yellow band  Updated HEP   Therapeutic Activity: Pulleys x 2 minutes flexion  Inclined shoulder flexion @ 60 degrees, x 10 @ 1 lb Inclined shoulder flexion @ 60 degrees AAROM with physioball x 10  Standing shoulder wall ball walk ups with physioball x 10  Seated shoulder abduction AROM long lever x 10  Modalities: Game ready (vaso) Lt shoulder, 34 deg, med compression x 10 min   [facility] Adult PT Treatment:                                                DATE: 01/12/24  Neuromuscular  re-ed: Seated shoulder flexion to 90 degrees; mirror for feedback; 2 x 10  Seated shoulder abduction to 70 degrees; mirror for feedback; 2 x 10 (long lever)  Reactive isometrics ER/IR red band x 10 each  Shoulder taps at wall 2 x 10  Therapeutic Activity: Pulleys flexion,scaption x 2 minutes each Inclined shoulder flexion @ 60 degrees x 10; 1 lb  Shoulder extension AAROM with dowel x 10  Finger ladder flexion x 5   Modalities: Game ready (vaso) Lt shoulder, 34 deg, med compression x 10 min    [facility] Adult PT Treatment:                                                DATE: 01/08/24  Neuromuscular re-ed: Sidelying ER x 10  Sidelying shoulder abduction x 10 @ 2 lbs  Bilateral shoulder ER x 10  Therapeutic Activity: Pulleys 2 min each flexion/scaption  Inclined shoulder flexion @ 50 degrees x 10 @ 2 lbs  Seated shoulder flexion AROM x 10  Seated shoulder abduction AROM x 10 elbow bent  Functional IR reach x 5  Modalities: Game ready (vaso) Lt shoulder, 34 deg, med compression x 10 min       PATIENT EDUCATION: Education details: HEP update  Person educated: Patient  Education method: Explanation,demo, cues, handout Education comprehension: verbalized understanding, returned demo, cues   HOME EXERCISE PROGRAM: Access Code: [email or web address] URL: [email or web address] Date: 01/19/2024 Prepared by: Letitia Libra  Exercises - Seated Shoulder Scaption AAROM with Pulley at Side  - 1 x daily - 7 x weekly - 2 sets - 10 reps - Shoulder Flexion Wall Slide with Towel  - 1 x daily - 7 x weekly - 1 sets - 10 reps - 5 sec  hold - Standing Shoulder Abduction Slides at Wall  - 1 x daily - 7 x weekly - 1 sets - 10 reps - 5 sec  hold - Standing Shoulder Internal Rotation Stretch with Towel  - 1 x daily - 7 x weekly - 1 sets - 10 reps - 5 sec  hold - Standing Shoulder Flexion to 90 Degrees  - 1 x daily - 7 x weekly - 3 sets - 5 reps - Shoulder Abduction with Dumbbells - Palms Down  - 1  x daily - 7 x weekly - 3 sets - 5 reps - Sidelying Shoulder External Rotation  - 1 x daily - 7 x weekly - 2 sets - 10 reps - Shoulder Taps on Table  - 1 x daily - 7 x weekly - 2 sets - 10 reps - Standing Shoulder Row with Anchored Resistance  - 1 x daily - 3 x weekly - 2 sets - 10 reps - Seated Single Arm Bicep Curls with Rotation and Dumbbell  - 1 x daily - 3 x weekly - 2 sets - 10 reps - Shoulder External Rotation and Scapular Retraction with Resistance  - 1 x daily - 3 x weekly - 2 sets - 10 reps - Supine Shoulder Horizontal Abduction with Resistance  - 1 x daily - 3 x weekly - 2 sets - 10 reps  ASSESSMENT:  CLINICAL IMPRESSION: Progressed into gravity dependent Lt shoulder AROM with good tolerance. He is able to demonstrate functional Lt shoulder flexion AROM without compensatory shoulder shrug. Shoulder shrug is present at approximately 90 degrees of shoulder abduction AROM. No reports of pain with AROM progression, but does quickly fatigue. Progressed CKC activity, introducing sled pushing/pulling without onset of pain.   OBJECTIVE IMPAIRMENTS: decreased activity tolerance, decreased ROM, decreased strength, increased edema, increased fascial restrictions, increased muscle spasms, impaired flexibility, impaired tone, postural dysfunction, and pain.     GOALS: Goals reviewed with patient? Yes  SHORT TERM GOALS: Target date: 11/13/23  The patient will be indep with HEP. Baseline: initiated at eval Goal status: MET  2.  The patient will have PROM documented. Baseline:  began <1 week with elbow and wrist ROM + grip and neck ROM Goal status: MET   LONG TERM GOALS: Target date: 01/31/24  The patient will be indep with HEP. Baseline:  initiated at eval Goal status: progressing   2.  The patient will improve ROM to protocol ranges of flexion to 110-120, ER 30-45. Baseline: not assessed at eval 12/04/23: met PROM, progressing AROM per protocol  Goal status: MET   3.  The patient  will tolerate isometrics below shoulder level. Baseline:  not assessed at eval Goal status: MET   4.  The patient will reach into shoulder flexion/scaption to 90 degrees to demo improved strength against gravity. Baseline: not assessed at eval. 11/13/23: deferred due to post-op restrictions  12/04/23: flexion to 138 in supine  Goal status: ongoing    5.  The patient will reduce pain to < or equal to 2/10. Baseline:  5-9/10 12/04/23: 6 at worst  Goal status: progressing   6.  Pt will improve FOTO to >= 74 Baseline: see above  Goal status: progressing   7. Patient will demonstrate at least 4/5 gross Lt shoulder strength to improve ability to lift and carry items.   Baseline: see MMT chart   Goal status: NEW  PLAN:  PT FREQUENCY: 1-2x/week  PT DURATION: 8 weeks  PLANNED INTERVENTIONS: 97164- PT Re-evaluation, 97110-Therapeutic exercises, 97530- Therapeutic activity, 97112- Neuromuscular re-education, 97535- Self Care, 10272- Manual therapy, 97014- Electrical stimulation (unattended), 97016- Vasopneumatic device, Patient/Family education, Taping, Dry Needling, Joint mobilization,  Cryotherapy, and Moist heat  PLAN FOR NEXT SESSION: progress per protocol;   Letitia Libra, PT, DPT, ATC 01/19/24 9:39 AM

## 2024-01-20 DIAGNOSIS — C4441 Basal cell carcinoma of skin of scalp and neck: Secondary | ICD-10-CM | POA: Diagnosis not present

## 2024-01-23 ENCOUNTER — Ambulatory Visit

## 2024-01-23 DIAGNOSIS — R293 Abnormal posture: Secondary | ICD-10-CM

## 2024-01-23 DIAGNOSIS — M25512 Pain in left shoulder: Secondary | ICD-10-CM | POA: Diagnosis not present

## 2024-01-23 DIAGNOSIS — R600 Localized edema: Secondary | ICD-10-CM

## 2024-01-23 DIAGNOSIS — M6281 Muscle weakness (generalized): Secondary | ICD-10-CM

## 2024-01-23 NOTE — Therapy (Signed)
 OUTPATIENT PHYSICAL THERAPY SHOULDER TREATMENT   Patient Name: Joshua [name] [facility] MRN: [id number] DOB:1946-01-26, 78 y.o., male Today's Date: 01/23/2024  END OF SESSION:  PT End of Session - 01/23/24 0933     Visit Number 26    Number of Visits 32    Date for PT Re-Evaluation 01/31/24    Authorization Type [employer] medicare    Authorization Time Period 2/3-3/26/25    Authorization - Visit Number 10    Authorization - Number of Visits 16    PT Start Time 0933    PT Stop Time 1015    PT Time Calculation (min) 42 min    Activity Tolerance Patient tolerated treatment well    Behavior During Therapy [facility] for tasks assessed/performed                   Past Medical History:  Diagnosis Date   Abnormal findings on diagnostic imaging of lung 09/23/2019   09/22/2019-lung cancer screening-centrilobular and paraseptal emphysema, calcified granulomata noted bilaterally, no suspicious nodules, lung RADS 1, repeat in 12 months    Anxiety    Atrial fibrillation (HCC) 09/23/2019   Centrilobular emphysema (HCC) 09/23/2019   09/22/2019-lung cancer screening-centrilobular and paraseptal emphysema, calcified granulomata noted bilaterally, no suspicious nodules, lung RADS 1, repeat in 12 months  09/23/2019-FVC 3.74 (81% predicted), postbronchodilator ratio 80, postbronchodilator FEV1 2.88 (86% predicted), no bronchodilator response, DLCO 19.44 (73% predicted)   DDD (degenerative disc disease), cervical 05/13/2008   Cervical Disc Degeneration   Depression    ETOH abuse 10/01/2023   continues to drink 1-3 drinks/ day   History of adenomatous polyp of colon 03/03/2018   05/14/17: Colonoscopy: nonadvanced adenoma, f/u 5 yrs, use SuPrep, Murphy/GAP  Last Assessment & Plan:  Relevant Hx: Course: Daily Update: Today's Plan:   History of kidney stones    Hyperlipidemia    Kidney stone    Nephrolithiasis 05/22/2011   Nontraumatic incomplete tear of right rotator cuff 10/14/2018   Other and  unspecified hyperlipidemia 11/20/2009   Hyperlipidemia   RBBB 06/09/2012   Secondary hypercoagulable state (HCC) 09/28/2019   Shortness of breath 09/23/2019   Past Surgical History:  Procedure Laterality Date   CARDIOVERSION N/A 10/15/2019   Procedure: CARDIOVERSION;  Surgeon: Jake Bathe, MD;  Location[facility] ENDOSCOPY;  Service: Cardiovascular;  Laterality: N/A;   CARDIOVERSION N/A 11/29/2019   Procedure: CARDIOVERSION;  Surgeon: Lars Masson, MD;  Location: [facility] ENDOSCOPY;  Service: Cardiovascular;  Laterality: N/A;   HEMORRHOID SURGERY     REVERSE SHOULDER ARTHROPLASTY Left 10/09/2023   Procedure: REVERSE SHOULDER ARTHROPLASTY;  Surgeon: Bjorn Pippin, MD;  Location: [facility];  Service: Orthopedics;  Laterality: Left;   TOTAL HIP ARTHROPLASTY Right 12/21/2021   Procedure: TOTAL HIP ARTHROPLASTY ANTERIOR APPROACH;  Surgeon: Jodi Geralds, MD;  Location: [facility] ORS;  Service: Orthopedics;  Laterality: Right;   VARICOSE VEIN SURGERY Right    Patient Active Problem List   Diagnosis Date Noted   Alcohol-induced mood disorder with depressive symptoms (HCC) 06/10/2023   Benign prostatic hyperplasia without lower urinary tract symptoms 06/05/2023   Dyslipidemia 06/05/2023   Fall 06/05/2023   History of peripheral neuropathy 06/05/2023   Subdural hematoma (HCC) 06/04/2023   Pain in finger of left hand 03/12/2023   Alcohol abuse 05/31/2022   Disorder of thoracic spine 05/31/2022   COVID-19 virus infection 03/04/2022   Acute bronchitis 03/04/2022   OSA (obstructive sleep apnea) 03/04/2022   PAF (paroxysmal atrial fibrillation) (HCC) 12/13/2021  Anxiety 11/08/2021   Aphasia 11/08/2021   Bipolar 1 disorder (HCC) 11/08/2021   CKD (chronic kidney disease), stage III (HCC) 11/08/2021   Expressive aphasia 11/08/2021   Prolonged QT interval 11/08/2021   Recurrent falls 11/08/2021   Primary osteoarthritis of right hip 10/26/2021   Abnormal liver function tests 09/10/2021    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   Urinary hesitancy 03/08/2021   Thoracic aortic aneurysm without rupture (HCC) 03/08/2021   Senile purpura (HCC) 09/07/2020   Aortic atherosclerosis (HCC) 09/07/2020   Sinus bradycardia 03/31/2020   Essential hypertension 03/31/2020   Major depressive disorder 03/07/2020   Essential tremor 03/07/2020   Tremor 01/21/2020   Obesity (BMI 30-39.9) 12/10/2019   Chest pain of uncertain etiology 12/10/2019   Secondary hypercoagulable state (HCC) 09/28/2019   Centrilobular emphysema (HCC) 09/23/2019   Former smoker 09/23/2019   Abnormal findings on diagnostic imaging of lung 09/23/2019   Atrial fibrillation (HCC) 09/23/2019   At risk for sleep apnea 09/23/2019   Shortness of breath 09/23/2019   Nontraumatic incomplete tear of right rotator cuff 10/14/2018   History of adenomatous polyp of colon 03/03/2018   RBBB 06/09/2012   Nephrolithiasis 05/22/2011   Mixed hyperlipidemia 11/20/2009   DDD (degenerative disc disease), cervical 05/13/2008    PCP: Everrett Coombe, DO REFERRING PROVIDER: Ramond Marrow, MD REFERRING DIAG: [id number] (ICD-10-CM) - Status post reverse total arthroplasty of left shoulder  THERAPY DIAG:  Acute pain of left shoulder  Muscle weakness (generalized)  Localized edema  Abnormal posture  Rationale for Evaluation and Treatment: Rehabilitation  ONSET DATE: 10/09/23 DATE OF SURGERY  SUBJECTIVE:                                                                                                                                                                                     SUBJECTIVE STATEMENT: "Feels good. Still have some pain after the exercises." He just had biopsy performed on neck for basal cell carcinoma, so is sore in his neck.   Hand dominance: Right  PERTINENT HISTORY: H/o a-fib, loop recorder, ETOH use, depression  PAIN:  Are you having pain? Yes: NPRS scale: none currently; 2 at worst/10 Pain location: Lt shoulder Pain description:  sore Aggravating factors: overhead reaching Relieving factors: medicine  PRECAUTIONS: Shoulder and Other: reverse total shoulder replacement  to follow protocol for reverse total shoulder Phase IV 12/18/23 to 01/01/24: Standing AROM, 1-3 lbs weights Phase V: No ROM restrictions  WEIGHT BEARING RESTRICTIONS: Yes < 5 lb per protocol until 01/01/24 and then advance to tolerance    FALLS:  Has patient fallen in last 6 months? Yes. Number of falls 2x in the past 6 months. He has a  h/o drinking and felt like gabapentin + alcohol led to falls.    LIVING ENVIRONMENT: Lives with: lives with their spouse Lives in: House/apartment Stairs:  2 to garage and 2 to deck in back  OCCUPATION: Retired from [employer]; enjoys repairing things  PATIENT GOALS:reduced pain and improve use of the arm "as close to normal as I can get"  NEXT MD VISIT:  01/04/24  OBJECTIVE:  Note: Objective measures were completed at Evaluation unless otherwise noted.  PATIENT SURVEYS:  FOTO 44 11/04/23: 44%  11/13/23: 58% function  12/04/23: 59% function  01/05/24: 66% function   EDEMA: Pocket of edema in distal medial arm, bruising in proximal biceps bruising and edema noted dressing in place  POSTURE: Rounded shoulders and forward head position  UPPER EXTREMITY ROM:    ROM Left 12/13 11/06/23 Left  11/13/23 Left  11/25/23 Left  11/27/23 Left  12/04/23 Left  12/11/23 Left  01/01/24 01/08/24 Left  01/19/24 Left   Shoulder flexion 75 140 PROM  135 PROM 136 AROM  138 AROM 140 AROM 140 AROM sup 145 AROM supine  122 AROM standing   Shoulder extension            Shoulder abduction  110 PROM 112 PROM   95 AROM 97 AROM 105 AROM sidelying 105 AROM supine   Shoulder adduction            Shoulder internal rotation            Shoulder external rotation 0 32 PROM  42 PROM 45 PROM 56 PROM 30 AROM 36 AROM 40 AROM sup 45 AROM supine    Elbow flexion            Elbow extension            Wrist flexion            Wrist extension             Wrist ulnar deviation            Wrist radial deviation            Wrist pronation            Wrist supination            (Blank rows = not tested)  UPPER EXTREMITY MMT:  wrist flexion/extension grip strength 35 L and 60 R UNABLE-- will assess once protocol allows MMT Right eval Left 12/04/23  Shoulder flexion  3-  Shoulder extension    Shoulder abduction  3-  Shoulder adduction    Shoulder internal rotation  Deferred   Shoulder external rotation  Deferred   Middle trapezius    Lower trapezius    Elbow flexion    Elbow extension    Wrist flexion    Wrist extension    Wrist ulnar deviation    Wrist radial deviation    Wrist pronation    Wrist supination    Grip strength (lbs)    (Blank rows = not tested) [facility] Adult PT Treatment:                                                DATE: 01/23/24 Therapeutic Exercise: Resisted shoulder IR/ER red band 2 x 10   Neuromuscular re-ed: Resisted rows blue band 2 x 10  Resisted horizontal shoulder abduction yellow band 2 x 10  Therapeutic Activity: Wall walkups orange physioball x 10  Seated shoulder flexion AROM 1# 3 x 5  Seated shoulder abduction AROM long lever 2 x 10    [facility] Adult PT Treatment:                                                DATE: 01/19/24  Neuromuscular re-ed: Sidelying ER 2 x 10 @ 1 lb  Inclined shoulder taps at counter 2 x 10  Therapeutic Activity: Pulleys flexion, scaption x 2 minutes each  Seated shoulder flexion AROM long lever 2 x 5  Seated shoulder scaption AROM long lever 2 x 5  Seated shoulder abduction AROM long lever 2 x 5 Functional IR stretch with strap x 10; 5 sec hold  Sled pushing 1 lap, no weight Sled pulling x 15 ft, no weight  Shoulder extension AAROM with dowel x 10  Modalities: Game ready (vaso) Lt shoulder, 34 deg, med compression x 10 min   [facility] Adult PT Treatment:                                                DATE: 01/15/24 Therapeutic Exercise: Bilateral shoulder ER yellow 2 x  10  Supine horizontal shoulder abduction 2 x 10; yellow band  Updated HEP   Therapeutic Activity: Pulleys x 2 minutes flexion  Inclined shoulder flexion @ 60 degrees, x 10 @ 1 lb Inclined shoulder flexion @ 60 degrees AAROM with physioball x 10  Standing shoulder wall ball walk ups with physioball x 10  Seated shoulder abduction AROM long lever x 10  Modalities: Game ready (vaso) Lt shoulder, 34 deg, med compression x 10 min        PATIENT EDUCATION: Education details: HEP review  Person educated: Patient  Education method: Programmer, multimedia, Education comprehension: verbalized understanding,   HOME EXERCISE PROGRAM: Access Code: [email or web address] URL: [email or web address] Date: 01/19/2024 Prepared by: Letitia Libra  Exercises - Seated Shoulder Scaption AAROM with Pulley at Side  - 1 x daily - 7 x weekly - 2 sets - 10 reps - Shoulder Flexion Wall Slide with Towel  - 1 x daily - 7 x weekly - 1 sets - 10 reps - 5 sec  hold - Standing Shoulder Abduction Slides at Wall  - 1 x daily - 7 x weekly - 1 sets - 10 reps - 5 sec  hold - Standing Shoulder Internal Rotation Stretch with Towel  - 1 x daily - 7 x weekly - 1 sets - 10 reps - 5 sec  hold - Standing Shoulder Flexion to 90 Degrees  - 1 x daily - 7 x weekly - 3 sets - 5 reps - Shoulder Abduction with Dumbbells - Palms Down  - 1 x daily - 7 x weekly - 3 sets - 5 reps - Sidelying Shoulder External Rotation  - 1 x daily - 7 x weekly - 2 sets - 10 reps - Shoulder Taps on Table  - 1 x daily - 7 x weekly - 2 sets - 10 reps - Standing Shoulder Row with Anchored Resistance  - 1 x daily - 3 x weekly - 2 sets - 10 reps - Seated Single Arm Bicep Curls with Rotation and Dumbbell  -  1 x daily - 3 x weekly - 2 sets - 10 reps - Shoulder External Rotation and Scapular Retraction with Resistance  - 1 x daily - 3 x weekly - 2 sets - 10 reps - Supine Shoulder Horizontal Abduction with Resistance  - 1 x daily - 3 x weekly - 2 sets - 10  reps  ASSESSMENT:  CLINICAL IMPRESSION: Introduced light resisted strengthening through partial shoulder ROM in gravity dependent positioning with good tolerance. Fatigues quickly with these exercises with shoulder shrug emerging after about 5 reps through partial range of flexion. Progressed rotator cuff strengthening with patient demonstrating good control and form without onset of pain. Minimal cues required to reduce upper trap engagement with periscapular strengthening.   OBJECTIVE IMPAIRMENTS: decreased activity tolerance, decreased ROM, decreased strength, increased edema, increased fascial restrictions, increased muscle spasms, impaired flexibility, impaired tone, postural dysfunction, and pain.     GOALS: Goals reviewed with patient? Yes  SHORT TERM GOALS: Target date: 11/13/23  The patient will be indep with HEP. Baseline: initiated at eval Goal status: MET  2.  The patient will have PROM documented. Baseline:  began <1 week with elbow and wrist ROM + grip and neck ROM Goal status: MET   LONG TERM GOALS: Target date: 01/31/24  The patient will be indep with HEP. Baseline:  initiated at eval Goal status: progressing   2.  The patient will improve ROM to protocol ranges of flexion to 110-120, ER 30-45. Baseline: not assessed at eval 12/04/23: met PROM, progressing AROM per protocol  Goal status: MET   3.  The patient will tolerate isometrics below shoulder level. Baseline:  not assessed at eval Goal status: MET   4.  The patient will reach into shoulder flexion/scaption to 90 degrees to demo improved strength against gravity. Baseline: not assessed at eval. 11/13/23: deferred due to post-op restrictions  12/04/23: flexion to 138 in supine  Goal status: ongoing    5.  The patient will reduce pain to < or equal to 2/10. Baseline:  5-9/10 12/04/23: 6 at worst  Goal status: progressing   6.  Pt will improve FOTO to >= 74 Baseline: see above  Goal status: progressing    7. Patient will demonstrate at least 4/5 gross Lt shoulder strength to improve ability to lift and carry items.   Baseline: see MMT chart   Goal status: NEW  PLAN:  PT FREQUENCY: 1-2x/week  PT DURATION: 8 weeks  PLANNED INTERVENTIONS: 97164- PT Re-evaluation, 97110-Therapeutic exercises, 97530- Therapeutic activity, 97112- Neuromuscular re-education, 97535- Self Care, 78295- Manual therapy, 97014- Electrical stimulation (unattended), 97016- Vasopneumatic device, Patient/Family education, Taping, Dry Needling, Joint mobilization, Cryotherapy, and Moist heat  PLAN FOR NEXT SESSION: progress per protocol;   Letitia Libra, PT, DPT, ATC 01/23/24 10:17 AM

## 2024-01-26 ENCOUNTER — Ambulatory Visit: Payer: Medicare HMO

## 2024-01-26 DIAGNOSIS — M25512 Pain in left shoulder: Secondary | ICD-10-CM

## 2024-01-26 DIAGNOSIS — R293 Abnormal posture: Secondary | ICD-10-CM

## 2024-01-26 DIAGNOSIS — R600 Localized edema: Secondary | ICD-10-CM | POA: Diagnosis not present

## 2024-01-26 DIAGNOSIS — M6281 Muscle weakness (generalized): Secondary | ICD-10-CM | POA: Diagnosis not present

## 2024-01-26 NOTE — Therapy (Signed)
 OUTPATIENT PHYSICAL THERAPY SHOULDER TREATMENT   Patient Name: Joshua [name] MRN: [id number] DOB:1946-09-20, 78 y.o., male Today's Date: 01/26/2024  END OF SESSION:  PT End of Session - 01/26/24 0849     Visit Number 27    Number of Visits 32    Date for PT Re-Evaluation 01/31/24    Authorization Type [employer] medicare    Authorization Time Period 2/3-3/26/25    Authorization - Visit Number 11    Authorization - Number of Visits 16    PT Start Time 0849    PT Stop Time 0928    PT Time Calculation (min) 39 min    Activity Tolerance Patient tolerated treatment well    Behavior During Therapy [facility] for tasks assessed/performed                    Past Medical History:  Diagnosis Date   Abnormal findings on diagnostic imaging of lung 09/23/2019   09/22/2019-lung cancer screening-centrilobular and paraseptal emphysema, calcified granulomata noted bilaterally, no suspicious nodules, lung RADS 1, repeat in 12 months    Anxiety    Atrial fibrillation (HCC) 09/23/2019   Centrilobular emphysema (HCC) 09/23/2019   09/22/2019-lung cancer screening-centrilobular and paraseptal emphysema, calcified granulomata noted bilaterally, no suspicious nodules, lung RADS 1, repeat in 12 months  09/23/2019-FVC 3.74 (81% predicted), postbronchodilator ratio 80, postbronchodilator FEV1 2.88 (86% predicted), no bronchodilator response, DLCO 19.44 (73% predicted)   DDD (degenerative disc disease), cervical 05/13/2008   Cervical Disc Degeneration   Depression    ETOH abuse 10/01/2023   continues to drink 1-3 drinks/ day   History of adenomatous polyp of colon 03/03/2018   05/14/17: Colonoscopy: nonadvanced adenoma, f/u 5 yrs, use SuPrep, Murphy/GAP  Last Assessment & Plan:  Relevant Hx: Course: Daily Update: Today's Plan:   History of kidney stones    Hyperlipidemia    Kidney stone    Nephrolithiasis 05/22/2011   Nontraumatic incomplete tear of right rotator cuff 10/14/2018   Other and  unspecified hyperlipidemia 11/20/2009   Hyperlipidemia   RBBB 06/09/2012   Secondary hypercoagulable state (HCC) 09/28/2019   Shortness of breath 09/23/2019   Past Surgical History:  Procedure Laterality Date   CARDIOVERSION N/A 10/15/2019   Procedure: CARDIOVERSION;  Surgeon: Jake Bathe, MD;  Location: [facility];  Service: Cardiovascular;  Laterality: N/A;   CARDIOVERSION N/A 11/29/2019   Procedure: CARDIOVERSION;  Surgeon: Lars Masson, MD;  Location: [facility] ENDOSCOPY;  Service: Cardiovascular;  Laterality: N/A;   HEMORRHOID SURGERY     REVERSE SHOULDER ARTHROPLASTY Left 10/09/2023   Procedure: REVERSE SHOULDER ARTHROPLASTY;  Surgeon: Bjorn Pippin, MD;  Location: [facility];  Service: Orthopedics;  Laterality: Left;   TOTAL HIP ARTHROPLASTY Right 12/21/2021   Procedure: TOTAL HIP ARTHROPLASTY ANTERIOR APPROACH;  Surgeon: Jodi Geralds, MD;  Location: [facility] ORS;  Service: Orthopedics;  Laterality: Right;   VARICOSE VEIN SURGERY Right    Patient Active Problem List   Diagnosis Date Noted   Alcohol-induced mood disorder with depressive symptoms (HCC) 06/10/2023   Benign prostatic hyperplasia without lower urinary tract symptoms 06/05/2023   Dyslipidemia 06/05/2023   Fall 06/05/2023   History of peripheral neuropathy 06/05/2023   Subdural hematoma (HCC) 06/04/2023   Pain in finger of left hand 03/12/2023   Alcohol abuse 05/31/2022   Disorder of thoracic spine 05/31/2022   COVID-19 virus infection 03/04/2022   Acute bronchitis 03/04/2022   OSA (obstructive sleep apnea) 03/04/2022   PAF (paroxysmal atrial fibrillation) (HCC) 12/13/2021  Anxiety 11/08/2021   Aphasia 11/08/2021   Bipolar 1 disorder (HCC) 11/08/2021   CKD (chronic kidney disease), stage III (HCC) 11/08/2021   Expressive aphasia 11/08/2021   Prolonged QT interval 11/08/2021   Recurrent falls 11/08/2021   Primary osteoarthritis of right hip 10/26/2021   Abnormal liver function tests 09/10/2021    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   Urinary hesitancy 03/08/2021   Thoracic aortic aneurysm without rupture (HCC) 03/08/2021   Senile purpura (HCC) 09/07/2020   Aortic atherosclerosis (HCC) 09/07/2020   Sinus bradycardia 03/31/2020   Essential hypertension 03/31/2020   Major depressive disorder 03/07/2020   Essential tremor 03/07/2020   Tremor 01/21/2020   Obesity (BMI 30-39.9) 12/10/2019   Chest pain of uncertain etiology 12/10/2019   Secondary hypercoagulable state (HCC) 09/28/2019   Centrilobular emphysema (HCC) 09/23/2019   Former smoker 09/23/2019   Abnormal findings on diagnostic imaging of lung 09/23/2019   Atrial fibrillation (HCC) 09/23/2019   At risk for sleep apnea 09/23/2019   Shortness of breath 09/23/2019   Nontraumatic incomplete tear of right rotator cuff 10/14/2018   History of adenomatous polyp of colon 03/03/2018   RBBB 06/09/2012   Nephrolithiasis 05/22/2011   Mixed hyperlipidemia 11/20/2009   DDD (degenerative disc disease), cervical 05/13/2008    PCP: Everrett Coombe, DO REFERRING PROVIDER: Ramond Marrow, MD REFERRING DIAG: [phone] (ICD-10-CM) - Status post reverse total arthroplasty of left shoulder  THERAPY DIAG:  Acute pain of left shoulder  Muscle weakness (generalized)  Localized edema  Abnormal posture  Rationale for Evaluation and Treatment: Rehabilitation  ONSET DATE: 10/09/23 DATE OF SURGERY  SUBJECTIVE:                                                                                                                                                                                     SUBJECTIVE STATEMENT: "Feels pretty good right now and after I do my exercises."   Hand dominance: Right  PERTINENT HISTORY: H/o a-fib, loop recorder, ETOH use, depression  PAIN:  Are you having pain? Yes: NPRS scale: none currently; 2 at worst/10 Pain location: Lt shoulder Pain description: sore Aggravating factors: overhead reaching Relieving factors:  medicine  PRECAUTIONS: Shoulder and Other: reverse total shoulder replacement  to follow protocol for reverse total shoulder Phase IV 12/18/23 to 01/01/24: Standing AROM, 1-3 lbs weights Phase V: No ROM restrictions  WEIGHT BEARING RESTRICTIONS: Yes < 5 lb per protocol until 01/01/24 and then advance to tolerance    FALLS:  Has patient fallen in last 6 months? Yes. Number of falls 2x in the past 6 months. He has a h/o drinking and felt like gabapentin + alcohol led to falls.    LIVING  ENVIRONMENT: Lives with: lives with their spouse Lives in: House/apartment Stairs:  2 to garage and 2 to deck in back  OCCUPATION: Retired from [employer]; enjoys repairing things  PATIENT GOALS:reduced pain and improve use of the arm "as close to normal as I can get"  NEXT MD VISIT:  01/04/24  OBJECTIVE:  Note: Objective measures were completed at Evaluation unless otherwise noted.  PATIENT SURVEYS:  FOTO 44 11/04/23: 44%  11/13/23: 58% function  12/04/23: 59% function  01/05/24: 66% function   EDEMA: Pocket of edema in distal medial arm, bruising in proximal biceps bruising and edema noted dressing in place  POSTURE: Rounded shoulders and forward head position  UPPER EXTREMITY ROM:    ROM Left 12/13 11/06/23 Left  11/13/23 Left  11/25/23 Left  11/27/23 Left  12/04/23 Left  12/11/23 Left  01/01/24 01/08/24 Left  01/19/24 Left   Shoulder flexion 75 140 PROM  135 PROM 136 AROM  138 AROM 140 AROM 140 AROM sup 145 AROM supine  122 AROM standing   Shoulder extension            Shoulder abduction  110 PROM 112 PROM   95 AROM 97 AROM 105 AROM sidelying 105 AROM supine   Shoulder adduction            Shoulder internal rotation            Shoulder external rotation 0 32 PROM  42 PROM 45 PROM 56 PROM 30 AROM 36 AROM 40 AROM sup 45 AROM supine    Elbow flexion            Elbow extension            Wrist flexion            Wrist extension            Wrist ulnar deviation            Wrist radial deviation             Wrist pronation            Wrist supination            (Blank rows = not tested)  UPPER EXTREMITY MMT:  wrist flexion/extension grip strength 35 L and 60 R UNABLE-- will assess once protocol allows MMT Right eval Left 12/04/23  Shoulder flexion  3-  Shoulder extension    Shoulder abduction  3-  Shoulder adduction    Shoulder internal rotation  Deferred   Shoulder external rotation  Deferred   Middle trapezius    Lower trapezius    Elbow flexion    Elbow extension    Wrist flexion    Wrist extension    Wrist ulnar deviation    Wrist radial deviation    Wrist pronation    Wrist supination    Grip strength (lbs)    (Blank rows = not tested) [facility] Adult PT Treatment:                                                DATE: 01/26/24  Neuromuscular re-ed: Sidelying ER 2 x 10 @ 1 lb Prone row 2 x 10 @ 3 lbs  Prone single arm T 2 x 10  Prone single arm extension 2 x 10  Therapeutic Activity: Pulleys flexion x 2 minutes  Supine shoulder flexion  red band 2 x 10  Farmer's carry 1 lap @ 5 lbs, 1 lap @ 10 lbs  Med ball lift from table 15 lbs x 10  Standing shoulder abduction AAROM with dowel x 10     [facility] Adult PT Treatment:                                                DATE: 01/23/24 Therapeutic Exercise: Resisted shoulder IR/ER red band 2 x 10   Neuromuscular re-ed: Resisted rows blue band 2 x 10  Resisted horizontal shoulder abduction yellow band 2 x 10  Therapeutic Activity: Wall walkups orange physioball x 10  Seated shoulder flexion AROM 1# 3 x 5  Seated shoulder abduction AROM long lever 2 x 10    [facility] Adult PT Treatment:                                                DATE: 01/19/24  Neuromuscular re-ed: Sidelying ER 2 x 10 @ 1 lb  Inclined shoulder taps at counter 2 x 10  Therapeutic Activity: Pulleys flexion, scaption x 2 minutes each  Seated shoulder flexion AROM long lever 2 x 5  Seated shoulder scaption AROM long lever 2 x 5  Seated shoulder abduction AROM  long lever 2 x 5 Functional IR stretch with strap x 10; 5 sec hold  Sled pushing 1 lap, no weight Sled pulling x 15 ft, no weight  Shoulder extension AAROM with dowel x 10  Modalities: Game ready (vaso) Lt shoulder, 34 deg, med compression x 10 min       PATIENT EDUCATION: Education details: HEP review  Person educated: Patient  Education method: Programmer, multimedia, Education comprehension: verbalized understanding,   HOME EXERCISE PROGRAM: Access Code: [email or web address] URL: [email or web address] Date: 01/19/2024 Prepared by: Letitia Libra  Exercises - Seated Shoulder Scaption AAROM with Pulley at Side  - 1 x daily - 7 x weekly - 2 sets - 10 reps - Shoulder Flexion Wall Slide with Towel  - 1 x daily - 7 x weekly - 1 sets - 10 reps - 5 sec  hold - Standing Shoulder Abduction Slides at Wall  - 1 x daily - 7 x weekly - 1 sets - 10 reps - 5 sec  hold - Standing Shoulder Internal Rotation Stretch with Towel  - 1 x daily - 7 x weekly - 1 sets - 10 reps - 5 sec  hold - Standing Shoulder Flexion to 90 Degrees  - 1 x daily - 7 x weekly - 3 sets - 5 reps - Shoulder Abduction with Dumbbells - Palms Down  - 1 x daily - 7 x weekly - 3 sets - 5 reps - Sidelying Shoulder External Rotation  - 1 x daily - 7 x weekly - 2 sets - 10 reps - Shoulder Taps on Table  - 1 x daily - 7 x weekly - 2 sets - 10 reps - Standing Shoulder Row with Anchored Resistance  - 1 x daily - 3 x weekly - 2 sets - 10 reps - Seated Single Arm Bicep Curls with Rotation and Dumbbell  - 1 x daily - 3 x weekly - 2 sets - 10 reps - Shoulder External Rotation  and Scapular Retraction with Resistance  - 1 x daily - 3 x weekly - 2 sets - 10 reps - Supine Shoulder Horizontal Abduction with Resistance  - 1 x daily - 3 x weekly - 2 sets - 10 reps  ASSESSMENT:  CLINICAL IMPRESSION: Continued with shoulder ROM and strength progression with good tolerance. Challenged with prone strength progression most notable with single arm T  requiring tactile cues to reduce compensatory trunk rotation. Able to introduce carrying and lifting activity from 5-15 lbs without onset of shoulder pain and no signs of instability.   OBJECTIVE IMPAIRMENTS: decreased activity tolerance, decreased ROM, decreased strength, increased edema, increased fascial restrictions, increased muscle spasms, impaired flexibility, impaired tone, postural dysfunction, and pain.     GOALS: Goals reviewed with patient? Yes  SHORT TERM GOALS: Target date: 11/13/23  The patient will be indep with HEP. Baseline: initiated at eval Goal status: MET  2.  The patient will have PROM documented. Baseline:  began <1 week with elbow and wrist ROM + grip and neck ROM Goal status: MET   LONG TERM GOALS: Target date: 01/31/24  The patient will be indep with HEP. Baseline:  initiated at eval Goal status: progressing   2.  The patient will improve ROM to protocol ranges of flexion to 110-120, ER 30-45. Baseline: not assessed at eval 12/04/23: met PROM, progressing AROM per protocol  Goal status: MET   3.  The patient will tolerate isometrics below shoulder level. Baseline:  not assessed at eval Goal status: MET   4.  The patient will reach into shoulder flexion/scaption to 90 degrees to demo improved strength against gravity. Baseline: not assessed at eval. 11/13/23: deferred due to post-op restrictions  12/04/23: flexion to 138 in supine  Goal status: ongoing    5.  The patient will reduce pain to < or equal to 2/10. Baseline:  5-9/10 12/04/23: 6 at worst  Goal status: progressing   6.  Pt will improve FOTO to >= 74 Baseline: see above  Goal status: progressing   7. Patient will demonstrate at least 4/5 gross Lt shoulder strength to improve ability to lift and carry items.   Baseline: see MMT chart   Goal status: NEW  PLAN:  PT FREQUENCY: 1-2x/week  PT DURATION: 8 weeks  PLANNED INTERVENTIONS: 97164- PT Re-evaluation, 97110-Therapeutic exercises,  97530- Therapeutic activity, 97112- Neuromuscular re-education, 97535- Self Care, [id number]- Manual therapy, 97014- Electrical stimulation (unattended), 97016- Vasopneumatic device, Patient/Family education, Taping, Dry Needling, Joint mobilization, Cryotherapy, and Moist heat  PLAN FOR NEXT SESSION: progress per protocol; re-eval.   Letitia Libra, PT, DPT, ATC 01/26/24 9:31 AM

## 2024-01-29 ENCOUNTER — Ambulatory Visit: Payer: Medicare HMO

## 2024-01-29 DIAGNOSIS — R293 Abnormal posture: Secondary | ICD-10-CM

## 2024-01-29 DIAGNOSIS — R55 Syncope and collapse: Secondary | ICD-10-CM | POA: Diagnosis not present

## 2024-01-29 DIAGNOSIS — R600 Localized edema: Secondary | ICD-10-CM

## 2024-01-29 DIAGNOSIS — M6281 Muscle weakness (generalized): Secondary | ICD-10-CM | POA: Diagnosis not present

## 2024-01-29 DIAGNOSIS — M25512 Pain in left shoulder: Secondary | ICD-10-CM | POA: Diagnosis not present

## 2024-01-29 DIAGNOSIS — Z4509 Encounter for adjustment and management of other cardiac device: Secondary | ICD-10-CM | POA: Diagnosis not present

## 2024-01-29 NOTE — Therapy (Signed)
 OUTPATIENT PHYSICAL THERAPY SHOULDER TREATMENT RE-CERTIFICATION Progress Note Reporting Period 01/01/24 to 01/29/24  See note below for Objective Data and Assessment of Progress/Goals.      Patient Name: Joshua [name] [facility] III MRN: [id number] DOB:April 04, 1946, 78 y.o., male Today's Date: 01/29/2024  END OF SESSION:  PT End of Session - 01/29/24 0845     Visit Number 28    Number of Visits 34    Date for PT Re-Evaluation 03/13/24    Authorization Type [employer] medicare    Authorization Time Period 2/3-3/29/25    Authorization - Visit Number 12    Authorization - Number of Visits 16    Progress Note Due on Visit 38    PT Start Time 0845    PT Stop Time 0926    PT Time Calculation (min) 41 min    Activity Tolerance Patient tolerated treatment well    Behavior During Therapy [facility] for tasks assessed/performed                     Past Medical History:  Diagnosis Date   Abnormal findings on diagnostic imaging of lung 09/23/2019   09/22/2019-lung cancer screening-centrilobular and paraseptal emphysema, calcified granulomata noted bilaterally, no suspicious nodules, lung RADS 1, repeat in 12 months    Anxiety    Atrial fibrillation (HCC) 09/23/2019   Centrilobular emphysema (HCC) 09/23/2019   09/22/2019-lung cancer screening-centrilobular and paraseptal emphysema, calcified granulomata noted bilaterally, no suspicious nodules, lung RADS 1, repeat in 12 months  09/23/2019-FVC 3.74 (81% predicted), postbronchodilator ratio 80, postbronchodilator FEV1 2.88 (86% predicted), no bronchodilator response, DLCO 19.44 (73% predicted)   DDD (degenerative disc disease), cervical 05/13/2008   Cervical Disc Degeneration   Depression    ETOH abuse 10/01/2023   continues to drink 1-3 drinks/ day   History of adenomatous polyp of colon 03/03/2018   05/14/17: Colonoscopy: nonadvanced adenoma, f/u 5 yrs, use SuPrep, Murphy/GAP  Last Assessment & Plan:  Relevant Hx: Course: Daily Update: Today's  Plan:   History of kidney stones    Hyperlipidemia    Kidney stone    Nephrolithiasis 05/22/2011   Nontraumatic incomplete tear of right rotator cuff 10/14/2018   Other and unspecified hyperlipidemia 11/20/2009   Hyperlipidemia   RBBB 06/09/2012   Secondary hypercoagulable state (HCC) 09/28/2019   Shortness of breath 09/23/2019   Past Surgical History:  Procedure Laterality Date   CARDIOVERSION N/A 10/15/2019   Procedure: CARDIOVERSION;  Surgeon: Jake Bathe, MD;  Location: [facility] ENDOSCOPY;  Service: Cardiovascular;  Laterality: N/A;   CARDIOVERSION N/A 11/29/2019   Procedure: CARDIOVERSION;  Surgeon: Lars Masson, MD;  Location: [facility] ENDOSCOPY;  Service: Cardiovascular;  Laterality: N/A;   HEMORRHOID SURGERY     REVERSE SHOULDER ARTHROPLASTY Left 10/09/2023   Procedure: REVERSE SHOULDER ARTHROPLASTY;  Surgeon: Bjorn Pippin, MD;  Location: [facility];  Service: Orthopedics;  Laterality: Left;   TOTAL HIP ARTHROPLASTY Right 12/21/2021   Procedure: TOTAL HIP ARTHROPLASTY ANTERIOR APPROACH;  Surgeon: Jodi Geralds, MD;  Location: [facility] ORS;  Service: Orthopedics;  Laterality: Right;   VARICOSE VEIN SURGERY Right    Patient Active Problem List   Diagnosis Date Noted   Alcohol-induced mood disorder with depressive symptoms (HCC) 06/10/2023   Benign prostatic hyperplasia without lower urinary tract symptoms 06/05/2023   Dyslipidemia 06/05/2023   Fall 06/05/2023   History of peripheral neuropathy 06/05/2023   Subdural hematoma (HCC) 06/04/2023   Pain in finger of left hand 03/12/2023   Alcohol abuse 05/31/2022  Disorder of thoracic spine 05/31/2022   COVID-19 virus infection 03/04/2022   Acute bronchitis 03/04/2022   OSA (obstructive sleep apnea) 03/04/2022   PAF (paroxysmal atrial fibrillation) (HCC) 12/13/2021   Anxiety 11/08/2021   Aphasia 11/08/2021   Bipolar 1 disorder (HCC) 11/08/2021   CKD (chronic kidney disease), stage III (HCC) 11/08/2021   Expressive  aphasia 11/08/2021   Prolonged QT interval 11/08/2021   Recurrent falls 11/08/2021   Primary osteoarthritis of right hip 10/26/2021   Abnormal liver function tests 09/10/2021   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   Urinary hesitancy 03/08/2021   Thoracic aortic aneurysm without rupture (HCC) 03/08/2021   Senile purpura (HCC) 09/07/2020   Aortic atherosclerosis (HCC) 09/07/2020   Sinus bradycardia 03/31/2020   Essential hypertension 03/31/2020   Major depressive disorder 03/07/2020   Essential tremor 03/07/2020   Tremor 01/21/2020   Obesity (BMI 30-39.9) 12/10/2019   Chest pain of uncertain etiology 12/10/2019   Secondary hypercoagulable state (HCC) 09/28/2019   Centrilobular emphysema (HCC) 09/23/2019   Former smoker 09/23/2019   Abnormal findings on diagnostic imaging of lung 09/23/2019   Atrial fibrillation (HCC) 09/23/2019   At risk for sleep apnea 09/23/2019   Shortness of breath 09/23/2019   Nontraumatic incomplete tear of right rotator cuff 10/14/2018   History of adenomatous polyp of colon 03/03/2018   RBBB 06/09/2012   Nephrolithiasis 05/22/2011   Mixed hyperlipidemia 11/20/2009   DDD (degenerative disc disease), cervical 05/13/2008    PCP: Everrett Coombe, DO REFERRING PROVIDER: Ramond Marrow, MD REFERRING DIAG: [phone] (ICD-10-CM) - Status post reverse total arthroplasty of left shoulder  THERAPY DIAG:  Acute pain of left shoulder  Muscle weakness (generalized)  Localized edema  Abnormal posture  Rationale for Evaluation and Treatment: Rehabilitation  ONSET DATE: 10/09/23 DATE OF SURGERY  SUBJECTIVE:                                                                                                                                                                                     SUBJECTIVE STATEMENT: Patient reports the pain is not unbearable, but the pain is still present with reaching movements. Patient reports difficulty with washing and is uncomfortable to lay on the Lt side.    Hand dominance: Right  PERTINENT HISTORY: H/o a-fib, loop recorder, ETOH use, depression  PAIN:  Are you having pain? Yes: NPRS scale: none currently; 2 at worst/10 Pain location: Lt shoulder Pain description: sore Aggravating factors: reaching Relieving factors: medicine  PRECAUTIONS: Shoulder and Other: reverse total shoulder replacement  to follow protocol for reverse total shoulder Phase IV 12/18/23 to 01/01/24: Standing AROM, 1-3 lbs weights Phase V: No ROM restrictions  WEIGHT BEARING RESTRICTIONS: Yes <  5 lb per protocol until 01/01/24 and then advance to tolerance    FALLS:  Has patient fallen in last 6 months? Yes. Number of falls 2x in the past 6 months. He has a h/o drinking and felt like gabapentin + alcohol led to falls.    LIVING ENVIRONMENT: Lives with: lives with their spouse Lives in: House/apartment Stairs:  2 to garage and 2 to deck in back  OCCUPATION: Retired from [employer]; enjoys repairing things  PATIENT GOALS:reduced pain and improve use of the arm "as close to normal as I can get"  NEXT MD VISIT:  01/04/24  OBJECTIVE:  Note: Objective measures were completed at Evaluation unless otherwise noted.  PATIENT SURVEYS:  FOTO 44 11/04/23: 44%  11/13/23: 58% function  12/04/23: 59% function  01/05/24: 66% function  01/29/24: 77% function   EDEMA: Pocket of edema in distal medial arm, bruising in proximal biceps bruising and edema noted dressing in place  POSTURE: Rounded shoulders and forward head position  UPPER EXTREMITY ROM:    ROM Left 12/13 11/06/23 Left  11/13/23 Left  11/25/23 Left  11/27/23 Left  12/04/23 Left  12/11/23 Left  01/01/24 01/08/24 Left  01/19/24 Left  01/29/24 Left   Shoulder flexion 75 140 PROM  135 PROM 136 AROM  138 AROM 140 AROM 140 AROM sup 145 AROM supine  122 AROM standing  140 AROM standing   Shoulder scaption           120 AROM standing   Shoulder abduction  110 PROM 112 PROM   95 AROM 97 AROM 105 AROM sidelying 105 AROM supine   80 AROM standing   Shoulder adduction             Shoulder internal rotation             Shoulder external rotation 0 32 PROM  42 PROM 45 PROM 56 PROM 30 AROM 36 AROM 40 AROM sup 45 AROM supine   50 AROM supine   Functional crossover reach           Can reach to Rt anterior shoulder  Elbow extension             Wrist flexion             Wrist extension             Wrist ulnar deviation             Wrist radial deviation             Wrist pronation             Wrist supination             (Blank rows = not tested)  UPPER EXTREMITY MMT:  wrist flexion/extension grip strength 35 L and 60 R UNABLE-- will assess once protocol allows MMT Right eval Left 12/04/23 01/29/24 Left   Shoulder flexion  3- 4- in available range  Shoulder extension     Shoulder abduction  3- 4+ in available range  Shoulder adduction     Shoulder internal rotation  Deferred  4-  Shoulder external rotation  Deferred  4+  Middle trapezius     Lower trapezius     Elbow flexion     Elbow extension     Wrist flexion     Wrist extension     Wrist ulnar deviation     Wrist radial deviation     Wrist pronation     Wrist supination  Grip strength (lbs)     (Blank rows = not tested) [facility] Adult PT Treatment:                                                DATE: 01/29/24 Therapeutic Exercise: Crossover stretch 2 x 30 sec  Sidelying shoulder abduction 2# 2 x 10  Updated HEP  Neuromuscular re-ed: Resisted shoulder IR red band 2 x 10  Resisted shoulder adduction red band 2 x 10  Standing resisted shoulder extension red band 2 x 10 Therapeutic Activity: Re-assessment to determine overall progress, educating patient on progress towards goals.     [facility] Adult PT Treatment:                                                DATE: 01/26/24  Neuromuscular re-ed: Sidelying ER 2 x 10 @ 1 lb Prone row 2 x 10 @ 3 lbs  Prone single arm T 2 x 10  Prone single arm extension 2 x 10  Therapeutic Activity: Pulleys flexion x 2  minutes  Supine shoulder flexion red band 2 x 10  Farmer's carry 1 lap @ 5 lbs, 1 lap @ 10 lbs  Med ball lift from table 15 lbs x 10  Standing shoulder abduction AAROM with dowel x 10     [facility] Adult PT Treatment:                                                DATE: 01/23/24 Therapeutic Exercise: Resisted shoulder IR/ER red band 2 x 10   Neuromuscular re-ed: Resisted rows blue band 2 x 10  Resisted horizontal shoulder abduction yellow band 2 x 10  Therapeutic Activity: Wall walkups orange physioball x 10  Seated shoulder flexion AROM 1# 3 x 5  Seated shoulder abduction AROM long lever 2 x 10        PATIENT EDUCATION: Education details: HEP update  Person educated: Patient  Education method: Explanation, Education comprehension: verbalized understanding,   HOME EXERCISE PROGRAM: Access Code: [email or web address] URL: [email or web address] Date: 01/29/2024 Prepared by: Letitia Libra  Exercises - Standing Shoulder Posterior Capsule Stretch  - 1 x daily - 7 x weekly - 3 sets - 30 sec  hold - Seated Shoulder Scaption AAROM with Pulley at Side  - 1 x daily - 7 x weekly - 2 sets - 10 reps - Shoulder Flexion Wall Slide with Towel  - 1 x daily - 7 x weekly - 1 sets - 10 reps - 5 sec  hold - Standing Shoulder Abduction Slides at Wall  - 1 x daily - 7 x weekly - 1 sets - 10 reps - 5 sec  hold - Standing Shoulder Internal Rotation Stretch with Towel  - 1 x daily - 7 x weekly - 1 sets - 10 reps - 5 sec  hold - Standing Shoulder Flexion to 90 Degrees  - 1 x daily - 7 x weekly - 3 sets - 5 reps - Shoulder Abduction with Dumbbells - Palms Down  - 1 x daily - 7 x weekly - 3 sets -  5 reps - Shoulder Taps on Table  - 1 x daily - 7 x weekly - 2 sets - 10 reps - Standing Shoulder Row with Anchored Resistance  - 1 x daily - 3 x weekly - 2 sets - 10 reps - Seated Single Arm Bicep Curls with Rotation and Dumbbell  - 1 x daily - 3 x weekly - 2 sets - 10 reps - Shoulder External Rotation and  Scapular Retraction with Resistance  - 1 x daily - 3 x weekly - 2 sets - 10 reps - Supine Shoulder Horizontal Abduction with Resistance  - 1 x daily - 3 x weekly - 2 sets - 10 reps - Shoulder Internal Rotation with Resistance  - 1 x daily - 3 x weekly - 2 sets - 10 reps - Sidelying Shoulder External Rotation  - 1 x daily - 3 x weekly - 2 sets - 10 reps  ASSESSMENT:  CLINICAL IMPRESSION: Issachar is making steady progress s/p Lt reverse TSA demonstrating overall improvements in Lt shoulder ROM and strength. He continues to report difficulty with bathing due to ROM restrictions with most limitations noted with functional reaching and abduction ROM. Strength deficits remain mostly with internal rotation and flexion. He will benefit from continued skilled PT 1 x week for 6 additional weeks to address lingering mobility and strength deficits in order to optimize his function.   OBJECTIVE IMPAIRMENTS: decreased activity tolerance, decreased ROM, decreased strength, increased edema, increased fascial restrictions, increased muscle spasms, impaired flexibility, impaired tone, postural dysfunction, and pain.     GOALS: Goals reviewed with patient? Yes  SHORT TERM GOALS: Target date: 11/13/23  The patient will be indep with HEP. Baseline: initiated at eval Goal status: MET  2.  The patient will have PROM documented. Baseline:  began <1 week with elbow and wrist ROM + grip and neck ROM Goal status: MET   LONG TERM GOALS: Target date: 03/13/24  The patient will be indep with HEP. Baseline:  initiated at eval Goal status: progressing   2.  The patient will improve ROM to protocol ranges of flexion to 110-120, ER 30-45. Baseline: not assessed at eval 12/04/23: met PROM, progressing AROM per protocol  Goal status: MET   3.  The patient will tolerate isometrics below shoulder level. Baseline:  not assessed at eval Goal status: MET   4.  The patient will reach into shoulder flexion/scaption to 90  degrees to demo improved strength against gravity. Baseline: not assessed at eval. 11/13/23: deferred due to post-op restrictions  12/04/23: flexion to 138 in supine  Goal status: MET    5.  The patient will reduce pain to < or equal to 2/10. Baseline:  5-9/10 12/04/23: 6 at worst  Goal status: MET   6.  Pt will improve FOTO to >= 74 Baseline: see above  Goal status: MET   7. Patient will demonstrate at least 4/5 gross Lt shoulder strength to improve ability to lift and carry items.   Baseline: see MMT chart   Goal status: progressing   8. Patient will be able to reach to lateral scapular border with crossover functional reach with LUE to improve ability to complete bathing activity.    Baseline: see ROM chart above   Goal status: NEW PLAN:  PT FREQUENCY: 1x/week  PT DURATION: 6 weeks  PLANNED INTERVENTIONS: 97164- PT Re-evaluation, 97110-Therapeutic exercises, 97530- Therapeutic activity, 97112- Neuromuscular re-education, 97535- Self Care, 62952- Manual therapy, 97014- Electrical stimulation (unattended), 97016- Vasopneumatic device, Patient/Family education,  Taping, Dry Needling, Joint mobilization, Cryotherapy, and Moist heat  PLAN FOR NEXT SESSION: progress LUE ROM and strengthening as tolerated   Letitia Libra, PT, DPT, ATC 01/29/24 3:27 PM  Referring diagnosis? U13.244 (ICD-10-CM) - Status post reverse total arthroplasty of left shoulder Treatment diagnosis? (if different than referring diagnosis)  Acute pain of left shoulder  Muscle weakness (generalized)  Localized edema  Abnormal posture What was this (referring dx) caused by? [x]  Surgery []  Fall []  Ongoing issue []  Arthritis []  Other: ____________  Laterality: []  Rt [x]  Lt []  Both  Check all possible CPT codes:  *CHOOSE 10 OR LESS*    See Planned Interventions listed in the Plan section of the Evaluation.

## 2024-02-03 DIAGNOSIS — Z4802 Encounter for removal of sutures: Secondary | ICD-10-CM | POA: Diagnosis not present

## 2024-02-05 ENCOUNTER — Ambulatory Visit: Attending: Orthopaedic Surgery

## 2024-02-05 DIAGNOSIS — R293 Abnormal posture: Secondary | ICD-10-CM | POA: Diagnosis not present

## 2024-02-05 DIAGNOSIS — M25512 Pain in left shoulder: Secondary | ICD-10-CM | POA: Insufficient documentation

## 2024-02-05 DIAGNOSIS — R600 Localized edema: Secondary | ICD-10-CM | POA: Diagnosis not present

## 2024-02-05 DIAGNOSIS — M6281 Muscle weakness (generalized): Secondary | ICD-10-CM | POA: Diagnosis not present

## 2024-02-05 NOTE — Therapy (Signed)
 OUTPATIENT PHYSICAL THERAPY SHOULDER TREATMENT     Patient Name: Joshua [name] [facility] MRN: [id number] DOB:02/01/1946, 78 y.o., male Today's Date: 02/05/2024  END OF SESSION:  PT End of Session - 02/05/24 1100     Visit Number 29    Number of Visits 34    Date for PT Re-Evaluation 03/13/24    Authorization Type [employer] medicare    Authorization Time Period 4/3-5/10    Authorization - Visit Number 1    Authorization - Number of Visits 8    Progress Note Due on Visit 38    PT Start Time 1100    PT Stop Time 1140    PT Time Calculation (min) 40 min    Activity Tolerance Patient tolerated treatment well    Behavior During Therapy WFL for tasks assessed/performed                      Past Medical History:  Diagnosis Date   Abnormal findings on diagnostic imaging of lung 09/23/2019   09/22/2019-lung cancer screening-centrilobular and paraseptal emphysema, calcified granulomata noted bilaterally, no suspicious nodules, lung RADS 1, repeat in 12 months    Anxiety    Atrial fibrillation (HCC) 09/23/2019   Centrilobular emphysema (HCC) 09/23/2019   09/22/2019-lung cancer screening-centrilobular and paraseptal emphysema, calcified granulomata noted bilaterally, no suspicious nodules, lung RADS 1, repeat in 12 months  09/23/2019-FVC 3.74 (81% predicted), postbronchodilator ratio 80, postbronchodilator FEV1 2.88 (86% predicted), no bronchodilator response, DLCO 19.44 (73% predicted)   DDD (degenerative disc disease), cervical 05/13/2008   Cervical Disc Degeneration   Depression    ETOH abuse 10/01/2023   continues to drink 1-3 drinks/ day   History of adenomatous polyp of colon 03/03/2018   05/14/17: Colonoscopy: nonadvanced adenoma, f/u 5 yrs, use SuPrep, Murphy/GAP  Last Assessment & Plan:  Relevant Hx: Course: Daily Update: Today's Plan:   History of kidney stones    Hyperlipidemia    Kidney stone    Nephrolithiasis 05/22/2011   Nontraumatic incomplete tear of right  rotator cuff 10/14/2018   Other and unspecified hyperlipidemia 11/20/2009   Hyperlipidemia   RBBB 06/09/2012   Secondary hypercoagulable state (HCC) 09/28/2019   Shortness of breath 09/23/2019   Past Surgical History:  Procedure Laterality Date   CARDIOVERSION N/A 10/15/2019   Procedure: CARDIOVERSION;  Surgeon: Jake Bathe, MD;  Location[facility] ENDOSCOPY;  Service: Cardiovascular;  Laterality: N/A;   CARDIOVERSION N/A 11/29/2019   Procedure: CARDIOVERSION;  Surgeon: Lars Masson, MD;  Location: [facility] ENDOSCOPY;  Service: Cardiovascular;  Laterality: N/A;   HEMORRHOID SURGERY     REVERSE SHOULDER ARTHROPLASTY Left 10/09/2023   Procedure: REVERSE SHOULDER ARTHROPLASTY;  Surgeon: Bjorn Pippin, MD;  Location: [facility];  Service: Orthopedics;  Laterality: Left;   TOTAL HIP ARTHROPLASTY Right 12/21/2021   Procedure: TOTAL HIP ARTHROPLASTY ANTERIOR APPROACH;  Surgeon: Jodi Geralds, MD;  Location: [facility] ORS;  Service: Orthopedics;  Laterality: Right;   VARICOSE VEIN SURGERY Right    Patient Active Problem List   Diagnosis Date Noted   Alcohol-induced mood disorder with depressive symptoms (HCC) 06/10/2023   Benign prostatic hyperplasia without lower urinary tract symptoms 06/05/2023   Dyslipidemia 06/05/2023   Fall 06/05/2023   History of peripheral neuropathy 06/05/2023   Subdural hematoma (HCC) 06/04/2023   Pain in finger of left hand 03/12/2023   Alcohol abuse 05/31/2022   Disorder of thoracic spine 05/31/2022   COVID-19 virus infection 03/04/2022   Acute bronchitis 03/04/2022  OSA (obstructive sleep apnea) 03/04/2022   PAF (paroxysmal atrial fibrillation) (HCC) 12/13/2021   Anxiety 11/08/2021   Aphasia 11/08/2021   Bipolar 1 disorder (HCC) 11/08/2021   CKD (chronic kidney disease), stage III (HCC) 11/08/2021   Expressive aphasia 11/08/2021   Prolonged QT interval 11/08/2021   Recurrent falls 11/08/2021   Primary osteoarthritis of right hip 10/26/2021    Abnormal liver function tests 09/10/2021   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   Urinary hesitancy 03/08/2021   Thoracic aortic aneurysm without rupture (HCC) 03/08/2021   Senile purpura (HCC) 09/07/2020   Aortic atherosclerosis (HCC) 09/07/2020   Sinus bradycardia 03/31/2020   Essential hypertension 03/31/2020   Major depressive disorder 03/07/2020   Essential tremor 03/07/2020   Tremor 01/21/2020   Obesity (BMI 30-39.9) 12/10/2019   Chest pain of uncertain etiology 12/10/2019   Secondary hypercoagulable state (HCC) 09/28/2019   Centrilobular emphysema (HCC) 09/23/2019   Former smoker 09/23/2019   Abnormal findings on diagnostic imaging of lung 09/23/2019   Atrial fibrillation (HCC) 09/23/2019   At risk for sleep apnea 09/23/2019   Shortness of breath 09/23/2019   Nontraumatic incomplete tear of right rotator cuff 10/14/2018   History of adenomatous polyp of colon 03/03/2018   RBBB 06/09/2012   Nephrolithiasis 05/22/2011   Mixed hyperlipidemia 11/20/2009   DDD (degenerative disc disease), cervical 05/13/2008    PCP: Everrett Coombe, DO REFERRING PROVIDER: Ramond Marrow, MD REFERRING DIAG: [phone] (ICD-10-CM) - Status post reverse total arthroplasty of left shoulder  THERAPY DIAG:  Acute pain of left shoulder  Muscle weakness (generalized)  Localized edema  Abnormal posture  Rationale for Evaluation and Treatment: Rehabilitation  ONSET DATE: 10/09/23 DATE OF SURGERY  SUBJECTIVE:                                                                                                                                                                                     SUBJECTIVE STATEMENT: "Shoulder is getting better. Very little pain when I feel it."   Hand dominance: Right  PERTINENT HISTORY: H/o a-fib, loop recorder, ETOH use, depression  PAIN:  Are you having pain? Yes: NPRS scale: none currently; 2 at worst/10 Pain location: Lt shoulder Pain description: sore Aggravating factors:  reaching Relieving factors: medicine  PRECAUTIONS: Shoulder and Other: reverse total shoulder replacement  to follow protocol for reverse total shoulder Phase IV 12/18/23 to 01/01/24: Standing AROM, 1-3 lbs weights Phase V: No ROM restrictions  WEIGHT BEARING RESTRICTIONS: Yes < 5 lb per protocol until 01/01/24 and then advance to tolerance    FALLS:  Has patient fallen in last 6 months? Yes. Number of falls 2x in the past 6 months. He has a h/o  drinking and felt like gabapentin + alcohol led to falls.    LIVING ENVIRONMENT: Lives with: lives with their spouse Lives in: House/apartment Stairs:  2 to garage and 2 to deck in back  OCCUPATION: Retired from [employer]; enjoys repairing things  PATIENT GOALS:reduced pain and improve use of the arm "as close to normal as I can get"  NEXT MD VISIT:  01/04/24  OBJECTIVE:  Note: Objective measures were completed at Evaluation unless otherwise noted.  PATIENT SURVEYS:  FOTO 44 11/04/23: 44%  11/13/23: 58% function  12/04/23: 59% function  01/05/24: 66% function  01/29/24: 77% function   EDEMA: Pocket of edema in distal medial arm, bruising in proximal biceps bruising and edema noted dressing in place  POSTURE: Rounded shoulders and forward head position  UPPER EXTREMITY ROM:    ROM Left 12/13 11/06/23 Left  11/13/23 Left  11/25/23 Left  11/27/23 Left  12/04/23 Left  12/11/23 Left  01/01/24 01/08/24 Left  01/19/24 Left  01/29/24 Left   Shoulder flexion 75 140 PROM  135 PROM 136 AROM  138 AROM 140 AROM 140 AROM sup 145 AROM supine  122 AROM standing  140 AROM standing   Shoulder scaption           120 AROM standing   Shoulder abduction  110 PROM 112 PROM   95 AROM 97 AROM 105 AROM sidelying 105 AROM supine  80 AROM standing   Shoulder adduction             Shoulder internal rotation             Shoulder external rotation 0 32 PROM  42 PROM 45 PROM 56 PROM 30 AROM 36 AROM 40 AROM sup 45 AROM supine   50 AROM supine   Functional crossover reach            Can reach to Rt anterior shoulder  Elbow extension             Wrist flexion             Wrist extension             Wrist ulnar deviation             Wrist radial deviation             Wrist pronation             Wrist supination             (Blank rows = not tested)  UPPER EXTREMITY MMT:  wrist flexion/extension grip strength 35 L and 60 R UNABLE-- will assess once protocol allows MMT Right eval Left 12/04/23 01/29/24 Left   Shoulder flexion  3- 4- in available range  Shoulder extension     Shoulder abduction  3- 4+ in available range  Shoulder adduction     Shoulder internal rotation  Deferred  4-  Shoulder external rotation  Deferred  4+  Middle trapezius     Lower trapezius     Elbow flexion     Elbow extension     Wrist flexion     Wrist extension     Wrist ulnar deviation     Wrist radial deviation     Wrist pronation     Wrist supination     Grip strength (lbs)     (Blank rows = not tested)  [facility] Adult PT Treatment:  DATE: 02/05/24 Therapeutic Exercise: Crossover stretch 2 x 30 sec   Neuromuscular re-ed: Rows blue band 2 x 10  Resisted ER/IR red band 2 x 10  Resisted shoulder extension green band 2 x 10  Therapeutic Activity: Pulleys abduction x 2 minutes  Standing shoulder flexion 90 degrees 1 lb 2 x 10  Standing shoulder scaption  1 lb 2 x 10  Standing shoulder abduction 2 x 10  Functional IR with towel x 10  Standing shoulder physioball wall walk x 10; 5 sec hold    [facility] Adult PT Treatment:                                                DATE: 01/29/24 Therapeutic Exercise: Crossover stretch 2 x 30 sec  Sidelying shoulder abduction 2# 2 x 10  Updated HEP  Neuromuscular re-ed: Resisted shoulder IR red band 2 x 10  Resisted shoulder adduction red band 2 x 10  Standing resisted shoulder extension red band 2 x 10 Therapeutic Activity: Re-assessment to determine overall progress, educating patient on  progress towards goals.     [facility] Adult PT Treatment:                                                DATE: 01/26/24  Neuromuscular re-ed: Sidelying ER 2 x 10 @ 1 lb Prone row 2 x 10 @ 3 lbs  Prone single arm T 2 x 10  Prone single arm extension 2 x 10  Therapeutic Activity: Pulleys flexion x 2 minutes  Supine shoulder flexion red band 2 x 10  Farmer's carry 1 lap @ 5 lbs, 1 lap @ 10 lbs  Med ball lift from table 15 lbs x 10  Standing shoulder abduction AAROM with dowel x 10     PATIENT EDUCATION: Education details: HEP update  Person educated: Patient  Education method: Explanation, Education comprehension: verbalized understanding,   HOME EXERCISE PROGRAM: Access Code: [email or web address] URL: [email or web address] Date: 02/05/2024 Prepared by: Letitia Libra  Exercises - Standing Shoulder Posterior Capsule Stretch  - 1 x daily - 7 x weekly - 3 sets - 30 sec  hold - Standing Shoulder Internal Rotation Stretch with Towel  - 1 x daily - 7 x weekly - 1 sets - 10 reps - Seated Shoulder Scaption AAROM with Pulley at Side  - 1 x daily - 7 x weekly - 2 sets - 10 reps - Shoulder Flexion Wall Slide with Towel  - 1 x daily - 7 x weekly - 1 sets - 10 reps - 5 sec  hold - Standing Shoulder Abduction Slides at Wall  - 1 x daily - 7 x weekly - 1 sets - 10 reps - 5 sec  hold - Standing Shoulder Internal Rotation Stretch with Towel  - 1 x daily - 7 x weekly - 1 sets - 10 reps - 5 sec  hold - Standing Shoulder Flexion to 90 Degrees  - 1 x daily - 7 x weekly - 3 sets - 5 reps - Shoulder Abduction with Dumbbells - Palms Down  - 1 x daily - 7 x weekly - 3 sets - 5 reps - Shoulder Taps on Table  - 1 x daily - 7  x weekly - 2 sets - 10 reps - Standing Shoulder Row with Anchored Resistance  - 1 x daily - 3 x weekly - 2 sets - 10 reps - Seated Single Arm Bicep Curls with Rotation and Dumbbell  - 1 x daily - 3 x weekly - 2 sets - 10 reps - Shoulder External Rotation and Scapular Retraction with  Resistance  - 1 x daily - 3 x weekly - 2 sets - 10 reps - Supine Shoulder Horizontal Abduction with Resistance  - 1 x daily - 3 x weekly - 2 sets - 10 reps - Shoulder Internal Rotation with Resistance  - 1 x daily - 3 x weekly - 2 sets - 10 reps - Sidelying Shoulder External Rotation  - 1 x daily - 3 x weekly - 2 sets - 10 reps  ASSESSMENT:  CLINICAL IMPRESSION: Continued with Lt shoulder ROM and strength progression in gravity dependent positioning. He is able to lift light resistance through approx. 120 degrees of shoulder flexion with good form and control. Minimal cues required to reduce upper trap engagement with periscapular strengthening. No reports of pan throughout session.   OBJECTIVE IMPAIRMENTS: decreased activity tolerance, decreased ROM, decreased strength, increased edema, increased fascial restrictions, increased muscle spasms, impaired flexibility, impaired tone, postural dysfunction, and pain.     GOALS: Goals reviewed with patient? Yes  SHORT TERM GOALS: Target date: 11/13/23  The patient will be indep with HEP. Baseline: initiated at eval Goal status: MET  2.  The patient will have PROM documented. Baseline:  began <1 week with elbow and wrist ROM + grip and neck ROM Goal status: MET   LONG TERM GOALS: Target date: 03/13/24  The patient will be indep with HEP. Baseline:  initiated at eval Goal status: progressing   2.  The patient will improve ROM to protocol ranges of flexion to 110-120, ER 30-45. Baseline: not assessed at eval 12/04/23: met PROM, progressing AROM per protocol  Goal status: MET   3.  The patient will tolerate isometrics below shoulder level. Baseline:  not assessed at eval Goal status: MET   4.  The patient will reach into shoulder flexion/scaption to 90 degrees to demo improved strength against gravity. Baseline: not assessed at eval. 11/13/23: deferred due to post-op restrictions  12/04/23: flexion to 138 in supine  Goal status: MET     5.  The patient will reduce pain to < or equal to 2/10. Baseline:  5-9/10 12/04/23: 6 at worst  Goal status: MET   6.  Pt will improve FOTO to >= 74 Baseline: see above  Goal status: MET   7. Patient will demonstrate at least 4/5 gross Lt shoulder strength to improve ability to lift and carry items.   Baseline: see MMT chart   Goal status: progressing   8. Patient will be able to reach to lateral scapular border with crossover functional reach with LUE to improve ability to complete bathing activity.    Baseline: see ROM chart above   Goal status: NEW PLAN:  PT FREQUENCY: 1x/week  PT DURATION: 6 weeks  PLANNED INTERVENTIONS: 97164- PT Re-evaluation, 97110-Therapeutic exercises, 97530- Therapeutic activity, 97112- Neuromuscular re-education, 97535- Self Care, 16109- Manual therapy, 97014- Electrical stimulation (unattended), 97016- Vasopneumatic device, Patient/Family education, Taping, Dry Needling, Joint mobilization, Cryotherapy, and Moist heat  PLAN FOR NEXT SESSION: progress LUE ROM and strengthening as tolerated   Letitia Libra, PT, DPT, ATC 02/05/24 11:44 AM

## 2024-02-11 ENCOUNTER — Ambulatory Visit

## 2024-02-11 DIAGNOSIS — R600 Localized edema: Secondary | ICD-10-CM | POA: Diagnosis not present

## 2024-02-11 DIAGNOSIS — M25512 Pain in left shoulder: Secondary | ICD-10-CM

## 2024-02-11 DIAGNOSIS — M6281 Muscle weakness (generalized): Secondary | ICD-10-CM

## 2024-02-11 DIAGNOSIS — R293 Abnormal posture: Secondary | ICD-10-CM | POA: Diagnosis not present

## 2024-02-11 NOTE — Therapy (Signed)
 OUTPATIENT PHYSICAL THERAPY SHOULDER TREATMENT     Patient Name: Joshua [name] [facility] MRN: [id number] DOB:06/25/1946, 78 y.o., male Today's Date: 02/11/2024  END OF SESSION:  PT End of Session - 02/11/24 0846     Visit Number 30    Number of Visits 34    Date for PT Re-Evaluation 03/13/24    Authorization Type [employer] medicare    Authorization Time Period 4/3-5/10    Authorization - Visit Number 2    Authorization - Number of Visits 8    Progress Note Due on Visit 38    PT Start Time [phone]    PT Stop Time 0930    PT Time Calculation (min) 44 min    Activity Tolerance Patient tolerated treatment well    Behavior During Therapy [facility] for tasks assessed/performed                       Past Medical History:  Diagnosis Date   Abnormal findings on diagnostic imaging of lung 09/23/2019   09/22/2019-lung cancer screening-centrilobular and paraseptal emphysema, calcified granulomata noted bilaterally, no suspicious nodules, lung RADS 1, repeat in 12 months    Anxiety    Atrial fibrillation (HCC) 09/23/2019   Centrilobular emphysema (HCC) 09/23/2019   09/22/2019-lung cancer screening-centrilobular and paraseptal emphysema, calcified granulomata noted bilaterally, no suspicious nodules, lung RADS 1, repeat in 12 months  09/23/2019-FVC 3.74 (81% predicted), postbronchodilator ratio 80, postbronchodilator FEV1 2.88 (86% predicted), no bronchodilator response, DLCO 19.44 (73% predicted)   DDD (degenerative disc disease), cervical 05/13/2008   Cervical Disc Degeneration   Depression    ETOH abuse 10/01/2023   continues to drink 1-3 drinks/ day   History of adenomatous polyp of colon 03/03/2018   05/14/17: Colonoscopy: nonadvanced adenoma, f/u 5 yrs, use SuPrep, Murphy/GAP  Last Assessment & Plan:  Relevant Hx: Course: Daily Update: Today's Plan:   History of kidney stones    Hyperlipidemia    Kidney stone    Nephrolithiasis 05/22/2011   Nontraumatic incomplete tear of right  rotator cuff 10/14/2018   Other and unspecified hyperlipidemia 11/20/2009   Hyperlipidemia   RBBB 06/09/2012   Secondary hypercoagulable state (HCC) 09/28/2019   Shortness of breath 09/23/2019   Past Surgical History:  Procedure Laterality Date   CARDIOVERSION N/A 10/15/2019   Procedure: CARDIOVERSION;  Surgeon: Jake Bathe, MD;  Location: [facility] ENDOSCOPY;  Service: Cardiovascular;  Laterality: N/A;   CARDIOVERSION N/A 11/29/2019   Procedure: CARDIOVERSION;  Surgeon: Lars Masson, MD;  Location: [facility] ENDOSCOPY;  Service: Cardiovascular;  Laterality: N/A;   HEMORRHOID SURGERY     REVERSE SHOULDER ARTHROPLASTY Left 10/09/2023   Procedure: REVERSE SHOULDER ARTHROPLASTY;  Surgeon: Bjorn Pippin, MD;  Location: [facility];  Service: Orthopedics;  Laterality: Left;   TOTAL HIP ARTHROPLASTY Right 12/21/2021   Procedure: TOTAL HIP ARTHROPLASTY ANTERIOR APPROACH;  Surgeon: Jodi Geralds, MD;  Location: [facility] ORS;  Service: Orthopedics;  Laterality: Right;   VARICOSE VEIN SURGERY Right    Patient Active Problem List   Diagnosis Date Noted   Alcohol-induced mood disorder with depressive symptoms (HCC) 06/10/2023   Benign prostatic hyperplasia without lower urinary tract symptoms 06/05/2023   Dyslipidemia 06/05/2023   Fall 06/05/2023   History of peripheral neuropathy 06/05/2023   Subdural hematoma (HCC) 06/04/2023   Pain in finger of left hand 03/12/2023   Alcohol abuse 05/31/2022   Disorder of thoracic spine 05/31/2022   COVID-19 virus infection 03/04/2022   Acute bronchitis 03/04/2022  OSA (obstructive sleep apnea) 03/04/2022   PAF (paroxysmal atrial fibrillation) (HCC) 12/13/2021   Anxiety 11/08/2021   Aphasia 11/08/2021   Bipolar 1 disorder (HCC) 11/08/2021   CKD (chronic kidney disease), stage III (HCC) 11/08/2021   Expressive aphasia 11/08/2021   Prolonged QT interval 11/08/2021   Recurrent falls 11/08/2021   Primary osteoarthritis of right hip 10/26/2021    Abnormal liver function tests 09/10/2021   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   Urinary hesitancy 03/08/2021   Thoracic aortic aneurysm without rupture (HCC) 03/08/2021   Senile purpura (HCC) 09/07/2020   Aortic atherosclerosis (HCC) 09/07/2020   Sinus bradycardia 03/31/2020   Essential hypertension 03/31/2020   Major depressive disorder 03/07/2020   Essential tremor 03/07/2020   Tremor 01/21/2020   Obesity (BMI 30-39.9) 12/10/2019   Chest pain of uncertain etiology 12/10/2019   Secondary hypercoagulable state (HCC) 09/28/2019   Centrilobular emphysema (HCC) 09/23/2019   Former smoker 09/23/2019   Abnormal findings on diagnostic imaging of lung 09/23/2019   Atrial fibrillation (HCC) 09/23/2019   At risk for sleep apnea 09/23/2019   Shortness of breath 09/23/2019   Nontraumatic incomplete tear of right rotator cuff 10/14/2018   History of adenomatous polyp of colon 03/03/2018   RBBB 06/09/2012   Nephrolithiasis 05/22/2011   Mixed hyperlipidemia 11/20/2009   DDD (degenerative disc disease), cervical 05/13/2008    PCP: Everrett Coombe, DO REFERRING PROVIDER: Ramond Marrow, MD REFERRING DIAG: [id number] (ICD-10-CM) - Status post reverse total arthroplasty of left shoulder  THERAPY DIAG:  Acute pain of left shoulder  Muscle weakness (generalized)  Localized edema  Abnormal posture  Rationale for Evaluation and Treatment: Rehabilitation  ONSET DATE: 10/09/23 DATE OF SURGERY  SUBJECTIVE:                                                                                                                                                                                     SUBJECTIVE STATEMENT: "It's been hurting a little bit more for the past 2 days."   Hand dominance: Right  PERTINENT HISTORY: H/o a-fib, loop recorder, ETOH use, depression  PAIN:  Are you having pain? Yes: NPRS scale: 2/10 Pain location: Lt shoulder Pain description: sore Aggravating factors: reaching Relieving factors:  medicine  PRECAUTIONS: Shoulder and Other: reverse total shoulder replacement  to follow protocol for reverse total shoulder Phase IV 12/18/23 to 01/01/24: Standing AROM, 1-3 lbs weights Phase V: No ROM restrictions  WEIGHT BEARING RESTRICTIONS: Yes < 5 lb per protocol until 01/01/24 and then advance to tolerance    FALLS:  Has patient fallen in last 6 months? Yes. Number of falls 2x in the past 6 months. He has a h/o drinking and felt  like gabapentin + alcohol led to falls.    LIVING ENVIRONMENT: Lives with: lives with their spouse Lives in: House/apartment Stairs:  2 to garage and 2 to deck in back  OCCUPATION: Retired from [employer]; enjoys repairing things  PATIENT GOALS:reduced pain and improve use of the arm "as close to normal as I can get"  NEXT MD VISIT:  01/04/24  OBJECTIVE:  Note: Objective measures were completed at Evaluation unless otherwise noted.  PATIENT SURVEYS:  FOTO 44 11/04/23: 44%  11/13/23: 58% function  12/04/23: 59% function  01/05/24: 66% function  01/29/24: 77% function   EDEMA: Pocket of edema in distal medial arm, bruising in proximal biceps bruising and edema noted dressing in place  POSTURE: Rounded shoulders and forward head position  UPPER EXTREMITY ROM:    ROM Left 12/13 11/06/23 Left  11/13/23 Left  11/25/23 Left  11/27/23 Left  12/04/23 Left  12/11/23 Left  01/01/24 01/08/24 Left  01/19/24 Left  01/29/24 Left   Shoulder flexion 75 140 PROM  135 PROM 136 AROM  138 AROM 140 AROM 140 AROM sup 145 AROM supine  122 AROM standing  140 AROM standing   Shoulder scaption           120 AROM standing   Shoulder abduction  110 PROM 112 PROM   95 AROM 97 AROM 105 AROM sidelying 105 AROM supine  80 AROM standing   Shoulder adduction             Shoulder internal rotation             Shoulder external rotation 0 32 PROM  42 PROM 45 PROM 56 PROM 30 AROM 36 AROM 40 AROM sup 45 AROM supine   50 AROM supine   Functional crossover reach           Can reach to Rt anterior  shoulder  Elbow extension             Wrist flexion             Wrist extension             Wrist ulnar deviation             Wrist radial deviation             Wrist pronation             Wrist supination             (Blank rows = not tested)  UPPER EXTREMITY MMT:  wrist flexion/extension grip strength 35 L and 60 R UNABLE-- will assess once protocol allows MMT Right eval Left 12/04/23 01/29/24 Left   Shoulder flexion  3- 4- in available range  Shoulder extension     Shoulder abduction  3- 4+ in available range  Shoulder adduction     Shoulder internal rotation  Deferred  4-  Shoulder external rotation  Deferred  4+  Middle trapezius     Lower trapezius     Elbow flexion     Elbow extension     Wrist flexion     Wrist extension     Wrist ulnar deviation     Wrist radial deviation     Wrist pronation     Wrist supination     Grip strength (lbs)     (Blank rows = not tested) [facility] Adult PT Treatment:  DATE: 02/11/24  Manual Therapy: Lt shoulder PROM flexion, ER, IR, abduction with gentle stretching at end range Neuromuscular re-ed: Sidelying ER 2 x 10 @ 2 lbs  Resisted rows at matrix 2  x 10 @ 10 lbs  Therapeutic Activity: Finger ladder flexion, abduction x 5 each   Shoulder extension AAROM with dowel x 10  Standing shoulder abduction 1 lb x 10  Curl to overhead press with dowel 2 x 10    [facility] Adult PT Treatment:                                                DATE: 02/05/24 Therapeutic Exercise: Crossover stretch 2 x 30 sec   Neuromuscular re-ed: Rows blue band 2 x 10  Resisted ER/IR red band 2 x 10  Resisted shoulder extension green band 2 x 10  Therapeutic Activity: Pulleys abduction x 2 minutes  Standing shoulder flexion 90 degrees 1 lb 2 x 10  Standing shoulder scaption  1 lb 2 x 10  Standing shoulder abduction 2 x 10  Functional IR with towel x 10  Standing shoulder physioball wall walk x 10; 5 sec hold     [facility] Adult PT Treatment:                                                DATE: 01/29/24 Therapeutic Exercise: Crossover stretch 2 x 30 sec  Sidelying shoulder abduction 2# 2 x 10  Updated HEP  Neuromuscular re-ed: Resisted shoulder IR red band 2 x 10  Resisted shoulder adduction red band 2 x 10  Standing resisted shoulder extension red band 2 x 10 Therapeutic Activity: Re-assessment to determine overall progress, educating patient on progress towards goals.      PATIENT EDUCATION: Education details: HEP review  Person educated: Patient  Education method: Programmer, multimedia, Education comprehension: verbalized understanding,   HOME EXERCISE PROGRAM: Access Code: [email or web address] URL: [email or web address] Date: 02/05/2024 Prepared by: Letitia Libra  Exercises - Standing Shoulder Posterior Capsule Stretch  - 1 x daily - 7 x weekly - 3 sets - 30 sec  hold - Standing Shoulder Internal Rotation Stretch with Towel  - 1 x daily - 7 x weekly - 1 sets - 10 reps - Seated Shoulder Scaption AAROM with Pulley at Side  - 1 x daily - 7 x weekly - 2 sets - 10 reps - Shoulder Flexion Wall Slide with Towel  - 1 x daily - 7 x weekly - 1 sets - 10 reps - 5 sec  hold - Standing Shoulder Abduction Slides at Wall  - 1 x daily - 7 x weekly - 1 sets - 10 reps - 5 sec  hold - Standing Shoulder Internal Rotation Stretch with Towel  - 1 x daily - 7 x weekly - 1 sets - 10 reps - 5 sec  hold - Standing Shoulder Flexion to 90 Degrees  - 1 x daily - 7 x weekly - 3 sets - 5 reps - Shoulder Abduction with Dumbbells - Palms Down  - 1 x daily - 7 x weekly - 3 sets - 5 reps - Shoulder Taps on Table  - 1 x daily - 7 x weekly - 2 sets - 10 reps -  Standing Shoulder Row with Anchored Resistance  - 1 x daily - 3 x weekly - 2 sets - 10 reps - Seated Single Arm Bicep Curls with Rotation and Dumbbell  - 1 x daily - 3 x weekly - 2 sets - 10 reps - Shoulder External Rotation and Scapular Retraction with Resistance  - 1  x daily - 3 x weekly - 2 sets - 10 reps - Supine Shoulder Horizontal Abduction with Resistance  - 1 x daily - 3 x weekly - 2 sets - 10 reps - Shoulder Internal Rotation with Resistance  - 1 x daily - 3 x weekly - 2 sets - 10 reps - Sidelying Shoulder External Rotation  - 1 x daily - 3 x weekly - 2 sets - 10 reps  ASSESSMENT:  CLINICAL IMPRESSION: Patient arrives with mild Lt shoulder pain. He reported improvement in his pain following mobility and manual work at beginning of session. Able to initiate light resisted abduction with patient able to complete through partial range with good form. With overhead press he is able to maintain proper form with the LUE, but exhibits limited range compared to RUE.   OBJECTIVE IMPAIRMENTS: decreased activity tolerance, decreased ROM, decreased strength, increased edema, increased fascial restrictions, increased muscle spasms, impaired flexibility, impaired tone, postural dysfunction, and pain.     GOALS: Goals reviewed with patient? Yes  SHORT TERM GOALS: Target date: 11/13/23  The patient will be indep with HEP. Baseline: initiated at eval Goal status: MET  2.  The patient will have PROM documented. Baseline:  began <1 week with elbow and wrist ROM + grip and neck ROM Goal status: MET   LONG TERM GOALS: Target date: 03/13/24  The patient will be indep with HEP. Baseline:  initiated at eval Goal status: progressing   2.  The patient will improve ROM to protocol ranges of flexion to 110-120, ER 30-45. Baseline: not assessed at eval 12/04/23: met PROM, progressing AROM per protocol  Goal status: MET   3.  The patient will tolerate isometrics below shoulder level. Baseline:  not assessed at eval Goal status: MET   4.  The patient will reach into shoulder flexion/scaption to 90 degrees to demo improved strength against gravity. Baseline: not assessed at eval. 11/13/23: deferred due to post-op restrictions  12/04/23: flexion to 138 in supine   Goal status: MET    5.  The patient will reduce pain to < or equal to 2/10. Baseline:  5-9/10 12/04/23: 6 at worst  Goal status: MET   6.  Pt will improve FOTO to >= 74 Baseline: see above  Goal status: MET   7. Patient will demonstrate at least 4/5 gross Lt shoulder strength to improve ability to lift and carry items.   Baseline: see MMT chart   Goal status: progressing   8. Patient will be able to reach to lateral scapular border with crossover functional reach with LUE to improve ability to complete bathing activity.    Baseline: see ROM chart above   Goal status: NEW PLAN:  PT FREQUENCY: 1x/week  PT DURATION: 6 weeks  PLANNED INTERVENTIONS: 97164- PT Re-evaluation, 97110-Therapeutic exercises, 97530- Therapeutic activity, 97112- Neuromuscular re-education, 97535- Self Care, [id number]- Manual therapy, 97014- Electrical stimulation (unattended), 97016- Vasopneumatic device, Patient/Family education, Taping, Dry Needling, Joint mobilization, Cryotherapy, and Moist heat  PLAN FOR NEXT SESSION: progress LUE ROM and strengthening as tolerated   Letitia Libra, PT, DPT, ATC 02/11/24 9:31 AM

## 2024-02-18 ENCOUNTER — Ambulatory Visit

## 2024-02-18 DIAGNOSIS — N401 Enlarged prostate with lower urinary tract symptoms: Secondary | ICD-10-CM | POA: Diagnosis not present

## 2024-02-18 DIAGNOSIS — R600 Localized edema: Secondary | ICD-10-CM

## 2024-02-18 DIAGNOSIS — R293 Abnormal posture: Secondary | ICD-10-CM

## 2024-02-18 DIAGNOSIS — M6281 Muscle weakness (generalized): Secondary | ICD-10-CM | POA: Diagnosis not present

## 2024-02-18 DIAGNOSIS — N3943 Post-void dribbling: Secondary | ICD-10-CM | POA: Diagnosis not present

## 2024-02-18 DIAGNOSIS — M25512 Pain in left shoulder: Secondary | ICD-10-CM

## 2024-02-18 DIAGNOSIS — R3912 Poor urinary stream: Secondary | ICD-10-CM | POA: Diagnosis not present

## 2024-02-18 DIAGNOSIS — R3911 Hesitancy of micturition: Secondary | ICD-10-CM | POA: Diagnosis not present

## 2024-02-18 NOTE — Therapy (Signed)
 OUTPATIENT PHYSICAL THERAPY SHOULDER TREATMENT     Patient Name: [name] MRN: [id number] DOB:[birth year], 78 y.o., male Today's Date: 02/18/2024  END OF SESSION:  PT End of Session - 02/18/24 0849     Visit Number 31    Number of Visits 34    Date for PT Re-Evaluation 03/13/24    Authorization Type [employer] medicare    Authorization Time Period 4/3-5/10    Authorization - Visit Number 3    Authorization - Number of Visits 8    Progress Note Due on Visit 38    PT Start Time 0849    PT Stop Time 0929    PT Time Calculation (min) 40 min    Activity Tolerance Patient tolerated treatment well    Behavior During Therapy [facility] for tasks assessed/performed                        Past Medical History:  Diagnosis Date   Abnormal findings on diagnostic imaging of lung 09/23/2019   09/22/2019-lung cancer screening-centrilobular and paraseptal emphysema, calcified granulomata noted bilaterally, no suspicious nodules, lung RADS 1, repeat in 12 months    Anxiety    Atrial fibrillation (HCC) 09/23/2019   Centrilobular emphysema (HCC) 09/23/2019   09/22/2019-lung cancer screening-centrilobular and paraseptal emphysema, calcified granulomata noted bilaterally, no suspicious nodules, lung RADS 1, repeat in 12 months  09/23/2019-FVC 3.74 (81% predicted), postbronchodilator ratio 80, postbronchodilator FEV1 2.88 (86% predicted), no bronchodilator response, DLCO 19.44 (73% predicted)   DDD (degenerative disc disease), cervical 05/13/2008   Cervical Disc Degeneration   Depression    ETOH abuse 10/01/2023   continues to drink 1-3 drinks/ day   History of adenomatous polyp of colon 03/03/2018   05/14/17: Colonoscopy: nonadvanced adenoma, f/u 5 yrs, use SuPrep, Murphy/GAP  Last Assessment & Plan:  Relevant Hx: Course: Daily Update: Today's Plan:   History of kidney stones    Hyperlipidemia    Kidney stone    Nephrolithiasis 05/22/2011   Nontraumatic incomplete tear of right  rotator cuff 10/14/2018   Other and unspecified hyperlipidemia 11/20/2009   Hyperlipidemia   RBBB 06/09/2012   Secondary hypercoagulable state (HCC) 09/28/2019   Shortness of breath 09/23/2019   Past Surgical History:  Procedure Laterality Date   CARDIOVERSION N/A 10/15/2019   Procedure: CARDIOVERSION;  Surgeon: Hugh Madura, MD;  Location: [facility] ENDOSCOPY;  Service: Cardiovascular;  Laterality: N/A;   CARDIOVERSION N/A 11/29/2019   Procedure: CARDIOVERSION;  Surgeon: Liza Riggers, MD;  Location: [facility] ENDOSCOPY;  Service: Cardiovascular;  Laterality: N/A;   HEMORRHOID SURGERY     REVERSE SHOULDER ARTHROPLASTY Left 10/09/2023   Procedure: REVERSE SHOULDER ARTHROPLASTY;  Surgeon: Micheline Ahr, MD;  Location: [facility];  Service: Orthopedics;  Laterality: Left;   TOTAL HIP ARTHROPLASTY Right 12/21/2021   Procedure: TOTAL HIP ARTHROPLASTY ANTERIOR APPROACH;  Surgeon: Neil Balls, MD;  Location: [facility] ORS;  Service: Orthopedics;  Laterality: Right;   VARICOSE VEIN SURGERY Right    Patient Active Problem List   Diagnosis Date Noted   Alcohol-induced mood disorder with depressive symptoms (HCC) 06/10/2023   Benign prostatic hyperplasia without lower urinary tract symptoms 06/05/2023   Dyslipidemia 06/05/2023   Fall 06/05/2023   History of peripheral neuropathy 06/05/2023   Subdural hematoma (HCC) 06/04/2023   Pain in finger of left hand 03/12/2023   Alcohol abuse 05/31/2022   Disorder of thoracic spine 05/31/2022   COVID-19 virus infection 03/04/2022   Acute bronchitis 03/04/2022  OSA (obstructive sleep apnea) 03/04/2022   PAF (paroxysmal atrial fibrillation) (HCC) 12/13/2021   Anxiety 11/08/2021   Aphasia 11/08/2021   Bipolar 1 disorder (HCC) 11/08/2021   CKD (chronic kidney disease), stage III (HCC) 11/08/2021   Expressive aphasia 11/08/2021   Prolonged QT interval 11/08/2021   Recurrent falls 11/08/2021   Primary osteoarthritis of right hip 10/26/2021    Abnormal liver function tests 09/10/2021   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   Urinary hesitancy 03/08/2021   Thoracic aortic aneurysm without rupture (HCC) 03/08/2021   Senile purpura (HCC) 09/07/2020   Aortic atherosclerosis (HCC) 09/07/2020   Sinus bradycardia 03/31/2020   Essential hypertension 03/31/2020   Major depressive disorder 03/07/2020   Essential tremor 03/07/2020   Tremor 01/21/2020   Obesity (BMI 30-39.9) 12/10/2019   Chest pain of uncertain etiology 12/10/2019   Secondary hypercoagulable state (HCC) 09/28/2019   Centrilobular emphysema (HCC) 09/23/2019   Former smoker 09/23/2019   Abnormal findings on diagnostic imaging of lung 09/23/2019   Atrial fibrillation (HCC) 09/23/2019   At risk for sleep apnea 09/23/2019   Shortness of breath 09/23/2019   Nontraumatic incomplete tear of right rotator cuff 10/14/2018   History of adenomatous polyp of colon 03/03/2018   RBBB 06/09/2012   Nephrolithiasis 05/22/2011   Mixed hyperlipidemia 11/20/2009   DDD (degenerative disc disease), cervical 05/13/2008    PCP: Adela Holter, DO REFERRING PROVIDER: Grafton Lawrence, MD REFERRING DIAG: [phone] (ICD-10-CM) - Status post reverse total arthroplasty of left shoulder  THERAPY DIAG:  Acute pain of left shoulder  Muscle weakness (generalized)  Localized edema  Abnormal posture  Rationale for Evaluation and Treatment: Rehabilitation  ONSET DATE: 10/09/23 DATE OF SURGERY  SUBJECTIVE:                                                                                                                                                                                     SUBJECTIVE STATEMENT: "Feeling good right now. Haven't thought about it much the past couple days."  Hand dominance: Right  PERTINENT HISTORY: H/o a-fib, loop recorder, ETOH use, depression  PAIN:  Are you having pain? No  PRECAUTIONS: Shoulder and Other: reverse total shoulder replacement  to follow protocol for reverse total  shoulder Phase IV 12/18/23 to 01/01/24: Standing AROM, 1-3 lbs weights Phase V: No ROM restrictions  WEIGHT BEARING RESTRICTIONS: Yes < 5 lb per protocol until 01/01/24 and then advance to tolerance    FALLS:  Has patient fallen in last 6 months? Yes. Number of falls 2x in the past 6 months. He has a h/o drinking and felt like gabapentin + alcohol led to falls.    LIVING ENVIRONMENT: Lives with: lives with  their spouse Lives in: House/apartment Stairs:  2 to garage and 2 to deck in back  OCCUPATION: Retired from [employer]; enjoys repairing things  PATIENT GOALS:reduced pain and improve use of the arm "as close to normal as I can get"  NEXT MD VISIT:  01/04/24  OBJECTIVE:  Note: Objective measures were completed at Evaluation unless otherwise noted.  PATIENT SURVEYS:  FOTO 44 11/04/23: 44%  11/13/23: 58% function  12/04/23: 59% function  01/05/24: 66% function  01/29/24: 77% function   EDEMA: Pocket of edema in distal medial arm, bruising in proximal biceps bruising and edema noted dressing in place  POSTURE: Rounded shoulders and forward head position  UPPER EXTREMITY ROM:    ROM Left 12/13 11/06/23 Left  11/13/23 Left  11/25/23 Left  11/27/23 Left  12/04/23 Left  12/11/23 Left  01/01/24 01/08/24 Left  01/19/24 Left  01/29/24 Left   Shoulder flexion 75 140 PROM  135 PROM 136 AROM  138 AROM 140 AROM 140 AROM sup 145 AROM supine  122 AROM standing  140 AROM standing   Shoulder scaption           120 AROM standing   Shoulder abduction  110 PROM 112 PROM   95 AROM 97 AROM 105 AROM sidelying 105 AROM supine  80 AROM standing   Shoulder adduction             Shoulder internal rotation             Shoulder external rotation 0 32 PROM  42 PROM 45 PROM 56 PROM 30 AROM 36 AROM 40 AROM sup 45 AROM supine   50 AROM supine   Functional crossover reach           Can reach to Rt anterior shoulder  Elbow extension             Wrist flexion             Wrist extension             Wrist ulnar deviation              Wrist radial deviation             Wrist pronation             Wrist supination             (Blank rows = not tested)  UPPER EXTREMITY MMT:  wrist flexion/extension grip strength 35 L and 60 R UNABLE-- will assess once protocol allows MMT Right eval Left 12/04/23 01/29/24 Left  02/18/24 Left   Shoulder flexion  3- 4- in available range   Shoulder extension      Shoulder abduction  3- 4+ in available range   Shoulder adduction      Shoulder internal rotation  Deferred  4- 4  Shoulder external rotation  Deferred  4+ 5  Middle trapezius      Lower trapezius      Elbow flexion      Elbow extension      Wrist flexion      Wrist extension      Wrist ulnar deviation      Wrist radial deviation      Wrist pronation      Wrist supination      Grip strength (lbs)      (Blank rows = not tested) [facility] Adult PT Treatment:  DATE: 02/18/24 Therapeutic Exercise: Sidelying shoulder abduction 2 x 10 @ 2 lbs  5 lb kettleball behind the back pass through 2 x 10 CW/CCW  Rows matrix 2 x 10 @ 10 lbs   Therapeutic Activity: Finger ladder flexion, abduction x 5 each  Overhead lifting 2 lb weight from counter to overhead cabinet 2 x 10  Lifting and carrying 15 lb box 4 x 25 ft  Sled pushing 4 x 20 ft  Farmer's carry 5 lb kettlebell 2 laps in clinic  Functional IR stretch with strap x 10; 5 sec hold     [facility] Adult PT Treatment:                                                DATE: 02/11/24  Manual Therapy: Lt shoulder PROM flexion, ER, IR, abduction with gentle stretching at end range Neuromuscular re-ed: Sidelying ER 2 x 10 @ 2 lbs  Resisted rows at matrix 2  x 10 @ 10 lbs  Therapeutic Activity: Finger ladder flexion, abduction x 5 each   Shoulder extension AAROM with dowel x 10  Standing shoulder abduction 1 lb x 10  Curl to overhead press with dowel 2 x 10    [facility] Adult PT Treatment:                                                 DATE: 02/05/24 Therapeutic Exercise: Crossover stretch 2 x 30 sec   Neuromuscular re-ed: Rows blue band 2 x 10  Resisted ER/IR red band 2 x 10  Resisted shoulder extension green band 2 x 10  Therapeutic Activity: Pulleys abduction x 2 minutes  Standing shoulder flexion 90 degrees 1 lb 2 x 10  Standing shoulder scaption  1 lb 2 x 10  Standing shoulder abduction 2 x 10  Functional IR with towel x 10  Standing shoulder physioball wall walk x 10; 5 sec hold      PATIENT EDUCATION: Education details: HEP review  Person educated: Patient  Education method: Programmer, multimedia, Education comprehension: verbalized understanding,   HOME EXERCISE PROGRAM: Access Code: [email or web address] URL: [email or web address] Date: 02/05/2024 Prepared by: Letitia Libra  Exercises - Standing Shoulder Posterior Capsule Stretch  - 1 x daily - 7 x weekly - 3 sets - 30 sec  hold - Standing Shoulder Internal Rotation Stretch with Towel  - 1 x daily - 7 x weekly - 1 sets - 10 reps - Seated Shoulder Scaption AAROM with Pulley at Side  - 1 x daily - 7 x weekly - 2 sets - 10 reps - Shoulder Flexion Wall Slide with Towel  - 1 x daily - 7 x weekly - 1 sets - 10 reps - 5 sec  hold - Standing Shoulder Abduction Slides at Wall  - 1 x daily - 7 x weekly - 1 sets - 10 reps - 5 sec  hold - Standing Shoulder Internal Rotation Stretch with Towel  - 1 x daily - 7 x weekly - 1 sets - 10 reps - 5 sec  hold - Standing Shoulder Flexion to 90 Degrees  - 1 x daily - 7 x weekly - 3 sets - 5 reps - Shoulder Abduction with Dumbbells - Palms Down  -  1 x daily - 7 x weekly - 3 sets - 5 reps - Shoulder Taps on Table  - 1 x daily - 7 x weekly - 2 sets - 10 reps - Standing Shoulder Row with Anchored Resistance  - 1 x daily - 3 x weekly - 2 sets - 10 reps - Seated Single Arm Bicep Curls with Rotation and Dumbbell  - 1 x daily - 3 x weekly - 2 sets - 10 reps - Shoulder External Rotation and Scapular Retraction with Resistance  - 1 x daily  - 3 x weekly - 2 sets - 10 reps - Supine Shoulder Horizontal Abduction with Resistance  - 1 x daily - 3 x weekly - 2 sets - 10 reps - Shoulder Internal Rotation with Resistance  - 1 x daily - 3 x weekly - 2 sets - 10 reps - Sidelying Shoulder External Rotation  - 1 x daily - 3 x weekly - 2 sets - 10 reps  ASSESSMENT:  CLINICAL IMPRESSION: Patient arrives without reports of shoulder pain. Focused on functional strength progression of the shoulder with good tolerance. Patient able to lift and carry 15 lbs with proper form and equal UE weight without onset of shoulder pain. Introduced pushing activity without patient able to push sled without onset of pain. Rotator cuff strength has improved compared to previous assessment with IR remaining slightly deficient.   OBJECTIVE IMPAIRMENTS: decreased activity tolerance, decreased ROM, decreased strength, increased edema, increased fascial restrictions, increased muscle spasms, impaired flexibility, impaired tone, postural dysfunction, and pain.     GOALS: Goals reviewed with patient? Yes  SHORT TERM GOALS: Target date: 11/13/23  The patient will be indep with HEP. Baseline: initiated at eval Goal status: MET  2.  The patient will have PROM documented. Baseline:  began <1 week with elbow and wrist ROM + grip and neck ROM Goal status: MET   LONG TERM GOALS: Target date: 03/13/24  The patient will be indep with HEP. Baseline:  initiated at eval Goal status: progressing   2.  The patient will improve ROM to protocol ranges of flexion to 110-120, ER 30-45. Baseline: not assessed at eval 12/04/23: met PROM, progressing AROM per protocol  Goal status: MET   3.  The patient will tolerate isometrics below shoulder level. Baseline:  not assessed at eval Goal status: MET   4.  The patient will reach into shoulder flexion/scaption to 90 degrees to demo improved strength against gravity. Baseline: not assessed at eval. 11/13/23: deferred due to  post-op restrictions  12/04/23: flexion to 138 in supine  Goal status: MET    5.  The patient will reduce pain to < or equal to 2/10. Baseline:  5-9/10 12/04/23: 6 at worst  Goal status: MET   6.  Pt will improve FOTO to >= 74 Baseline: see above  Goal status: MET   7. Patient will demonstrate at least 4/5 gross Lt shoulder strength to improve ability to lift and carry items.   Baseline: see MMT chart   Goal status: progressing   8. Patient will be able to reach to lateral scapular border with crossover functional reach with LUE to improve ability to complete bathing activity.    Baseline: see ROM chart above   Goal status: NEW PLAN:  PT FREQUENCY: 1x/week  PT DURATION: 6 weeks  PLANNED INTERVENTIONS: 97164- PT Re-evaluation, 97110-Therapeutic exercises, 97530- Therapeutic activity, 97112- Neuromuscular re-education, 97535- Self Care, 40102- Manual therapy, 97014- Electrical stimulation (unattended), 97016- Vasopneumatic device, Patient/Family  education, Taping, Dry Needling, Joint mobilization, Cryotherapy, and Moist heat  PLAN FOR NEXT SESSION: progress LUE ROM and strengthening as tolerated   Kristyl Athens, PT, DPT, ATC 02/18/24 9:29 AM

## 2024-02-25 ENCOUNTER — Ambulatory Visit

## 2024-02-25 DIAGNOSIS — M6281 Muscle weakness (generalized): Secondary | ICD-10-CM

## 2024-02-25 DIAGNOSIS — R600 Localized edema: Secondary | ICD-10-CM

## 2024-02-25 DIAGNOSIS — R293 Abnormal posture: Secondary | ICD-10-CM | POA: Diagnosis not present

## 2024-02-25 DIAGNOSIS — M25512 Pain in left shoulder: Secondary | ICD-10-CM

## 2024-02-25 NOTE — Therapy (Signed)
 OUTPATIENT PHYSICAL THERAPY SHOULDER TREATMENT     Patient Name: Joshua [name] MRN: [id number] DOB:21-Aug-1946, 78 y.o., male Today's Date: 02/25/2024  END OF SESSION:  PT End of Session - 02/25/24 0845     Visit Number 32    Number of Visits 34    Date for PT Re-Evaluation 03/13/24    Authorization Type [employer] medicare    Authorization Time Period 4/3-5/10    Authorization - Visit Number 4    Authorization - Number of Visits 8    Progress Note Due on Visit 38    PT Start Time 0845    PT Stop Time 0925    PT Time Calculation (min) 40 min    Activity Tolerance Patient tolerated treatment well    Behavior During Therapy [facility] for tasks assessed/performed                         Past Medical History:  Diagnosis Date   Abnormal findings on diagnostic imaging of lung 09/23/2019   09/22/2019-lung cancer screening-centrilobular and paraseptal emphysema, calcified granulomata noted bilaterally, no suspicious nodules, lung RADS 1, repeat in 12 months    Anxiety    Atrial fibrillation (HCC) 09/23/2019   Centrilobular emphysema (HCC) 09/23/2019   09/22/2019-lung cancer screening-centrilobular and paraseptal emphysema, calcified granulomata noted bilaterally, no suspicious nodules, lung RADS 1, repeat in 12 months  09/23/2019-FVC 3.74 (81% predicted), postbronchodilator ratio 80, postbronchodilator FEV1 2.88 (86% predicted), no bronchodilator response, DLCO 19.44 (73% predicted)   DDD (degenerative disc disease), cervical 05/13/2008   Cervical Disc Degeneration   Depression    ETOH abuse 10/01/2023   continues to drink 1-3 drinks/ day   History of adenomatous polyp of colon 03/03/2018   05/14/17: Colonoscopy: nonadvanced adenoma, f/u 5 yrs, use SuPrep, Murphy/GAP  Last Assessment & Plan:  Relevant Hx: Course: Daily Update: Today's Plan:   History of kidney stones    Hyperlipidemia    Kidney stone    Nephrolithiasis 05/22/2011   Nontraumatic incomplete tear of  right rotator cuff 10/14/2018   Other and unspecified hyperlipidemia 11/20/2009   Hyperlipidemia   RBBB 06/09/2012   Secondary hypercoagulable state (HCC) 09/28/2019   Shortness of breath 09/23/2019   Past Surgical History:  Procedure Laterality Date   CARDIOVERSION N/A 10/15/2019   Procedure: CARDIOVERSION;  Surgeon: Hugh Madura, MD;  Location: [name];  Service: Cardiovascular;  Laterality: N/A;   CARDIOVERSION N/A 11/29/2019   Procedure: CARDIOVERSION;  Surgeon: Liza Riggers, MD;  Location: [facility] END[facility];  Service: Cardiovascular;  Laterality: N/A;   HEMORRHOID SURGERY     REVERSE SHOULDER ARTHROPLASTY Left 10/09/2023   Procedure: REVERSE SHOULDER ARTHROPLASTY;  Surgeon: Micheline Ahr, MD;  Location: [facility];  Service: Orthopedics;  Laterality: Left;   TOTAL HIP ARTHROPLASTY Right 12/21/2021   Procedure: TOTAL HIP ARTHROPLASTY ANTERIOR APPROACH;  Surgeon: Neil Balls, MD;  Location: [facility];  Service: Orthopedics;  Laterality: Right;   VARICOSE VEIN SURGERY Right    Patient Active Problem List   Diagnosis Date Noted   Alcohol-induced mood disorder with depressive symptoms (HCC) 06/10/2023   Benign prostatic hyperplasia without lower urinary tract symptoms 06/05/2023   Dyslipidemia 06/05/2023   Fall 06/05/2023   History of peripheral neuropathy 06/05/2023   Subdural hematoma (HCC) 06/04/2023   Pain in finger of left hand 03/12/2023   Alcohol abuse 05/31/2022   Disorder of thoracic spine 05/31/2022   COVID-19 virus infection 03/04/2022   Acute bronchitis  03/04/2022   OSA (obstructive sleep apnea) 03/04/2022   PAF (paroxysmal atrial fibrillation) (HCC) 12/13/2021   Anxiety 11/08/2021   Aphasia 11/08/2021   Bipolar 1 disorder (HCC) 11/08/2021   CKD (chronic kidney disease), stage [name] (HCC) 11/08/2021   Expressive aphasia 11/08/2021   Prolonged QT interval 11/08/2021   Recurrent falls 11/08/2021   Primary osteoarthritis of right hip 10/26/2021    Abnormal liver function tests 09/10/2021   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   Urinary hesitancy 03/08/2021   Thoracic aortic aneurysm without rupture (HCC) 03/08/2021   Senile purpura (HCC) 09/07/2020   Aortic atherosclerosis (HCC) 09/07/2020   Sinus bradycardia 03/31/2020   Essential hypertension 03/31/2020   Major depressive disorder 03/07/2020   Essential tremor 03/07/2020   Tremor 01/21/2020   Obesity (BMI 30-39.9) 12/10/2019   Chest pain of uncertain etiology 12/10/2019   Secondary hypercoagulable state (HCC) 09/28/2019   Centrilobular emphysema (HCC) 09/23/2019   Former smoker 09/23/2019   Abnormal findings on diagnostic imaging of lung 09/23/2019   Atrial fibrillation (HCC) 09/23/2019   At risk for sleep apnea 09/23/2019   Shortness of breath 09/23/2019   Nontraumatic incomplete tear of right rotator cuff 10/14/2018   History of adenomatous polyp of colon 03/03/2018   RBBB 06/09/2012   Nephrolithiasis 05/22/2011   Mixed hyperlipidemia 11/20/2009   DDD (degenerative disc disease), cervical 05/13/2008    PCP: Adela Holter, DO REFERRING PROVIDER: Grafton Lawrence, MD REFERRING DIAG: [phone] (ICD-10-CM) - Status post reverse total arthroplasty of left shoulder  THERAPY DIAG:  Acute pain of left shoulder  Muscle weakness (generalized)  Abnormal posture  Localized edema  Rationale for Evaluation and Treatment: Rehabilitation  ONSET DATE: 10/09/23 DATE OF SURGERY  SUBJECTIVE:                                                                                                                                                                                     SUBJECTIVE STATEMENT: "Feel it a little bit this morning."   Hand dominance: Right  PERTINENT HISTORY: H/o a-fib, loop recorder, ETOH use, depression  PAIN:  Are you having pain? Yes: NPRS scale: 1/10 Pain location: Lt shoulder Pain description: sore Aggravating factors: unknown Relieving factors: rest   PRECAUTIONS: Shoulder  and Other: reverse total shoulder replacement  to follow protocol for reverse total shoulder Phase IV 12/18/23 to 01/01/24: Standing AROM, 1-3 lbs weights Phase V: No ROM restrictions  WEIGHT BEARING RESTRICTIONS: Yes < 5 lb per protocol until 01/01/24 and then advance to tolerance    FALLS:  Has patient fallen in last 6 months? Yes. Number of falls 2x in the past 6 months. He has a h/o drinking and felt like  gabapentin  + alcohol led to falls.    LIVING ENVIRONMENT: Lives with: lives with their spouse Lives in: House/apartment Stairs:  2 to garage and 2 to deck in back  OCCUPATION: Retired from [employer]; enjoys repairing things  PATIENT GOALS:reduced pain and improve use of the arm "as close to normal as I can get"  NEXT MD VISIT:  01/04/24  OBJECTIVE:  Note: Objective measures were completed at Evaluation unless otherwise noted.  PATIENT SURVEYS:  FOTO 44 11/04/23: 44%  11/13/23: 58% function  12/04/23: 59% function  01/05/24: 66% function  01/29/24: 77% function   EDEMA: Pocket of edema in distal medial arm, bruising in proximal biceps bruising and edema noted dressing in place  POSTURE: Rounded shoulders and forward head position  UPPER EXTREMITY ROM:    ROM Left 12/13 11/06/23 Left  11/13/23 Left  11/25/23 Left  11/27/23 Left  12/04/23 Left  12/11/23 Left  01/01/24 01/08/24 Left  01/19/24 Left  01/29/24 Left   Shoulder flexion 75 140 PROM  135 PROM 136 AROM  138 AROM 140 AROM 140 AROM sup 145 AROM supine  122 AROM standing  140 AROM standing   Shoulder scaption           120 AROM standing   Shoulder abduction  110 PROM 112 PROM   95 AROM 97 AROM 105 AROM sidelying 105 AROM supine  80 AROM standing   Shoulder adduction             Shoulder internal rotation             Shoulder external rotation 0 32 PROM  42 PROM 45 PROM 56 PROM 30 AROM 36 AROM 40 AROM sup 45 AROM supine   50 AROM supine   Functional crossover reach           Can reach to Rt anterior shoulder  Elbow extension              Wrist flexion             Wrist extension             Wrist ulnar deviation             Wrist radial deviation             Wrist pronation             Wrist supination             (Blank rows = not tested)  UPPER EXTREMITY MMT:  wrist flexion/extension grip strength 35 L and 60 R UNABLE-- will assess once protocol allows MMT Right eval Left 12/04/23 01/29/24 Left  02/18/24 Left   Shoulder flexion  3- 4- in available range   Shoulder extension      Shoulder abduction  3- 4+ in available range   Shoulder adduction      Shoulder internal rotation  Deferred  4- 4  Shoulder external rotation  Deferred  4+ 5  Middle trapezius      Lower trapezius      Elbow flexion      Elbow extension      Wrist flexion      Wrist extension      Wrist ulnar deviation      Wrist radial deviation      Wrist pronation      Wrist supination      Grip strength (lbs)      (Blank rows = not tested) [facility] Adult PT Treatment:  DATE: 02/25/24  Neuromuscular re-ed: Lat pull down 1 x 10 @ 5 lbs, 1 x 10 @ 7.5 lbs (attempted 10 lbs, but discontinued due to pain)  Resisted shoulder IR blue band 2 x 10  Shoulder wall taps 2 x 10  Therapeutic Activity: Shoulder extension with dowel x 10; 5 sec hold  Shoulder IR with dowel x 10; 5 sec hold  Overhead press with dowel x 10  Chest press with dowel 5 lbs 2 x 10  Sled pushing and pulling 3 x 20 ft each    [facility] Adult PT Treatment:                                                DATE: 02/18/24 Therapeutic Exercise: Sidelying shoulder abduction 2 x 10 @ 2 lbs  5 lb kettleball behind the back pass through 2 x 10 CW/CCW  Rows matrix 2 x 10 @ 10 lbs   Therapeutic Activity: Finger ladder flexion, abduction x 5 each  Overhead lifting 2 lb weight from counter to overhead cabinet 2 x 10  Lifting and carrying 15 lb box 4 x 25 ft  Sled pushing 4 x 20 ft  Farmer's carry 5 lb kettlebell 2 laps in clinic  Functional IR  stretch with strap x 10; 5 sec hold     [facility] Adult PT Treatment:                                                DATE: 02/11/24  Manual Therapy: Lt shoulder PROM flexion, ER, IR, abduction with gentle stretching at end range Neuromuscular re-ed: Sidelying ER 2 x 10 @ 2 lbs  Resisted rows at matrix 2  x 10 @ 10 lbs  Therapeutic Activity: Finger ladder flexion, abduction x 5 each   Shoulder extension AAROM with dowel x 10  Standing shoulder abduction 1 lb x 10  Curl to overhead press with dowel 2 x 10       PATIENT EDUCATION: Education details: HEP update  Person educated: Patient  Education method: Explanation, demo, cues, handout Education comprehension: verbalized understanding, returned demo, cues   HOME EXERCISE PROGRAM: Access Code: [email or web address] URL: [email or web address] Date: 02/25/2024 Prepared by: Forrestine Ike  Exercises - Standing Shoulder Posterior Capsule Stretch  - 1 x daily - 7 x weekly - 3 sets - 30 sec  hold - Standing Shoulder Internal Rotation Stretch with Towel  - 1 x daily - 7 x weekly - 1 sets - 10 reps - Seated Shoulder Scaption AAROM with Pulley at Side  - 1 x daily - 7 x weekly - 2 sets - 10 reps - Shoulder Flexion Wall Slide with Towel  - 1 x daily - 7 x weekly - 1 sets - 10 reps - 5 sec  hold - Standing Shoulder Abduction Slides at Wall  - 1 x daily - 7 x weekly - 1 sets - 10 reps - 5 sec  hold - Standing Shoulder Internal Rotation Stretch with Towel  - 1 x daily - 7 x weekly - 1 sets - 10 reps - 5 sec  hold - Standing Shoulder Flexion to 90 Degrees  - 1 x daily - 7 x weekly - 3 sets - 5  reps - Shoulder Abduction with Dumbbells - Palms Down  - 1 x daily - 7 x weekly - 3 sets - 5 reps - Shoulder Taps on Table  - 1 x daily - 7 x weekly - 2 sets - 10 reps - Standing Shoulder Row with Anchored Resistance  - 1 x daily - 3 x weekly - 2 sets - 10 reps - Seated Single Arm Bicep Curls with Rotation and Dumbbell  - 1 x daily - 3 x weekly - 2 sets -  10 reps - Shoulder External Rotation and Scapular Retraction with Resistance  - 1 x daily - 3 x weekly - 2 sets - 10 reps - Supine Shoulder Horizontal Abduction with Resistance  - 1 x daily - 3 x weekly - 2 sets - 10 reps - Shoulder Internal Rotation with Resistance  - 1 x daily - 3 x weekly - 2 sets - 10 reps - Sidelying Shoulder External Rotation  - 1 x daily - 3 x weekly - 2 sets - 10 reps - Supine Shoulder Press AAROM in Abduction with Dowel  - 1 x daily - 3 x weekly - 2 sets - 10 reps  ASSESSMENT:  CLINICAL IMPRESSION: Focused on functional strength and mobility progression of the shoulder, including pushing and pulling activity with good tolerance. With lat pull down he reported sharp pain with 10 lbs, but with weight decreased no reports of shoulder pain. Fatigued with majority of strengthening, but is able to maintain proper form and control.   OBJECTIVE IMPAIRMENTS: decreased activity tolerance, decreased ROM, decreased strength, increased edema, increased fascial restrictions, increased muscle spasms, impaired flexibility, impaired tone, postural dysfunction, and pain.     GOALS: Goals reviewed with patient? Yes  SHORT TERM GOALS: Target date: 11/13/23  The patient will be indep with HEP. Baseline: initiated at eval Goal status: MET  2.  The patient will have PROM documented. Baseline:  began <1 week with elbow and wrist ROM + grip and neck ROM Goal status: MET   LONG TERM GOALS: Target date: 03/13/24  The patient will be indep with HEP. Baseline:  initiated at eval Goal status: progressing   2.  The patient will improve ROM to protocol ranges of flexion to 110-120, ER 30-45. Baseline: not assessed at eval 12/04/23: met PROM, progressing AROM per protocol  Goal status: MET   3.  The patient will tolerate isometrics below shoulder level. Baseline:  not assessed at eval Goal status: MET   4.  The patient will reach into shoulder flexion/scaption to 90 degrees to demo  improved strength against gravity. Baseline: not assessed at eval. 11/13/23: deferred due to post-op restrictions  12/04/23: flexion to 138 in supine  Goal status: MET    5.  The patient will reduce pain to < or equal to 2/10. Baseline:  5-9/10 12/04/23: 6 at worst  Goal status: MET   6.  Pt will improve FOTO to >= 74 Baseline: see above  Goal status: MET   7. Patient will demonstrate at least 4/5 gross Lt shoulder strength to improve ability to lift and carry items.   Baseline: see MMT chart   Goal status: progressing   8. Patient will be able to reach to lateral scapular border with crossover functional reach with LUE to improve ability to complete bathing activity.    Baseline: see ROM chart above   Goal status: NEW PLAN:  PT FREQUENCY: 1x/week  PT DURATION: 6 weeks  PLANNED INTERVENTIONS: 62130- PT Re-evaluation,  97110-Therapeutic exercises, 97530- Therapeutic activity, [id number]- Neuromuscular re-education, 97535- Self Care, 11914- Manual therapy, 97014- Electrical stimulation (unattended), 97016- Vasopneumatic device, Patient/Family education, Taping, Dry Needling, Joint mobilization, Cryotherapy, and Moist heat  PLAN FOR NEXT SESSION: progress LUE ROM and strengthening as tolerated   Ahmaad Neidhardt, PT, DPT, ATC 02/25/24 12:29 PM

## 2024-03-03 ENCOUNTER — Ambulatory Visit

## 2024-03-03 DIAGNOSIS — R293 Abnormal posture: Secondary | ICD-10-CM | POA: Diagnosis not present

## 2024-03-03 DIAGNOSIS — R600 Localized edema: Secondary | ICD-10-CM | POA: Diagnosis not present

## 2024-03-03 DIAGNOSIS — R55 Syncope and collapse: Secondary | ICD-10-CM | POA: Diagnosis not present

## 2024-03-03 DIAGNOSIS — M6281 Muscle weakness (generalized): Secondary | ICD-10-CM

## 2024-03-03 DIAGNOSIS — Z95818 Presence of other cardiac implants and grafts: Secondary | ICD-10-CM | POA: Diagnosis not present

## 2024-03-03 DIAGNOSIS — M25512 Pain in left shoulder: Secondary | ICD-10-CM | POA: Diagnosis not present

## 2024-03-03 NOTE — Therapy (Signed)
 OUTPATIENT PHYSICAL THERAPY SHOULDER TREATMENT   PHYSICAL THERAPY DISCHARGE SUMMARY  Visits from Start of Care: 33  Current functional level related to goals / functional outcomes: All goals met   Remaining deficits: Mild Lt shoulder weakness and ROM    Education / Equipment: See education below    Patient agrees to discharge. Patient goals were met. Patient is being discharged due to meeting the stated rehab goals.   Patient Name: Joshua [name] [facility] MRN: [id number] DOB:1946/06/18, 78 y.o., male Today's Date: 03/03/2024  END OF SESSION:  PT End of Session - 03/03/24 0850     Visit Number 33    Number of Visits 34    Date for PT Re-Evaluation 03/13/24    Authorization Type [employer] medicare    Authorization Time Period 4/3-5/10    Authorization - Visit Number 5    Authorization - Number of Visits 8    Progress Note Due on Visit 38    PT Start Time 0847    PT Stop Time 0928    PT Time Calculation (min) 41 min    Activity Tolerance Patient tolerated treatment well    Behavior During Therapy [facility] for tasks assessed/performed                          Past Medical History:  Diagnosis Date   Abnormal findings on diagnostic imaging of lung 09/23/2019   09/22/2019-lung cancer screening-centrilobular and paraseptal emphysema, calcified granulomata noted bilaterally, no suspicious nodules, lung RADS 1, repeat in 12 months    Anxiety    Atrial fibrillation (HCC) 09/23/2019   Centrilobular emphysema (HCC) 09/23/2019   09/22/2019-lung cancer screening-centrilobular and paraseptal emphysema, calcified granulomata noted bilaterally, no suspicious nodules, lung RADS 1, repeat in 12 months  09/23/2019-FVC 3.74 (81% predicted), postbronchodilator ratio 80, postbronchodilator FEV1 2.88 (86% predicted), no bronchodilator response, DLCO 19.44 (73% predicted)   DDD (degenerative disc disease), cervical 05/13/2008   Cervical Disc Degeneration   Depression    ETOH abuse  10/01/2023   continues to drink 1-3 drinks/ day   History of adenomatous polyp of colon 03/03/2018   05/14/17: Colonoscopy: nonadvanced adenoma, f/u 5 yrs, use SuPrep, Murphy/GAP  Last Assessment & Plan:  Relevant Hx: Course: Daily Update: Today's Plan:   History of kidney stones    Hyperlipidemia    Kidney stone    Nephrolithiasis 05/22/2011   Nontraumatic incomplete tear of right rotator cuff 10/14/2018   Other and unspecified hyperlipidemia 11/20/2009   Hyperlipidemia   RBBB 06/09/2012   Secondary hypercoagulable state (HCC) 09/28/2019   Shortness of breath 09/23/2019   Past Surgical History:  Procedure Laterality Date   CARDIOVERSION N/A 10/15/2019   Procedure: CARDIOVERSION;  Surgeon: Hugh Madura, MD;  Location: [facility] ENDOSCOPY;  Service: Cardiovascular;  Laterality: N/A;   CARDIOVERSION N/A 11/29/2019   Procedure: CARDIOVERSION;  Surgeon: Liza Riggers, MD;  Location: [facility] ENDOSCOPY;  Service: Cardiovascular;  Laterality: N/A;   HEMORRHOID SURGERY     REVERSE SHOULDER ARTHROPLASTY Left 10/09/2023   Procedure: REVERSE SHOULDER ARTHROPLASTY;  Surgeon: Micheline Ahr, MD;  Location: [facility];  Service: Orthopedics;  Laterality: Left;   TOTAL HIP ARTHROPLASTY Right 12/21/2021   Procedure: TOTAL HIP ARTHROPLASTY ANTERIOR APPROACH;  Surgeon: Neil Balls, MD;  Location: [facility] ORS;  Service: Orthopedics;  Laterality: Right;   VARICOSE VEIN SURGERY Right    Patient Active Problem List   Diagnosis Date Noted   Alcohol-induced mood disorder with depressive  symptoms (HCC) 06/10/2023   Benign prostatic hyperplasia without lower urinary tract symptoms 06/05/2023   Dyslipidemia 06/05/2023   Fall 06/05/2023   History of peripheral neuropathy 06/05/2023   Subdural hematoma (HCC) 06/04/2023   Pain in finger of left hand 03/12/2023   Alcohol abuse 05/31/2022   Disorder of thoracic spine 05/31/2022   COVID-19 virus infection 03/04/2022   Acute bronchitis 03/04/2022   OSA  (obstructive sleep apnea) 03/04/2022   PAF (paroxysmal atrial fibrillation) (HCC) 12/13/2021   Anxiety 11/08/2021   Aphasia 11/08/2021   Bipolar 1 disorder (HCC) 11/08/2021   CKD (chronic kidney disease), stage III (HCC) 11/08/2021   Expressive aphasia 11/08/2021   Prolonged QT interval 11/08/2021   Recurrent falls 11/08/2021   Primary osteoarthritis of right hip 10/26/2021   Abnormal liver function tests 09/10/2021   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   Urinary hesitancy 03/08/2021   Thoracic aortic aneurysm without rupture (HCC) 03/08/2021   Senile purpura (HCC) 09/07/2020   Aortic atherosclerosis (HCC) 09/07/2020   Sinus bradycardia 03/31/2020   Essential hypertension 03/31/2020   Major depressive disorder 03/07/2020   Essential tremor 03/07/2020   Tremor 01/21/2020   Obesity (BMI 30-39.9) 12/10/2019   Chest pain of uncertain etiology 12/10/2019   Secondary hypercoagulable state (HCC) 09/28/2019   Centrilobular emphysema (HCC) 09/23/2019   Former smoker 09/23/2019   Abnormal findings on diagnostic imaging of lung 09/23/2019   Atrial fibrillation (HCC) 09/23/2019   At risk for sleep apnea 09/23/2019   Shortness of breath 09/23/2019   Nontraumatic incomplete tear of right rotator cuff 10/14/2018   History of adenomatous polyp of colon 03/03/2018   RBBB 06/09/2012   Nephrolithiasis 05/22/2011   Mixed hyperlipidemia 11/20/2009   DDD (degenerative disc disease), cervical 05/13/2008    PCP: Adela Holter, DO REFERRING PROVIDER: Grafton Lawrence, MD REFERRING DIAG: [phone] (ICD-10-CM) - Status post reverse total arthroplasty of left shoulder  THERAPY DIAG:  Acute pain of left shoulder  Muscle weakness (generalized)  Abnormal posture  Localized edema  Rationale for Evaluation and Treatment: Rehabilitation  ONSET DATE: 10/09/23 DATE OF SURGERY  SUBJECTIVE:                                                                                                                                                                                      SUBJECTIVE STATEMENT: "Feels pretty good." Shoulder is a little sore right now from lifting and carrying fire wood yesterday.   Hand dominance: Right  PERTINENT HISTORY: H/o a-fib, loop recorder, ETOH use, depression  PAIN:  Are you having pain? Yes: NPRS scale: 1-2/10 Pain location: Lt shoulder Pain description: sore Aggravating factors: unknown Relieving factors: rest   PRECAUTIONS: Shoulder  and Other: reverse total shoulder replacement  to follow protocol for reverse total shoulder Phase IV 12/18/23 to 01/01/24: Standing AROM, 1-3 lbs weights Phase V: No ROM restrictions  WEIGHT BEARING RESTRICTIONS: Yes < 5 lb per protocol until 01/01/24 and then advance to tolerance    FALLS:  Has patient fallen in last 6 months? Yes. Number of falls 2x in the past 6 months. He has a h/o drinking and felt like gabapentin  + alcohol led to falls.    LIVING ENVIRONMENT: Lives with: lives with their spouse Lives in: House/apartment Stairs:  2 to garage and 2 to deck in back  OCCUPATION: Retired from [employer]; enjoys repairing things  PATIENT GOALS:reduced pain and improve use of the arm "as close to normal as I can get"    OBJECTIVE:  Note: Objective measures were completed at Evaluation unless otherwise noted.  PATIENT SURVEYS:  FOTO 44 11/04/23: 44%  11/13/23: 58% function  12/04/23: 59% function  01/05/24: 66% function  01/29/24: 77% function   EDEMA: Pocket of edema in distal medial arm, bruising in proximal biceps bruising and edema noted dressing in place  POSTURE: Rounded shoulders and forward head position  UPPER EXTREMITY ROM:    ROM Left 12/13 11/06/23 Left  11/13/23 Left  11/25/23 Left  11/27/23 Left  12/04/23 Left  12/11/23 Left  01/01/24 01/08/24 Left  01/19/24 Left  01/29/24 Left  03/03/24 Left   Shoulder flexion 75 140 PROM  135 PROM 136 AROM  138 AROM 140 AROM 140 AROM sup 145 AROM supine  122 AROM standing  140 AROM standing  145  standing AROM  Shoulder scaption           120 AROM standing    Shoulder abduction  110 PROM 112 PROM   95 AROM 97 AROM 105 AROM sidelying 105 AROM supine  80 AROM standing  120 AROM standing   Shoulder adduction              Shoulder internal rotation            70 AROM supine   Shoulder external rotation 0 32 PROM  42 PROM 45 PROM 56 PROM 30 AROM 36 AROM 40 AROM sup 45 AROM supine   50 AROM supine  60 AROM supine   Functional crossover reach           Can reach to Rt anterior shoulder Can reach to lateral scapular border   Elbow extension              Wrist flexion              Wrist extension              Wrist ulnar deviation              Wrist radial deviation              Wrist pronation              Wrist supination              (Blank rows = not tested)  UPPER EXTREMITY MMT:  wrist flexion/extension grip strength 35 L and 60 R UNABLE-- will assess once protocol allows MMT Right eval Left 12/04/23 01/29/24 Left  02/18/24 Left  03/03/24 Left   Shoulder flexion  3- 4- in available range  4 in available range   Shoulder extension       Shoulder abduction  3- 4+ in available range  5  in available range  Shoulder adduction       Shoulder internal rotation  Deferred  4- 4 4+  Shoulder external rotation  Deferred  4+ 5 5  Middle trapezius       Lower trapezius       Elbow flexion       Elbow extension       Wrist flexion       Wrist extension       Wrist ulnar deviation       Wrist radial deviation       Wrist pronation       Wrist supination       Grip strength (lbs)       (Blank rows = not tested) [facility] Adult PT Treatment:                                                DATE: 03/03/24 Therapeutic Exercise: Reviewed and update HEP discussing frequency, sets, reps and ways to progress independently.   Neuromuscular re-ed: Supine resisted horizontal shoulder abduction green band 2 x 10  Rows black band 2 x 10  Shoulder ER green band 2 x 10  Therapeutic  Activity: Re-assessment to determine overall progress, educating patient on progress towards goals.  Resisted shoulder flexion 2 x 10 @ 2 lbs Resisted shoulder abduction 3 x 5 @ 2 lbs    [facility] Adult PT Treatment:                                                DATE: 02/25/24  Neuromuscular re-ed: Lat pull down 1 x 10 @ 5 lbs, 1 x 10 @ 7.5 lbs (attempted 10 lbs, but discontinued due to pain)  Resisted shoulder IR blue band 2 x 10  Shoulder wall taps 2 x 10  Therapeutic Activity: Shoulder extension with dowel x 10; 5 sec hold  Shoulder IR with dowel x 10; 5 sec hold  Overhead press with dowel x 10  Chest press with dowel 5 lbs 2 x 10  Sled pushing and pulling 3 x 20 ft each    [facility] Adult PT Treatment:                                                DATE: 02/18/24 Therapeutic Exercise: Sidelying shoulder abduction 2 x 10 @ 2 lbs  5 lb kettleball behind the back pass through 2 x 10 CW/CCW  Rows matrix 2 x 10 @ 10 lbs   Therapeutic Activity: Finger ladder flexion, abduction x 5 each  Overhead lifting 2 lb weight from counter to overhead cabinet 2 x 10  Lifting and carrying 15 lb box 4 x 25 ft  Sled pushing 4 x 20 ft  Farmer's carry 5 lb kettlebell 2 laps in clinic  Functional IR stretch with strap x 10; 5 sec hold        PATIENT EDUCATION: Education details: HEP update; d/c education  Person educated: Patient  Education method: Explanation, demo, cues, handout Education comprehension: verbalized understanding, returned demo   HOME EXERCISE PROGRAM: Access Code: [email or web address] URL:  [email or web address] Date: 03/03/2024 Prepared by: Forrestine Ike  Exercises - Standing Shoulder Posterior Capsule Stretch  - 1 x daily - 7 x weekly - 3 sets - 30 sec  hold - Standing Shoulder Internal Rotation Stretch with Towel  - 1 x daily - 7 x weekly - 1 sets - 10 reps - Seated Shoulder Scaption AAROM with Pulley at Side  - 1 x daily - 7 x weekly - 2 sets - 10 reps - Shoulder  Flexion Wall Slide with Towel  - 1 x daily - 7 x weekly - 1 sets - 10 reps - 5 sec  hold - Standing Shoulder Abduction Slides at Wall  - 1 x daily - 7 x weekly - 1 sets - 10 reps - 5 sec  hold - Shoulder Abduction with Dumbbells - Palms Down  - 1 x daily - 3 x weekly - 3 sets - 5 reps - Standing Shoulder Flexion to 90 Degrees with Dumbbells  - 1 x daily - 3 x weekly - 2 sets - 10 reps - Shoulder Taps on Table  - 1 x daily - 3 x weekly - 2 sets - 10 reps - Standing Shoulder Row with Anchored Resistance  - 1 x daily - 3 x weekly - 2 sets - 10 reps - Seated Single Arm Bicep Curls with Rotation and Dumbbell  - 1 x daily - 3 x weekly - 2 sets - 10 reps - Shoulder External Rotation and Scapular Retraction with Resistance  - 1 x daily - 3 x weekly - 2 sets - 10 reps - Supine Shoulder Horizontal Abduction with Resistance  - 1 x daily - 3 x weekly - 2 sets - 10 reps - Shoulder Internal Rotation with Resistance  - 1 x daily - 3 x weekly - 2 sets - 10 reps - Supine Shoulder Press AAROM in Abduction with Dowel  - 1 x daily - 3 x weekly - 2 sets - 10 reps  ASSESSMENT:  CLINICAL IMPRESSION: Casius has made excellent progress in PT s/p Lt RTSA on 10/09/23. He demonstrates functional Lt shoulder AROM and overall improvements in shoulder strength. He has met all established functional goals at this time. He demonstrates independence with advanced home program to further progress his strength and ROM independently. He is therefore appropriate for discharge at this time with patient in agreement with this plan.   OBJECTIVE IMPAIRMENTS: decreased activity tolerance, decreased ROM, decreased strength, increased edema, increased fascial restrictions, increased muscle spasms, impaired flexibility, impaired tone, postural dysfunction, and pain.     GOALS: Goals reviewed with patient? Yes  SHORT TERM GOALS: Target date: 11/13/23  The patient will be indep with HEP. Baseline: initiated at eval Goal status: MET  2.   The patient will have PROM documented. Baseline:  began <1 week with elbow and wrist ROM + grip and neck ROM Goal status: MET   LONG TERM GOALS: Target date: 03/13/24  The patient will be indep with HEP. Baseline:  initiated at eval Goal status: MET   2.  The patient will improve ROM to protocol ranges of flexion to 110-120, ER 30-45. Baseline: not assessed at eval 12/04/23: met PROM, progressing AROM per protocol  Goal status: MET   3.  The patient will tolerate isometrics below shoulder level. Baseline:  not assessed at eval Goal status: MET   4.  The patient will reach into shoulder flexion/scaption to 90 degrees to demo improved strength against gravity. Baseline: not assessed at eval.  11/13/23: deferred due to post-op restrictions  12/04/23: flexion to 138 in supine  Goal status: MET    5.  The patient will reduce pain to < or equal to 2/10. Baseline:  5-9/10 12/04/23: 6 at worst  Goal status: MET   6.  Pt will improve FOTO to >= 74 Baseline: see above  Goal status: MET   7. Patient will demonstrate at least 4/5 gross Lt shoulder strength to improve ability to lift and carry items.   Baseline: see MMT chart   Goal status: MET   8. Patient will be able to reach to lateral scapular border with crossover functional reach with LUE to improve ability to complete bathing activity.    Baseline: see ROM chart above   Goal status: MET PLAN:  PT FREQUENCY: 1x/week  PT DURATION: 6 weeks  PLANNED INTERVENTIONS: 97164- PT Re-evaluation, 97110-Therapeutic exercises, 97530- Therapeutic activity, 97112- Neuromuscular re-education, 97535- Self Care, 29562- Manual therapy, 97014- Electrical stimulation (unattended), 97016- Vasopneumatic device, Patient/Family education, Taping, Dry Needling, Joint mobilization, Cryotherapy, and Moist heat  PLAN FOR NEXT SESSION: n/a d/c   Forrestine Ike, PT, DPT, ATC 03/03/24 9:34 AM

## 2024-03-16 DIAGNOSIS — I480 Paroxysmal atrial fibrillation: Secondary | ICD-10-CM | POA: Diagnosis not present

## 2024-03-16 DIAGNOSIS — J439 Emphysema, unspecified: Secondary | ICD-10-CM | POA: Diagnosis not present

## 2024-03-16 DIAGNOSIS — F319 Bipolar disorder, unspecified: Secondary | ICD-10-CM | POA: Diagnosis not present

## 2024-03-16 DIAGNOSIS — Z4509 Encounter for adjustment and management of other cardiac device: Secondary | ICD-10-CM | POA: Diagnosis not present

## 2024-03-16 DIAGNOSIS — R001 Bradycardia, unspecified: Secondary | ICD-10-CM | POA: Diagnosis not present

## 2024-03-16 DIAGNOSIS — R55 Syncope and collapse: Secondary | ICD-10-CM | POA: Diagnosis not present

## 2024-04-08 DIAGNOSIS — R55 Syncope and collapse: Secondary | ICD-10-CM | POA: Diagnosis not present

## 2024-04-08 DIAGNOSIS — Z95818 Presence of other cardiac implants and grafts: Secondary | ICD-10-CM | POA: Diagnosis not present

## 2024-05-12 DIAGNOSIS — R55 Syncope and collapse: Secondary | ICD-10-CM | POA: Diagnosis not present

## 2024-05-12 DIAGNOSIS — Z95818 Presence of other cardiac implants and grafts: Secondary | ICD-10-CM | POA: Diagnosis not present

## 2024-05-18 DIAGNOSIS — L218 Other seborrheic dermatitis: Secondary | ICD-10-CM | POA: Diagnosis not present

## 2024-06-16 DIAGNOSIS — R55 Syncope and collapse: Secondary | ICD-10-CM | POA: Diagnosis not present

## 2024-06-16 DIAGNOSIS — Z95818 Presence of other cardiac implants and grafts: Secondary | ICD-10-CM | POA: Diagnosis not present

## 2024-06-17 ENCOUNTER — Other Ambulatory Visit: Payer: Self-pay | Admitting: Cardiology

## 2024-06-21 ENCOUNTER — Other Ambulatory Visit: Payer: Self-pay | Admitting: Cardiology

## 2024-07-06 ENCOUNTER — Encounter: Payer: Self-pay | Admitting: Sports Medicine

## 2024-07-21 DIAGNOSIS — Z95818 Presence of other cardiac implants and grafts: Secondary | ICD-10-CM | POA: Diagnosis not present

## 2024-07-21 DIAGNOSIS — R55 Syncope and collapse: Secondary | ICD-10-CM | POA: Diagnosis not present

## 2024-07-23 ENCOUNTER — Other Ambulatory Visit: Payer: Self-pay | Admitting: Cardiology

## 2024-07-29 DIAGNOSIS — I480 Paroxysmal atrial fibrillation: Secondary | ICD-10-CM | POA: Diagnosis not present

## 2024-08-21 DIAGNOSIS — R55 Syncope and collapse: Secondary | ICD-10-CM | POA: Diagnosis not present

## 2024-08-21 DIAGNOSIS — Z95818 Presence of other cardiac implants and grafts: Secondary | ICD-10-CM | POA: Diagnosis not present

## 2024-09-21 DIAGNOSIS — I480 Paroxysmal atrial fibrillation: Secondary | ICD-10-CM | POA: Diagnosis not present

## 2024-09-21 DIAGNOSIS — E785 Hyperlipidemia, unspecified: Secondary | ICD-10-CM | POA: Diagnosis not present

## 2024-09-21 NOTE — Progress Notes (Signed)
 Outpatient Cardiology progress note  HPI    History of Present Illness:  Joshua [name] is a 78 y.o. male who presents for follow-up  Hospital HPI: He was walking outside and suddenly felt weak and dizzy, lost his balance and fell.  He got up, rested and attempted to walk, symptoms recurred and he fell again.  He had a laceration on his earlobe, abrasions to his bilateral knees.  He has a history of paroxysmal atrial fibrillation and was on amiodarone , metoprolol , Eliquis  for anticoagulation. On admission EKG demonstrated sinus bradycardia with a rate of 38.  Heart during his hospitalization was generally between 50 and 55.  Troponins were mildly elevated, flat 25, 23 and 23. He has not had chest pain or exertional dyspnea. Amiodarone  and metoprolol  were held.   He has been following with a cardiologist at [facility] in [place].  He has had 2 failed cardioversions in 2021.  Has been on amiodarone  since then.  Echocardiogram in 2020 demonstrated normal LV function, no mention of valvular abnormalities.  Ascending aorta is very mildly dilated 4.1 cm. He had a CT angiogram in 2021 that demonstrated coronary artery calcium  score of 459.  CT FFR testing was negative for hemodynamically significant lesions.   Previously seen for bradycardia heart rate is in the mid to upper 30s at rest.  He was asymptomatic.  He exercises at the [employer], walking on the track and doing weight machines several days per week.  He does not feel dyspnea, exercise intolerance although he does admit his intensity level is low.    7-day cardiac monitor in August 2024 demonstrated many episodes of supraventricular tachycardia, most of irregular, fastest was 134 bpm.  Suspect some of it may have been atrial fibrillation given his history, but unclear.  Longest episode was an hour and 26 minutes.  Patient did not feel palpitations or was not aware of symptoms during these episodes.  No offset pauses were noted.  He also had 2%  multifocal PVCs  Referral to EP, Dr. Margarite recommended implantable loop recorder for further monitoring/data collection. He has had 1 known episode of tachycardia/palpitations since implant.   History of Present Illness  The patient is a 78 year old male presenting for follow-up. Prior electrophysiology notes have been reviewed. He has ongoing intermittent device-detected atrial fibrillation, which is asymptomatic with no long pauses. Heart rate is stable, and he continues on a beta blocker. He is on DOAC for stroke prevention.  He reports no cardiac-related issues, including palpitations or irregular heartbeats. He continues to use his loop recorder and has expressed interest in the Watchman device. He is currently on metoprolol  25 mg and Eliquis  twice daily for stroke prevention due to  atrial fibrillation. He is also taking Lasix  daily.  He experiences leg pain that originates from his hip or back and radiates downwards, causing discomfort during sleep. The pain is more pronounced when he is lying down. He also reports occasional swelling in both legs. He has [facility], which he uses intermittently and finds beneficial. He has a history of hip replacement but notes that the current pain is different from what he experienced post-surgery.  He has a prescription for hydroxyzine to be taken as needed for anxiety, which he finds helpful. However, it induces fatigue. He has discontinued naltrexone and started a new medication approximately 2 months ago, the name of which he cannot recall.  He has a bump on his shin, present for 2  years, which was evaluated in [place] and deemed non-threatening. The bump developed after he injured his shin while climbing onto a picnic table.  PAST SURGICAL HISTORY: Hip replacement  SOCIAL HISTORY - Alcohol use disorder: Currently on medication prescribed by psychiatrist at the [facility].      Exam  Heart Rate:  [102] 102 BP:  (120)/(66) 120/66 SpO2:  [96 %] 96 %  Review of Systems  Constitutional:  Negative for chills and fever.  Respiratory:  Negative for chest tightness and shortness of breath.   Cardiovascular:  Negative for chest pain, palpitations and leg swelling.  Musculoskeletal:  Positive for myalgias. Negative for arthralgias and joint swelling.  Neurological:  Negative for dizziness, syncope and light-headedness.    Physical Exam Constitutional:      Appearance: Normal appearance.  HENT:     Head: Normocephalic and atraumatic.  Cardiovascular:     Rate and Rhythm: Normal rate and regular rhythm.     Heart sounds: No murmur heard.    No gallop.  Musculoskeletal:     Right lower leg: No edema.     Left lower leg: No edema.  Pulmonary:     Effort: Pulmonary effort is normal.     Breath sounds: No wheezing.  Skin:    General: Skin is warm and dry.  Neurological:     Mental Status: He is alert.      Assessment/Plan   I have personally reviewed the patients active medications. I personally reviewed prior EP notes   I have reviewed the following:   Results - Laboratory Studies:   - Creatinine: 1.35   - Potassium: 4.0     Assessment: Paroxysmal atrial fibrillation Regular rate and rhythm. Continue Eliquis  for primary stroke prevention No rate control, history of bradycardia   Bradycardia Sinus node dysfunction Stable, has implantable loop recorder which has not shown any significant pauses or problematic bradycardia events.  No more syncope.   Hyperlipidemia: Continue statin   47-month follow-up Results  ECG: No results found.  Echo: No results found.    Electronically Signed: Redell DELENA Montenegro, MD 09/21/2024 3:33 PM
